# Patient Record
Sex: Female | Born: 1940
Health system: Southern US, Community
[De-identification: ages and names within clinical notes are randomized; demographics above are authoritative.]

## PROBLEM LIST (undated history)

## (undated) DIAGNOSIS — E119 Type 2 diabetes mellitus without complications: Secondary | ICD-10-CM

## (undated) DIAGNOSIS — E109 Type 1 diabetes mellitus without complications: Secondary | ICD-10-CM

## (undated) DIAGNOSIS — I499 Cardiac arrhythmia, unspecified: Secondary | ICD-10-CM

## (undated) DIAGNOSIS — I700 Atherosclerosis of aorta: Secondary | ICD-10-CM

## (undated) DIAGNOSIS — R011 Cardiac murmur, unspecified: Secondary | ICD-10-CM

## (undated) DIAGNOSIS — N183 Chronic kidney disease, stage 3 unspecified: Secondary | ICD-10-CM

## (undated) DIAGNOSIS — F0390 Unspecified dementia without behavioral disturbance: Secondary | ICD-10-CM

## (undated) DIAGNOSIS — M1990 Unspecified osteoarthritis, unspecified site: Secondary | ICD-10-CM

## (undated) DIAGNOSIS — I10 Essential (primary) hypertension: Secondary | ICD-10-CM

## (undated) HISTORY — PX: EYE SURGERY: SHX253

## (undated) HISTORY — PX: BREAST EXCISIONAL BIOPSY: SUR124

## (undated) NOTE — *Deleted (*Deleted)
Chronic Care Management Pharmacy  Name: [name]  MRN: [id number] DOB: [birth year]  Chief Complaint/ HPI  [name],  89 y.o. , female presents for their Follow-Up CCM visit with the clinical pharmacist via telephone due to COVID-19 Pandemic.  PCP : Alba Cory, MD  Their chronic conditions include: Hypertension, Hyperlipidemia, Diabetes, and Osteoporosis  Office Visits:NA  Consult Visit: 10/18/19: Patient presented to Dr. Turner Daniels (Endocrinology) for follow-up. Insulin 70/30 increased to 45 units with breakfast, decreased to 0 units supper. Fasting BG 100s, lunch and supper 200s.   Medications: Outpatient Encounter Medications as of 02/08/2020  Medication Sig Note  . ACCU-CHEK AVIVA PLUS test strip USE 1 STRIP UP TO 4 TIMES DAILY   . alendronate (FOSAMAX) 70 MG tablet TAKE 1 TABLET BY MOUTH EVERY 7 DAYS. TAKE WITH A FULL GLASS OF WATER ON AN EMPTY STOMACH.   Marland Kitchen amLODipine (NORVASC) 5 MG tablet Take 1 tablet (5 mg total) by mouth daily.   Marland Kitchen aspirin EC 81 MG tablet Take 81 mg by mouth daily.   Marland Kitchen atorvastatin (LIPITOR) 80 MG tablet Take 1 tablet (80 mg total) by mouth daily.   . blood glucose meter kit and supplies  09/19/2014: DX: 250.02 DM II Received from: [facility]  . Calcium Carbonate-Vitamin D (CALCIUM-VITAMIN D) 600-125 MG-UNIT TABS Take 1 tablet by mouth daily. 09/19/2014: Received from: [facility] Received Sig: Take by mouth.  . [employer] ALCOHOL SWABS PADS USE 4 TIMES DAILY TO WIPE SKIN BEFORE USING INSULIN   . Glucagon, rDNA, (GLUCAGON EMERGENCY) 1 MG KIT Inject 1 kit as directed daily as needed.   . hydrALAZINE (APRESOLINE) 10 MG tablet TAKE 1 TABLET (10 MG TOTAL) BY MOUTH 3 (THREE) TIMES DAILY AS NEEDED. BP ABOVE 150/90   . insulin aspart protamine- aspart (NOVOLOG MIX 70/30) (70-30) 100 UNIT/ML injection 45 Units daily with breakfast. Pt taking 30 units in AM and 10 units before bed only per Dr. Justin Mend 09/26/2014: Received from:  [facility]  . losartan-hydrochlorothiazide (HYZAAR) 100-12.5 MG tablet TAKE 1 TABLET BY MOUTH EVERY DAY   . ondansetron (ZOFRAN ODT) 4 MG disintegrating tablet Take 1 tablet (4 mg total) by mouth every 8 (eight) hours as needed for nausea or vomiting.    No facility-administered encounter medications on file as of 02/08/2020.     Financial Resource Strain:   . Difficulty of Paying Living Expenses: Not on file     Current Diagnosis/Assessment:  Goals Addressed   None    Hypertension   BP goal is:  {CHL HP UPSTREAM Pharmacist BP ranges:[phone]}  Office blood pressures are  BP Readings from Last 3 Encounters:  09/08/19 138/68  08/02/19 (!) 150/70  05/15/19 (!) 160/90   Patient checks BP at home {CHL HP BP Monitoring Frequency:[phone]} Patient home BP readings are ranging: ***  Patient has failed these meds in the past: *** Patient is currently {CHL Controlled/Uncontrolled:[phone]} on the following medications:  . Amlodipine 5 mg daily  . Hydralazine 10 mg TID PRN  . Losartan 100-12.5 mg daily   We discussed {CHL HP Upstream Pharmacy [phone]}  Plan  Continue {CHL HP Upstream Pharmacy Plans:[phone]}   Hyperlipidemia   LDL goal < 70  Last lipids Lab Results  Component Value Date   CHOL 154 09/08/2019   HDL 47 (L) 09/08/2019   LDLCALC 88 09/08/2019   TRIG 96 09/08/2019   CHOLHDL 3.3 09/08/2019   Hepatic Function Latest Ref Rng & Units 09/08/2019 05/15/2019 11/15/2018  Total  Protein 6.1 - 8.1 g/dL 7.3 7.4 7.2  Albumin 3.6 - 5.1 g/dL - - -  AST 10 - 35 U/L 20 27 24   ALT 6 - 29 U/L 19 23 23   Alk Phosphatase 33 - 130 U/L - - -  Total Bilirubin 0.2 - 1.2 mg/dL 0.6 0.6 0.8     The 40-JWJX ASCVD risk score Denman George DC Jr., et al., 2013) is: 19.8%   Values used to calculate the score:     Age: 20 years     Sex: Female     Is Non-Hispanic African American: Yes     Diabetic: Yes     Tobacco smoker: No     Systolic Blood  Pressure: 102 mmHg     Is BP treated: Yes     HDL Cholesterol: 47 mg/dL     Total Cholesterol: 154 mg/dL   Patient has failed these meds in past: *** Patient is currently {CHL Controlled/Uncontrolled:[phone]} on the following medications:  . Aspirin 81 mg daily  . Atorvastatin 80 mg daily   We discussed:  {CHL HP Upstream Pharmacy discussion:[phone]}  Plan  Continue {CHL HP Upstream Pharmacy [phone]}  Osteoporosis   Last DEXA   Vit D, 25-Hydroxy  Date Value Ref Range Status  09/08/2019 27 (L) 30 - 100 ng/mL Final    Comment:    Vitamin D Status         25-OH Vitamin D: . Deficiency:                    <20 ng/mL Insufficiency:             20 - 29 ng/mL Optimal:                 > or = 30 ng/mL . For 25-OH Vitamin D testing on patients on  D2-supplementation and patients for whom quantitation  of D2 and D3 fractions is required, the QuestAssureD(TM) 25-OH VIT D, (D2,D3), LC/MS/MS is recommended: order  code 91478 (patients >73yrs). See Note 1 . Note 1 . For additional information, please refer to  http://education.Quest[facility].com/faq/FAQ199  (This link is being provided for informational/ educational purposes only.)      Patient is a candidate for pharmacologic treatment due to T-Score < -2.5 in femoral neck  Patient has failed these meds in past: NA Patient is currently controlled on the following medications:  . Fosamax 70mg  weekly (restarted Oct 2019)  . Calcium carbonate +D3 600 mg- 125 units daily   We discussed: Dexa Ordered   Plan  Continue current medications  Diabetes   Recent Relevant Labs: Lab Results  Component Value Date/Time   HGBA1C 8.5 (A) 05/15/2019 01:38 PM   HGBA1C 7.6 (A) 11/15/2018 01:35 PM   HGBA1C 8.5 (A) 04/15/2018 01:22 PM   HGBA1C 9.9 12/15/2017 12:00 AM   HGBA1C 10.6 (A) 04/25/2014 12:00 AM   HGBA1C 14.0 (H) 02/26/2013 10:50 AM   HGBA1C 10.6 (H) 01/23/2012 05:35 PM   MICROALBUR 1.8 03/14/2018 04:53 PM    MICROALBUR 0.8 10/13/2016 09:36 AM    Patient is currently uncontrolled on the following medications: Novolog 70/30 mix  Last diabetic Foot exam:  Lab Results  Component Value Date/Time   HMDIABEYEEXA Retinopathy (A) 02/01/2019 12:00 AM    Last diabetic Eye exam: No results found for: HMDIABFOOTEX   We discussed:    Plan  Continue current medications  Misc / OTC     . Ondanestron ODT 4 mg q8hr PRN   We  discussed:  ***  Plan  Continue {CHL HP Upstream Pharmacy ZOXWR:[id number]}

---

## 1968-03-09 HISTORY — PX: BREAST SURGERY: SHX581

## 2005-02-03 ENCOUNTER — Ambulatory Visit: Payer: Self-pay

## 2006-03-24 ENCOUNTER — Ambulatory Visit: Payer: Self-pay | Admitting: Family Medicine

## 2006-04-13 ENCOUNTER — Ambulatory Visit: Payer: Self-pay | Admitting: Family Medicine

## 2006-06-29 ENCOUNTER — Ambulatory Visit: Payer: Self-pay | Admitting: Family Medicine

## 2006-12-16 ENCOUNTER — Ambulatory Visit: Payer: Self-pay | Admitting: Family Medicine

## 2007-02-24 DIAGNOSIS — E559 Vitamin D deficiency, unspecified: Secondary | ICD-10-CM

## 2008-05-18 IMAGING — US US PELV - US TRANSVAGINAL
1 series · 17 of 25 positions shown · non-contrast
Comparison: none

REASON FOR EXAM: mass on left ovary
COMMENTS:

[Series 1: us pelv - us transvaginal · 17 of 37 slices shown]
[im 1/37]
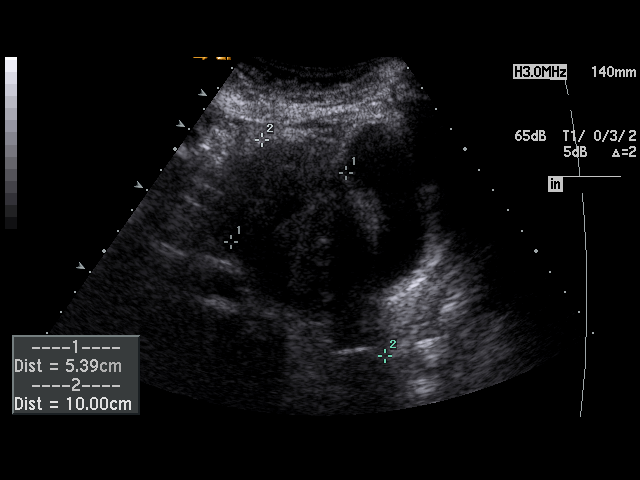
[im 4/37]
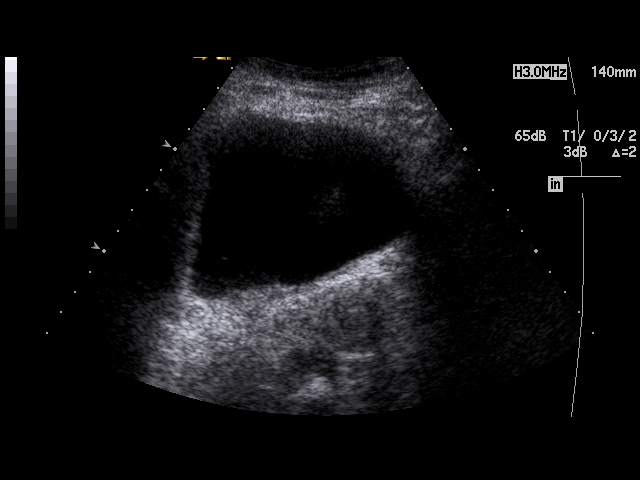
[im 5/37]
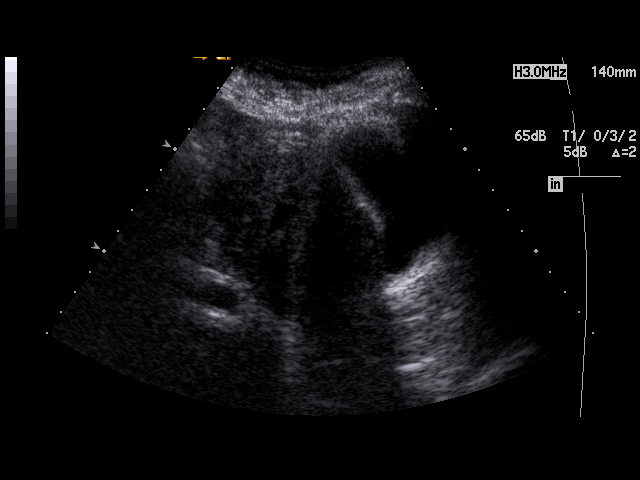
[im 8/37]
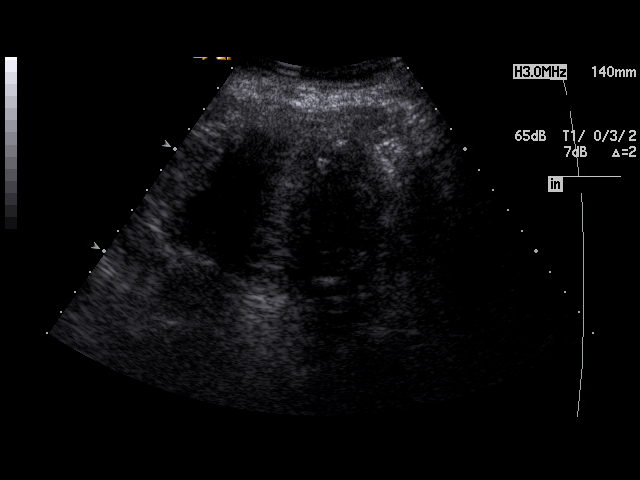
[im 10/37]
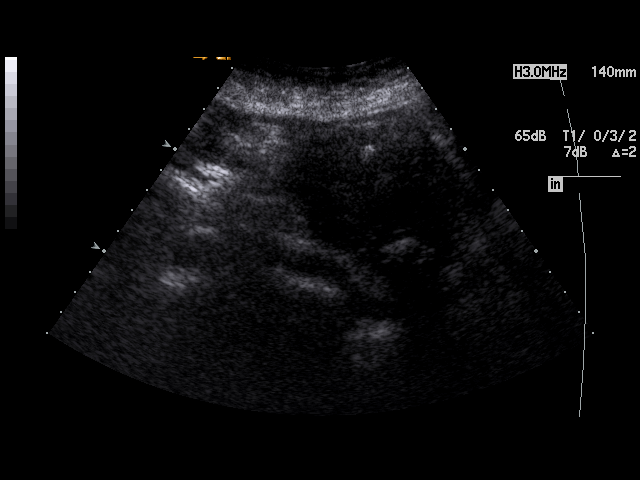
[im 13/37]
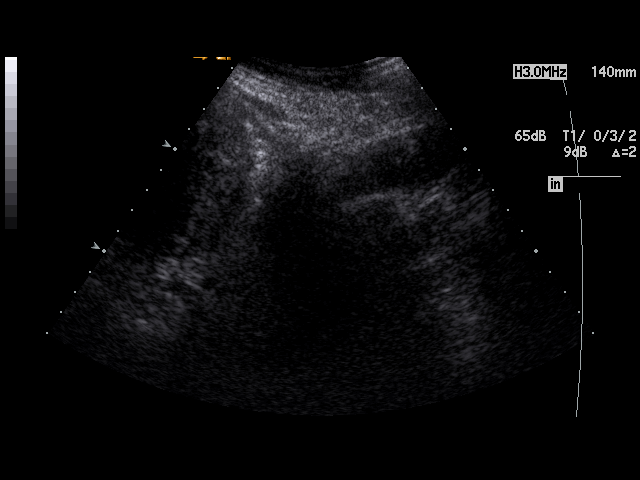
[im 14/37]
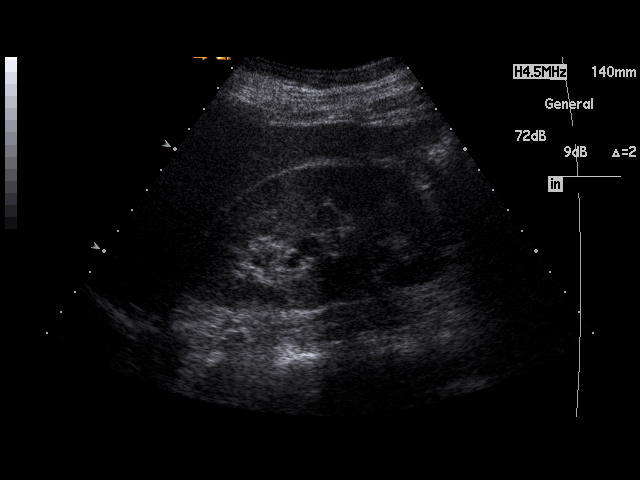
[im 17/37]
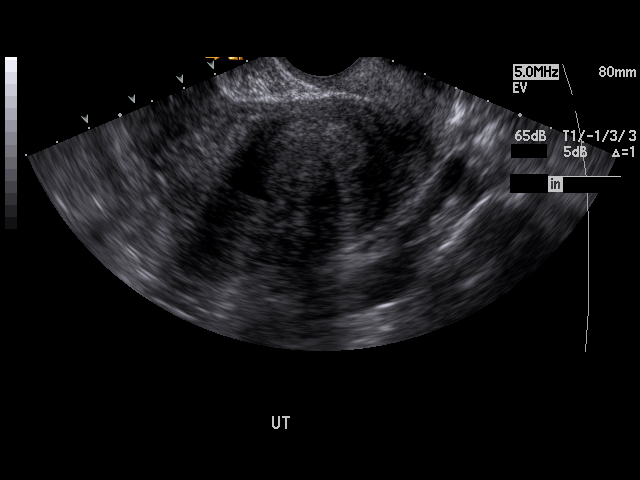
[im 19/37]
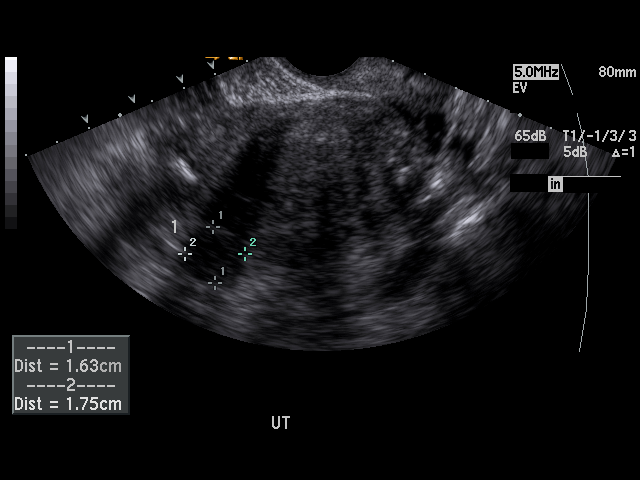
[im 20/37]
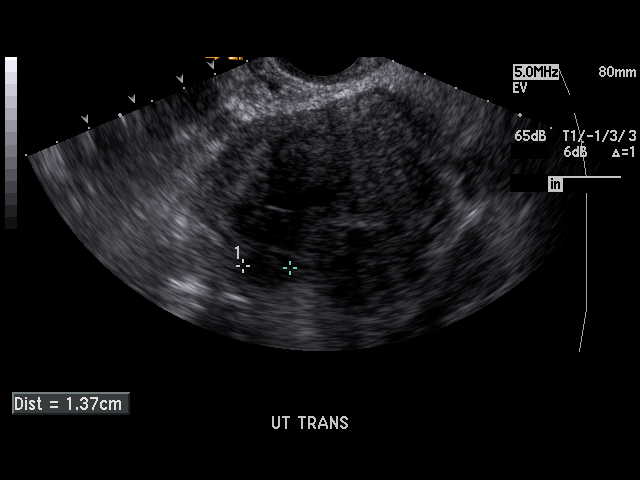
[im 23/37]
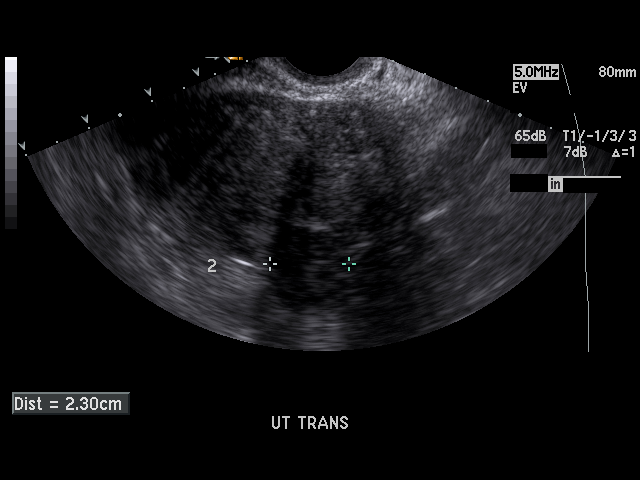
[im 25/37]
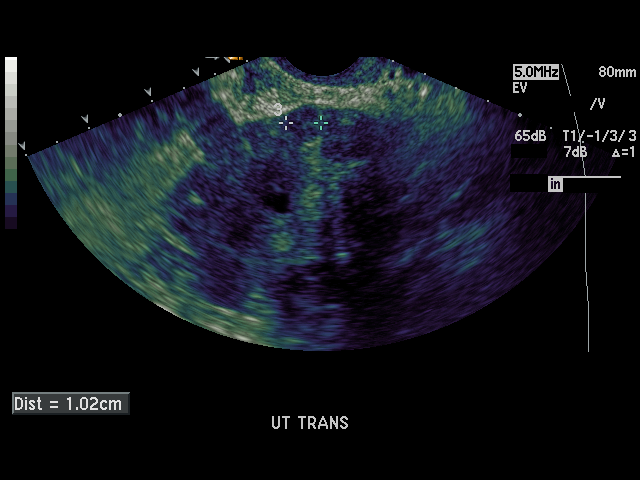
[im 28/37]
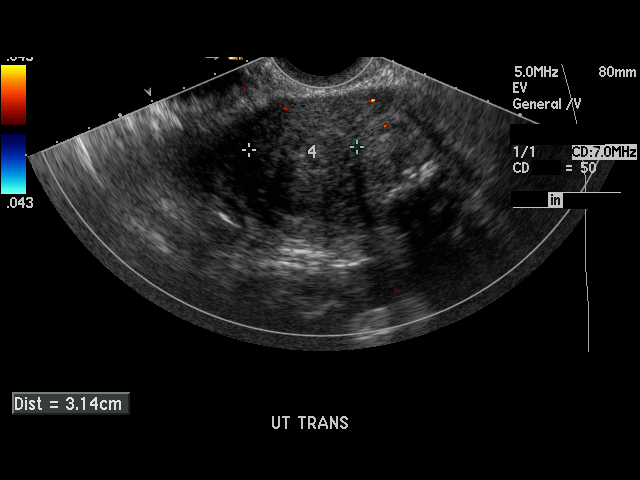
[im 29/37]
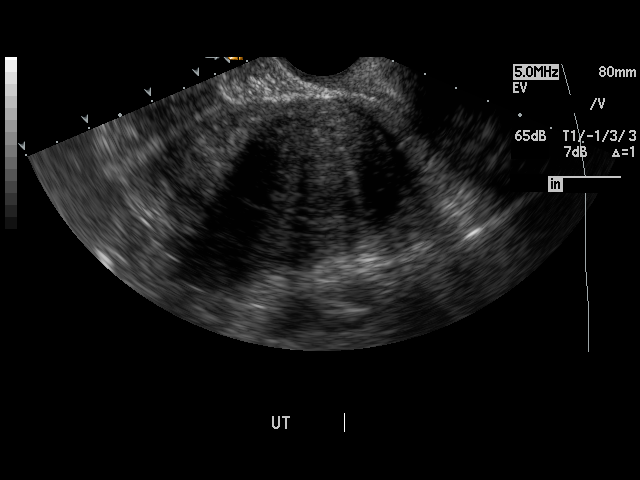
[im 32/37]
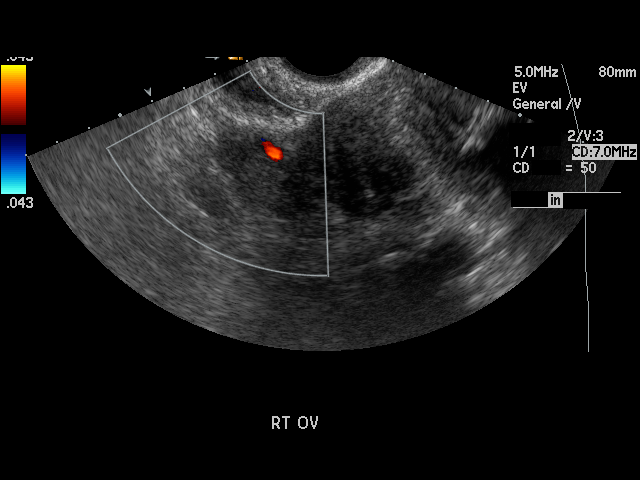
[im 34/37]
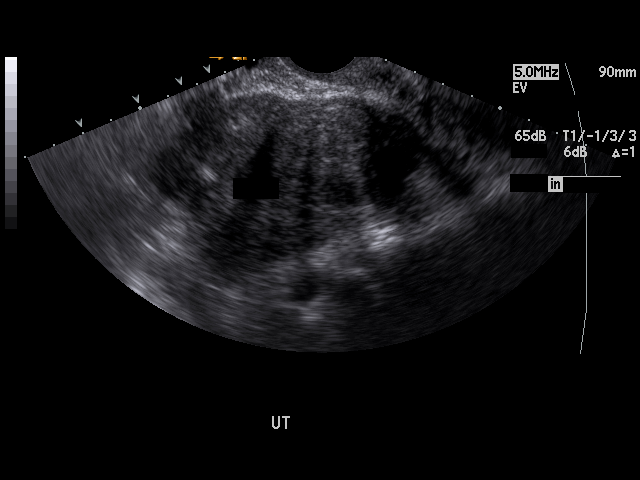
[im 37/37]
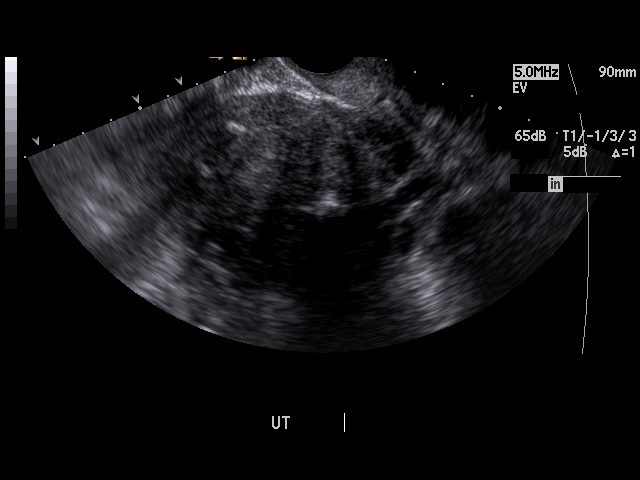

[17 of 25 positions shown; findings below may reference images not displayed]

PROCEDURE:     US  - US PELVIS MASS EXAM  - [DATE] [DATE] [DATE]  [DATE]

RESULT:     Transabdominal and endovaginal ultrasound was performed. The
uterus measures 9.31 cm x 4.73 cm x 6.32 cm.  There are observed at least
four uterine masses consistent with uterine fibroids. The largest measures
3.18 cm in diameter.  The second largest measures 2.74 cm at maximum
diameter and the two smaller lesions measure 1.75 cm and 1.60 cm at maximum
diameters, respectively.  The endometrial stripe is apparently distorted by
the uterine masses and is not definitely identified.  The RIGHT ovary is
visualized and measures 2.78 cm at maximum diameter.  The LEFT ovary is not
seen.  No free fluid is observed in the pelvis.  The kidneys show no
hydronephrosis.
IMPRESSION: 1)There are multiple uterine masses consistent with uterine fibroids as
noted above.

2)The LEFT ovary is not seen.

3)The RIGHT ovary is normal in appearance.

4)No free fluid is observed in the pelvis.

## 2009-02-28 ENCOUNTER — Ambulatory Visit: Payer: Self-pay | Admitting: Ophthalmology

## 2009-03-13 ENCOUNTER — Ambulatory Visit: Payer: Self-pay | Admitting: Ophthalmology

## 2009-04-10 ENCOUNTER — Ambulatory Visit: Payer: Self-pay | Admitting: Family Medicine

## 2009-10-08 ENCOUNTER — Ambulatory Visit: Payer: Self-pay | Admitting: Ophthalmology

## 2009-10-23 ENCOUNTER — Ambulatory Visit: Payer: Self-pay | Admitting: Ophthalmology

## 2010-03-17 ENCOUNTER — Ambulatory Visit: Payer: Self-pay | Admitting: Ophthalmology

## 2010-03-19 ENCOUNTER — Ambulatory Visit: Payer: Self-pay | Admitting: Ophthalmology

## 2010-04-23 LAB — HM DEXA SCAN

## 2010-04-28 ENCOUNTER — Ambulatory Visit: Payer: Self-pay | Admitting: Family Medicine

## 2010-12-17 ENCOUNTER — Ambulatory Visit: Payer: Self-pay | Admitting: Ophthalmology

## 2010-12-23 ENCOUNTER — Ambulatory Visit: Payer: Self-pay | Admitting: Ophthalmology

## 2011-04-23 DIAGNOSIS — I10 Essential (primary) hypertension: Secondary | ICD-10-CM | POA: Diagnosis not present

## 2011-04-23 DIAGNOSIS — E785 Hyperlipidemia, unspecified: Secondary | ICD-10-CM | POA: Diagnosis not present

## 2011-06-22 DIAGNOSIS — E11359 Type 2 diabetes mellitus with proliferative diabetic retinopathy without macular edema: Secondary | ICD-10-CM | POA: Diagnosis not present

## 2011-06-22 DIAGNOSIS — E1139 Type 2 diabetes mellitus with other diabetic ophthalmic complication: Secondary | ICD-10-CM | POA: Diagnosis not present

## 2011-07-15 DIAGNOSIS — E11359 Type 2 diabetes mellitus with proliferative diabetic retinopathy without macular edema: Secondary | ICD-10-CM | POA: Diagnosis not present

## 2011-07-15 DIAGNOSIS — E1139 Type 2 diabetes mellitus with other diabetic ophthalmic complication: Secondary | ICD-10-CM | POA: Diagnosis not present

## 2011-07-22 DIAGNOSIS — E785 Hyperlipidemia, unspecified: Secondary | ICD-10-CM | POA: Diagnosis not present

## 2011-07-22 DIAGNOSIS — I10 Essential (primary) hypertension: Secondary | ICD-10-CM | POA: Diagnosis not present

## 2011-08-04 ENCOUNTER — Ambulatory Visit: Payer: Self-pay | Admitting: Family Medicine

## 2011-08-04 DIAGNOSIS — E119 Type 2 diabetes mellitus without complications: Secondary | ICD-10-CM | POA: Diagnosis not present

## 2011-08-04 DIAGNOSIS — Z7189 Other specified counseling: Secondary | ICD-10-CM | POA: Diagnosis not present

## 2011-08-08 ENCOUNTER — Ambulatory Visit: Payer: Self-pay | Admitting: Family Medicine

## 2011-08-08 DIAGNOSIS — E119 Type 2 diabetes mellitus without complications: Secondary | ICD-10-CM | POA: Diagnosis not present

## 2011-08-08 DIAGNOSIS — Z7189 Other specified counseling: Secondary | ICD-10-CM | POA: Diagnosis not present

## 2011-09-07 ENCOUNTER — Ambulatory Visit: Payer: Self-pay | Admitting: Family Medicine

## 2011-10-29 DIAGNOSIS — M810 Age-related osteoporosis without current pathological fracture: Secondary | ICD-10-CM | POA: Diagnosis not present

## 2011-10-29 DIAGNOSIS — E785 Hyperlipidemia, unspecified: Secondary | ICD-10-CM | POA: Diagnosis not present

## 2011-10-29 DIAGNOSIS — I10 Essential (primary) hypertension: Secondary | ICD-10-CM | POA: Diagnosis not present

## 2011-12-21 DIAGNOSIS — E11359 Type 2 diabetes mellitus with proliferative diabetic retinopathy without macular edema: Secondary | ICD-10-CM | POA: Diagnosis not present

## 2011-12-21 DIAGNOSIS — E1139 Type 2 diabetes mellitus with other diabetic ophthalmic complication: Secondary | ICD-10-CM | POA: Diagnosis not present

## 2012-01-23 ENCOUNTER — Inpatient Hospital Stay: Payer: Self-pay | Admitting: Internal Medicine

## 2012-01-23 DIAGNOSIS — E785 Hyperlipidemia, unspecified: Secondary | ICD-10-CM | POA: Diagnosis present

## 2012-01-23 DIAGNOSIS — Z794 Long term (current) use of insulin: Secondary | ICD-10-CM | POA: Diagnosis not present

## 2012-01-23 DIAGNOSIS — E876 Hypokalemia: Secondary | ICD-10-CM | POA: Diagnosis present

## 2012-01-23 DIAGNOSIS — I10 Essential (primary) hypertension: Secondary | ICD-10-CM | POA: Diagnosis present

## 2012-01-23 DIAGNOSIS — Z23 Encounter for immunization: Secondary | ICD-10-CM | POA: Diagnosis not present

## 2012-01-23 DIAGNOSIS — E1310 Other specified diabetes mellitus with ketoacidosis without coma: Secondary | ICD-10-CM | POA: Diagnosis present

## 2012-01-23 DIAGNOSIS — R5383 Other fatigue: Secondary | ICD-10-CM | POA: Diagnosis not present

## 2012-01-23 DIAGNOSIS — Z7982 Long term (current) use of aspirin: Secondary | ICD-10-CM | POA: Diagnosis not present

## 2012-01-23 DIAGNOSIS — B369 Superficial mycosis, unspecified: Secondary | ICD-10-CM | POA: Diagnosis present

## 2012-01-23 DIAGNOSIS — I959 Hypotension, unspecified: Secondary | ICD-10-CM | POA: Diagnosis not present

## 2012-01-23 DIAGNOSIS — R109 Unspecified abdominal pain: Secondary | ICD-10-CM | POA: Diagnosis not present

## 2012-01-23 DIAGNOSIS — IMO0001 Reserved for inherently not codable concepts without codable children: Secondary | ICD-10-CM | POA: Diagnosis not present

## 2012-01-23 DIAGNOSIS — N179 Acute kidney failure, unspecified: Secondary | ICD-10-CM | POA: Diagnosis not present

## 2012-01-23 DIAGNOSIS — K8020 Calculus of gallbladder without cholecystitis without obstruction: Secondary | ICD-10-CM | POA: Diagnosis present

## 2012-01-23 DIAGNOSIS — R5381 Other malaise: Secondary | ICD-10-CM | POA: Diagnosis not present

## 2012-01-23 DIAGNOSIS — Z452 Encounter for adjustment and management of vascular access device: Secondary | ICD-10-CM | POA: Diagnosis not present

## 2012-01-23 DIAGNOSIS — E1110 Type 2 diabetes mellitus with ketoacidosis without coma: Secondary | ICD-10-CM | POA: Diagnosis not present

## 2012-01-23 DIAGNOSIS — R578 Other shock: Secondary | ICD-10-CM | POA: Diagnosis present

## 2012-01-23 LAB — URINALYSIS, COMPLETE
Bacteria: NONE SEEN
Bilirubin,UR: NEGATIVE
Blood: NEGATIVE
Glucose,UR: >=500 mg/dL (ref 0–75)
Ph: 5 (ref 4.5–8.0)
Specific Gravity: 1.02 (ref 1.003–1.030)
WBC UR: 5 /HPF (ref 0–5)

## 2012-01-23 LAB — COMPREHENSIVE METABOLIC PANEL
Albumin: 3.7 g/dL (ref 3.4–5.0)
Alkaline Phosphatase: 54 U/L (ref 50–136)
Anion Gap: 22 — ABNORMAL HIGH (ref 7–16)
Bilirubin,Total: 1 mg/dL (ref 0.2–1.0)
Co2: 16 mmol/L — ABNORMAL LOW (ref 21–32)
Creatinine: 1.53 mg/dL — ABNORMAL HIGH (ref 0.60–1.30)
Glucose: 852 mg/dL (ref 65–99)
Osmolality: 333 (ref 275–301)
SGOT(AST): 33 U/L (ref 15–37)
Sodium: 138 mmol/L (ref 136–145)
Total Protein: 6.9 g/dL (ref 6.4–8.2)

## 2012-01-23 LAB — CBC WITH DIFFERENTIAL/PLATELET
Basophil %: 0.1 %
Eosinophil #: 0 10*3/uL (ref 0.0–0.7)
HCT: 38 % (ref 35.0–47.0)
MCHC: 31.3 g/dL — ABNORMAL LOW (ref 32.0–36.0)
MCV: 96 fL (ref 80–100)
Monocyte #: 0.4 x10 3/mm (ref 0.2–0.9)
Monocyte %: 6.3 %
Neutrophil %: 82.3 %
Platelet: 169 10*3/uL (ref 150–440)
RBC: 3.96 10*6/uL (ref 3.80–5.20)
RDW: 14.8 % — ABNORMAL HIGH (ref 11.5–14.5)
WBC: 6.4 10*3/uL (ref 3.6–11.0)

## 2012-01-23 LAB — HEMOGLOBIN A1C: Hemoglobin A1C: 10.6 % — ABNORMAL HIGH (ref 4.2–6.3)

## 2012-01-24 DIAGNOSIS — R109 Unspecified abdominal pain: Secondary | ICD-10-CM | POA: Diagnosis not present

## 2012-01-24 DIAGNOSIS — E1110 Type 2 diabetes mellitus with ketoacidosis without coma: Secondary | ICD-10-CM | POA: Diagnosis not present

## 2012-01-24 DIAGNOSIS — R578 Other shock: Secondary | ICD-10-CM | POA: Diagnosis not present

## 2012-01-24 LAB — COMPREHENSIVE METABOLIC PANEL
BUN: 48 mg/dL — ABNORMAL HIGH (ref 7–18)
Calcium, Total: 9.1 mg/dL (ref 8.5–10.1)
Chloride: 112 mmol/L — ABNORMAL HIGH (ref 98–107)
Co2: 21 mmol/L (ref 21–32)
EGFR (African American): 44 — ABNORMAL LOW
EGFR (Non-African Amer.): 38 — ABNORMAL LOW
Glucose: 439 mg/dL — ABNORMAL HIGH (ref 65–99)
SGOT(AST): 71 U/L — ABNORMAL HIGH (ref 15–37)
SGPT (ALT): 65 U/L (ref 12–78)
Sodium: 147 mmol/L — ABNORMAL HIGH (ref 136–145)

## 2012-01-24 LAB — BASIC METABOLIC PANEL
Anion Gap: 9 (ref 7–16)
BUN: 38 mg/dL — ABNORMAL HIGH (ref 7–18)
Co2: 24 mmol/L (ref 21–32)
Creatinine: 1.23 mg/dL (ref 0.60–1.30)
EGFR (African American): 52 — ABNORMAL LOW
EGFR (Non-African Amer.): 45 — ABNORMAL LOW
Glucose: 222 mg/dL — ABNORMAL HIGH (ref 65–99)
Sodium: 147 mmol/L — ABNORMAL HIGH (ref 136–145)

## 2012-01-24 LAB — CBC WITH DIFFERENTIAL/PLATELET
Basophil #: 0 10*3/uL (ref 0.0–0.1)
Basophil %: 0.1 %
Eosinophil #: 0 10*3/uL (ref 0.0–0.7)
Lymphocyte #: 0.9 10*3/uL — ABNORMAL LOW (ref 1.0–3.6)
Lymphocyte %: 11.6 %
MCH: 30.1 pg (ref 26.0–34.0)
MCHC: 32.9 g/dL (ref 32.0–36.0)
MCV: 92 fL (ref 80–100)
Monocyte #: 0.6 x10 3/mm (ref 0.2–0.9)
Neutrophil #: 6.4 10*3/uL (ref 1.4–6.5)
Neutrophil %: 80.5 %
Platelet: 161 10*3/uL (ref 150–440)
RBC: 3.6 10*6/uL — ABNORMAL LOW (ref 3.80–5.20)
RDW: 14.4 % (ref 11.5–14.5)

## 2012-01-24 LAB — MAGNESIUM: Magnesium: 2.1 mg/dL

## 2012-01-24 LAB — PHOSPHORUS: Phosphorus: 1.4 mg/dL — ABNORMAL LOW (ref 2.5–4.9)

## 2012-01-24 LAB — POTASSIUM: Potassium: 4.9 mmol/L (ref 3.5–5.1)

## 2012-01-25 DIAGNOSIS — R109 Unspecified abdominal pain: Secondary | ICD-10-CM | POA: Diagnosis not present

## 2012-01-25 DIAGNOSIS — E1110 Type 2 diabetes mellitus with ketoacidosis without coma: Secondary | ICD-10-CM | POA: Diagnosis not present

## 2012-01-25 DIAGNOSIS — R578 Other shock: Secondary | ICD-10-CM | POA: Diagnosis not present

## 2012-01-25 LAB — BASIC METABOLIC PANEL
Anion Gap: 3 — ABNORMAL LOW (ref 7–16)
BUN: 19 mg/dL — ABNORMAL HIGH (ref 7–18)
Calcium, Total: 8.5 mg/dL (ref 8.5–10.1)
Chloride: 107 mmol/L (ref 98–107)
Co2: 30 mmol/L (ref 21–32)
Glucose: 301 mg/dL — ABNORMAL HIGH (ref 65–99)
Osmolality: 293 (ref 275–301)
Potassium: 3.9 mmol/L (ref 3.5–5.1)

## 2012-01-25 LAB — URINE CULTURE

## 2012-01-28 DIAGNOSIS — Z09 Encounter for follow-up examination after completed treatment for conditions other than malignant neoplasm: Secondary | ICD-10-CM | POA: Diagnosis not present

## 2012-01-28 DIAGNOSIS — L089 Local infection of the skin and subcutaneous tissue, unspecified: Secondary | ICD-10-CM | POA: Diagnosis not present

## 2012-01-28 DIAGNOSIS — E860 Dehydration: Secondary | ICD-10-CM | POA: Diagnosis not present

## 2012-01-28 DIAGNOSIS — E1110 Type 2 diabetes mellitus with ketoacidosis without coma: Secondary | ICD-10-CM | POA: Diagnosis not present

## 2012-01-29 DIAGNOSIS — E11359 Type 2 diabetes mellitus with proliferative diabetic retinopathy without macular edema: Secondary | ICD-10-CM | POA: Diagnosis not present

## 2012-01-29 DIAGNOSIS — E119 Type 2 diabetes mellitus without complications: Secondary | ICD-10-CM | POA: Diagnosis not present

## 2012-01-29 LAB — CULTURE, BLOOD (SINGLE)

## 2012-02-11 DIAGNOSIS — I10 Essential (primary) hypertension: Secondary | ICD-10-CM | POA: Diagnosis not present

## 2012-02-11 DIAGNOSIS — R413 Other amnesia: Secondary | ICD-10-CM | POA: Diagnosis not present

## 2012-03-03 DIAGNOSIS — E162 Hypoglycemia, unspecified: Secondary | ICD-10-CM | POA: Diagnosis not present

## 2012-03-08 DIAGNOSIS — G3184 Mild cognitive impairment, so stated: Secondary | ICD-10-CM | POA: Diagnosis not present

## 2012-03-08 DIAGNOSIS — G5600 Carpal tunnel syndrome, unspecified upper limb: Secondary | ICD-10-CM | POA: Diagnosis not present

## 2012-03-08 DIAGNOSIS — H02409 Unspecified ptosis of unspecified eyelid: Secondary | ICD-10-CM | POA: Diagnosis not present

## 2012-03-14 ENCOUNTER — Ambulatory Visit: Payer: Self-pay | Admitting: Neurology

## 2012-03-14 DIAGNOSIS — G3184 Mild cognitive impairment, so stated: Secondary | ICD-10-CM | POA: Diagnosis not present

## 2012-03-14 DIAGNOSIS — Q048 Other specified congenital malformations of brain: Secondary | ICD-10-CM | POA: Diagnosis not present

## 2012-04-05 DIAGNOSIS — E162 Hypoglycemia, unspecified: Secondary | ICD-10-CM | POA: Diagnosis not present

## 2012-04-05 DIAGNOSIS — E1065 Type 1 diabetes mellitus with hyperglycemia: Secondary | ICD-10-CM | POA: Diagnosis not present

## 2012-04-18 DIAGNOSIS — Z1211 Encounter for screening for malignant neoplasm of colon: Secondary | ICD-10-CM | POA: Diagnosis not present

## 2012-04-18 DIAGNOSIS — I10 Essential (primary) hypertension: Secondary | ICD-10-CM | POA: Diagnosis not present

## 2012-04-18 DIAGNOSIS — R413 Other amnesia: Secondary | ICD-10-CM | POA: Diagnosis not present

## 2012-04-18 DIAGNOSIS — E785 Hyperlipidemia, unspecified: Secondary | ICD-10-CM | POA: Diagnosis not present

## 2012-05-04 DIAGNOSIS — D72819 Decreased white blood cell count, unspecified: Secondary | ICD-10-CM | POA: Diagnosis not present

## 2012-05-04 DIAGNOSIS — E785 Hyperlipidemia, unspecified: Secondary | ICD-10-CM | POA: Diagnosis not present

## 2012-05-04 DIAGNOSIS — I10 Essential (primary) hypertension: Secondary | ICD-10-CM | POA: Diagnosis not present

## 2012-05-10 DIAGNOSIS — I10 Essential (primary) hypertension: Secondary | ICD-10-CM | POA: Diagnosis not present

## 2012-05-10 DIAGNOSIS — E7800 Pure hypercholesterolemia, unspecified: Secondary | ICD-10-CM | POA: Diagnosis not present

## 2012-07-28 DIAGNOSIS — Z1239 Encounter for other screening for malignant neoplasm of breast: Secondary | ICD-10-CM | POA: Diagnosis not present

## 2012-07-28 DIAGNOSIS — R630 Anorexia: Secondary | ICD-10-CM | POA: Diagnosis not present

## 2012-07-28 DIAGNOSIS — R634 Abnormal weight loss: Secondary | ICD-10-CM | POA: Diagnosis not present

## 2012-07-29 DIAGNOSIS — H35379 Puckering of macula, unspecified eye: Secondary | ICD-10-CM | POA: Diagnosis not present

## 2012-08-11 ENCOUNTER — Ambulatory Visit: Payer: Self-pay | Admitting: Gastroenterology

## 2012-08-11 DIAGNOSIS — Z79899 Other long term (current) drug therapy: Secondary | ICD-10-CM | POA: Diagnosis not present

## 2012-08-11 DIAGNOSIS — Z880 Allergy status to penicillin: Secondary | ICD-10-CM | POA: Diagnosis not present

## 2012-08-11 DIAGNOSIS — K621 Rectal polyp: Secondary | ICD-10-CM | POA: Diagnosis not present

## 2012-08-11 DIAGNOSIS — Z1211 Encounter for screening for malignant neoplasm of colon: Secondary | ICD-10-CM | POA: Diagnosis not present

## 2012-08-11 DIAGNOSIS — Z7982 Long term (current) use of aspirin: Secondary | ICD-10-CM | POA: Diagnosis not present

## 2012-08-11 DIAGNOSIS — E119 Type 2 diabetes mellitus without complications: Secondary | ICD-10-CM | POA: Diagnosis not present

## 2012-08-11 DIAGNOSIS — K6389 Other specified diseases of intestine: Secondary | ICD-10-CM | POA: Diagnosis not present

## 2012-08-11 DIAGNOSIS — Z794 Long term (current) use of insulin: Secondary | ICD-10-CM | POA: Diagnosis not present

## 2012-08-11 DIAGNOSIS — D126 Benign neoplasm of colon, unspecified: Secondary | ICD-10-CM | POA: Diagnosis not present

## 2012-08-11 DIAGNOSIS — Z1212 Encounter for screening for malignant neoplasm of rectum: Secondary | ICD-10-CM | POA: Diagnosis not present

## 2012-08-11 LAB — HM COLONOSCOPY: HM Colonoscopy: ABNORMAL

## 2012-08-12 LAB — PATHOLOGY REPORT

## 2012-11-21 DIAGNOSIS — Z1151 Encounter for screening for human papillomavirus (HPV): Secondary | ICD-10-CM | POA: Diagnosis not present

## 2012-11-21 DIAGNOSIS — Z0000 Encounter for general adult medical examination without abnormal findings: Secondary | ICD-10-CM | POA: Diagnosis not present

## 2012-11-21 DIAGNOSIS — Z124 Encounter for screening for malignant neoplasm of cervix: Secondary | ICD-10-CM | POA: Diagnosis not present

## 2012-11-21 DIAGNOSIS — Z23 Encounter for immunization: Secondary | ICD-10-CM | POA: Diagnosis not present

## 2012-11-21 DIAGNOSIS — Z1239 Encounter for other screening for malignant neoplasm of breast: Secondary | ICD-10-CM | POA: Diagnosis not present

## 2012-11-21 DIAGNOSIS — R634 Abnormal weight loss: Secondary | ICD-10-CM | POA: Diagnosis not present

## 2012-11-21 DIAGNOSIS — R630 Anorexia: Secondary | ICD-10-CM | POA: Diagnosis not present

## 2012-11-21 DIAGNOSIS — R413 Other amnesia: Secondary | ICD-10-CM | POA: Diagnosis not present

## 2012-11-21 LAB — HM PAP SMEAR: HM Pap smear: NORMAL

## 2012-11-22 DIAGNOSIS — E119 Type 2 diabetes mellitus without complications: Secondary | ICD-10-CM | POA: Diagnosis not present

## 2012-11-22 DIAGNOSIS — E1065 Type 1 diabetes mellitus with hyperglycemia: Secondary | ICD-10-CM | POA: Diagnosis not present

## 2012-11-29 DIAGNOSIS — R634 Abnormal weight loss: Secondary | ICD-10-CM | POA: Diagnosis not present

## 2012-12-05 DIAGNOSIS — I10 Essential (primary) hypertension: Secondary | ICD-10-CM | POA: Diagnosis not present

## 2012-12-05 DIAGNOSIS — E785 Hyperlipidemia, unspecified: Secondary | ICD-10-CM | POA: Diagnosis not present

## 2013-01-11 ENCOUNTER — Ambulatory Visit: Payer: Self-pay | Admitting: Family Medicine

## 2013-01-11 DIAGNOSIS — Z1231 Encounter for screening mammogram for malignant neoplasm of breast: Secondary | ICD-10-CM | POA: Diagnosis not present

## 2013-01-11 LAB — HM MAMMOGRAPHY: HM MAMMO: NORMAL

## 2013-01-27 DIAGNOSIS — H3582 Retinal ischemia: Secondary | ICD-10-CM | POA: Diagnosis not present

## 2013-01-31 DIAGNOSIS — R634 Abnormal weight loss: Secondary | ICD-10-CM | POA: Diagnosis not present

## 2013-02-25 ENCOUNTER — Emergency Department: Payer: Self-pay | Admitting: Emergency Medicine

## 2013-02-25 ENCOUNTER — Inpatient Hospital Stay (HOSPITAL_COMMUNITY)
Admission: EM | Admit: 2013-02-25 | Discharge: 2013-02-27 | DRG: 639 | Disposition: A | Discharge status: Home Health | Payer: Medicare Other | Source: Other Acute Inpatient Hospital | Attending: Internal Medicine | Admitting: Internal Medicine

## 2013-02-25 DIAGNOSIS — E1110 Type 2 diabetes mellitus with ketoacidosis without coma: Secondary | ICD-10-CM

## 2013-02-25 DIAGNOSIS — E1310 Other specified diabetes mellitus with ketoacidosis without coma: Secondary | ICD-10-CM | POA: Diagnosis not present

## 2013-02-25 DIAGNOSIS — E876 Hypokalemia: Secondary | ICD-10-CM

## 2013-02-25 DIAGNOSIS — E1010 Type 1 diabetes mellitus with ketoacidosis without coma: Secondary | ICD-10-CM | POA: Diagnosis present

## 2013-02-25 DIAGNOSIS — I10 Essential (primary) hypertension: Secondary | ICD-10-CM

## 2013-02-25 DIAGNOSIS — R0602 Shortness of breath: Secondary | ICD-10-CM | POA: Diagnosis not present

## 2013-02-25 DIAGNOSIS — R5381 Other malaise: Secondary | ICD-10-CM | POA: Diagnosis not present

## 2013-02-25 DIAGNOSIS — Z794 Long term (current) use of insulin: Secondary | ICD-10-CM | POA: Diagnosis not present

## 2013-02-25 DIAGNOSIS — Z79899 Other long term (current) drug therapy: Secondary | ICD-10-CM | POA: Diagnosis not present

## 2013-02-25 DIAGNOSIS — F0390 Unspecified dementia without behavioral disturbance: Secondary | ICD-10-CM

## 2013-02-25 DIAGNOSIS — Z7982 Long term (current) use of aspirin: Secondary | ICD-10-CM

## 2013-02-25 DIAGNOSIS — E860 Dehydration: Secondary | ICD-10-CM | POA: Diagnosis present

## 2013-02-25 HISTORY — DX: Cardiac arrhythmia, unspecified: I49.9

## 2013-02-25 HISTORY — DX: Cardiac murmur, unspecified: R01.1

## 2013-02-25 HISTORY — DX: Type 2 diabetes mellitus without complications: E11.9

## 2013-02-25 HISTORY — DX: Unspecified dementia, unspecified severity, without behavioral disturbance, psychotic disturbance, mood disturbance, and anxiety: F03.90

## 2013-02-25 HISTORY — DX: Essential (primary) hypertension: I10

## 2013-02-25 LAB — CBC
HCT: 45 % (ref 35.0–47.0)
MCH: 30.3 pg (ref 26.0–34.0)
MCV: 94 fL (ref 80–100)
RDW: 14.6 % — ABNORMAL HIGH (ref 11.5–14.5)

## 2013-02-25 LAB — LIPASE, BLOOD: Lipase: 83 U/L (ref 73–393)

## 2013-02-25 LAB — COMPREHENSIVE METABOLIC PANEL
Calcium, Total: 10.3 mg/dL — ABNORMAL HIGH (ref 8.5–10.1)
EGFR (African American): 58 — ABNORMAL LOW
Glucose: 582 mg/dL (ref 65–99)
Potassium: 5 mmol/L (ref 3.5–5.1)
SGOT(AST): 31 U/L (ref 15–37)
Sodium: 130 mmol/L — ABNORMAL LOW (ref 136–145)

## 2013-02-25 LAB — URINALYSIS, COMPLETE
Blood: NEGATIVE
Glucose,UR: >=500 mg/dL (ref 0–75)
Nitrite: NEGATIVE
Protein: NEGATIVE
Specific Gravity: 1.024 (ref 1.003–1.030)
WBC UR: 1 /HPF (ref 0–5)

## 2013-02-25 LAB — TROPONIN I: Troponin-I: <0.02 ng/mL

## 2013-02-26 ENCOUNTER — Encounter (HOSPITAL_COMMUNITY): Payer: Self-pay | Admitting: *Deleted

## 2013-02-26 DIAGNOSIS — F0390 Unspecified dementia without behavioral disturbance: Secondary | ICD-10-CM

## 2013-02-26 DIAGNOSIS — I10 Essential (primary) hypertension: Secondary | ICD-10-CM

## 2013-02-26 DIAGNOSIS — E1110 Type 2 diabetes mellitus with ketoacidosis without coma: Secondary | ICD-10-CM | POA: Diagnosis not present

## 2013-02-26 DIAGNOSIS — E876 Hypokalemia: Secondary | ICD-10-CM | POA: Diagnosis not present

## 2013-02-26 LAB — COMPREHENSIVE METABOLIC PANEL
ALT: 19 U/L (ref 0–35)
BUN: 20 mg/dL (ref 6–23)
CO2: 28 mEq/L (ref 19–32)
Calcium: 8.9 mg/dL (ref 8.4–10.5)
Chloride: 106 mEq/L (ref 96–112)
Creatinine, Ser: 0.53 mg/dL (ref 0.50–1.10)
GFR calc Af Amer: >90 mL/min (ref >90)
GFR calc non Af Amer: >90 mL/min (ref >90)
Glucose, Bld: 73 mg/dL (ref 70–99)
Sodium: 143 mEq/L (ref 135–145)
Total Bilirubin: 0.6 mg/dL (ref 0.3–1.2)

## 2013-02-26 LAB — CBC WITH DIFFERENTIAL/PLATELET
Eosinophils Absolute: 0 10*3/uL (ref 0.0–0.7)
Eosinophils Relative: 0 % (ref 0–5)
HCT: 36.6 % (ref 36.0–46.0)
Lymphocytes Relative: 31 % (ref 12–46)
Lymphs Abs: 1.8 10*3/uL (ref 0.7–4.0)
MCH: 30.6 pg (ref 26.0–34.0)
MCV: 89.5 fL (ref 78.0–100.0)
Monocytes Absolute: 0.6 10*3/uL (ref 0.1–1.0)
Monocytes Relative: 10 % (ref 3–12)
RBC: 4.09 MIL/uL (ref 3.87–5.11)
WBC: 5.9 10*3/uL (ref 4.0–10.5)

## 2013-02-26 LAB — BASIC METABOLIC PANEL
BUN: 21 mg/dL (ref 6–23)
BUN: 20 mg/dL (ref 6–23)
CO2: 31 mEq/L (ref 19–32)
CO2: 29 mEq/L (ref 19–32)
CO2: 27 mEq/L (ref 19–32)
Calcium: 9 mg/dL (ref 8.4–10.5)
Chloride: 104 mEq/L (ref 96–112)
Chloride: 106 mEq/L (ref 96–112)
Chloride: 106 mEq/L (ref 96–112)
Creatinine, Ser: 0.5 mg/dL (ref 0.50–1.10)
GFR calc Af Amer: >90 mL/min (ref >90)
GFR calc non Af Amer: >90 mL/min (ref >90)
GFR calc non Af Amer: >90 mL/min (ref >90)
GFR calc non Af Amer: >90 mL/min (ref >90)
Glucose, Bld: 123 mg/dL — ABNORMAL HIGH (ref 70–99)
Glucose, Bld: 82 mg/dL (ref 70–99)
Glucose, Bld: 46 mg/dL — ABNORMAL LOW (ref 70–99)
Potassium: 3.3 mEq/L — ABNORMAL LOW (ref 3.5–5.1)
Potassium: 3.4 mEq/L — ABNORMAL LOW (ref 3.5–5.1)
Sodium: 143 mEq/L (ref 135–145)
Sodium: 143 mEq/L (ref 135–145)

## 2013-02-26 LAB — GLUCOSE, CAPILLARY
Glucose-Capillary: 117 mg/dL — ABNORMAL HIGH (ref 70–99)
Glucose-Capillary: 174 mg/dL — ABNORMAL HIGH (ref 70–99)
Glucose-Capillary: 230 mg/dL — ABNORMAL HIGH (ref 70–99)
Glucose-Capillary: 313 mg/dL — ABNORMAL HIGH (ref 70–99)

## 2013-02-26 LAB — MRSA PCR SCREENING: MRSA by PCR: NEGATIVE

## 2013-02-26 MED ORDER — INSULIN ASPART 100 UNIT/ML ~~LOC~~ SOLN
0.0000 [IU] | Freq: Every day | SUBCUTANEOUS | Status: DC
  Administered 2013-02-26: 2 [IU] via SUBCUTANEOUS

## 2013-02-26 MED ORDER — DEXTROSE 50 % IV SOLN
25.0000 mL | INTRAVENOUS | Status: DC | PRN
  Administered 2013-02-26: 15 mL via INTRAVENOUS
  Filled 2013-02-26: qty 50

## 2013-02-26 MED ORDER — DEXTROSE-NACL 5-0.45 % IV SOLN
INTRAVENOUS | Status: DC
  Administered 2013-02-26: 02:00:00 via INTRAVENOUS

## 2013-02-26 MED ORDER — INSULIN ASPART 100 UNIT/ML ~~LOC~~ SOLN
0.0000 [IU] | Freq: Three times a day (TID) | SUBCUTANEOUS | Status: DC
  Administered 2013-02-26 (×2): 11 [IU] via SUBCUTANEOUS

## 2013-02-26 MED ORDER — INSULIN ASPART 100 UNIT/ML ~~LOC~~ SOLN: 0.0000 [IU] | Freq: Three times a day (TID) | SUBCUTANEOUS | Status: DC

## 2013-02-26 MED ORDER — GLUCERNA SHAKE PO LIQD
237.0000 mL | Freq: Two times a day (BID) | ORAL | Status: DC
  Administered 2013-02-26 – 2013-02-27 (×3): 237 mL via ORAL

## 2013-02-26 MED ORDER — SODIUM CHLORIDE 0.9 % IV SOLN
INTRAVENOUS | Status: DC
  Administered 2013-02-26: 02:00:00 via INTRAVENOUS
  Filled 2013-02-26: qty 1

## 2013-02-26 MED ORDER — ENOXAPARIN SODIUM 40 MG/0.4ML ~~LOC~~ SOLN
40.0000 mg | SUBCUTANEOUS | Status: DC
  Administered 2013-02-26: 40 mg via SUBCUTANEOUS
  Filled 2013-02-26 (×3): qty 0.4

## 2013-02-26 MED ORDER — ASPIRIN EC 81 MG PO TBEC
81.0000 mg | DELAYED_RELEASE_TABLET | Freq: Every day | ORAL | Status: DC
  Administered 2013-02-26 – 2013-02-27 (×2): 81 mg via ORAL
  Filled 2013-02-26 (×3): qty 1

## 2013-02-26 MED ORDER — POTASSIUM CHLORIDE 10 MEQ/100ML IV SOLN
10.0000 meq | INTRAVENOUS | Status: AC
  Administered 2013-02-26 (×2): 10 meq via INTRAVENOUS
  Filled 2013-02-26 (×2): qty 100

## 2013-02-26 MED ORDER — INSULIN GLARGINE 100 UNIT/ML ~~LOC~~ SOLN
5.0000 [IU] | Freq: Every day | SUBCUTANEOUS | Status: DC
  Administered 2013-02-26: 5 [IU] via SUBCUTANEOUS
  Filled 2013-02-26: qty 0.05

## 2013-02-26 MED ORDER — INSULIN GLARGINE 100 UNIT/ML ~~LOC~~ SOLN
8.0000 [IU] | Freq: Once | SUBCUTANEOUS | Status: AC
  Administered 2013-02-26: 8 [IU] via SUBCUTANEOUS
  Filled 2013-02-26: qty 0.08

## 2013-02-26 MED ORDER — SODIUM CHLORIDE 0.9 % IV SOLN
INTRAVENOUS | Status: DC
  Administered 2013-02-26 (×2): via INTRAVENOUS

## 2013-02-26 NOTE — Plan of Care (Signed)
Problem: Phase I Progression Outcomes Goal: Pain controlled with appropriate interventions Outcome: Progressing Patient denies pain Goal: OOB as tolerated unless otherwise ordered Outcome: Progressing BSC Goal: Voiding-avoid urinary catheter unless indicated Outcome: Progressing Pt incont of urine Goal: Hemodynamically stable Outcome: Progressing VSS

## 2013-02-26 NOTE — Progress Notes (Signed)
[facility] PROGRESS NOTE  Dallas Scorsone  VHQ:[id number]  DOB: 1940-12-28  DOS: 02/26/2013  PCP: Tommy Rainwater, MD Assessment and Plan: Patient's glucose as improved significantly. She does not appear to have any nausea. Anion gap is closed for 2 consecutive BMP. Patient has been given subcutaneous Lantus 10 units and we'll transition her to sliding scale, Gradually increase her diet as tolerated. Transfer to telemetry floor. Use IV hydration at 75 cc.  Author: Lynden Oxford, MD [facility] Pager: [phone] 02/26/2013 3:58 AM   If 7PM-7AM, please contact night-coverage at [email or web address], password [facility]

## 2013-02-26 NOTE — H&P (Signed)
[facility] History and Physical  Patient: [name]  ZOX:[id number]  DOB: [birth year]  DOS: the patient was seen and examined on 02/26/2013 PCP: Tommy Rainwater, MD  Chief Complaint: Generalized weakness  HPI: [name] is a 72 y.o. female with Past medical history of diabetes mellitus, hypertension, dementia, heart murmur. The patient is coming from home transfer from [facility]. The patient presented to [facility] with the complaint of generalized weakness. She mentions that this has been ongoing since last one week. There is also poor appetite and increased urination. She mentions she has been checking on and off her blood sugars and they have been running in 500s since last 5 days. As per the daughter the patient does not remember taking her insulin and does not remember checking her blood glucose 4 times a day. The patient denies any complaint of fever, chills, headache, cough, chest pain, palpitation, shortness of breath, orthopnea, PND, nausea, vomiting, abdominal pain, diarrhea, constipation, active bleeding, burning urination, dizziness, pedal edema,  focal neurological deficit.  Review of Systems: as mentioned in the history of present illness.  A Comprehensive review of the other systems is negative.  Past Medical History  Diagnosis Date  . Hypertension   . Dysrhythmia   . Dementia   . Diabetes mellitus without complication   . Heart murmur    Past Surgical History  Procedure Laterality Date  . Eye surgery     Social History:  reports that she has never smoked. She has never used smokeless tobacco. She reports that she does not drink alcohol. Her drug history is not on file. Independent for most of her  ADL.  Allergies  Allergen Reactions  . Penicillins Itching    No family history on file.  Prior to Admission medications   Medication Sig Start Date End Date Taking? Authorizing Provider  aspirin EC 81 MG tablet Take 81 mg by mouth daily.   Yes  Historical Provider, MD  Atorvastatin Calcium (LIPITOR PO) Take by mouth.   Yes Historical Provider, MD  insulin aspart (NOVOLOG) 100 UNIT/ML injection Inject 4 Units into the skin 3 (three) times daily with meals.   Yes Historical Provider, MD  insulin glargine (LANTUS) 100 UNIT/ML injection Inject 5 Units into the skin at bedtime.   Yes Historical Provider, MD  SitaGLIPtin-MetFORMIN HCl (JANUMET PO) Take by mouth.   Yes Historical Provider, MD    Physical Exam: There were no vitals filed for this visit.  General: Alert, Awake and Oriented to Time, Place and Person. Appear in mild distress Eyes: PERRL ENT: Oral Mucosa clear dry. Neck: No JVD Cardiovascular: S1 and S2 Present, aortic systolic Murmur, Peripheral Pulses Present Respiratory: Bilateral Air entry equal and Decreased, Clear to Auscultation,  No Crackles, no wheezes Abdomen: Bowel Sound Present, Soft and Non tender Skin: No Rash Extremities: Bilateral Pedal edema patient and family mentions is chronic, no calf tenderness Neurologic: Grossly Unremarkable.  Labs on Admission:  CBC: No leukocytosis, normocytic anemia  CMP BMET    Component Value Date/Time   NA 143 02/26/2013 0235   K 3.4* 02/26/2013 0235   CL 106 02/26/2013 0235   CO2 29 02/26/2013 0235   GLUCOSE 82 02/26/2013 0235   BUN 21 02/26/2013 0235   CREATININE 0.50 02/26/2013 0235   CALCIUM 8.8 02/26/2013 0235   GFRNONAA >90 02/26/2013 0235   GFRAA >90 02/26/2013 0235   lipase 66 Troponin negative Normal UA Normal chest x-ray  Assessment/Plan Principal Problem:   DKA (diabetic ketoacidoses) Active Problems:  Hypertension   Dementia   1. DKA (diabetic ketoacidoses) The patient is presenting with complaints of generalized weakness with elevated anion gap, metabolic acidosis with pH of 7.2, mild worsening of her renal function. She appears to have a DKA. She was started on insulin drip and the other facility and was transferred here. At the time  of arrival her blood glucose level was 170. A BMP here is pending. At present we will continue her insulin drip with IV fluids. We will reevaluate the hydration in morning since she has bilateral pedal edema but at present appears dehydrated with acute worsening of her renal function due to polyuria.  2. Hypertension At present holding her antihypertensive medications  3. Dementia Stable at present May be Indocin continue her diabetic care at home since she has been in the hospital twice with DKA due to inability to remember taking her medications  DVT Prophylaxis: subcutaneous Heparin Nutrition: N.p.o. at present  Code Status: Full  Family Communication: Family was present at bedside, opportunity was given to ask question and all questions were answered satisfactorily at the time of interview. Disposition: Admitted to inpatient in step-down unit.  Author: Lynden Oxford, MD [facility] Pager: [phone] 02/26/2013, 12:47 AM    If 7PM-7AM, please contact night-coverage [email or web address] Password [email or web address]

## 2013-02-26 NOTE — Progress Notes (Signed)
Patient seen and examined. Admitted after midnight secondary to DKA. Patient reports having trouble remembering checking her blood sugar and taking her medications. Patient initially seen at [facility] and transferred to [facility] due to no stepdown beds. She received IV insulin and lantus while in ED awaiting for transfer. Once at [facility] patient blood work demonstrated normal anion gap, no electrolytes abnormalities and normal bicarb levels; patient subsequently with CBG's X 4 at target and transitioned of IV insulin. On my examination she was hypoglycemic.  Plan: -transfer out of step down -continue SSI -advance diet to modified carb -will required close follow and family assistance for better medication control -will check A1C  Anastasiya Gowin [phone]

## 2013-02-27 DIAGNOSIS — E876 Hypokalemia: Secondary | ICD-10-CM | POA: Diagnosis not present

## 2013-02-27 DIAGNOSIS — E1110 Type 2 diabetes mellitus with ketoacidosis without coma: Secondary | ICD-10-CM | POA: Diagnosis not present

## 2013-02-27 DIAGNOSIS — I10 Essential (primary) hypertension: Secondary | ICD-10-CM | POA: Diagnosis not present

## 2013-02-27 DIAGNOSIS — F0390 Unspecified dementia without behavioral disturbance: Secondary | ICD-10-CM | POA: Diagnosis not present

## 2013-02-27 LAB — GLUCOSE, CAPILLARY: Glucose-Capillary: 120 mg/dL — ABNORMAL HIGH (ref 70–99)

## 2013-02-27 LAB — BASIC METABOLIC PANEL
CO2: 32 mEq/L (ref 19–32)
Calcium: 8.2 mg/dL — ABNORMAL LOW (ref 8.4–10.5)
Chloride: 103 mEq/L (ref 96–112)
GFR calc Af Amer: >90 mL/min (ref >90)
Glucose, Bld: 134 mg/dL — ABNORMAL HIGH (ref 70–99)
Potassium: 3.3 mEq/L — ABNORMAL LOW (ref 3.5–5.1)
Sodium: 138 mEq/L (ref 135–145)

## 2013-02-27 LAB — HEMOGLOBIN A1C: Hgb A1c MFr Bld: 14 % — ABNORMAL HIGH (ref <5.7)

## 2013-02-27 MED ORDER — INSULIN ASPART 100 UNIT/ML ~~LOC~~ SOLN: 7.0000 [IU] | Freq: Three times a day (TID) | SUBCUTANEOUS | Status: DC

## 2013-02-27 MED ORDER — INSULIN GLARGINE 100 UNIT/ML ~~LOC~~ SOLN
10.0000 [IU] | Freq: Every day | SUBCUTANEOUS | Status: DC
  Filled 2013-02-27: qty 0.1

## 2013-02-27 MED ORDER — POTASSIUM CHLORIDE CRYS ER 20 MEQ PO TBCR
40.0000 meq | EXTENDED_RELEASE_TABLET | Freq: Once | ORAL | Status: AC
  Administered 2013-02-27: 40 meq via ORAL

## 2013-02-27 MED ORDER — INSULIN GLARGINE 100 UNIT/ML ~~LOC~~ SOLN: 10.0000 [IU] | Freq: Every day | SUBCUTANEOUS | Status: DC

## 2013-02-27 MED ORDER — GLUCERNA SHAKE PO LIQD: 237.0000 mL | Freq: Two times a day (BID) | ORAL | Status: DC

## 2013-02-27 MED ORDER — INSULIN ASPART 100 UNIT/ML ~~LOC~~ SOLN
0.0000 [IU] | Freq: Three times a day (TID) | SUBCUTANEOUS | Status: DC
  Administered 2013-02-27: 7 [IU] via SUBCUTANEOUS

## 2013-02-27 MED ORDER — INSULIN ASPART 100 UNIT/ML ~~LOC~~ SOLN: 0.0000 [IU] | Freq: Every day | SUBCUTANEOUS | Status: DC

## 2013-02-27 MED ORDER — INSULIN ASPART 100 UNIT/ML ~~LOC~~ SOLN
3.0000 [IU] | Freq: Three times a day (TID) | SUBCUTANEOUS | Status: DC
  Administered 2013-02-27 (×2): 3 [IU] via SUBCUTANEOUS

## 2013-02-27 MED ORDER — INSULIN ASPART 100 UNIT/ML ~~LOC~~ SOLN: 0.0000 [IU] | Freq: Three times a day (TID) | SUBCUTANEOUS | Status: DC

## 2013-02-27 NOTE — Progress Notes (Signed)
Pt provided with dc instructions and education. Pt verbalized understanding. Pt daughter at bedside and aknowledge all med changes. Pt waiting to see case manager for Home health nurse set up. IV removed with tip intact. Heart monitor cleaned and returned to front. Levonne Spiller, RN

## 2013-02-27 NOTE — Discharge Summary (Signed)
Physician Discharge Summary  [name] ZOX:[id number] DOB: 1940-07-04 DOA: 02/25/2013  PCP: Nichole Rainwater, MD  Admit date: 02/25/2013 Discharge date: 02/27/2013  Time spent: >30 minutes  Recommendations for Outpatient Follow-up:  1. BMET to follow electrolytes and renal function 2. Close follow up to CBG's and adjust hypoglycemic regimen as needed  3. Reassess dementia status and start medications to slow progression if appropriate   Discharge Diagnoses:  Principal Problem:   DKA (diabetic ketoacidoses) Active Problems:   Hypertension   Dementia Nausea/vomiting Hypokalemia   Discharge Condition: stable and improved. Discharge home with [facility] for medication compliance and assistance.  Diet recommendation: low carbohydrates diet  Filed Weights   02/25/13 2352  Weight: 43.4 kg (95 lb 10.9 oz)    History of present illness:  72 y.o. female with Past medical history of diabetes mellitus, hypertension, dementia, heart murmur.  The patient is coming from home transfer from [facility].  The patient presented to [facility] with the complaint of generalized weakness. She mentions that this has been ongoing since last one week. There is also poor appetite and increased urination. She mentions she has been checking on and off her blood sugars and they have been running in 500s since last 5 days. As per the daughter the patient does not remember taking her insulin and does not remember checking her blood glucose 4 times a day.  The patient denies any complaint of fever, chills, headache, cough, chest pain, palpitation, shortness of breath, orthopnea, PND, nausea, vomiting, abdominal pain, diarrhea, constipation, active bleeding, burning urination, dizziness, pedal edema, focal neurological deficit.   Hospital Course:  1-DKA: secondary to medication non-compliance. -corrected with IV insulin. -at discharge CBG's in the 120-220 range. -electrolytes WNL.  2-uncontrolled diabetes type  2: A1C 14 -discharged on lantus 10 units at bedtime with 7 untis of novolog three times a day for meal coverage -patient will also continue metformin BID -HHRN for medication compliance and assistance has been arranged -patient will follow with PCP in 1 week for further adjustments on her hypoglycemic regimen  3-HLD: continue statins  4-dementia: to be assess by PCP and determine if patient will benefit of medications to slow down progression.  5-hypokalemia: repleted.  6-N/V: due to DKA. Resolved once hyperglycemia controlled. -at discharge no Nausea or Vomiting  Procedures:  See below for x-ray reports  Consultations:  None   Discharge Exam: Filed Vitals:   02/27/13 0547  BP: 145/91  Pulse: 69  Temp:   Resp: 15    General: no fever, no CP, no SOB Cardiovascular: S1 and S2, no rubs, positive SEM Respiratory: CTA bilaterally Abdomen: soft, NT, ND, positive BS  Discharge Instructions  Discharge Orders   Future Orders Complete By Expires   Discharge instructions  As directed    Comments:     Take medications as prescribed Follow a low carbohydrates diet Arrange follow up with PCP in 1 week Please create and bring with you a CBG log for further adjustment to hypoglycemic regimen       Medication List         aspirin EC 81 MG tablet  Take 81 mg by mouth daily.     atorvastatin 80 MG tablet  Commonly known as:  LIPITOR  Take 80 mg by mouth daily.     feeding supplement (GLUCERNA SHAKE) Liqd  Take 237 mLs by mouth 2 (two) times daily between meals.     insulin aspart 100 UNIT/ML injection  Commonly known as:  novoLOG  Inject 7 Units into the skin 3 (three) times daily with meals.     insulin glargine 100 UNIT/ML injection  Commonly known as:  LANTUS  Inject 0.1 mLs (10 Units total) into the skin at bedtime.     metformin 1000 MG (OSM) 24 hr tablet  Commonly known as:  FORTAMET  Take 1,000 mg by mouth 2 (two) times daily with a meal.        Allergies  Allergen Reactions  . Penicillins Itching       Follow-up Information   Follow up with Nichole Rainwater, MD. Schedule an appointment as soon as possible for a visit in 1 week.   Specialty:  Family Medicine   Contact information:   [address] [place] [address] [phone]       The results of significant diagnostics from this hospitalization (including imaging, microbiology, ancillary and laboratory) are listed below for reference.    Significant Diagnostic Studies: No results found.  Microbiology: Recent Results (from the past 240 hour(s))  MRSA PCR SCREENING     Status: None   Collection Time    02/26/13  6:02 AM      Result Value Range Status   MRSA by PCR NEGATIVE  NEGATIVE Final   Comment:            The GeneXpert MRSA Assay (FDA     approved for NASAL specimens     only), is one component of a     comprehensive MRSA colonization     surveillance program. It is not     intended to diagnose MRSA     infection nor to guide or     monitor treatment for     MRSA infections.     Labs: Basic Metabolic Panel:  Recent Labs Lab 02/26/13 0045 02/26/13 0235 02/26/13 0605 02/26/13 0740 02/27/13 0525  NA 143 143 143 143 138  K 3.3* 3.4* 3.6 3.7 3.3*  CL 104 106 106 106 103  CO2 31 29 28 27  32  GLUCOSE 123* 82 73 46* 134*  BUN 23 21 20 20 14   CREATININE 0.59 0.50 0.53 0.52 0.43*  CALCIUM 9.1 8.8 8.9 9.0 8.2*   Liver Function Tests:  Recent Labs Lab 02/26/13 0605  AST 17  ALT 19  ALKPHOS 45  BILITOT 0.6  PROT 5.7*  ALBUMIN 2.9*   CBC:  Recent Labs Lab 02/26/13 0605  WBC 5.9  NEUTROABS 3.5  HGB 12.5  HCT 36.6  MCV 89.5  PLT 173   CBG:  Recent Labs Lab 02/26/13 1130 02/26/13 1708 02/26/13 2111 02/27/13 0738 02/27/13 1136  GLUCAP 333* 313* 230* 120* 303*    Signed:  Broden Stokes  [facility] 02/27/2013, 12:45 PM

## 2013-02-27 NOTE — Progress Notes (Signed)
Inpatient Diabetes Program Recommendations  AACE/ADA: New Consensus Statement on Inpatient Glycemic Control (2013)  Target Ranges:  Prepandial:   less than 140 mg/dL      Peak postprandial:   less than 180 mg/dL (1-2 hours)      Critically ill patients:  140 - 180 mg/dL     Results for [name], [name] (MRN [id number]) as of 02/27/2013 12:59  Ref. Range 02/27/2013 07:38 02/27/2013 11:36  Glucose-Capillary Latest Range: 70-99 mg/dL 409 (H) 811 (H)    Results for Nichole, Stokes (MRN [id number]) as of 02/27/2013 12:59  Ref. Range 02/26/2013 10:50  Hemoglobin A1C Latest Range: <5.7 % 14.0 (H)    **Patient admitted with DKA.  A1c shows extremely poor control at home.  Patient admits to forgetting doses of her insulin at home.  **Spoke with patient and her daughter about patient's current A1c of 14.0% (02/26/13).  Explained what an A1c is and what it measures.  Reminded patient that her goal A1c is 7% or less per ADA standards to prevent both acute and long-term complications.  Patient told me, during our conversation, that she does forget her insulin at home.  Lives with her husband and 2 grandchildren.  Husband helps patient with her insulin sometimes.  Patient's daughter, who was in the room during our conversation, stated she would like for patient to have a HHRN visit with patient during the week for help with medications, etc.  **Patient's daughter stated she does not feel like her mom (patient) needs OP DM education at this point.  Feels HHRN is the better option.  **Spoke with Dr. Gwenlyn Perking this morning.  Dr. Gwenlyn Perking stated he plans to adjust patient's home insulins.  Spoke with care manager this morning.  Care manager to assist with [facility] referral.   Will follow. Ambrose Finland RN, MSN, CDE Diabetes Coordinator Inpatient Diabetes Program Team Pager: [phone] (8a-10p)

## 2013-02-27 NOTE — Care Management Note (Signed)
    Page 1 of 1   02/27/2013     1:52:16 PM   CARE MANAGEMENT NOTE 02/27/2013  Patient:  [name], [name]   Account Number:  [id number]  Date Initiated:  02/27/2013  Documentation initiated by:  GRAVES-BIGELOW,Valente Fosberg  Subjective/Objective Assessment:   Pt admitted for DKA. Pt is from home with husband.     Action/Plan:   CM spoke to pt and daughter and both are agreeable to [facility] services. Referral made with [facility] for RN services for medication management. SOC to begin within 24-48 hours post d/c.   Anticipated DC Date:  02/27/2013   Anticipated DC Plan:  HOME W HOME HEALTH SERVICES      DC Planning Services  CM consult      [facility] Choice  HOME HEALTH   Choice offered to / List presented to:  C-4 Adult Children        HH arranged  HH-1 RN  HH-10 DISEASE MANAGEMENT      HH agency  [facility]   Status of service:  Completed, signed off Medicare Important Message given?   (If response is "NO", the following Medicare IM given date fields will be blank) Date Medicare IM given:   Date Additional Medicare IM given:    Discharge Disposition:  HOME W HOME HEALTH SERVICES  Per UR Regulation:  Reviewed for med. necessity/level of care/duration of stay  If discussed at Long Length of Stay Meetings, dates discussed:    Comments:

## 2013-03-05 DIAGNOSIS — I10 Essential (primary) hypertension: Secondary | ICD-10-CM | POA: Diagnosis not present

## 2013-03-05 DIAGNOSIS — E119 Type 2 diabetes mellitus without complications: Secondary | ICD-10-CM | POA: Diagnosis not present

## 2013-03-05 DIAGNOSIS — F0390 Unspecified dementia without behavioral disturbance: Secondary | ICD-10-CM | POA: Diagnosis not present

## 2013-03-05 DIAGNOSIS — Z794 Long term (current) use of insulin: Secondary | ICD-10-CM | POA: Diagnosis not present

## 2013-03-07 DIAGNOSIS — E1065 Type 1 diabetes mellitus with hyperglycemia: Secondary | ICD-10-CM | POA: Diagnosis not present

## 2013-03-10 DIAGNOSIS — Z794 Long term (current) use of insulin: Secondary | ICD-10-CM | POA: Diagnosis not present

## 2013-03-10 DIAGNOSIS — E119 Type 2 diabetes mellitus without complications: Secondary | ICD-10-CM | POA: Diagnosis not present

## 2013-03-10 DIAGNOSIS — I10 Essential (primary) hypertension: Secondary | ICD-10-CM | POA: Diagnosis not present

## 2013-03-10 DIAGNOSIS — F0390 Unspecified dementia without behavioral disturbance: Secondary | ICD-10-CM | POA: Diagnosis not present

## 2013-03-13 DIAGNOSIS — Z794 Long term (current) use of insulin: Secondary | ICD-10-CM | POA: Diagnosis not present

## 2013-03-13 DIAGNOSIS — E119 Type 2 diabetes mellitus without complications: Secondary | ICD-10-CM | POA: Diagnosis not present

## 2013-03-13 DIAGNOSIS — F0390 Unspecified dementia without behavioral disturbance: Secondary | ICD-10-CM | POA: Diagnosis not present

## 2013-03-13 DIAGNOSIS — I10 Essential (primary) hypertension: Secondary | ICD-10-CM | POA: Diagnosis not present

## 2013-03-15 DIAGNOSIS — Z794 Long term (current) use of insulin: Secondary | ICD-10-CM | POA: Diagnosis not present

## 2013-03-15 DIAGNOSIS — I10 Essential (primary) hypertension: Secondary | ICD-10-CM | POA: Diagnosis not present

## 2013-03-15 DIAGNOSIS — E119 Type 2 diabetes mellitus without complications: Secondary | ICD-10-CM | POA: Diagnosis not present

## 2013-03-15 DIAGNOSIS — F0390 Unspecified dementia without behavioral disturbance: Secondary | ICD-10-CM | POA: Diagnosis not present

## 2013-03-17 DIAGNOSIS — Z794 Long term (current) use of insulin: Secondary | ICD-10-CM | POA: Diagnosis not present

## 2013-03-17 DIAGNOSIS — I10 Essential (primary) hypertension: Secondary | ICD-10-CM | POA: Diagnosis not present

## 2013-03-17 DIAGNOSIS — F0390 Unspecified dementia without behavioral disturbance: Secondary | ICD-10-CM | POA: Diagnosis not present

## 2013-03-17 DIAGNOSIS — E119 Type 2 diabetes mellitus without complications: Secondary | ICD-10-CM | POA: Diagnosis not present

## 2013-03-18 DIAGNOSIS — R300 Dysuria: Secondary | ICD-10-CM | POA: Diagnosis not present

## 2013-03-18 DIAGNOSIS — Z794 Long term (current) use of insulin: Secondary | ICD-10-CM | POA: Diagnosis not present

## 2013-03-18 DIAGNOSIS — I10 Essential (primary) hypertension: Secondary | ICD-10-CM | POA: Diagnosis not present

## 2013-03-18 DIAGNOSIS — F0390 Unspecified dementia without behavioral disturbance: Secondary | ICD-10-CM | POA: Diagnosis not present

## 2013-03-18 DIAGNOSIS — E119 Type 2 diabetes mellitus without complications: Secondary | ICD-10-CM | POA: Diagnosis not present

## 2013-03-20 DIAGNOSIS — I10 Essential (primary) hypertension: Secondary | ICD-10-CM | POA: Diagnosis not present

## 2013-03-20 DIAGNOSIS — Z794 Long term (current) use of insulin: Secondary | ICD-10-CM | POA: Diagnosis not present

## 2013-03-20 DIAGNOSIS — F0390 Unspecified dementia without behavioral disturbance: Secondary | ICD-10-CM | POA: Diagnosis not present

## 2013-03-20 DIAGNOSIS — E119 Type 2 diabetes mellitus without complications: Secondary | ICD-10-CM | POA: Diagnosis not present

## 2013-03-21 DIAGNOSIS — I359 Nonrheumatic aortic valve disorder, unspecified: Secondary | ICD-10-CM | POA: Diagnosis not present

## 2013-03-21 DIAGNOSIS — I10 Essential (primary) hypertension: Secondary | ICD-10-CM | POA: Diagnosis not present

## 2013-03-21 DIAGNOSIS — IMO0001 Reserved for inherently not codable concepts without codable children: Secondary | ICD-10-CM | POA: Diagnosis not present

## 2013-03-21 DIAGNOSIS — E785 Hyperlipidemia, unspecified: Secondary | ICD-10-CM | POA: Diagnosis not present

## 2013-03-22 DIAGNOSIS — E119 Type 2 diabetes mellitus without complications: Secondary | ICD-10-CM | POA: Diagnosis not present

## 2013-03-22 DIAGNOSIS — F0390 Unspecified dementia without behavioral disturbance: Secondary | ICD-10-CM | POA: Diagnosis not present

## 2013-03-22 DIAGNOSIS — Z794 Long term (current) use of insulin: Secondary | ICD-10-CM | POA: Diagnosis not present

## 2013-03-22 DIAGNOSIS — I10 Essential (primary) hypertension: Secondary | ICD-10-CM | POA: Diagnosis not present

## 2013-03-23 DIAGNOSIS — I10 Essential (primary) hypertension: Secondary | ICD-10-CM | POA: Diagnosis not present

## 2013-03-23 DIAGNOSIS — Z794 Long term (current) use of insulin: Secondary | ICD-10-CM | POA: Diagnosis not present

## 2013-03-23 DIAGNOSIS — E119 Type 2 diabetes mellitus without complications: Secondary | ICD-10-CM | POA: Diagnosis not present

## 2013-03-23 DIAGNOSIS — F0390 Unspecified dementia without behavioral disturbance: Secondary | ICD-10-CM | POA: Diagnosis not present

## 2013-03-28 DIAGNOSIS — Z794 Long term (current) use of insulin: Secondary | ICD-10-CM | POA: Diagnosis not present

## 2013-03-28 DIAGNOSIS — I10 Essential (primary) hypertension: Secondary | ICD-10-CM | POA: Diagnosis not present

## 2013-03-28 DIAGNOSIS — E119 Type 2 diabetes mellitus without complications: Secondary | ICD-10-CM | POA: Diagnosis not present

## 2013-03-28 DIAGNOSIS — F0390 Unspecified dementia without behavioral disturbance: Secondary | ICD-10-CM | POA: Diagnosis not present

## 2013-03-29 DIAGNOSIS — F0390 Unspecified dementia without behavioral disturbance: Secondary | ICD-10-CM | POA: Diagnosis not present

## 2013-03-29 DIAGNOSIS — I10 Essential (primary) hypertension: Secondary | ICD-10-CM | POA: Diagnosis not present

## 2013-03-29 DIAGNOSIS — E119 Type 2 diabetes mellitus without complications: Secondary | ICD-10-CM | POA: Diagnosis not present

## 2013-03-29 DIAGNOSIS — Z794 Long term (current) use of insulin: Secondary | ICD-10-CM | POA: Diagnosis not present

## 2013-04-03 DIAGNOSIS — I10 Essential (primary) hypertension: Secondary | ICD-10-CM | POA: Diagnosis not present

## 2013-04-03 DIAGNOSIS — F0390 Unspecified dementia without behavioral disturbance: Secondary | ICD-10-CM | POA: Diagnosis not present

## 2013-04-03 DIAGNOSIS — Z794 Long term (current) use of insulin: Secondary | ICD-10-CM | POA: Diagnosis not present

## 2013-04-03 DIAGNOSIS — E119 Type 2 diabetes mellitus without complications: Secondary | ICD-10-CM | POA: Diagnosis not present

## 2013-04-05 DIAGNOSIS — I10 Essential (primary) hypertension: Secondary | ICD-10-CM | POA: Diagnosis not present

## 2013-04-05 DIAGNOSIS — F0390 Unspecified dementia without behavioral disturbance: Secondary | ICD-10-CM | POA: Diagnosis not present

## 2013-04-05 DIAGNOSIS — E119 Type 2 diabetes mellitus without complications: Secondary | ICD-10-CM | POA: Diagnosis not present

## 2013-04-05 DIAGNOSIS — Z794 Long term (current) use of insulin: Secondary | ICD-10-CM | POA: Diagnosis not present

## 2013-04-06 DIAGNOSIS — E119 Type 2 diabetes mellitus without complications: Secondary | ICD-10-CM | POA: Diagnosis not present

## 2013-04-06 DIAGNOSIS — Z794 Long term (current) use of insulin: Secondary | ICD-10-CM | POA: Diagnosis not present

## 2013-04-06 DIAGNOSIS — F0390 Unspecified dementia without behavioral disturbance: Secondary | ICD-10-CM | POA: Diagnosis not present

## 2013-04-06 DIAGNOSIS — I10 Essential (primary) hypertension: Secondary | ICD-10-CM | POA: Diagnosis not present

## 2013-04-10 DIAGNOSIS — F0390 Unspecified dementia without behavioral disturbance: Secondary | ICD-10-CM | POA: Diagnosis not present

## 2013-04-10 DIAGNOSIS — E119 Type 2 diabetes mellitus without complications: Secondary | ICD-10-CM | POA: Diagnosis not present

## 2013-04-10 DIAGNOSIS — Z794 Long term (current) use of insulin: Secondary | ICD-10-CM | POA: Diagnosis not present

## 2013-04-10 DIAGNOSIS — I10 Essential (primary) hypertension: Secondary | ICD-10-CM | POA: Diagnosis not present

## 2013-04-11 DIAGNOSIS — Z794 Long term (current) use of insulin: Secondary | ICD-10-CM | POA: Diagnosis not present

## 2013-04-11 DIAGNOSIS — E119 Type 2 diabetes mellitus without complications: Secondary | ICD-10-CM | POA: Diagnosis not present

## 2013-04-11 DIAGNOSIS — F0390 Unspecified dementia without behavioral disturbance: Secondary | ICD-10-CM | POA: Diagnosis not present

## 2013-04-11 DIAGNOSIS — I10 Essential (primary) hypertension: Secondary | ICD-10-CM | POA: Diagnosis not present

## 2013-04-12 DIAGNOSIS — E119 Type 2 diabetes mellitus without complications: Secondary | ICD-10-CM | POA: Diagnosis not present

## 2013-04-12 DIAGNOSIS — Z794 Long term (current) use of insulin: Secondary | ICD-10-CM | POA: Diagnosis not present

## 2013-04-12 DIAGNOSIS — F0390 Unspecified dementia without behavioral disturbance: Secondary | ICD-10-CM | POA: Diagnosis not present

## 2013-04-12 DIAGNOSIS — I10 Essential (primary) hypertension: Secondary | ICD-10-CM | POA: Diagnosis not present

## 2013-04-13 DIAGNOSIS — I10 Essential (primary) hypertension: Secondary | ICD-10-CM | POA: Diagnosis not present

## 2013-04-13 DIAGNOSIS — Z794 Long term (current) use of insulin: Secondary | ICD-10-CM | POA: Diagnosis not present

## 2013-04-13 DIAGNOSIS — E119 Type 2 diabetes mellitus without complications: Secondary | ICD-10-CM | POA: Diagnosis not present

## 2013-04-13 DIAGNOSIS — F0390 Unspecified dementia without behavioral disturbance: Secondary | ICD-10-CM | POA: Diagnosis not present

## 2013-04-17 DIAGNOSIS — F0390 Unspecified dementia without behavioral disturbance: Secondary | ICD-10-CM | POA: Diagnosis not present

## 2013-04-17 DIAGNOSIS — Z794 Long term (current) use of insulin: Secondary | ICD-10-CM | POA: Diagnosis not present

## 2013-04-17 DIAGNOSIS — E119 Type 2 diabetes mellitus without complications: Secondary | ICD-10-CM | POA: Diagnosis not present

## 2013-04-17 DIAGNOSIS — I10 Essential (primary) hypertension: Secondary | ICD-10-CM | POA: Diagnosis not present

## 2013-04-24 DIAGNOSIS — E119 Type 2 diabetes mellitus without complications: Secondary | ICD-10-CM | POA: Diagnosis not present

## 2013-04-24 DIAGNOSIS — F0390 Unspecified dementia without behavioral disturbance: Secondary | ICD-10-CM | POA: Diagnosis not present

## 2013-04-24 DIAGNOSIS — I10 Essential (primary) hypertension: Secondary | ICD-10-CM | POA: Diagnosis not present

## 2013-04-24 DIAGNOSIS — Z794 Long term (current) use of insulin: Secondary | ICD-10-CM | POA: Diagnosis not present

## 2013-04-28 DIAGNOSIS — R634 Abnormal weight loss: Secondary | ICD-10-CM | POA: Diagnosis not present

## 2013-04-28 DIAGNOSIS — IMO0001 Reserved for inherently not codable concepts without codable children: Secondary | ICD-10-CM | POA: Diagnosis not present

## 2013-04-28 DIAGNOSIS — E162 Hypoglycemia, unspecified: Secondary | ICD-10-CM | POA: Diagnosis not present

## 2013-05-01 DIAGNOSIS — E119 Type 2 diabetes mellitus without complications: Secondary | ICD-10-CM | POA: Diagnosis not present

## 2013-05-01 DIAGNOSIS — F0390 Unspecified dementia without behavioral disturbance: Secondary | ICD-10-CM | POA: Diagnosis not present

## 2013-05-01 DIAGNOSIS — Z794 Long term (current) use of insulin: Secondary | ICD-10-CM | POA: Diagnosis not present

## 2013-05-01 DIAGNOSIS — I10 Essential (primary) hypertension: Secondary | ICD-10-CM | POA: Diagnosis not present

## 2013-05-02 DIAGNOSIS — E119 Type 2 diabetes mellitus without complications: Secondary | ICD-10-CM | POA: Diagnosis not present

## 2013-05-02 DIAGNOSIS — I10 Essential (primary) hypertension: Secondary | ICD-10-CM | POA: Diagnosis not present

## 2013-05-02 DIAGNOSIS — F0390 Unspecified dementia without behavioral disturbance: Secondary | ICD-10-CM | POA: Diagnosis not present

## 2013-05-02 DIAGNOSIS — Z794 Long term (current) use of insulin: Secondary | ICD-10-CM | POA: Diagnosis not present

## 2013-05-02 DIAGNOSIS — N390 Urinary tract infection, site not specified: Secondary | ICD-10-CM | POA: Diagnosis not present

## 2013-05-04 DIAGNOSIS — E119 Type 2 diabetes mellitus without complications: Secondary | ICD-10-CM | POA: Diagnosis not present

## 2013-05-04 DIAGNOSIS — F0390 Unspecified dementia without behavioral disturbance: Secondary | ICD-10-CM | POA: Diagnosis not present

## 2013-05-04 DIAGNOSIS — N390 Urinary tract infection, site not specified: Secondary | ICD-10-CM | POA: Diagnosis not present

## 2013-05-04 DIAGNOSIS — Z794 Long term (current) use of insulin: Secondary | ICD-10-CM | POA: Diagnosis not present

## 2013-05-04 DIAGNOSIS — I10 Essential (primary) hypertension: Secondary | ICD-10-CM | POA: Diagnosis not present

## 2013-05-08 DIAGNOSIS — I10 Essential (primary) hypertension: Secondary | ICD-10-CM | POA: Diagnosis not present

## 2013-05-08 DIAGNOSIS — Z794 Long term (current) use of insulin: Secondary | ICD-10-CM | POA: Diagnosis not present

## 2013-05-08 DIAGNOSIS — N390 Urinary tract infection, site not specified: Secondary | ICD-10-CM | POA: Diagnosis not present

## 2013-05-08 DIAGNOSIS — F0390 Unspecified dementia without behavioral disturbance: Secondary | ICD-10-CM | POA: Diagnosis not present

## 2013-05-08 DIAGNOSIS — E119 Type 2 diabetes mellitus without complications: Secondary | ICD-10-CM | POA: Diagnosis not present

## 2013-05-15 DIAGNOSIS — E119 Type 2 diabetes mellitus without complications: Secondary | ICD-10-CM | POA: Diagnosis not present

## 2013-05-15 DIAGNOSIS — N390 Urinary tract infection, site not specified: Secondary | ICD-10-CM | POA: Diagnosis not present

## 2013-05-15 DIAGNOSIS — Z794 Long term (current) use of insulin: Secondary | ICD-10-CM | POA: Diagnosis not present

## 2013-05-15 DIAGNOSIS — I10 Essential (primary) hypertension: Secondary | ICD-10-CM | POA: Diagnosis not present

## 2013-05-15 DIAGNOSIS — F0390 Unspecified dementia without behavioral disturbance: Secondary | ICD-10-CM | POA: Diagnosis not present

## 2013-05-22 DIAGNOSIS — E119 Type 2 diabetes mellitus without complications: Secondary | ICD-10-CM | POA: Diagnosis not present

## 2013-05-22 DIAGNOSIS — I10 Essential (primary) hypertension: Secondary | ICD-10-CM | POA: Diagnosis not present

## 2013-05-22 DIAGNOSIS — F0390 Unspecified dementia without behavioral disturbance: Secondary | ICD-10-CM | POA: Diagnosis not present

## 2013-05-22 DIAGNOSIS — N390 Urinary tract infection, site not specified: Secondary | ICD-10-CM | POA: Diagnosis not present

## 2013-05-22 DIAGNOSIS — Z794 Long term (current) use of insulin: Secondary | ICD-10-CM | POA: Diagnosis not present

## 2013-05-29 DIAGNOSIS — Z794 Long term (current) use of insulin: Secondary | ICD-10-CM | POA: Diagnosis not present

## 2013-05-29 DIAGNOSIS — I10 Essential (primary) hypertension: Secondary | ICD-10-CM | POA: Diagnosis not present

## 2013-05-29 DIAGNOSIS — E119 Type 2 diabetes mellitus without complications: Secondary | ICD-10-CM | POA: Diagnosis not present

## 2013-05-29 DIAGNOSIS — F0390 Unspecified dementia without behavioral disturbance: Secondary | ICD-10-CM | POA: Diagnosis not present

## 2013-05-29 DIAGNOSIS — IMO0001 Reserved for inherently not codable concepts without codable children: Secondary | ICD-10-CM | POA: Diagnosis not present

## 2013-05-29 DIAGNOSIS — N390 Urinary tract infection, site not specified: Secondary | ICD-10-CM | POA: Diagnosis not present

## 2013-06-05 DIAGNOSIS — Z794 Long term (current) use of insulin: Secondary | ICD-10-CM | POA: Diagnosis not present

## 2013-06-05 DIAGNOSIS — N390 Urinary tract infection, site not specified: Secondary | ICD-10-CM | POA: Diagnosis not present

## 2013-06-05 DIAGNOSIS — F0390 Unspecified dementia without behavioral disturbance: Secondary | ICD-10-CM | POA: Diagnosis not present

## 2013-06-05 DIAGNOSIS — I10 Essential (primary) hypertension: Secondary | ICD-10-CM | POA: Diagnosis not present

## 2013-06-05 DIAGNOSIS — E119 Type 2 diabetes mellitus without complications: Secondary | ICD-10-CM | POA: Diagnosis not present

## 2013-06-09 DIAGNOSIS — E119 Type 2 diabetes mellitus without complications: Secondary | ICD-10-CM | POA: Diagnosis not present

## 2013-06-09 DIAGNOSIS — I10 Essential (primary) hypertension: Secondary | ICD-10-CM | POA: Diagnosis not present

## 2013-06-09 DIAGNOSIS — Z794 Long term (current) use of insulin: Secondary | ICD-10-CM | POA: Diagnosis not present

## 2013-06-09 DIAGNOSIS — N390 Urinary tract infection, site not specified: Secondary | ICD-10-CM | POA: Diagnosis not present

## 2013-06-09 DIAGNOSIS — F0390 Unspecified dementia without behavioral disturbance: Secondary | ICD-10-CM | POA: Diagnosis not present

## 2013-06-12 DIAGNOSIS — Z794 Long term (current) use of insulin: Secondary | ICD-10-CM | POA: Diagnosis not present

## 2013-06-12 DIAGNOSIS — I10 Essential (primary) hypertension: Secondary | ICD-10-CM | POA: Diagnosis not present

## 2013-06-12 DIAGNOSIS — E119 Type 2 diabetes mellitus without complications: Secondary | ICD-10-CM | POA: Diagnosis not present

## 2013-06-12 DIAGNOSIS — N390 Urinary tract infection, site not specified: Secondary | ICD-10-CM | POA: Diagnosis not present

## 2013-06-12 DIAGNOSIS — F0390 Unspecified dementia without behavioral disturbance: Secondary | ICD-10-CM | POA: Diagnosis not present

## 2013-06-16 DIAGNOSIS — N390 Urinary tract infection, site not specified: Secondary | ICD-10-CM | POA: Diagnosis not present

## 2013-06-16 DIAGNOSIS — Z794 Long term (current) use of insulin: Secondary | ICD-10-CM | POA: Diagnosis not present

## 2013-06-16 DIAGNOSIS — I10 Essential (primary) hypertension: Secondary | ICD-10-CM | POA: Diagnosis not present

## 2013-06-16 DIAGNOSIS — E119 Type 2 diabetes mellitus without complications: Secondary | ICD-10-CM | POA: Diagnosis not present

## 2013-06-16 DIAGNOSIS — F0390 Unspecified dementia without behavioral disturbance: Secondary | ICD-10-CM | POA: Diagnosis not present

## 2013-06-19 DIAGNOSIS — Z794 Long term (current) use of insulin: Secondary | ICD-10-CM | POA: Diagnosis not present

## 2013-06-19 DIAGNOSIS — E119 Type 2 diabetes mellitus without complications: Secondary | ICD-10-CM | POA: Diagnosis not present

## 2013-06-19 DIAGNOSIS — N390 Urinary tract infection, site not specified: Secondary | ICD-10-CM | POA: Diagnosis not present

## 2013-06-19 DIAGNOSIS — I10 Essential (primary) hypertension: Secondary | ICD-10-CM | POA: Diagnosis not present

## 2013-06-19 DIAGNOSIS — F0390 Unspecified dementia without behavioral disturbance: Secondary | ICD-10-CM | POA: Diagnosis not present

## 2013-06-23 DIAGNOSIS — Z794 Long term (current) use of insulin: Secondary | ICD-10-CM | POA: Diagnosis not present

## 2013-06-23 DIAGNOSIS — N390 Urinary tract infection, site not specified: Secondary | ICD-10-CM | POA: Diagnosis not present

## 2013-06-23 DIAGNOSIS — I10 Essential (primary) hypertension: Secondary | ICD-10-CM | POA: Diagnosis not present

## 2013-06-23 DIAGNOSIS — F0390 Unspecified dementia without behavioral disturbance: Secondary | ICD-10-CM | POA: Diagnosis not present

## 2013-06-23 DIAGNOSIS — E119 Type 2 diabetes mellitus without complications: Secondary | ICD-10-CM | POA: Diagnosis not present

## 2013-06-26 DIAGNOSIS — Z794 Long term (current) use of insulin: Secondary | ICD-10-CM | POA: Diagnosis not present

## 2013-06-26 DIAGNOSIS — F0390 Unspecified dementia without behavioral disturbance: Secondary | ICD-10-CM | POA: Diagnosis not present

## 2013-06-26 DIAGNOSIS — N390 Urinary tract infection, site not specified: Secondary | ICD-10-CM | POA: Diagnosis not present

## 2013-06-26 DIAGNOSIS — I10 Essential (primary) hypertension: Secondary | ICD-10-CM | POA: Diagnosis not present

## 2013-06-26 DIAGNOSIS — E119 Type 2 diabetes mellitus without complications: Secondary | ICD-10-CM | POA: Diagnosis not present

## 2013-06-30 DIAGNOSIS — I10 Essential (primary) hypertension: Secondary | ICD-10-CM | POA: Diagnosis not present

## 2013-06-30 DIAGNOSIS — N390 Urinary tract infection, site not specified: Secondary | ICD-10-CM | POA: Diagnosis not present

## 2013-06-30 DIAGNOSIS — E119 Type 2 diabetes mellitus without complications: Secondary | ICD-10-CM | POA: Diagnosis not present

## 2013-06-30 DIAGNOSIS — Z794 Long term (current) use of insulin: Secondary | ICD-10-CM | POA: Diagnosis not present

## 2013-06-30 DIAGNOSIS — F0390 Unspecified dementia without behavioral disturbance: Secondary | ICD-10-CM | POA: Diagnosis not present

## 2013-07-03 DIAGNOSIS — F0390 Unspecified dementia without behavioral disturbance: Secondary | ICD-10-CM | POA: Diagnosis not present

## 2013-07-03 DIAGNOSIS — I10 Essential (primary) hypertension: Secondary | ICD-10-CM | POA: Diagnosis not present

## 2013-07-03 DIAGNOSIS — Z794 Long term (current) use of insulin: Secondary | ICD-10-CM | POA: Diagnosis not present

## 2013-07-03 DIAGNOSIS — E119 Type 2 diabetes mellitus without complications: Secondary | ICD-10-CM | POA: Diagnosis not present

## 2013-07-10 DIAGNOSIS — F0390 Unspecified dementia without behavioral disturbance: Secondary | ICD-10-CM | POA: Diagnosis not present

## 2013-07-10 DIAGNOSIS — I10 Essential (primary) hypertension: Secondary | ICD-10-CM | POA: Diagnosis not present

## 2013-07-10 DIAGNOSIS — Z794 Long term (current) use of insulin: Secondary | ICD-10-CM | POA: Diagnosis not present

## 2013-07-10 DIAGNOSIS — E119 Type 2 diabetes mellitus without complications: Secondary | ICD-10-CM | POA: Diagnosis not present

## 2013-07-18 DIAGNOSIS — I10 Essential (primary) hypertension: Secondary | ICD-10-CM | POA: Diagnosis not present

## 2013-07-18 DIAGNOSIS — Z794 Long term (current) use of insulin: Secondary | ICD-10-CM | POA: Diagnosis not present

## 2013-07-18 DIAGNOSIS — IMO0001 Reserved for inherently not codable concepts without codable children: Secondary | ICD-10-CM | POA: Diagnosis not present

## 2013-07-18 DIAGNOSIS — E119 Type 2 diabetes mellitus without complications: Secondary | ICD-10-CM | POA: Diagnosis not present

## 2013-07-18 DIAGNOSIS — F0390 Unspecified dementia without behavioral disturbance: Secondary | ICD-10-CM | POA: Diagnosis not present

## 2013-07-24 DIAGNOSIS — Z794 Long term (current) use of insulin: Secondary | ICD-10-CM | POA: Diagnosis not present

## 2013-07-24 DIAGNOSIS — I10 Essential (primary) hypertension: Secondary | ICD-10-CM | POA: Diagnosis not present

## 2013-07-24 DIAGNOSIS — F0390 Unspecified dementia without behavioral disturbance: Secondary | ICD-10-CM | POA: Diagnosis not present

## 2013-07-24 DIAGNOSIS — E119 Type 2 diabetes mellitus without complications: Secondary | ICD-10-CM | POA: Diagnosis not present

## 2013-07-24 DIAGNOSIS — H35379 Puckering of macula, unspecified eye: Secondary | ICD-10-CM | POA: Diagnosis not present

## 2013-07-25 DIAGNOSIS — E1129 Type 2 diabetes mellitus with other diabetic kidney complication: Secondary | ICD-10-CM | POA: Diagnosis not present

## 2013-07-25 DIAGNOSIS — E785 Hyperlipidemia, unspecified: Secondary | ICD-10-CM | POA: Diagnosis not present

## 2013-07-25 DIAGNOSIS — I10 Essential (primary) hypertension: Secondary | ICD-10-CM | POA: Diagnosis not present

## 2013-07-25 DIAGNOSIS — R413 Other amnesia: Secondary | ICD-10-CM | POA: Diagnosis not present

## 2013-07-31 DIAGNOSIS — Z794 Long term (current) use of insulin: Secondary | ICD-10-CM | POA: Diagnosis not present

## 2013-07-31 DIAGNOSIS — E119 Type 2 diabetes mellitus without complications: Secondary | ICD-10-CM | POA: Diagnosis not present

## 2013-07-31 DIAGNOSIS — I10 Essential (primary) hypertension: Secondary | ICD-10-CM | POA: Diagnosis not present

## 2013-07-31 DIAGNOSIS — F0390 Unspecified dementia without behavioral disturbance: Secondary | ICD-10-CM | POA: Diagnosis not present

## 2013-08-01 DIAGNOSIS — G8929 Other chronic pain: Secondary | ICD-10-CM | POA: Insufficient documentation

## 2013-08-01 DIAGNOSIS — E119 Type 2 diabetes mellitus without complications: Secondary | ICD-10-CM | POA: Diagnosis not present

## 2013-08-01 DIAGNOSIS — R634 Abnormal weight loss: Secondary | ICD-10-CM | POA: Diagnosis not present

## 2013-08-01 DIAGNOSIS — M545 Low back pain, unspecified: Secondary | ICD-10-CM | POA: Insufficient documentation

## 2013-08-01 DIAGNOSIS — N3941 Urge incontinence: Secondary | ICD-10-CM | POA: Insufficient documentation

## 2013-08-01 DIAGNOSIS — E1069 Type 1 diabetes mellitus with other specified complication: Secondary | ICD-10-CM | POA: Insufficient documentation

## 2013-08-01 DIAGNOSIS — E785 Hyperlipidemia, unspecified: Secondary | ICD-10-CM

## 2013-08-07 DIAGNOSIS — F0390 Unspecified dementia without behavioral disturbance: Secondary | ICD-10-CM | POA: Diagnosis not present

## 2013-08-07 DIAGNOSIS — E119 Type 2 diabetes mellitus without complications: Secondary | ICD-10-CM | POA: Diagnosis not present

## 2013-08-07 DIAGNOSIS — Z794 Long term (current) use of insulin: Secondary | ICD-10-CM | POA: Diagnosis not present

## 2013-08-07 DIAGNOSIS — I10 Essential (primary) hypertension: Secondary | ICD-10-CM | POA: Diagnosis not present

## 2013-08-14 DIAGNOSIS — Z794 Long term (current) use of insulin: Secondary | ICD-10-CM | POA: Diagnosis not present

## 2013-08-14 DIAGNOSIS — I10 Essential (primary) hypertension: Secondary | ICD-10-CM | POA: Diagnosis not present

## 2013-08-14 DIAGNOSIS — F0390 Unspecified dementia without behavioral disturbance: Secondary | ICD-10-CM | POA: Diagnosis not present

## 2013-08-14 DIAGNOSIS — E119 Type 2 diabetes mellitus without complications: Secondary | ICD-10-CM | POA: Diagnosis not present

## 2013-08-21 DIAGNOSIS — I10 Essential (primary) hypertension: Secondary | ICD-10-CM | POA: Diagnosis not present

## 2013-08-21 DIAGNOSIS — Z794 Long term (current) use of insulin: Secondary | ICD-10-CM | POA: Diagnosis not present

## 2013-08-21 DIAGNOSIS — E119 Type 2 diabetes mellitus without complications: Secondary | ICD-10-CM | POA: Diagnosis not present

## 2013-08-21 DIAGNOSIS — F0390 Unspecified dementia without behavioral disturbance: Secondary | ICD-10-CM | POA: Diagnosis not present

## 2013-08-30 DIAGNOSIS — F0390 Unspecified dementia without behavioral disturbance: Secondary | ICD-10-CM | POA: Diagnosis not present

## 2013-08-30 DIAGNOSIS — E119 Type 2 diabetes mellitus without complications: Secondary | ICD-10-CM | POA: Diagnosis not present

## 2013-08-30 DIAGNOSIS — I10 Essential (primary) hypertension: Secondary | ICD-10-CM | POA: Diagnosis not present

## 2013-08-30 DIAGNOSIS — Z794 Long term (current) use of insulin: Secondary | ICD-10-CM | POA: Diagnosis not present

## 2013-09-01 DIAGNOSIS — F0390 Unspecified dementia without behavioral disturbance: Secondary | ICD-10-CM | POA: Diagnosis not present

## 2013-09-01 DIAGNOSIS — I10 Essential (primary) hypertension: Secondary | ICD-10-CM | POA: Diagnosis not present

## 2013-09-01 DIAGNOSIS — Z794 Long term (current) use of insulin: Secondary | ICD-10-CM | POA: Diagnosis not present

## 2013-09-01 DIAGNOSIS — E119 Type 2 diabetes mellitus without complications: Secondary | ICD-10-CM | POA: Diagnosis not present

## 2013-09-06 DIAGNOSIS — Z794 Long term (current) use of insulin: Secondary | ICD-10-CM | POA: Diagnosis not present

## 2013-09-06 DIAGNOSIS — I10 Essential (primary) hypertension: Secondary | ICD-10-CM | POA: Diagnosis not present

## 2013-09-06 DIAGNOSIS — F0390 Unspecified dementia without behavioral disturbance: Secondary | ICD-10-CM | POA: Diagnosis not present

## 2013-09-06 DIAGNOSIS — E119 Type 2 diabetes mellitus without complications: Secondary | ICD-10-CM | POA: Diagnosis not present

## 2013-09-13 DIAGNOSIS — E119 Type 2 diabetes mellitus without complications: Secondary | ICD-10-CM | POA: Diagnosis not present

## 2013-09-13 DIAGNOSIS — Z794 Long term (current) use of insulin: Secondary | ICD-10-CM | POA: Diagnosis not present

## 2013-09-13 DIAGNOSIS — F0390 Unspecified dementia without behavioral disturbance: Secondary | ICD-10-CM | POA: Diagnosis not present

## 2013-09-13 DIAGNOSIS — I10 Essential (primary) hypertension: Secondary | ICD-10-CM | POA: Diagnosis not present

## 2013-09-18 DIAGNOSIS — I10 Essential (primary) hypertension: Secondary | ICD-10-CM | POA: Diagnosis not present

## 2013-09-18 DIAGNOSIS — Z794 Long term (current) use of insulin: Secondary | ICD-10-CM | POA: Diagnosis not present

## 2013-09-18 DIAGNOSIS — E119 Type 2 diabetes mellitus without complications: Secondary | ICD-10-CM | POA: Diagnosis not present

## 2013-09-18 DIAGNOSIS — F0390 Unspecified dementia without behavioral disturbance: Secondary | ICD-10-CM | POA: Diagnosis not present

## 2013-09-25 DIAGNOSIS — E119 Type 2 diabetes mellitus without complications: Secondary | ICD-10-CM | POA: Diagnosis not present

## 2013-09-25 DIAGNOSIS — Z794 Long term (current) use of insulin: Secondary | ICD-10-CM | POA: Diagnosis not present

## 2013-09-25 DIAGNOSIS — I10 Essential (primary) hypertension: Secondary | ICD-10-CM | POA: Diagnosis not present

## 2013-09-25 DIAGNOSIS — F0390 Unspecified dementia without behavioral disturbance: Secondary | ICD-10-CM | POA: Diagnosis not present

## 2013-09-26 DIAGNOSIS — F0390 Unspecified dementia without behavioral disturbance: Secondary | ICD-10-CM | POA: Diagnosis not present

## 2013-09-26 DIAGNOSIS — I10 Essential (primary) hypertension: Secondary | ICD-10-CM | POA: Diagnosis not present

## 2013-09-26 DIAGNOSIS — Z794 Long term (current) use of insulin: Secondary | ICD-10-CM | POA: Diagnosis not present

## 2013-09-26 DIAGNOSIS — R3915 Urgency of urination: Secondary | ICD-10-CM | POA: Diagnosis not present

## 2013-09-26 DIAGNOSIS — E119 Type 2 diabetes mellitus without complications: Secondary | ICD-10-CM | POA: Diagnosis not present

## 2013-10-02 DIAGNOSIS — Z794 Long term (current) use of insulin: Secondary | ICD-10-CM | POA: Diagnosis not present

## 2013-10-02 DIAGNOSIS — E119 Type 2 diabetes mellitus without complications: Secondary | ICD-10-CM | POA: Diagnosis not present

## 2013-10-02 DIAGNOSIS — I10 Essential (primary) hypertension: Secondary | ICD-10-CM | POA: Diagnosis not present

## 2013-10-02 DIAGNOSIS — F0390 Unspecified dementia without behavioral disturbance: Secondary | ICD-10-CM | POA: Diagnosis not present

## 2013-10-09 DIAGNOSIS — E119 Type 2 diabetes mellitus without complications: Secondary | ICD-10-CM | POA: Diagnosis not present

## 2013-10-09 DIAGNOSIS — Z794 Long term (current) use of insulin: Secondary | ICD-10-CM | POA: Diagnosis not present

## 2013-10-09 DIAGNOSIS — I10 Essential (primary) hypertension: Secondary | ICD-10-CM | POA: Diagnosis not present

## 2013-10-09 DIAGNOSIS — F0390 Unspecified dementia without behavioral disturbance: Secondary | ICD-10-CM | POA: Diagnosis not present

## 2013-10-16 DIAGNOSIS — Z794 Long term (current) use of insulin: Secondary | ICD-10-CM | POA: Diagnosis not present

## 2013-10-16 DIAGNOSIS — F0390 Unspecified dementia without behavioral disturbance: Secondary | ICD-10-CM | POA: Diagnosis not present

## 2013-10-16 DIAGNOSIS — E119 Type 2 diabetes mellitus without complications: Secondary | ICD-10-CM | POA: Diagnosis not present

## 2013-10-16 DIAGNOSIS — I10 Essential (primary) hypertension: Secondary | ICD-10-CM | POA: Diagnosis not present

## 2013-10-23 DIAGNOSIS — I10 Essential (primary) hypertension: Secondary | ICD-10-CM | POA: Diagnosis not present

## 2013-10-23 DIAGNOSIS — Z794 Long term (current) use of insulin: Secondary | ICD-10-CM | POA: Diagnosis not present

## 2013-10-23 DIAGNOSIS — F0390 Unspecified dementia without behavioral disturbance: Secondary | ICD-10-CM | POA: Diagnosis not present

## 2013-10-23 DIAGNOSIS — E119 Type 2 diabetes mellitus without complications: Secondary | ICD-10-CM | POA: Diagnosis not present

## 2013-10-24 DIAGNOSIS — IMO0001 Reserved for inherently not codable concepts without codable children: Secondary | ICD-10-CM | POA: Diagnosis not present

## 2013-10-24 DIAGNOSIS — E1169 Type 2 diabetes mellitus with other specified complication: Secondary | ICD-10-CM | POA: Diagnosis not present

## 2013-10-24 DIAGNOSIS — E11649 Type 2 diabetes mellitus with hypoglycemia without coma: Secondary | ICD-10-CM | POA: Insufficient documentation

## 2013-10-24 DIAGNOSIS — E7800 Pure hypercholesterolemia, unspecified: Secondary | ICD-10-CM | POA: Diagnosis not present

## 2013-10-27 DIAGNOSIS — F0390 Unspecified dementia without behavioral disturbance: Secondary | ICD-10-CM | POA: Diagnosis not present

## 2013-10-27 DIAGNOSIS — E119 Type 2 diabetes mellitus without complications: Secondary | ICD-10-CM | POA: Diagnosis not present

## 2013-10-27 DIAGNOSIS — I10 Essential (primary) hypertension: Secondary | ICD-10-CM | POA: Diagnosis not present

## 2013-10-27 DIAGNOSIS — Z794 Long term (current) use of insulin: Secondary | ICD-10-CM | POA: Diagnosis not present

## 2013-10-31 DIAGNOSIS — Z87440 Personal history of urinary (tract) infections: Secondary | ICD-10-CM | POA: Diagnosis not present

## 2013-10-31 DIAGNOSIS — Z794 Long term (current) use of insulin: Secondary | ICD-10-CM | POA: Diagnosis not present

## 2013-10-31 DIAGNOSIS — I10 Essential (primary) hypertension: Secondary | ICD-10-CM | POA: Diagnosis not present

## 2013-10-31 DIAGNOSIS — F0390 Unspecified dementia without behavioral disturbance: Secondary | ICD-10-CM | POA: Diagnosis not present

## 2013-10-31 DIAGNOSIS — E119 Type 2 diabetes mellitus without complications: Secondary | ICD-10-CM | POA: Diagnosis not present

## 2013-11-02 DIAGNOSIS — F0390 Unspecified dementia without behavioral disturbance: Secondary | ICD-10-CM | POA: Diagnosis not present

## 2013-11-02 DIAGNOSIS — I10 Essential (primary) hypertension: Secondary | ICD-10-CM | POA: Diagnosis not present

## 2013-11-02 DIAGNOSIS — Z794 Long term (current) use of insulin: Secondary | ICD-10-CM | POA: Diagnosis not present

## 2013-11-02 DIAGNOSIS — E119 Type 2 diabetes mellitus without complications: Secondary | ICD-10-CM | POA: Diagnosis not present

## 2013-11-02 DIAGNOSIS — Z87440 Personal history of urinary (tract) infections: Secondary | ICD-10-CM | POA: Diagnosis not present

## 2013-11-06 DIAGNOSIS — I10 Essential (primary) hypertension: Secondary | ICD-10-CM | POA: Diagnosis not present

## 2013-11-06 DIAGNOSIS — Z794 Long term (current) use of insulin: Secondary | ICD-10-CM | POA: Diagnosis not present

## 2013-11-06 DIAGNOSIS — E119 Type 2 diabetes mellitus without complications: Secondary | ICD-10-CM | POA: Diagnosis not present

## 2013-11-06 DIAGNOSIS — Z87440 Personal history of urinary (tract) infections: Secondary | ICD-10-CM | POA: Diagnosis not present

## 2013-11-06 DIAGNOSIS — F0390 Unspecified dementia without behavioral disturbance: Secondary | ICD-10-CM | POA: Diagnosis not present

## 2013-11-13 DIAGNOSIS — E119 Type 2 diabetes mellitus without complications: Secondary | ICD-10-CM | POA: Diagnosis not present

## 2013-11-13 DIAGNOSIS — I10 Essential (primary) hypertension: Secondary | ICD-10-CM | POA: Diagnosis not present

## 2013-11-13 DIAGNOSIS — Z87440 Personal history of urinary (tract) infections: Secondary | ICD-10-CM | POA: Diagnosis not present

## 2013-11-13 DIAGNOSIS — F0390 Unspecified dementia without behavioral disturbance: Secondary | ICD-10-CM | POA: Diagnosis not present

## 2013-11-13 DIAGNOSIS — Z794 Long term (current) use of insulin: Secondary | ICD-10-CM | POA: Diagnosis not present

## 2013-11-21 DIAGNOSIS — IMO0002 Reserved for concepts with insufficient information to code with codable children: Secondary | ICD-10-CM | POA: Diagnosis not present

## 2013-11-21 DIAGNOSIS — E1065 Type 1 diabetes mellitus with hyperglycemia: Secondary | ICD-10-CM | POA: Diagnosis not present

## 2013-11-21 DIAGNOSIS — E785 Hyperlipidemia, unspecified: Secondary | ICD-10-CM | POA: Diagnosis not present

## 2013-11-21 DIAGNOSIS — E1169 Type 2 diabetes mellitus with other specified complication: Secondary | ICD-10-CM | POA: Diagnosis not present

## 2013-11-27 DIAGNOSIS — E785 Hyperlipidemia, unspecified: Secondary | ICD-10-CM | POA: Diagnosis not present

## 2013-11-27 DIAGNOSIS — Z23 Encounter for immunization: Secondary | ICD-10-CM | POA: Diagnosis not present

## 2013-11-27 DIAGNOSIS — D72819 Decreased white blood cell count, unspecified: Secondary | ICD-10-CM | POA: Diagnosis not present

## 2013-11-27 DIAGNOSIS — R413 Other amnesia: Secondary | ICD-10-CM | POA: Diagnosis not present

## 2013-11-27 DIAGNOSIS — E1129 Type 2 diabetes mellitus with other diabetic kidney complication: Secondary | ICD-10-CM | POA: Diagnosis not present

## 2013-11-27 DIAGNOSIS — E441 Mild protein-calorie malnutrition: Secondary | ICD-10-CM | POA: Diagnosis not present

## 2013-11-27 DIAGNOSIS — N058 Unspecified nephritic syndrome with other morphologic changes: Secondary | ICD-10-CM | POA: Diagnosis not present

## 2013-11-27 DIAGNOSIS — I10 Essential (primary) hypertension: Secondary | ICD-10-CM | POA: Diagnosis not present

## 2013-11-28 DIAGNOSIS — E119 Type 2 diabetes mellitus without complications: Secondary | ICD-10-CM | POA: Diagnosis not present

## 2013-11-28 DIAGNOSIS — I10 Essential (primary) hypertension: Secondary | ICD-10-CM | POA: Diagnosis not present

## 2013-11-28 DIAGNOSIS — Z87440 Personal history of urinary (tract) infections: Secondary | ICD-10-CM | POA: Diagnosis not present

## 2013-11-28 DIAGNOSIS — F0390 Unspecified dementia without behavioral disturbance: Secondary | ICD-10-CM | POA: Diagnosis not present

## 2013-11-28 DIAGNOSIS — Z794 Long term (current) use of insulin: Secondary | ICD-10-CM | POA: Diagnosis not present

## 2013-12-04 DIAGNOSIS — Z87440 Personal history of urinary (tract) infections: Secondary | ICD-10-CM | POA: Diagnosis not present

## 2013-12-04 DIAGNOSIS — E1065 Type 1 diabetes mellitus with hyperglycemia: Secondary | ICD-10-CM | POA: Diagnosis not present

## 2013-12-04 DIAGNOSIS — F0390 Unspecified dementia without behavioral disturbance: Secondary | ICD-10-CM | POA: Diagnosis not present

## 2013-12-04 DIAGNOSIS — Z794 Long term (current) use of insulin: Secondary | ICD-10-CM | POA: Diagnosis not present

## 2013-12-04 DIAGNOSIS — E119 Type 2 diabetes mellitus without complications: Secondary | ICD-10-CM | POA: Diagnosis not present

## 2013-12-04 DIAGNOSIS — I10 Essential (primary) hypertension: Secondary | ICD-10-CM | POA: Diagnosis not present

## 2013-12-04 DIAGNOSIS — IMO0002 Reserved for concepts with insufficient information to code with codable children: Secondary | ICD-10-CM | POA: Diagnosis not present

## 2013-12-13 DIAGNOSIS — Z794 Long term (current) use of insulin: Secondary | ICD-10-CM | POA: Diagnosis not present

## 2013-12-13 DIAGNOSIS — I10 Essential (primary) hypertension: Secondary | ICD-10-CM | POA: Diagnosis not present

## 2013-12-13 DIAGNOSIS — Z87440 Personal history of urinary (tract) infections: Secondary | ICD-10-CM | POA: Diagnosis not present

## 2013-12-13 DIAGNOSIS — E119 Type 2 diabetes mellitus without complications: Secondary | ICD-10-CM | POA: Diagnosis not present

## 2013-12-13 DIAGNOSIS — F0390 Unspecified dementia without behavioral disturbance: Secondary | ICD-10-CM | POA: Diagnosis not present

## 2013-12-18 DIAGNOSIS — E119 Type 2 diabetes mellitus without complications: Secondary | ICD-10-CM | POA: Diagnosis not present

## 2013-12-18 DIAGNOSIS — F0390 Unspecified dementia without behavioral disturbance: Secondary | ICD-10-CM | POA: Diagnosis not present

## 2013-12-18 DIAGNOSIS — Z87440 Personal history of urinary (tract) infections: Secondary | ICD-10-CM | POA: Diagnosis not present

## 2013-12-18 DIAGNOSIS — I10 Essential (primary) hypertension: Secondary | ICD-10-CM | POA: Diagnosis not present

## 2013-12-18 DIAGNOSIS — Z794 Long term (current) use of insulin: Secondary | ICD-10-CM | POA: Diagnosis not present

## 2013-12-20 DIAGNOSIS — Z8639 Personal history of other endocrine, nutritional and metabolic disease: Secondary | ICD-10-CM | POA: Diagnosis not present

## 2013-12-20 DIAGNOSIS — E785 Hyperlipidemia, unspecified: Secondary | ICD-10-CM | POA: Diagnosis not present

## 2013-12-20 DIAGNOSIS — D72819 Decreased white blood cell count, unspecified: Secondary | ICD-10-CM | POA: Diagnosis not present

## 2013-12-20 DIAGNOSIS — I10 Essential (primary) hypertension: Secondary | ICD-10-CM | POA: Diagnosis not present

## 2013-12-20 DIAGNOSIS — R609 Edema, unspecified: Secondary | ICD-10-CM | POA: Diagnosis not present

## 2013-12-20 LAB — LIPID PANEL
Cholesterol: 151 mg/dL (ref 0–200)
HDL: 59 mg/dL (ref 35–70)
LDL Cholesterol: 83 mg/dL
Triglycerides: 46 mg/dL (ref 40–160)

## 2013-12-25 DIAGNOSIS — I10 Essential (primary) hypertension: Secondary | ICD-10-CM | POA: Diagnosis not present

## 2013-12-25 DIAGNOSIS — Z87440 Personal history of urinary (tract) infections: Secondary | ICD-10-CM | POA: Diagnosis not present

## 2013-12-25 DIAGNOSIS — E119 Type 2 diabetes mellitus without complications: Secondary | ICD-10-CM | POA: Diagnosis not present

## 2013-12-25 DIAGNOSIS — Z794 Long term (current) use of insulin: Secondary | ICD-10-CM | POA: Diagnosis not present

## 2013-12-25 DIAGNOSIS — F0390 Unspecified dementia without behavioral disturbance: Secondary | ICD-10-CM | POA: Diagnosis not present

## 2013-12-30 DIAGNOSIS — E119 Type 2 diabetes mellitus without complications: Secondary | ICD-10-CM | POA: Diagnosis not present

## 2013-12-30 DIAGNOSIS — Z794 Long term (current) use of insulin: Secondary | ICD-10-CM | POA: Diagnosis not present

## 2013-12-30 DIAGNOSIS — F0390 Unspecified dementia without behavioral disturbance: Secondary | ICD-10-CM | POA: Diagnosis not present

## 2013-12-30 DIAGNOSIS — I10 Essential (primary) hypertension: Secondary | ICD-10-CM | POA: Diagnosis not present

## 2013-12-30 DIAGNOSIS — Z87440 Personal history of urinary (tract) infections: Secondary | ICD-10-CM | POA: Diagnosis not present

## 2014-01-04 DIAGNOSIS — Z794 Long term (current) use of insulin: Secondary | ICD-10-CM | POA: Diagnosis not present

## 2014-01-04 DIAGNOSIS — I10 Essential (primary) hypertension: Secondary | ICD-10-CM | POA: Diagnosis not present

## 2014-01-04 DIAGNOSIS — Z87440 Personal history of urinary (tract) infections: Secondary | ICD-10-CM | POA: Diagnosis not present

## 2014-01-04 DIAGNOSIS — E119 Type 2 diabetes mellitus without complications: Secondary | ICD-10-CM | POA: Diagnosis not present

## 2014-01-04 DIAGNOSIS — F0390 Unspecified dementia without behavioral disturbance: Secondary | ICD-10-CM | POA: Diagnosis not present

## 2014-01-08 DIAGNOSIS — F0390 Unspecified dementia without behavioral disturbance: Secondary | ICD-10-CM | POA: Diagnosis not present

## 2014-01-08 DIAGNOSIS — E119 Type 2 diabetes mellitus without complications: Secondary | ICD-10-CM | POA: Diagnosis not present

## 2014-01-08 DIAGNOSIS — I10 Essential (primary) hypertension: Secondary | ICD-10-CM | POA: Diagnosis not present

## 2014-01-08 DIAGNOSIS — Z794 Long term (current) use of insulin: Secondary | ICD-10-CM | POA: Diagnosis not present

## 2014-01-08 DIAGNOSIS — Z87440 Personal history of urinary (tract) infections: Secondary | ICD-10-CM | POA: Diagnosis not present

## 2014-01-15 DIAGNOSIS — I10 Essential (primary) hypertension: Secondary | ICD-10-CM | POA: Diagnosis not present

## 2014-01-15 DIAGNOSIS — Z87440 Personal history of urinary (tract) infections: Secondary | ICD-10-CM | POA: Diagnosis not present

## 2014-01-15 DIAGNOSIS — F0390 Unspecified dementia without behavioral disturbance: Secondary | ICD-10-CM | POA: Diagnosis not present

## 2014-01-15 DIAGNOSIS — Z794 Long term (current) use of insulin: Secondary | ICD-10-CM | POA: Diagnosis not present

## 2014-01-15 DIAGNOSIS — E119 Type 2 diabetes mellitus without complications: Secondary | ICD-10-CM | POA: Diagnosis not present

## 2014-01-22 DIAGNOSIS — I10 Essential (primary) hypertension: Secondary | ICD-10-CM | POA: Diagnosis not present

## 2014-01-22 DIAGNOSIS — E119 Type 2 diabetes mellitus without complications: Secondary | ICD-10-CM | POA: Diagnosis not present

## 2014-01-22 DIAGNOSIS — Z794 Long term (current) use of insulin: Secondary | ICD-10-CM | POA: Diagnosis not present

## 2014-01-22 DIAGNOSIS — F0390 Unspecified dementia without behavioral disturbance: Secondary | ICD-10-CM | POA: Diagnosis not present

## 2014-01-22 DIAGNOSIS — Z87440 Personal history of urinary (tract) infections: Secondary | ICD-10-CM | POA: Diagnosis not present

## 2014-01-30 DIAGNOSIS — E785 Hyperlipidemia, unspecified: Secondary | ICD-10-CM | POA: Diagnosis not present

## 2014-01-30 DIAGNOSIS — E1065 Type 1 diabetes mellitus with hyperglycemia: Secondary | ICD-10-CM | POA: Diagnosis not present

## 2014-02-05 DIAGNOSIS — E119 Type 2 diabetes mellitus without complications: Secondary | ICD-10-CM | POA: Diagnosis not present

## 2014-02-05 DIAGNOSIS — Z87440 Personal history of urinary (tract) infections: Secondary | ICD-10-CM | POA: Diagnosis not present

## 2014-02-05 DIAGNOSIS — Z794 Long term (current) use of insulin: Secondary | ICD-10-CM | POA: Diagnosis not present

## 2014-02-05 DIAGNOSIS — F0390 Unspecified dementia without behavioral disturbance: Secondary | ICD-10-CM | POA: Diagnosis not present

## 2014-02-05 DIAGNOSIS — I10 Essential (primary) hypertension: Secondary | ICD-10-CM | POA: Diagnosis not present

## 2014-02-12 DIAGNOSIS — Z87440 Personal history of urinary (tract) infections: Secondary | ICD-10-CM | POA: Diagnosis not present

## 2014-02-12 DIAGNOSIS — I10 Essential (primary) hypertension: Secondary | ICD-10-CM | POA: Diagnosis not present

## 2014-02-12 DIAGNOSIS — Z794 Long term (current) use of insulin: Secondary | ICD-10-CM | POA: Diagnosis not present

## 2014-02-12 DIAGNOSIS — E119 Type 2 diabetes mellitus without complications: Secondary | ICD-10-CM | POA: Diagnosis not present

## 2014-02-12 DIAGNOSIS — F0390 Unspecified dementia without behavioral disturbance: Secondary | ICD-10-CM | POA: Diagnosis not present

## 2014-02-16 DIAGNOSIS — Z794 Long term (current) use of insulin: Secondary | ICD-10-CM | POA: Diagnosis not present

## 2014-02-16 DIAGNOSIS — F0390 Unspecified dementia without behavioral disturbance: Secondary | ICD-10-CM | POA: Diagnosis not present

## 2014-02-16 DIAGNOSIS — Z87440 Personal history of urinary (tract) infections: Secondary | ICD-10-CM | POA: Diagnosis not present

## 2014-02-16 DIAGNOSIS — E119 Type 2 diabetes mellitus without complications: Secondary | ICD-10-CM | POA: Diagnosis not present

## 2014-02-16 DIAGNOSIS — I10 Essential (primary) hypertension: Secondary | ICD-10-CM | POA: Diagnosis not present

## 2014-02-20 DIAGNOSIS — E1065 Type 1 diabetes mellitus with hyperglycemia: Secondary | ICD-10-CM | POA: Diagnosis not present

## 2014-02-20 DIAGNOSIS — E785 Hyperlipidemia, unspecified: Secondary | ICD-10-CM | POA: Diagnosis not present

## 2014-02-21 DIAGNOSIS — I10 Essential (primary) hypertension: Secondary | ICD-10-CM | POA: Diagnosis not present

## 2014-02-21 DIAGNOSIS — Z794 Long term (current) use of insulin: Secondary | ICD-10-CM | POA: Diagnosis not present

## 2014-02-21 DIAGNOSIS — E119 Type 2 diabetes mellitus without complications: Secondary | ICD-10-CM | POA: Diagnosis not present

## 2014-02-21 DIAGNOSIS — Z87440 Personal history of urinary (tract) infections: Secondary | ICD-10-CM | POA: Diagnosis not present

## 2014-02-21 DIAGNOSIS — F0390 Unspecified dementia without behavioral disturbance: Secondary | ICD-10-CM | POA: Diagnosis not present

## 2014-02-23 DIAGNOSIS — H35373 Puckering of macula, bilateral: Secondary | ICD-10-CM | POA: Diagnosis not present

## 2014-02-26 DIAGNOSIS — E119 Type 2 diabetes mellitus without complications: Secondary | ICD-10-CM | POA: Diagnosis not present

## 2014-02-26 DIAGNOSIS — I10 Essential (primary) hypertension: Secondary | ICD-10-CM | POA: Diagnosis not present

## 2014-02-26 DIAGNOSIS — Z794 Long term (current) use of insulin: Secondary | ICD-10-CM | POA: Diagnosis not present

## 2014-02-26 DIAGNOSIS — F0390 Unspecified dementia without behavioral disturbance: Secondary | ICD-10-CM | POA: Diagnosis not present

## 2014-02-26 DIAGNOSIS — Z87440 Personal history of urinary (tract) infections: Secondary | ICD-10-CM | POA: Diagnosis not present

## 2014-02-28 DIAGNOSIS — E109 Type 1 diabetes mellitus without complications: Secondary | ICD-10-CM | POA: Diagnosis not present

## 2014-02-28 DIAGNOSIS — F0390 Unspecified dementia without behavioral disturbance: Secondary | ICD-10-CM | POA: Diagnosis not present

## 2014-02-28 DIAGNOSIS — Z87440 Personal history of urinary (tract) infections: Secondary | ICD-10-CM | POA: Diagnosis not present

## 2014-02-28 DIAGNOSIS — I10 Essential (primary) hypertension: Secondary | ICD-10-CM | POA: Diagnosis not present

## 2014-03-05 DIAGNOSIS — Z87440 Personal history of urinary (tract) infections: Secondary | ICD-10-CM | POA: Diagnosis not present

## 2014-03-05 DIAGNOSIS — I10 Essential (primary) hypertension: Secondary | ICD-10-CM | POA: Diagnosis not present

## 2014-03-05 DIAGNOSIS — E109 Type 1 diabetes mellitus without complications: Secondary | ICD-10-CM | POA: Diagnosis not present

## 2014-03-05 DIAGNOSIS — F0390 Unspecified dementia without behavioral disturbance: Secondary | ICD-10-CM | POA: Diagnosis not present

## 2014-03-06 DIAGNOSIS — R350 Frequency of micturition: Secondary | ICD-10-CM | POA: Diagnosis not present

## 2014-03-06 DIAGNOSIS — Z87440 Personal history of urinary (tract) infections: Secondary | ICD-10-CM | POA: Diagnosis not present

## 2014-03-06 DIAGNOSIS — I10 Essential (primary) hypertension: Secondary | ICD-10-CM | POA: Diagnosis not present

## 2014-03-06 DIAGNOSIS — F0390 Unspecified dementia without behavioral disturbance: Secondary | ICD-10-CM | POA: Diagnosis not present

## 2014-03-06 DIAGNOSIS — E109 Type 1 diabetes mellitus without complications: Secondary | ICD-10-CM | POA: Diagnosis not present

## 2014-03-13 DIAGNOSIS — I10 Essential (primary) hypertension: Secondary | ICD-10-CM | POA: Diagnosis not present

## 2014-03-13 DIAGNOSIS — E109 Type 1 diabetes mellitus without complications: Secondary | ICD-10-CM | POA: Diagnosis not present

## 2014-03-13 DIAGNOSIS — F0390 Unspecified dementia without behavioral disturbance: Secondary | ICD-10-CM | POA: Diagnosis not present

## 2014-03-13 DIAGNOSIS — Z87440 Personal history of urinary (tract) infections: Secondary | ICD-10-CM | POA: Diagnosis not present

## 2014-03-19 DIAGNOSIS — I10 Essential (primary) hypertension: Secondary | ICD-10-CM | POA: Diagnosis not present

## 2014-03-19 DIAGNOSIS — F0390 Unspecified dementia without behavioral disturbance: Secondary | ICD-10-CM | POA: Diagnosis not present

## 2014-03-19 DIAGNOSIS — Z87440 Personal history of urinary (tract) infections: Secondary | ICD-10-CM | POA: Diagnosis not present

## 2014-03-19 DIAGNOSIS — E109 Type 1 diabetes mellitus without complications: Secondary | ICD-10-CM | POA: Diagnosis not present

## 2014-03-19 IMAGING — CR DG CHEST 1V PORT
1 series · 1 of 1 positions shown · non-contrast
Comparison: none

REASON FOR EXAM: line placement
COMMENTS:

PROCEDURE:     DXR - DXR PORTABLE CHEST SINGLE VIEW  - January 23, 2012 [DATE]
RESULT:     Comparison: None

[ap]
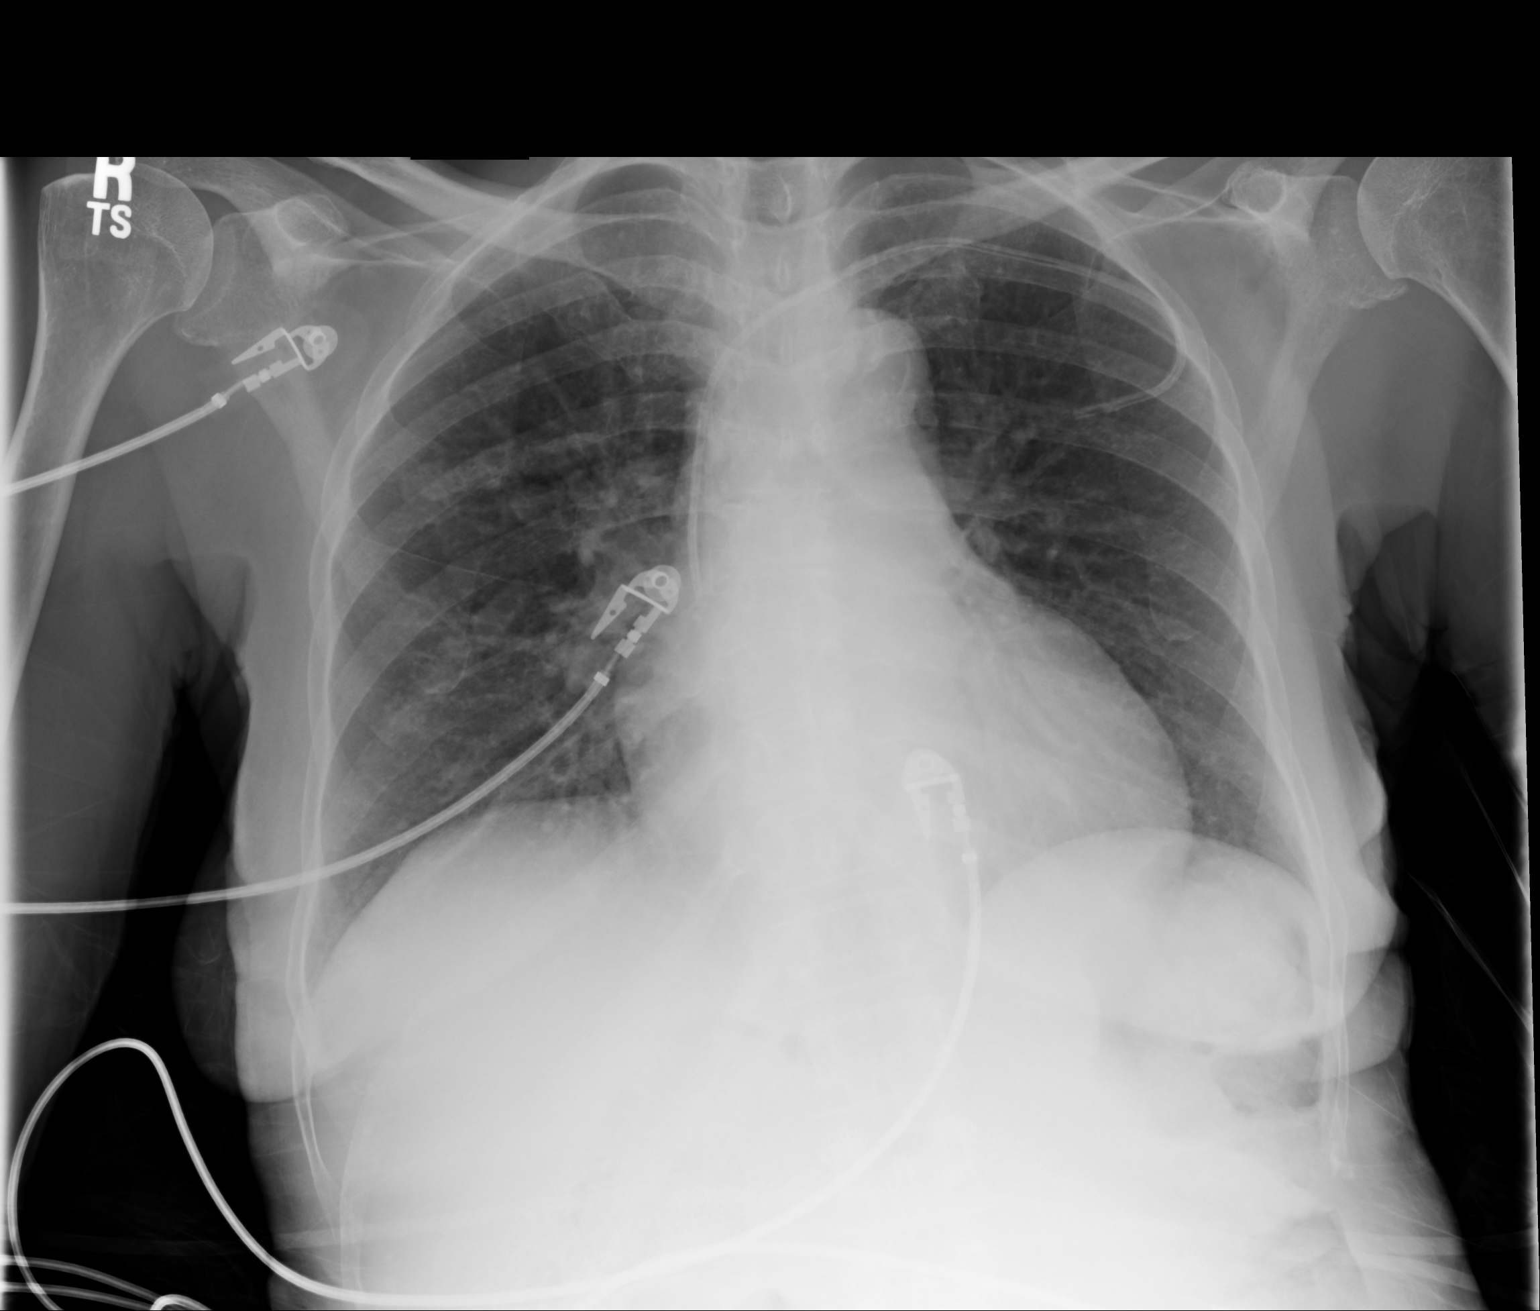

[1 of 1 positions shown; findings below may reference images not displayed]

FINDINGS: Single portable AP chest radiograph is provided. There is a left-sided
central venous catheter which has been retracted with the tip projecting
over the SVC. There is bilateral interstitial prominence. There is no focal
parenchymal opacity, pleural effusion, or pneumothorax. Normal
cardiomediastinal silhouette. The osseous structures are unremarkable.
IMPRESSION: Please see above.

[REDACTED]

## 2014-03-19 IMAGING — CR DG CHEST 1V PORT
1 series · 1 of 1 positions shown · non-contrast
Comparison: none

REASON FOR EXAM: line placement
COMMENTS:

PROCEDURE:     DXR - DXR PORTABLE CHEST SINGLE VIEW  - January 23, 2012 [DATE]
RESULT:     Comparison: None

[ap]
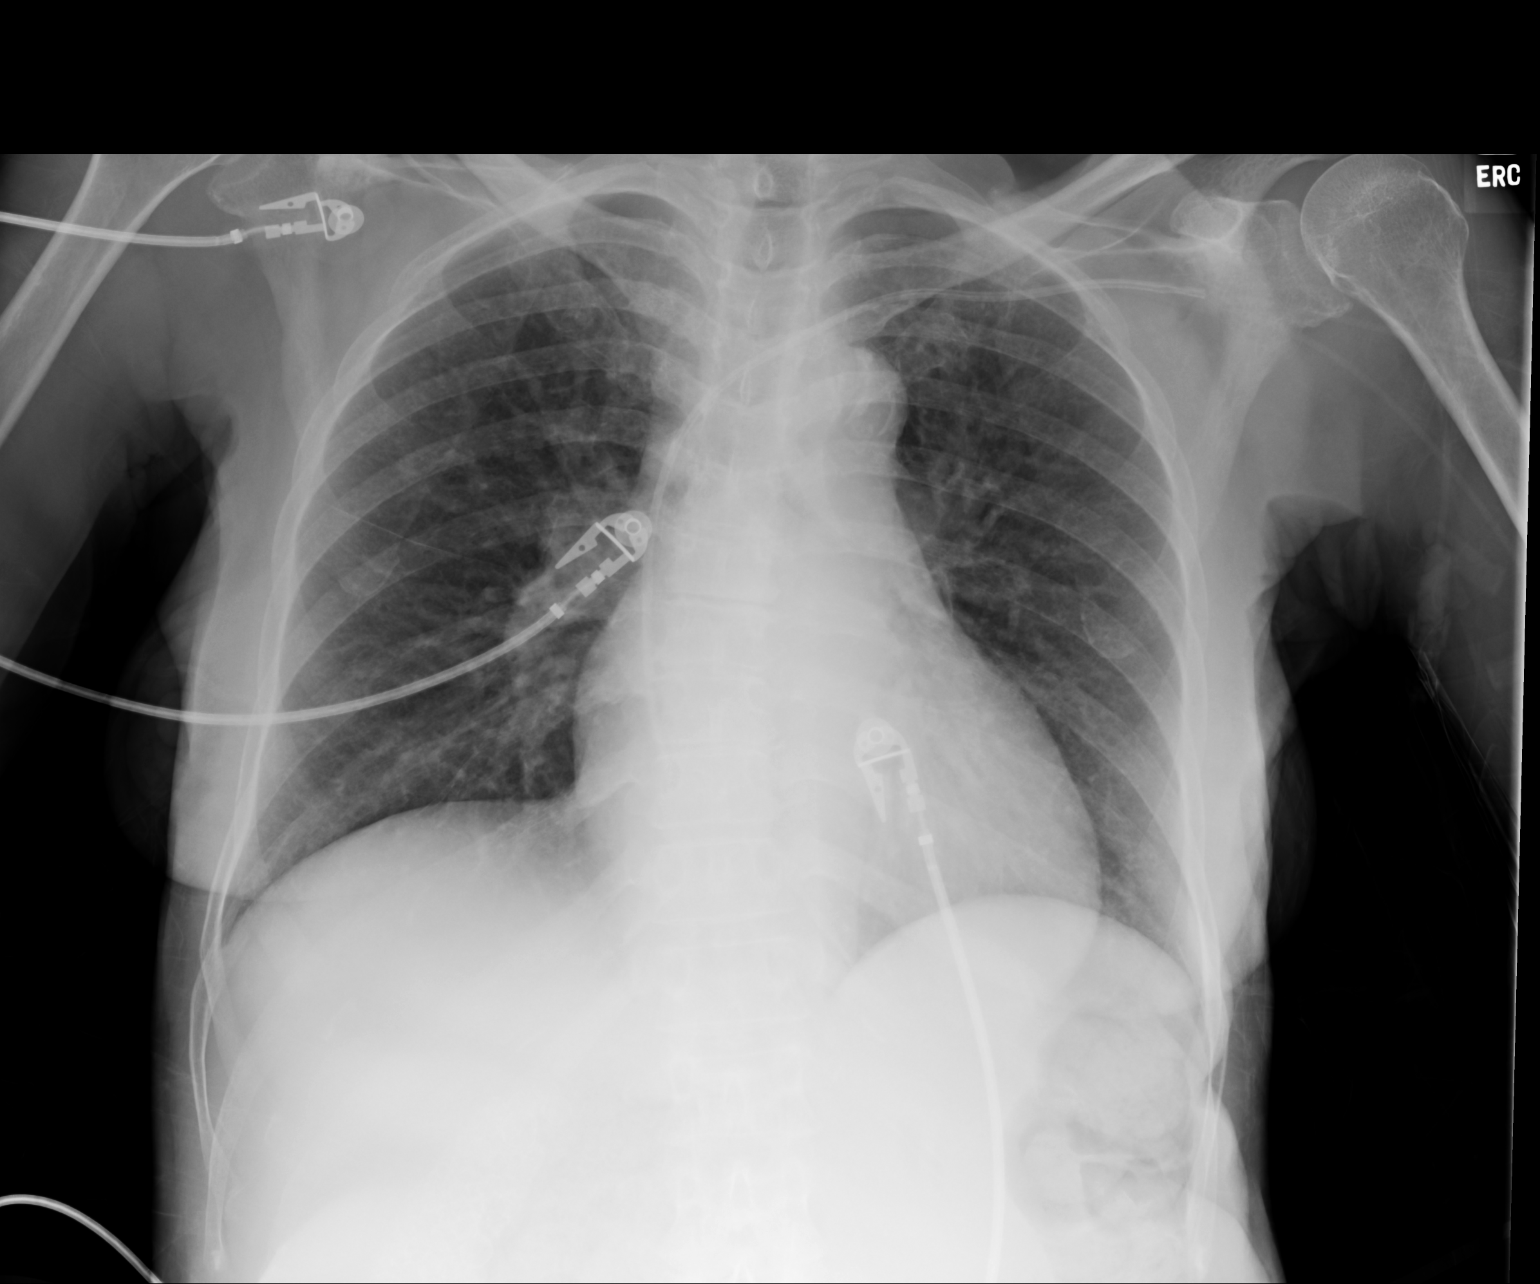

[1 of 1 positions shown; findings below may reference images not displayed]

FINDINGS: Single portable AP chest radiograph is provided. There is a left-sided
central venous catheter in satisfactory position. There is mild bilateral
interstitial thickening. There is no focal parenchymal opacity, pleural
effusion, or pneumothorax. Normal cardiomediastinal silhouette. The osseous
structures are unremarkable.
IMPRESSION: Please see above.

[REDACTED]

## 2014-03-26 DIAGNOSIS — F0390 Unspecified dementia without behavioral disturbance: Secondary | ICD-10-CM | POA: Diagnosis not present

## 2014-03-26 DIAGNOSIS — E109 Type 1 diabetes mellitus without complications: Secondary | ICD-10-CM | POA: Diagnosis not present

## 2014-03-26 DIAGNOSIS — Z87440 Personal history of urinary (tract) infections: Secondary | ICD-10-CM | POA: Diagnosis not present

## 2014-03-26 DIAGNOSIS — I10 Essential (primary) hypertension: Secondary | ICD-10-CM | POA: Diagnosis not present

## 2014-03-29 DIAGNOSIS — Z23 Encounter for immunization: Secondary | ICD-10-CM | POA: Diagnosis not present

## 2014-03-29 DIAGNOSIS — R413 Other amnesia: Secondary | ICD-10-CM | POA: Diagnosis not present

## 2014-03-29 DIAGNOSIS — H3321 Serous retinal detachment, right eye: Secondary | ICD-10-CM | POA: Diagnosis not present

## 2014-03-29 DIAGNOSIS — E785 Hyperlipidemia, unspecified: Secondary | ICD-10-CM | POA: Diagnosis not present

## 2014-03-29 DIAGNOSIS — R809 Proteinuria, unspecified: Secondary | ICD-10-CM | POA: Diagnosis not present

## 2014-03-29 DIAGNOSIS — I10 Essential (primary) hypertension: Secondary | ICD-10-CM | POA: Diagnosis not present

## 2014-03-29 DIAGNOSIS — E1121 Type 2 diabetes mellitus with diabetic nephropathy: Secondary | ICD-10-CM | POA: Diagnosis not present

## 2014-03-29 DIAGNOSIS — E441 Mild protein-calorie malnutrition: Secondary | ICD-10-CM | POA: Diagnosis not present

## 2014-04-03 DIAGNOSIS — E1065 Type 1 diabetes mellitus with hyperglycemia: Secondary | ICD-10-CM | POA: Diagnosis not present

## 2014-04-03 DIAGNOSIS — E11649 Type 2 diabetes mellitus with hypoglycemia without coma: Secondary | ICD-10-CM | POA: Diagnosis not present

## 2014-04-03 DIAGNOSIS — Z79899 Other long term (current) drug therapy: Secondary | ICD-10-CM | POA: Diagnosis not present

## 2014-04-03 DIAGNOSIS — E785 Hyperlipidemia, unspecified: Secondary | ICD-10-CM | POA: Diagnosis not present

## 2014-04-09 DIAGNOSIS — Z87440 Personal history of urinary (tract) infections: Secondary | ICD-10-CM | POA: Diagnosis not present

## 2014-04-09 DIAGNOSIS — E109 Type 1 diabetes mellitus without complications: Secondary | ICD-10-CM | POA: Diagnosis not present

## 2014-04-09 DIAGNOSIS — I10 Essential (primary) hypertension: Secondary | ICD-10-CM | POA: Diagnosis not present

## 2014-04-09 DIAGNOSIS — F0390 Unspecified dementia without behavioral disturbance: Secondary | ICD-10-CM | POA: Diagnosis not present

## 2014-04-16 DIAGNOSIS — Z87440 Personal history of urinary (tract) infections: Secondary | ICD-10-CM | POA: Diagnosis not present

## 2014-04-16 DIAGNOSIS — I10 Essential (primary) hypertension: Secondary | ICD-10-CM | POA: Diagnosis not present

## 2014-04-16 DIAGNOSIS — F0390 Unspecified dementia without behavioral disturbance: Secondary | ICD-10-CM | POA: Diagnosis not present

## 2014-04-16 DIAGNOSIS — E109 Type 1 diabetes mellitus without complications: Secondary | ICD-10-CM | POA: Diagnosis not present

## 2014-04-24 DIAGNOSIS — Z87440 Personal history of urinary (tract) infections: Secondary | ICD-10-CM | POA: Diagnosis not present

## 2014-04-24 DIAGNOSIS — E109 Type 1 diabetes mellitus without complications: Secondary | ICD-10-CM | POA: Diagnosis not present

## 2014-04-24 DIAGNOSIS — I10 Essential (primary) hypertension: Secondary | ICD-10-CM | POA: Diagnosis not present

## 2014-04-24 DIAGNOSIS — F0390 Unspecified dementia without behavioral disturbance: Secondary | ICD-10-CM | POA: Diagnosis not present

## 2014-04-25 DIAGNOSIS — Z87440 Personal history of urinary (tract) infections: Secondary | ICD-10-CM | POA: Diagnosis not present

## 2014-04-25 DIAGNOSIS — I10 Essential (primary) hypertension: Secondary | ICD-10-CM | POA: Diagnosis not present

## 2014-04-25 DIAGNOSIS — E109 Type 1 diabetes mellitus without complications: Secondary | ICD-10-CM | POA: Diagnosis not present

## 2014-04-25 DIAGNOSIS — R3915 Urgency of urination: Secondary | ICD-10-CM | POA: Diagnosis not present

## 2014-04-25 DIAGNOSIS — R350 Frequency of micturition: Secondary | ICD-10-CM | POA: Diagnosis not present

## 2014-04-25 DIAGNOSIS — F0390 Unspecified dementia without behavioral disturbance: Secondary | ICD-10-CM | POA: Diagnosis not present

## 2014-04-25 LAB — HEMOGLOBIN A1C: Hgb A1c MFr Bld: 10.6 % — AB (ref 4.0–6.0)

## 2014-04-29 DIAGNOSIS — I10 Essential (primary) hypertension: Secondary | ICD-10-CM | POA: Diagnosis not present

## 2014-04-29 DIAGNOSIS — E109 Type 1 diabetes mellitus without complications: Secondary | ICD-10-CM | POA: Diagnosis not present

## 2014-04-29 DIAGNOSIS — Z87440 Personal history of urinary (tract) infections: Secondary | ICD-10-CM | POA: Diagnosis not present

## 2014-04-29 DIAGNOSIS — R350 Frequency of micturition: Secondary | ICD-10-CM | POA: Diagnosis not present

## 2014-04-29 DIAGNOSIS — R3915 Urgency of urination: Secondary | ICD-10-CM | POA: Diagnosis not present

## 2014-04-29 DIAGNOSIS — F0390 Unspecified dementia without behavioral disturbance: Secondary | ICD-10-CM | POA: Diagnosis not present

## 2014-04-30 DIAGNOSIS — E109 Type 1 diabetes mellitus without complications: Secondary | ICD-10-CM | POA: Diagnosis not present

## 2014-04-30 DIAGNOSIS — R350 Frequency of micturition: Secondary | ICD-10-CM | POA: Diagnosis not present

## 2014-04-30 DIAGNOSIS — Z87440 Personal history of urinary (tract) infections: Secondary | ICD-10-CM | POA: Diagnosis not present

## 2014-04-30 DIAGNOSIS — F0390 Unspecified dementia without behavioral disturbance: Secondary | ICD-10-CM | POA: Diagnosis not present

## 2014-04-30 DIAGNOSIS — I10 Essential (primary) hypertension: Secondary | ICD-10-CM | POA: Diagnosis not present

## 2014-04-30 DIAGNOSIS — R3915 Urgency of urination: Secondary | ICD-10-CM | POA: Diagnosis not present

## 2014-05-07 DIAGNOSIS — R350 Frequency of micturition: Secondary | ICD-10-CM | POA: Diagnosis not present

## 2014-05-07 DIAGNOSIS — I10 Essential (primary) hypertension: Secondary | ICD-10-CM | POA: Diagnosis not present

## 2014-05-07 DIAGNOSIS — R3915 Urgency of urination: Secondary | ICD-10-CM | POA: Diagnosis not present

## 2014-05-07 DIAGNOSIS — F0390 Unspecified dementia without behavioral disturbance: Secondary | ICD-10-CM | POA: Diagnosis not present

## 2014-05-07 DIAGNOSIS — E109 Type 1 diabetes mellitus without complications: Secondary | ICD-10-CM | POA: Diagnosis not present

## 2014-05-07 DIAGNOSIS — Z87440 Personal history of urinary (tract) infections: Secondary | ICD-10-CM | POA: Diagnosis not present

## 2014-05-14 DIAGNOSIS — I10 Essential (primary) hypertension: Secondary | ICD-10-CM | POA: Diagnosis not present

## 2014-05-14 DIAGNOSIS — F0390 Unspecified dementia without behavioral disturbance: Secondary | ICD-10-CM | POA: Diagnosis not present

## 2014-05-14 DIAGNOSIS — R350 Frequency of micturition: Secondary | ICD-10-CM | POA: Diagnosis not present

## 2014-05-14 DIAGNOSIS — R3915 Urgency of urination: Secondary | ICD-10-CM | POA: Diagnosis not present

## 2014-05-14 DIAGNOSIS — E109 Type 1 diabetes mellitus without complications: Secondary | ICD-10-CM | POA: Diagnosis not present

## 2014-05-14 DIAGNOSIS — Z87440 Personal history of urinary (tract) infections: Secondary | ICD-10-CM | POA: Diagnosis not present

## 2014-05-15 DIAGNOSIS — E11649 Type 2 diabetes mellitus with hypoglycemia without coma: Secondary | ICD-10-CM | POA: Diagnosis not present

## 2014-05-15 DIAGNOSIS — Z79899 Other long term (current) drug therapy: Secondary | ICD-10-CM | POA: Diagnosis not present

## 2014-05-15 DIAGNOSIS — E785 Hyperlipidemia, unspecified: Secondary | ICD-10-CM | POA: Diagnosis not present

## 2014-05-15 DIAGNOSIS — E1065 Type 1 diabetes mellitus with hyperglycemia: Secondary | ICD-10-CM | POA: Diagnosis not present

## 2014-05-21 DIAGNOSIS — R3915 Urgency of urination: Secondary | ICD-10-CM | POA: Diagnosis not present

## 2014-05-21 DIAGNOSIS — E109 Type 1 diabetes mellitus without complications: Secondary | ICD-10-CM | POA: Diagnosis not present

## 2014-05-21 DIAGNOSIS — F0390 Unspecified dementia without behavioral disturbance: Secondary | ICD-10-CM | POA: Diagnosis not present

## 2014-05-21 DIAGNOSIS — R350 Frequency of micturition: Secondary | ICD-10-CM | POA: Diagnosis not present

## 2014-05-21 DIAGNOSIS — I10 Essential (primary) hypertension: Secondary | ICD-10-CM | POA: Diagnosis not present

## 2014-05-21 DIAGNOSIS — Z87440 Personal history of urinary (tract) infections: Secondary | ICD-10-CM | POA: Diagnosis not present

## 2014-05-28 DIAGNOSIS — Z87440 Personal history of urinary (tract) infections: Secondary | ICD-10-CM | POA: Diagnosis not present

## 2014-05-28 DIAGNOSIS — R3915 Urgency of urination: Secondary | ICD-10-CM | POA: Diagnosis not present

## 2014-05-28 DIAGNOSIS — E109 Type 1 diabetes mellitus without complications: Secondary | ICD-10-CM | POA: Diagnosis not present

## 2014-05-28 DIAGNOSIS — I10 Essential (primary) hypertension: Secondary | ICD-10-CM | POA: Diagnosis not present

## 2014-05-28 DIAGNOSIS — F0390 Unspecified dementia without behavioral disturbance: Secondary | ICD-10-CM | POA: Diagnosis not present

## 2014-05-28 DIAGNOSIS — R350 Frequency of micturition: Secondary | ICD-10-CM | POA: Diagnosis not present

## 2014-06-04 DIAGNOSIS — R350 Frequency of micturition: Secondary | ICD-10-CM | POA: Diagnosis not present

## 2014-06-04 DIAGNOSIS — I10 Essential (primary) hypertension: Secondary | ICD-10-CM | POA: Diagnosis not present

## 2014-06-04 DIAGNOSIS — E109 Type 1 diabetes mellitus without complications: Secondary | ICD-10-CM | POA: Diagnosis not present

## 2014-06-04 DIAGNOSIS — F0390 Unspecified dementia without behavioral disturbance: Secondary | ICD-10-CM | POA: Diagnosis not present

## 2014-06-04 DIAGNOSIS — Z87440 Personal history of urinary (tract) infections: Secondary | ICD-10-CM | POA: Diagnosis not present

## 2014-06-04 DIAGNOSIS — R3915 Urgency of urination: Secondary | ICD-10-CM | POA: Diagnosis not present

## 2014-06-11 DIAGNOSIS — Z87440 Personal history of urinary (tract) infections: Secondary | ICD-10-CM | POA: Diagnosis not present

## 2014-06-11 DIAGNOSIS — F0390 Unspecified dementia without behavioral disturbance: Secondary | ICD-10-CM | POA: Diagnosis not present

## 2014-06-11 DIAGNOSIS — I10 Essential (primary) hypertension: Secondary | ICD-10-CM | POA: Diagnosis not present

## 2014-06-11 DIAGNOSIS — R3915 Urgency of urination: Secondary | ICD-10-CM | POA: Diagnosis not present

## 2014-06-11 DIAGNOSIS — E109 Type 1 diabetes mellitus without complications: Secondary | ICD-10-CM | POA: Diagnosis not present

## 2014-06-11 DIAGNOSIS — R350 Frequency of micturition: Secondary | ICD-10-CM | POA: Diagnosis not present

## 2014-06-18 DIAGNOSIS — R350 Frequency of micturition: Secondary | ICD-10-CM | POA: Diagnosis not present

## 2014-06-18 DIAGNOSIS — F0390 Unspecified dementia without behavioral disturbance: Secondary | ICD-10-CM | POA: Diagnosis not present

## 2014-06-18 DIAGNOSIS — E109 Type 1 diabetes mellitus without complications: Secondary | ICD-10-CM | POA: Diagnosis not present

## 2014-06-18 DIAGNOSIS — I10 Essential (primary) hypertension: Secondary | ICD-10-CM | POA: Diagnosis not present

## 2014-06-18 DIAGNOSIS — Z87440 Personal history of urinary (tract) infections: Secondary | ICD-10-CM | POA: Diagnosis not present

## 2014-06-18 DIAGNOSIS — R3915 Urgency of urination: Secondary | ICD-10-CM | POA: Diagnosis not present

## 2014-06-26 DIAGNOSIS — I10 Essential (primary) hypertension: Secondary | ICD-10-CM | POA: Diagnosis not present

## 2014-06-26 DIAGNOSIS — R3915 Urgency of urination: Secondary | ICD-10-CM | POA: Diagnosis not present

## 2014-06-26 DIAGNOSIS — R350 Frequency of micturition: Secondary | ICD-10-CM | POA: Diagnosis not present

## 2014-06-26 DIAGNOSIS — F0390 Unspecified dementia without behavioral disturbance: Secondary | ICD-10-CM | POA: Diagnosis not present

## 2014-06-26 DIAGNOSIS — E109 Type 1 diabetes mellitus without complications: Secondary | ICD-10-CM | POA: Diagnosis not present

## 2014-06-26 DIAGNOSIS — Z87440 Personal history of urinary (tract) infections: Secondary | ICD-10-CM | POA: Diagnosis not present

## 2014-06-26 NOTE — H&P (Signed)
PATIENT NAME:  [name], [name] MR#:  [id number] DATE OF BIRTH:  1941-02-18  DATE OF ADMISSION:  01/23/2012  ADDENDUM  The patient continues to have hypotension with maps in the mid 27X with systolic blood pressures in the 80s/40s. The patient is a little bit more lethargic. I explained to the family the need of obtaining a central line to give patient Levophed. I went to check to the patient again and again she looks more lethargic. We are going to start her on a Levophed drip and send her to the unit. Risk and benefits explained of the patient including pneumothorax, infection, pain, hemothorax, risk of arrhythmia. Family is aware of the situation and they consent for the patient. We are agreeable that the patient needs to be in the Intensive Care Unit. I had a long discussion with the family about the potential problems that the patient could have and she is going to also go to the CT scan for what I want her blood pressure to be better. Central line subclavian left side has been placed without complication. Please see procedure note.   CRITICAL CARE TIME/TOTAL TIME WITH THIS PATIENT TODAY: 80 minutes.   ____________________________ [place] Sink, MD rsg:cms D: 01/23/2012 22:23:35 ET T: 01/24/2012 10:20:47 ET JOB#: [id number]  cc: [place] Sink, MD, <Dictator> Makynzi [place] America Riester MD ELECTRONICALLY SIGNED 02/01/2012 13:09

## 2014-06-26 NOTE — Discharge Summary (Signed)
PATIENT NAME:  [name], [name] MR#:  [id number] DATE OF BIRTH:  09-16-1940  DATE OF ADMISSION:  01/23/2012 DATE OF DISCHARGE:  01/25/2012  PRESENTING COMPLAINT: Uncontrolled sugars.   DISCHARGE DIAGNOSES:  1. Diabetic ketoacidosis in the setting of type 2 diabetes.  2. Acute dehydration with renal failure and hypotension, resolved.  3. Electrolyte abnormality with hypokalemia and hypophosphatemia in the setting of diabetic ketoacidosis.  4. Incidental finding of cholelithiasis.   CONDITION ON DISCHARGE: Fair.   MEDICATIONS:  1. Kombiglyze XR 2.07/998 extended release 2 tablets once a day.  2. Vitamin D3 1000 international units daily.  3. Aspirin 81 mg daily.  4. Fish oil 1000 mg daily.  5. Metoprolol 12.5 mg b.i.d.  6. Pravastatin your  home dose as before.  7. Tribenzor 1 tablet daily.  8. NovoLog sliding scale.  9. Lantus 30 units sub-Q daily at breakfast.  10. Neutra-Phos 250 mg one packet q.i.d. for two days.   FOLLOW UP: Patient to follow up with Dr. Ancil Boozer, Thursday, 01/28/2012 at 11:30 a.m.   DIET: ADA 1800 calorie.   LABORATORY, DIAGNOSTIC AND RADIOLOGICAL DATA: Labs at discharge: glucose 301, BUN 19, creatinine 0.79, sodium 130, potassium 3.9, phosphorus 1.1. Phosphorus on 11/17 was 1.8. LFTs within normal limits except SGOT of 71.   CT of the abdomen and pelvis without contrast showed cholelithiasis. A large amount stool throughout the colon.   Chest x-ray no acute disease of the chest.   Blood cultures negative in 36 hours.   Urinalysis negative for urinary tract infection. Urine culture is mixed bacterial organisms.   Anion gap at admission was 22.   BRIEF SUMMARY OF HOSPITAL COURSE: [name] is a pleasant 74 year old African American female with history of type 2 diabetes, hypertension and dyslipidemia comes in with severe debility and was found to have diabetic ketoacidosis with associated hypotension and dehydration. She was admitted with:  1. Acute diabetic  ketoacidosis in the setting of type 2 diabetes. Patient was started on aggressive IV fluids, on insulin drip, remained in the Critical Care Unit overnight. Her insulin drip was weaned off and started back on her home dose Lantus 30 units along with sliding scale. Her anion gap closed. Patient was also resumed with her Kombiglyze her home dose. She is advised to keep log of her sugars and review it with PCP to adjust Lantus dosing. Patient had two episodes of hypoglycemia in early hours of the morning. She does take a bedtime snack at home which she was asked to continue.  2. Hypotension due to intravascular fluid depletion along with dehydration in the setting of DKA. Patient was given aggressive IV fluids, however, she did require IV Levophed for several hours which was weaned off. No source of infection was identified. CT abdomen was negative except incidental note of gallstones and constipation.  3. Superficial periumbilical rash without any abscess or palpable swelling. White count was normal. Rash appeared to be hyperpigmented with few red papular nodules. Mild fungal rash.  4. Acute renal failure with hypokalemia and hypophosphatemia in the setting of DKA. Patient received aggressive IV fluids, electrolytes were replaced except phosphorus went up from 1.4 to 1.8, prior to discharge it was down to 1.1. Patient received one bolus of K-Phos and was given Neutra-Phos packs to be taken for two more days after discharge. Her labs will be repeated by her primary care physician, Dr. Ancil Boozer, on her visit next week.  5. Hospital stay otherwise remained stable. Patient remained a FULL CODE.  TIME SPENT: 40 minutes.  ____________________________ Hart [place] Posey Pronto, MD sap:cms D: 01/26/2012 06:55:52 ET T: 01/26/2012 10:07:40 ET JOB#: [id number]  cc: Alijah Hyde A. Posey Pronto, MD, <Dictator> Bethena Roys. Ancil Boozer, [place] MD ELECTRONICALLY SIGNED 01/28/2012 20:11

## 2014-06-26 NOTE — H&P (Signed)
PATIENT NAME:  [name], [name] MR#:  [id number] DATE OF BIRTH:  06-23-1940  DATE OF ADMISSION:  01/23/2012  REASON FOR ADMISSION: Hyperglycemia/DKA.   REFERRING PHYSICIAN: Dr. Francene Castle  PRIMARY CARE PHYSICIAN:  Dr. Steele Sizer   HISTORY OF PRESENT ILLNESS: Ms. Caraveo is a very nice 74 year old female who has history of diabetes, which has been overall under difficult control. The patient states that she has been having blood sugars that go high on and off but for the most part they are well controlled.  The patient had been in her normal state of health up until last week when she developed some diarrhea that lasted several days. She has not had diarrhea for the past couple of days, but she has  also has not had any bowel movements. After that she started having slight abdominal pain that increased today and got really bad.  The patient states that she has not been able to eat or drink much, only breakfast, but nothing else today. She has significant abdominal pain which is epigastric and mesogastric, 5 to 7 out of 10, cramp-like, occasionally sharp, does not get better with movement and it has not radiated.  The patient states that she has been very nauseous but has not been vomiting. She states that she is urinating very frequently. The patient has no strength, she has been very weak, and for that reason she decided to come to the ER. In the ER, her blood sugars are in the 800s and her blood pressure is starting to come down from 80s over 40s, for the most. The patient has received 2 liters of normal saline and NR1 is running and her blood pressure is still very low. The patient has her family in the room. She wants to be a FULL CODE and she will accept any procedures and interventions if necessary. We are going to transfer her to the Critical Care Unit.   REVIEW OF SYSTEMS: CONSTITUTIONAL: The patient denies fever, but she has been feeling warm, denies any previous fatigue but today she has been  very weak. No weight loss or weight gain. EYES: No blurry vision. She had cataract surgery in the past. Negative for glaucoma. ENT: No tinnitus. No difficulty swallowing. No postnasal drip. RESPIRATORY: No cough, wheezing, or pneumonia. CARDIOVASCULAR: No chest pain, orthopnea, syncope, or palpitations. GI: Positive nausea. Positive abdominal pain. Negative melena, hematochezia, or ulcers. No jaundice or rectal bleeding.  GU: Increased urinary frequency. Negative dysuria. GYN: No breast masses. No vaginal bleeding. ENDOCRINE: Positive polyuria, positive polydipsia. No thyroid problems or heat or cold intolerance. HEMATOLOGIC/LYMPHATIC: No anemia or bleeding. SKIN: Without any significant acne, rashes, or lesions but she has a cyst and cellulitis in the periumbilical area that hurts to palpation. HEME/LYMPH: No significant anemia, bleeding, or easy bruising. MUSCULOSKELETAL: No significant pain in her neck, back, shoulders, knees, and hips. NEUROLOGIC: No numbness, no cerebrovascular accidents, no ataxia. PSYCHIATRIC: No depression or anxiety.   PAST MEDICAL HISTORY:  1. Hypertension.  2. Diabetes diagnosed 31 years ago.  3. Dyslipidemia.  4. Cataracts.   ALLERGIES: Carrots and penicillin.   PAST SURGICAL HISTORY:  1. Cesarean section. 2. Cataract surgery.   FAMILY HISTORY: Positive for myocardial infarction in her mother. Negative cancer. Negative hypertension. Positive for diabetes.   SOCIAL HISTORY: She lives with her husband and her grandson. She does not smoke, does not drink. She is retired.   MEDICATIONS:  1. Vitamin D 1000 units once daily. 2. Tribenzor once daily.  3.  Pravastatin 1 p.o. daily.  4. NovoLog insulin sliding scale. 5. Metoprolol 25-mg tablet, half tablet once a day.  6. Lantus 30 to 40 units every night. 7. Kombiglyze XR 2.5 mg/1000 mg, 2 tablets once a day. 8. Fish oil 1000 mg once a day.  9. Aspirin 81 mg once a day.   PHYSICAL EXAMINATION:  VITAL SIGNS: Blood  pressure 86/47, pulse 74, respiratory rate 18, temperature 98.   GENERAL: The patient is looking critically ill, very dehydrated, lying down in the Trendelenburg position. She is lethargic but she awakes and answer questions appropriately.   HEENT: Her pupils are equal and reactive. Extraocular movements are intact. Mucosa are very dry. No oral lesions. No jaundiced sclerae. No pallor.   NECK: Supple.  There is JVD about 4 cm but likely due to Trendelenburg position. No masses. No carotid bruits. Normal range of motion.   CARDIOVASCULAR: Regular rate and rhythm. Positive systolic ejection murmur of 2/6. The patient knows about this murmur, has had it all her life. No rubs or gallops.   LUNGS: Clear without any wheezing or crepitus. No use of accessory muscles. No dullness to percussion.   ABDOMEN: Soft. It is tender to palpation in the epigastric and mesogastric areas. There is no significant rebound, but the patient has a little bit increased tension due to the pain. There is no guarding.   Bowel sounds are positive. There is periumbilical erythema, cellulitis without any drainage. There is a cyst in the periumbilical area that is tender to palpitation.   GENITAL: Negative for external lesions or groin erythema.   EXTREMITIES: No edema, no cyanosis, no clubbing. Pulses +2. Capillary refill about 4 to 5 seconds. There are signs of lack of perfusion with cold extremities.   NEUROLOGIC: Cranial nerves II through XII intact. No focal abnormalities. The patient is slightly lethargic.   PSYCHOLOGIC: Negative for anxiety. The patient slightly lethargic with flat affect.   LYMPHATIC: Negative for lymphadenopathy in the neck or supraclavicular areas.   SKIN: As mentioned above, erythema periumbilical area, likely cellulitis.   MUSCULOSKELETAL: No joint deformity. The patient states occasionally her knees are swollen but not today.   EKG: Normal sinus rhythm. No ST depression or T wave  elevation.   LABORATORY AND RADIOLOGICAL DATA:  Blood sugars on admission 852, creatinine 1.53, CO2 16, anion gap is 22, which is elevated. Hemoglobin A1c 10. LFTs within normal limits. Hemoglobin 11.9, white count 6.4, platelets 169. Urinalysis has hazy, straw color. White blood cells are 5, red blood cells 1. There is trace leukocyte esterase.  Venous pH 7.17. CO2 41.   CHEST X-RAY: No acute abnormality.   ASSESSMENT AND PLAN: 74 year old female with hypertension, diabetes, and dyslipidemia who comes in with abdominal pain,  cellulitis periumbilically, and diabetic ketoacidosis with severe hypotension.  1. Diabetic ketoacidosis: We are going to admit her to the Critical Care Unit for insulin drip. Follow up electrolytes, follow up anion gap. Continue insulin drip until her anion gap closes  and continue normal saline until her blood sugar is around 250. After that change to D5.  2. Hypotension: The patient has severe hypotension. The cause could be intravascular volume depletion due to her dehydration from her diabetic ketoacidosis. We are going to give her more fluid.  If after the third bolus her blood pressure is not stable we are going to start her on a Levophed drip. I am going to put a central line in if that is the case and send her  to the Critical Care Unit. Other than that we will need to give her more fluids and follow electrolytes. I cannot rule out the possibility of an infection. I am going to treat her cellulitis with cefazolin.  The patient is allergic to penicillin but not a critical reaction so a cephalosporin should be okay. Cellulitis:  The patient has a cyst.  The cyst can be drained once she is a little bit more stable. Continue cefazolin.  3. Abdominal pain: Unknown etiology, likely due to DKA but since the patient is so hypotensive could be starting a septic picture. Although her white count is normal and there is no temperature, still there is the possibility for which a CT scan of  the abdomen is going to be ordered, especially since the patient had diarrhea last week all week until a couple of days ago.  4. Hypertension: Hold blood pressure medications.  5. Acute kidney injury: Likely due to intravascular volume depletion, creatinine likely prerenal.  6. Dyslipidemia: Hold medications. 7. Diabetes: We are going to hold her medications and treat her DKA for now.  8. CODE STATUS: The patient is a FULL CODE.  9. Deep vein thrombosis prophylaxis with heparin. PPI for GI prophylaxis.   ____________________________ [place] Sink, MD rsg:bjt D:  01/23/2012 21:46:07 ET          T: 01/24/2012 09:57:22 ET         JOB#: [id number]  cc: Bethena Roys. Ancil Boozer, MD Roselie Awkward America Mcdill MD ELECTRONICALLY SIGNED 02/01/2012 13:10

## 2014-06-28 DIAGNOSIS — Z87440 Personal history of urinary (tract) infections: Secondary | ICD-10-CM | POA: Diagnosis not present

## 2014-06-28 DIAGNOSIS — R350 Frequency of micturition: Secondary | ICD-10-CM | POA: Diagnosis not present

## 2014-06-28 DIAGNOSIS — E109 Type 1 diabetes mellitus without complications: Secondary | ICD-10-CM | POA: Diagnosis not present

## 2014-06-28 DIAGNOSIS — F0390 Unspecified dementia without behavioral disturbance: Secondary | ICD-10-CM | POA: Diagnosis not present

## 2014-06-28 DIAGNOSIS — R3915 Urgency of urination: Secondary | ICD-10-CM | POA: Diagnosis not present

## 2014-06-28 DIAGNOSIS — I10 Essential (primary) hypertension: Secondary | ICD-10-CM | POA: Diagnosis not present

## 2014-07-02 DIAGNOSIS — R350 Frequency of micturition: Secondary | ICD-10-CM | POA: Diagnosis not present

## 2014-07-02 DIAGNOSIS — I10 Essential (primary) hypertension: Secondary | ICD-10-CM | POA: Diagnosis not present

## 2014-07-02 DIAGNOSIS — E109 Type 1 diabetes mellitus without complications: Secondary | ICD-10-CM | POA: Diagnosis not present

## 2014-07-02 DIAGNOSIS — F0390 Unspecified dementia without behavioral disturbance: Secondary | ICD-10-CM | POA: Diagnosis not present

## 2014-07-02 DIAGNOSIS — Z87440 Personal history of urinary (tract) infections: Secondary | ICD-10-CM | POA: Diagnosis not present

## 2014-07-02 DIAGNOSIS — R3915 Urgency of urination: Secondary | ICD-10-CM | POA: Diagnosis not present

## 2014-07-09 DIAGNOSIS — Z87440 Personal history of urinary (tract) infections: Secondary | ICD-10-CM | POA: Diagnosis not present

## 2014-07-09 DIAGNOSIS — E109 Type 1 diabetes mellitus without complications: Secondary | ICD-10-CM | POA: Diagnosis not present

## 2014-07-09 DIAGNOSIS — I10 Essential (primary) hypertension: Secondary | ICD-10-CM | POA: Diagnosis not present

## 2014-07-09 DIAGNOSIS — F0390 Unspecified dementia without behavioral disturbance: Secondary | ICD-10-CM | POA: Diagnosis not present

## 2014-07-09 DIAGNOSIS — R3915 Urgency of urination: Secondary | ICD-10-CM | POA: Diagnosis not present

## 2014-07-09 DIAGNOSIS — R350 Frequency of micturition: Secondary | ICD-10-CM | POA: Diagnosis not present

## 2014-07-17 DIAGNOSIS — R3915 Urgency of urination: Secondary | ICD-10-CM | POA: Diagnosis not present

## 2014-07-17 DIAGNOSIS — Z87440 Personal history of urinary (tract) infections: Secondary | ICD-10-CM | POA: Diagnosis not present

## 2014-07-17 DIAGNOSIS — F0390 Unspecified dementia without behavioral disturbance: Secondary | ICD-10-CM | POA: Diagnosis not present

## 2014-07-17 DIAGNOSIS — E109 Type 1 diabetes mellitus without complications: Secondary | ICD-10-CM | POA: Diagnosis not present

## 2014-07-17 DIAGNOSIS — R350 Frequency of micturition: Secondary | ICD-10-CM | POA: Diagnosis not present

## 2014-07-17 DIAGNOSIS — I10 Essential (primary) hypertension: Secondary | ICD-10-CM | POA: Diagnosis not present

## 2014-07-24 DIAGNOSIS — R3915 Urgency of urination: Secondary | ICD-10-CM | POA: Diagnosis not present

## 2014-07-24 DIAGNOSIS — I10 Essential (primary) hypertension: Secondary | ICD-10-CM | POA: Diagnosis not present

## 2014-07-24 DIAGNOSIS — Z87440 Personal history of urinary (tract) infections: Secondary | ICD-10-CM | POA: Diagnosis not present

## 2014-07-24 DIAGNOSIS — E109 Type 1 diabetes mellitus without complications: Secondary | ICD-10-CM | POA: Diagnosis not present

## 2014-07-24 DIAGNOSIS — F0390 Unspecified dementia without behavioral disturbance: Secondary | ICD-10-CM | POA: Diagnosis not present

## 2014-07-24 DIAGNOSIS — R350 Frequency of micturition: Secondary | ICD-10-CM | POA: Diagnosis not present

## 2014-07-30 DIAGNOSIS — Z87440 Personal history of urinary (tract) infections: Secondary | ICD-10-CM | POA: Diagnosis not present

## 2014-07-30 DIAGNOSIS — I10 Essential (primary) hypertension: Secondary | ICD-10-CM | POA: Diagnosis not present

## 2014-07-30 DIAGNOSIS — R350 Frequency of micturition: Secondary | ICD-10-CM | POA: Diagnosis not present

## 2014-07-30 DIAGNOSIS — F0390 Unspecified dementia without behavioral disturbance: Secondary | ICD-10-CM | POA: Diagnosis not present

## 2014-07-30 DIAGNOSIS — E109 Type 1 diabetes mellitus without complications: Secondary | ICD-10-CM | POA: Diagnosis not present

## 2014-07-30 DIAGNOSIS — E1065 Type 1 diabetes mellitus with hyperglycemia: Secondary | ICD-10-CM | POA: Diagnosis not present

## 2014-07-30 DIAGNOSIS — R3915 Urgency of urination: Secondary | ICD-10-CM | POA: Diagnosis not present

## 2014-08-06 DIAGNOSIS — I10 Essential (primary) hypertension: Secondary | ICD-10-CM | POA: Diagnosis not present

## 2014-08-06 DIAGNOSIS — R350 Frequency of micturition: Secondary | ICD-10-CM | POA: Diagnosis not present

## 2014-08-06 DIAGNOSIS — F0390 Unspecified dementia without behavioral disturbance: Secondary | ICD-10-CM | POA: Diagnosis not present

## 2014-08-06 DIAGNOSIS — E109 Type 1 diabetes mellitus without complications: Secondary | ICD-10-CM | POA: Diagnosis not present

## 2014-08-06 DIAGNOSIS — Z87440 Personal history of urinary (tract) infections: Secondary | ICD-10-CM | POA: Diagnosis not present

## 2014-08-06 DIAGNOSIS — R3915 Urgency of urination: Secondary | ICD-10-CM | POA: Diagnosis not present

## 2014-08-13 DIAGNOSIS — R3915 Urgency of urination: Secondary | ICD-10-CM | POA: Diagnosis not present

## 2014-08-13 DIAGNOSIS — R350 Frequency of micturition: Secondary | ICD-10-CM | POA: Diagnosis not present

## 2014-08-13 DIAGNOSIS — F0390 Unspecified dementia without behavioral disturbance: Secondary | ICD-10-CM | POA: Diagnosis not present

## 2014-08-13 DIAGNOSIS — I10 Essential (primary) hypertension: Secondary | ICD-10-CM | POA: Diagnosis not present

## 2014-08-13 DIAGNOSIS — Z87440 Personal history of urinary (tract) infections: Secondary | ICD-10-CM | POA: Diagnosis not present

## 2014-08-13 DIAGNOSIS — E109 Type 1 diabetes mellitus without complications: Secondary | ICD-10-CM | POA: Diagnosis not present

## 2014-08-21 DIAGNOSIS — E1065 Type 1 diabetes mellitus with hyperglycemia: Secondary | ICD-10-CM | POA: Diagnosis not present

## 2014-08-21 DIAGNOSIS — E785 Hyperlipidemia, unspecified: Secondary | ICD-10-CM | POA: Diagnosis not present

## 2014-08-22 DIAGNOSIS — I10 Essential (primary) hypertension: Secondary | ICD-10-CM | POA: Diagnosis not present

## 2014-08-22 DIAGNOSIS — R3915 Urgency of urination: Secondary | ICD-10-CM | POA: Diagnosis not present

## 2014-08-22 DIAGNOSIS — E109 Type 1 diabetes mellitus without complications: Secondary | ICD-10-CM | POA: Diagnosis not present

## 2014-08-22 DIAGNOSIS — F0390 Unspecified dementia without behavioral disturbance: Secondary | ICD-10-CM | POA: Diagnosis not present

## 2014-08-22 DIAGNOSIS — Z87440 Personal history of urinary (tract) infections: Secondary | ICD-10-CM | POA: Diagnosis not present

## 2014-08-22 DIAGNOSIS — R350 Frequency of micturition: Secondary | ICD-10-CM | POA: Diagnosis not present

## 2014-09-06 DIAGNOSIS — H35373 Puckering of macula, bilateral: Secondary | ICD-10-CM | POA: Diagnosis not present

## 2014-09-18 ENCOUNTER — Other Ambulatory Visit: Payer: Self-pay

## 2014-09-18 NOTE — Telephone Encounter (Signed)
Pharmacy requesting 90 day supply

## 2014-09-19 ENCOUNTER — Encounter: Payer: Self-pay | Admitting: Family Medicine

## 2014-09-19 DIAGNOSIS — G3184 Mild cognitive impairment, so stated: Secondary | ICD-10-CM | POA: Insufficient documentation

## 2014-09-19 DIAGNOSIS — IMO0001 Reserved for inherently not codable concepts without codable children: Secondary | ICD-10-CM | POA: Insufficient documentation

## 2014-09-19 DIAGNOSIS — R809 Proteinuria, unspecified: Secondary | ICD-10-CM | POA: Insufficient documentation

## 2014-09-19 DIAGNOSIS — K8020 Calculus of gallbladder without cholecystitis without obstruction: Secondary | ICD-10-CM | POA: Insufficient documentation

## 2014-09-19 DIAGNOSIS — E1159 Type 2 diabetes mellitus with other circulatory complications: Secondary | ICD-10-CM | POA: Insufficient documentation

## 2014-09-19 DIAGNOSIS — E785 Hyperlipidemia, unspecified: Secondary | ICD-10-CM | POA: Insufficient documentation

## 2014-09-19 DIAGNOSIS — I10 Essential (primary) hypertension: Secondary | ICD-10-CM | POA: Insufficient documentation

## 2014-09-19 DIAGNOSIS — H02409 Unspecified ptosis of unspecified eyelid: Secondary | ICD-10-CM | POA: Insufficient documentation

## 2014-09-19 DIAGNOSIS — G5600 Carpal tunnel syndrome, unspecified upper limb: Secondary | ICD-10-CM | POA: Insufficient documentation

## 2014-09-19 DIAGNOSIS — H3320 Serous retinal detachment, unspecified eye: Secondary | ICD-10-CM | POA: Insufficient documentation

## 2014-09-19 DIAGNOSIS — Z780 Asymptomatic menopausal state: Secondary | ICD-10-CM | POA: Insufficient documentation

## 2014-09-19 DIAGNOSIS — M810 Age-related osteoporosis without current pathological fracture: Secondary | ICD-10-CM | POA: Insufficient documentation

## 2014-09-19 DIAGNOSIS — E440 Moderate protein-calorie malnutrition: Secondary | ICD-10-CM | POA: Insufficient documentation

## 2014-09-19 DIAGNOSIS — E1029 Type 1 diabetes mellitus with other diabetic kidney complication: Secondary | ICD-10-CM | POA: Insufficient documentation

## 2014-09-20 ENCOUNTER — Ambulatory Visit: Payer: Self-pay | Admitting: Family Medicine

## 2014-09-26 ENCOUNTER — Encounter: Payer: Self-pay | Admitting: Family Medicine

## 2014-09-26 ENCOUNTER — Ambulatory Visit (INDEPENDENT_AMBULATORY_CARE_PROVIDER_SITE_OTHER): Payer: Medicare Other | Admitting: Family Medicine

## 2014-09-26 VITALS — BP 122/56 | HR 75 | Temp 98.0°F | Resp 16 | Ht 61.0 in | Wt 129.0 lb

## 2014-09-26 DIAGNOSIS — I10 Essential (primary) hypertension: Secondary | ICD-10-CM | POA: Diagnosis not present

## 2014-09-26 DIAGNOSIS — N189 Chronic kidney disease, unspecified: Secondary | ICD-10-CM

## 2014-09-26 DIAGNOSIS — M810 Age-related osteoporosis without current pathological fracture: Secondary | ICD-10-CM | POA: Diagnosis not present

## 2014-09-26 DIAGNOSIS — E785 Hyperlipidemia, unspecified: Secondary | ICD-10-CM

## 2014-09-26 DIAGNOSIS — E1022 Type 1 diabetes mellitus with diabetic chronic kidney disease: Secondary | ICD-10-CM | POA: Diagnosis not present

## 2014-09-26 DIAGNOSIS — E559 Vitamin D deficiency, unspecified: Secondary | ICD-10-CM | POA: Diagnosis not present

## 2014-09-26 DIAGNOSIS — G3184 Mild cognitive impairment, so stated: Secondary | ICD-10-CM | POA: Diagnosis not present

## 2014-09-26 MED ORDER — OLMESARTAN-AMLODIPINE-HCTZ 40-5-25 MG PO TABS: 1.0000 | ORAL_TABLET | Freq: Every day | ORAL | Status: DC

## 2014-09-26 MED ORDER — ATORVASTATIN CALCIUM 80 MG PO TABS: 80.0000 mg | ORAL_TABLET | Freq: Every day | ORAL | Status: DC

## 2014-09-26 MED ORDER — VITAMIN D 50 MCG (2000 UT) PO TABS: 2000.0000 [IU] | ORAL_TABLET | Freq: Every day | ORAL | Status: DC

## 2014-09-26 NOTE — Progress Notes (Signed)
Name: [name]   MRN: [id number]    DOB: [birth year]   Date:09/26/2014       Progress Note  Subjective  Chief Complaint  Chief Complaint  Patient presents with  . Diabetes    checks BG 3x qd low-55 high-484  . Hypertension    edema  . Hyperlipidemia    HPI  HTN: taking Tribenzor, she has some swelling on both feet, otherwise no other side effects of medication, she has reached the donut hole.   Hyperlipidemia: taking high dose Lipitor, compliant with medication, denies side effects  DMI: sees Dr. Gabriel Carina, she is not sure of the last hgbA1C reading, but is improving. Medication is very expensive and she is now in the donut hole and would like some samples of Novolog Mix 70/30, taking statin, aspirin and ARB also, she has history of proteinuria  Mild Cognitive Impairment: she is still forgetful, but able to perform ADL without assistance, she is also able to do instrumental activity of daily living.    Patient Active Problem List   Diagnosis Date Noted  . Aortic sclerosis 09/19/2014  . Benign hypertension 09/19/2014  . Calculus of gallbladder 09/19/2014  . Carpal tunnel syndrome 09/19/2014  . Detached retina 09/19/2014  . Type 1 diabetes mellitus with renal manifestations 09/19/2014  . Dyslipidemia 09/19/2014  . Hypophosphatemia 09/19/2014  . Mild cognitive impairment with memory loss 09/19/2014  . Microalbuminuria 09/19/2014  . OP (osteoporosis) 09/19/2014  . Menopause 09/19/2014  . Ptosis of eyelid 09/19/2014  . Urge incontinence 08/01/2013  . Chronic LBP 08/01/2013  . Dementia   . Vitamin D deficiency 02/24/2007    Past Surgical History  Procedure Laterality Date  . Eye surgery    . Breast surgery  1970    Family History  Problem Relation Age of Onset  . Alzheimer's disease Mother   . Diabetes Mother   . Arthritis Mother   . Diabetes Father   . Hypertension Father   . Hyperlipidemia Father   . Stroke Brother   . Multiple sclerosis Daughter      History   Social History  . Marital Status: Single    Spouse Name: N/A  . Number of Children: N/A  . Years of Education: N/A   Occupational History  . Not on file.   Social History Main Topics  . Smoking status: Never Smoker   . Smokeless tobacco: Never Used  . Alcohol Use: No  . Drug Use: No  . Sexual Activity: Not Currently   Other Topics Concern  . Not on file   Social History Narrative     Current outpatient prescriptions:  .  aspirin EC 81 MG tablet, Take 81 mg by mouth daily., Disp: , Rfl:  .  atorvastatin (LIPITOR) 80 MG tablet, Take 1 tablet (80 mg total) by mouth daily., Disp: 30 tablet, Rfl: 5 .  blood glucose meter kit and supplies, , Disp: , Rfl:  .  Calcium Carbonate-Vitamin D (CALCIUM-VITAMIN D) 600-125 MG-UNIT TABS, Take 1 tablet by mouth daily., Disp: , Rfl:  .  insulin aspart protamine- aspart (NOVOLOG MIX 70/30) (70-30) 100 UNIT/ML injection, Inject 32 units before breakfast and 6 units before lunch and 12u before supper as directed., Disp: , Rfl:  .  Olmesartan-Amlodipine-HCTZ (TRIBENZOR) 40-5-25 MG TABS, Take 1 tablet by mouth daily., Disp: 30 tablet, Rfl: 5 .  Cholecalciferol (VITAMIN D) 2000 UNITS tablet, Take 1 tablet (2,000 Units total) by mouth daily., Disp: 30 tablet, Rfl: 5  Allergies  Allergen  Reactions  . Colesevelam Hcl     scotomas  . Penicillins Itching     ROS  Constitutional: Negative for fever or significant weight change.  Respiratory: Negative for cough and shortness of breath.   Cardiovascular: Negative for chest pain or palpitations.  Gastrointestinal: Negative for abdominal pain, no bowel changes.  Musculoskeletal: Negative for gait problem or joint swelling.  Skin: Negative for rash.  Neurological: Negative for dizziness or headache.  No other specific complaints in a complete review of systems (except as listed in HPI above).  Objective  Filed Vitals:   09/26/14 1449  BP: 122/56  Pulse: 75  Temp: 98 F (36.7  C)  TempSrc: Oral  Resp: 16  Height: '5\' 1"'  (1.549 m)  Weight: 129 lb (58.514 kg)  SpO2: 94%    Body mass index is 24.39 kg/(m^2).  Physical Exam  Constitutional: Patient appears well-developed and well-nourished.  No distress. Bilateral ptosis - per patient all her life Eyes:  No scleral icterus. PERL Neck: Normal range of motion. Neck supple. Cardiovascular: Normal rate, regular rhythm and Systolic ejection heart murmur heard. BLE edema on feet  Pulmonary/Chest: Effort normal and breath sounds normal. No respiratory distress. Abdominal: Soft.  There is no tenderness. Psychiatric: Patient has a normal mood and affect. behavior is normal. Judgment and thought content normal.  Diabetic Foot Exam: Diabetic Foot Exam - Simple   Simple Foot Form  Visual Inspection  See comments:  Yes  Sensation Testing  Intact to touch and monofilament testing bilaterally:  Yes  Pulse Check  Posterior Tibialis and Dorsalis pulse intact bilaterally:  Yes  Comments  Except for small callus formation of left 5th toe      PHQ2/9: Depression screen PHQ 2/9 09/26/2014  Decreased Interest 0  Down, Depressed, Hopeless 0  PHQ - 2 Score 0     Fall Risk: Fall Risk  09/26/2014  Falls in the past year? No      Assessment & Plan  1. Benign hypertension Controlled  - Olmesartan-Amlodipine-HCTZ (TRIBENZOR) 40-5-25 MG TABS; Take 1 tablet by mouth daily.  Dispense: 30 tablet; Refill: 5  2. Type 1 diabetes mellitus with diabetic chronic kidney disease Continue follow up with Dr. Gabriel Carina, advised patient to contact [facility] to get free medication while on donut hole  3. Vitamin D deficiency Resume vitamin D supplementation - Cholecalciferol (VITAMIN D) 2000 UNITS tablet; Take 1 tablet (2,000 Units total) by mouth daily.  Dispense: 30 tablet; Refill: 5  4. Dyslipidemia Had labs done recently by Dr. Gabriel Carina - atorvastatin (LIPITOR) 80 MG tablet; Take 1 tablet (80 mg total) by mouth daily.   Dispense: 30 tablet; Refill: 5

## 2014-10-25 ENCOUNTER — Ambulatory Visit (INDEPENDENT_AMBULATORY_CARE_PROVIDER_SITE_OTHER): Payer: Medicare Other | Admitting: Family Medicine

## 2014-10-25 ENCOUNTER — Telehealth: Payer: Self-pay | Admitting: Family Medicine

## 2014-10-25 ENCOUNTER — Encounter: Payer: Self-pay | Admitting: Family Medicine

## 2014-10-25 VITALS — BP 168/80 | HR 79 | Temp 97.5°F | Resp 18 | Ht 62.5 in | Wt 128.9 lb

## 2014-10-25 DIAGNOSIS — M25532 Pain in left wrist: Secondary | ICD-10-CM

## 2014-10-25 DIAGNOSIS — M79632 Pain in left forearm: Secondary | ICD-10-CM | POA: Diagnosis not present

## 2014-10-25 MED ORDER — TRAMADOL HCL 50 MG PO TABS: 50.0000 mg | ORAL_TABLET | Freq: Three times a day (TID) | ORAL | Status: DC | PRN

## 2014-10-25 NOTE — Telephone Encounter (Signed)
She needs high dose flu shot

## 2014-10-25 NOTE — Progress Notes (Signed)
Name: [name]   MRN: [id number]    DOB: Feb 11, 1941   Date:10/25/2014       Progress Note  Subjective  Chief Complaint  Chief Complaint  Patient presents with  . Wrist Pain    Left Hand- Onset-Sunday-Denies any Trauma, Painful and swollen radiates up to upper arm, has taken Bengay cream on site and Aleve for pain with no relief.    HPI  Left Wrist Pain: acute onset, progressively worsening of left wrist pain with swelling, patient states yesterday was the worse pain. No trauma that she can recall. No change in her diet. No previous history of gout or inflammatory arthritis.   Patient Active Problem List   Diagnosis Date Noted  . Aortic sclerosis 09/19/2014  . Benign hypertension 09/19/2014  . Calculus of gallbladder 09/19/2014  . Carpal tunnel syndrome 09/19/2014  . Detached retina 09/19/2014  . Type 1 diabetes mellitus with renal manifestations 09/19/2014  . Dyslipidemia 09/19/2014  . Hypophosphatemia 09/19/2014  . Mild cognitive impairment with memory loss 09/19/2014  . Microalbuminuria 09/19/2014  . OP (osteoporosis) 09/19/2014  . Menopause 09/19/2014  . Ptosis of eyelid 09/19/2014  . Urge incontinence 08/01/2013  . Chronic LBP 08/01/2013  . Vitamin D deficiency 02/24/2007    Past Surgical History  Procedure Laterality Date  . Eye surgery    . Breast surgery  1970    Family History  Problem Relation Age of Onset  . Alzheimer's disease Mother   . Diabetes Mother   . Arthritis Mother   . Diabetes Father   . Hypertension Father   . Hyperlipidemia Father   . Stroke Brother   . Multiple sclerosis Daughter     Social History   Social History  . Marital Status: Single    Spouse Name: N/A  . Number of Children: N/A  . Years of Education: N/A   Occupational History  . Not on file.   Social History Main Topics  . Smoking status: Never Smoker   . Smokeless tobacco: Never Used  . Alcohol Use: No  . Drug Use: No  . Sexual Activity: Not Currently    Other Topics Concern  . Not on file   Social History Narrative     Current outpatient prescriptions:  .  aspirin EC 81 MG tablet, Take 81 mg by mouth daily., Disp: , Rfl:  .  atorvastatin (LIPITOR) 80 MG tablet, Take 1 tablet (80 mg total) by mouth daily., Disp: 30 tablet, Rfl: 5 .  blood glucose meter kit and supplies, , Disp: , Rfl:  .  Calcium Carbonate-Vitamin D (CALCIUM-VITAMIN D) 600-125 MG-UNIT TABS, Take 1 tablet by mouth daily., Disp: , Rfl:  .  Cholecalciferol (VITAMIN D) 2000 UNITS tablet, Take 1 tablet (2,000 Units total) by mouth daily., Disp: 30 tablet, Rfl: 5 .  [employer] D3 2000 UNITS CAPS, Take 1 tablet by mouth daily., Disp: , Rfl: 5 .  insulin aspart protamine- aspart (NOVOLOG MIX 70/30) (70-30) 100 UNIT/ML injection, Inject 32 units before breakfast and 6 units before lunch and 12u before supper as directed., Disp: , Rfl:  .  Olmesartan-Amlodipine-HCTZ (TRIBENZOR) 40-5-25 MG TABS, Take 1 tablet by mouth daily., Disp: 30 tablet, Rfl: 5  Allergies  Allergen Reactions  . Colesevelam Hcl     scotomas  . Penicillins Itching     ROS  Constitutional: Negative for fever or weight change.  Respiratory: Negative for cough and shortness of breath.   Cardiovascular: Negative for chest pain or palpitations.  Gastrointestinal: Negative  for abdominal pain, no bowel changes.  Musculoskeletal: Negative for gait problem positive for  joint swelling.  Skin: Negative for rash.  Neurological: Negative for dizziness or headache.  No other specific complaints in a complete review of systems (except as listed in HPI above).  Objective  Filed Vitals:   10/25/14 1133  BP: 168/80  Pulse: 79  Temp: 97.5 F (36.4 C)  TempSrc: Oral  Resp: 18  Height: 5' 2.5" (1.588 m)  Weight: 128 lb 14.4 oz (58.469 kg)  SpO2: 95%    Body mass index is 23.19 kg/(m^2).  Physical Exam  Constitutional: Patient appears well-developed and well-nourished.  No distress.  HEENT: head atraumatic,  normocephalic, pupils equal and reactive to light,  neck supple, throat within normal limits Cardiovascular: Normal rate, regular rhythm and normal heart sounds.  No murmur heard. No BLE edema. Pulmonary/Chest: Effort normal and breath sounds normal. No respiratory distress. Abdominal: Soft.  There is no tenderness. Muscular Skeletal: swollen and tender left wrist, worse pain is on medial aspect, decrease rom of wrist with passive or active motion  PHQ2/9: Depression screen PHQ 2/9 09/26/2014  Decreased Interest 0  Down, Depressed, Hopeless 0  PHQ - 2 Score 0    Fall Risk: Fall Risk  09/26/2014  Falls in the past year? No      Assessment & Plan  1. Left wrist pain Differential diagnosis, gout, bacterial infection, fracture, inflammation  - Uric acid - Sed Rate (ESR) - traMADol (ULTRAM) 50 MG tablet; Take 1 tablet (50 mg total) by mouth every 8 (eight) hours as needed.  Dispense: 30 tablet; Refill: 0 - Ambulatory referral to Orthopedic Surgery

## 2014-10-26 ENCOUNTER — Telehealth: Payer: Self-pay

## 2014-10-26 LAB — SEDIMENTATION RATE: Sed Rate: 13 mm/hr (ref 0–40)

## 2014-10-26 LAB — URIC ACID: Uric Acid: 2.5 mg/dL (ref 2.5–7.1)

## 2014-10-26 NOTE — Telephone Encounter (Signed)
Pt states they are not sure what is going on but gave her pain meds and brace and she will f/u in 1 month

## 2014-11-13 DIAGNOSIS — E1065 Type 1 diabetes mellitus with hyperglycemia: Secondary | ICD-10-CM | POA: Diagnosis not present

## 2014-11-20 DIAGNOSIS — E1065 Type 1 diabetes mellitus with hyperglycemia: Secondary | ICD-10-CM | POA: Diagnosis not present

## 2014-11-20 DIAGNOSIS — E785 Hyperlipidemia, unspecified: Secondary | ICD-10-CM | POA: Diagnosis not present

## 2014-11-20 DIAGNOSIS — E10649 Type 1 diabetes mellitus with hypoglycemia without coma: Secondary | ICD-10-CM | POA: Diagnosis not present

## 2015-01-01 ENCOUNTER — Ambulatory Visit (INDEPENDENT_AMBULATORY_CARE_PROVIDER_SITE_OTHER): Payer: Medicare Other

## 2015-01-01 DIAGNOSIS — Z23 Encounter for immunization: Secondary | ICD-10-CM | POA: Diagnosis not present

## 2015-01-22 ENCOUNTER — Other Ambulatory Visit: Payer: Self-pay | Admitting: Family Medicine

## 2015-01-22 NOTE — Telephone Encounter (Signed)
Patient requesting refill. 

## 2015-03-06 DIAGNOSIS — H35373 Puckering of macula, bilateral: Secondary | ICD-10-CM | POA: Diagnosis not present

## 2015-03-12 DIAGNOSIS — E1065 Type 1 diabetes mellitus with hyperglycemia: Secondary | ICD-10-CM | POA: Diagnosis not present

## 2015-03-19 DIAGNOSIS — E785 Hyperlipidemia, unspecified: Secondary | ICD-10-CM | POA: Diagnosis not present

## 2015-03-19 DIAGNOSIS — E10649 Type 1 diabetes mellitus with hypoglycemia without coma: Secondary | ICD-10-CM | POA: Diagnosis not present

## 2015-03-19 DIAGNOSIS — R634 Abnormal weight loss: Secondary | ICD-10-CM | POA: Diagnosis not present

## 2015-03-19 DIAGNOSIS — E1065 Type 1 diabetes mellitus with hyperglycemia: Secondary | ICD-10-CM | POA: Diagnosis not present

## 2015-03-25 ENCOUNTER — Other Ambulatory Visit: Payer: Self-pay | Admitting: Family Medicine

## 2015-03-25 NOTE — Telephone Encounter (Signed)
Patient requesting refill. 

## 2015-03-29 ENCOUNTER — Ambulatory Visit (INDEPENDENT_AMBULATORY_CARE_PROVIDER_SITE_OTHER): Payer: Medicare Other | Admitting: Family Medicine

## 2015-03-29 ENCOUNTER — Encounter: Payer: Self-pay | Admitting: Family Medicine

## 2015-03-29 VITALS — BP 138/74 | HR 85 | Temp 98.5°F | Resp 16 | Ht 63.0 in | Wt 124.8 lb

## 2015-03-29 DIAGNOSIS — E1029 Type 1 diabetes mellitus with other diabetic kidney complication: Secondary | ICD-10-CM | POA: Diagnosis not present

## 2015-03-29 DIAGNOSIS — IMO0001 Reserved for inherently not codable concepts without codable children: Secondary | ICD-10-CM

## 2015-03-29 DIAGNOSIS — I10 Essential (primary) hypertension: Secondary | ICD-10-CM

## 2015-03-29 DIAGNOSIS — E785 Hyperlipidemia, unspecified: Secondary | ICD-10-CM | POA: Diagnosis not present

## 2015-03-29 DIAGNOSIS — Z79899 Other long term (current) drug therapy: Secondary | ICD-10-CM

## 2015-03-29 DIAGNOSIS — R809 Proteinuria, unspecified: Secondary | ICD-10-CM

## 2015-03-29 DIAGNOSIS — G3184 Mild cognitive impairment, so stated: Secondary | ICD-10-CM | POA: Diagnosis not present

## 2015-03-29 DIAGNOSIS — I700 Atherosclerosis of aorta: Secondary | ICD-10-CM

## 2015-03-29 DIAGNOSIS — Z1239 Encounter for other screening for malignant neoplasm of breast: Secondary | ICD-10-CM | POA: Diagnosis not present

## 2015-03-29 MED ORDER — ATORVASTATIN CALCIUM 80 MG PO TABS: 80.0000 mg | ORAL_TABLET | Freq: Every day | ORAL | Status: DC

## 2015-03-29 NOTE — Progress Notes (Signed)
Name: [name]   MRN: [id number]    DOB: 1940/07/05   Date:03/29/2015       Progress Note  Subjective  Chief Complaint  Chief Complaint  Patient presents with  . Follow-up    patient is here for a 59-monthf/u    HPI  HTN: taking Tribenzor, she has not noticed any swelling on her feet lately, She is compliant with medications. BP is much better now.   Hyperlipidemia: taking high dose Lipitor, compliant with medication, denies side effects. She has atherosclerosis of Aorta - and is aware that taking statin, aspirin and getting bp and DM under control is the way to manage that.   DMI: sees Dr. PEddie Dibbles last hgbA1C was still above 9.0/  She is on  Novolog Mix 70/30, taking statin, aspirin and ARB also, she has history of proteinuria. Continue ARB. Per husband no changes in regiment recently.   Mild Cognitive Impairment: she is still forgetful, but able to perform ADL without assistance, she is also able to do instrumental activity of daily living. She has been doing much better over the past year, more cheerful, smiling.   Left Wrist Pain: seen in 08 for it, resolved now.    Patient Active Problem List   Diagnosis Date Noted  . [place] 09/19[place]  . Benign hypertension 09/19/2014  . Calculus of gallbladder 09/19/2014  . Carpal tunnel syndrome 09/19/2014  . Detached retina 09/19/2014  . Type 1 diabetes mellitus with microalbuminuria ([place] 09/19/2014  . Dyslipidemia 09/19/2014  . Hypophosphatemia 09/19/2014  . Mild cognitive impairment with memory loss 09/19/2014  . Microalbuminuria 09/19/2014  . OP (osteoporosis) 09/19/2014  . Menopause 09/19/2014  . Ptosis of eyelid 09/19/2014  . Urge incontinence 08/01/2013  . Vitamin D deficiency 02/24/2007    Past Surgical History  Procedure Laterality Date  . Eye surgery    . Breast surgery  1970    Family History  Problem Relation Age of Onset  . Alzheimer's disease Mother   . Diabetes Mother   . Arthritis  Mother   . Diabetes Father   . Hypertension Father   . Hyperlipidemia Father   . Stroke Brother   . Multiple sclerosis Daughter     Social History   Social History  . Marital Status: Single    Spouse Name: N/A  . Number of Children: N/A  . Years of Education: N/A   Occupational History  . Not on file.   Social History Main Topics  . Smoking status: Never Smoker   . Smokeless tobacco: Never Used  . Alcohol Use: No  . Drug Use: No  . Sexual Activity: Not Currently   Other Topics Concern  . Not on file   Social History Narrative     Current outpatient prescriptions:  .  ACCU-CHEK AVIVA PLUS test strip, USE 1 STRIP UP TO 4 TIMES DAILY, Disp: 100 each, Rfl: 3 .  aspirin EC 81 MG tablet, Take 81 mg by mouth daily., Disp: , Rfl:  .  atorvastatin (LIPITOR) 80 MG tablet, Take 1 tablet (80 mg total) by mouth daily., Disp: 30 tablet, Rfl: 5 .  blood glucose meter kit and supplies, , Disp: , Rfl:  .  Calcium Carbonate-Vitamin D (CALCIUM-VITAMIN D) 600-125 MG-UNIT TABS, Take 1 tablet by mouth daily., Disp: , Rfl:  .  Cholecalciferol (VITAMIN D) 2000 UNITS tablet, Take 1 tablet (2,000 Units total) by mouth daily., Disp: 30 tablet, Rfl: 5 .  insulin aspart protamine- aspart (NOVOLOG MIX  70/30) (70-30) 100 UNIT/ML injection, Inject 32 units before breakfast and 6 units before lunch and 12u before supper as directed., Disp: , Rfl:  .  TRIBENZOR 40-5-25 MG TABS, TAKE 1 TABLET EVERY DAY FOR BLOOD PRESSURE, Disp: 30 tablet, Rfl: 2  Allergies  Allergen Reactions  . Colesevelam Hcl     scotomas  . Penicillins Itching     ROS  Constitutional: Negative for fever or weight change.  Respiratory: Negative for cough and shortness of breath.   Cardiovascular: Negative for chest pain or palpitations.  Gastrointestinal: Negative for abdominal pain, no bowel changes.  Musculoskeletal: Negative for gait problem or joint swelling.  Skin: Negative for rash.  Neurological: Negative for  dizziness or headache.  No other specific complaints in a complete review of systems (except as listed in HPI above).  Objective  Filed Vitals:   03/29/15 1052  BP: 138/74  Pulse: 85  Temp: 98.5 F (36.9 C)  TempSrc: Oral  Resp: 16  Height: 5' 3" (1.6 m)  Weight: 124 lb 12.8 oz (56.609 kg)  SpO2: 95%    Body mass index is 22.11 kg/(m^2).  Physical Exam  Constitutional: Patient appears well-developed and well-nourished.No distress.  HEENT: head atraumatic, normocephalic, pupils equal and reactive to light,  neck supple, throat within normal limits Cardiovascular: Normal rate, regular rhythm , Systolic Ejection murmur. No BLE edema. Pulmonary/Chest: Effort normal and breath sounds normal. No respiratory distress. Abdominal: Soft.  There is no tenderness. Psychiatric: Patient has a normal mood and affect. behavior is normal. Judgment and thought content normal. Muscular Skeletal: thenar atrophy on right side.   PHQ2/9: Depression screen [facility] 2/9 03/29/2015 09/26/2014  Decreased Interest 0 0  Down, Depressed, Hopeless 0 0  PHQ - 2 Score 0 0     Fall Risk: Fall Risk  03/29/2015 09/26/2014  Falls in the past year? No No     Functional Status Survey: Is the patient deaf or have difficulty hearing?: No Does the patient have difficulty seeing, even when wearing glasses/contacts?: No Does the patient have difficulty concentrating, remembering, or making decisions?: Yes (due to age) Does the patient have difficulty walking or climbing stairs?: No Does the patient have difficulty dressing or bathing?: No Does the patient have difficulty doing errands alone such as visiting a doctor's office or shopping?: No    Assessment & Plan  1. Benign hypertension  Continue Tribenzor, explained the need to enroll in prescription drug assistance once she reach the donut [place]  2. Aortic sclerosis (HCC)  Continue aspirin, statin and blood pressure medication   3. Type 1 diabetes  mellitus with microalbuminuria (HCC)  hgbA1C still not at goal, discussed importance of following a diabetic diet  4. Mild cognitive impairment with memory loss  stable  5. Dyslipidemia  - atorvastatin (LIPITOR) 80 MG tablet; Take 1 tablet (80 mg total) by mouth daily.  Dispense: 30 tablet; Refill: 5 - Lipid panel  6. Long-term use of high-risk medication  - AST - ALT

## 2015-03-29 NOTE — Patient Instructions (Signed)
AVOID EATING:   -bread -pasta -rice -white potatoes -sweets -bananas -grapes

## 2015-04-08 ENCOUNTER — Other Ambulatory Visit: Payer: Self-pay | Admitting: Family Medicine

## 2015-04-08 NOTE — Telephone Encounter (Signed)
Patient requesting refill. 

## 2015-04-10 DIAGNOSIS — Z79899 Other long term (current) drug therapy: Secondary | ICD-10-CM | POA: Diagnosis not present

## 2015-04-10 DIAGNOSIS — E785 Hyperlipidemia, unspecified: Secondary | ICD-10-CM | POA: Diagnosis not present

## 2015-04-11 LAB — LIPID PANEL
CHOL/HDL RATIO: 2.9 ratio (ref 0.0–4.4)
Cholesterol, Total: 192 mg/dL (ref 100–199)
HDL: 66 mg/dL (ref >39)
LDL CALC: 116 mg/dL — AB (ref 0–99)
Triglycerides: 48 mg/dL (ref 0–149)
VLDL CHOLESTEROL CAL: 10 mg/dL (ref 5–40)

## 2015-04-11 LAB — AST: AST: 19 IU/L (ref 0–40)

## 2015-04-11 LAB — ALT: ALT: 22 IU/L (ref 0–32)

## 2015-04-22 IMAGING — CR DG CHEST 1V PORT
1 series · 1 of 1 positions shown · non-contrast
Comparison: 01/23/2012

CLINICAL DATA: 72-year-old female with shortness of breath.

EXAM:
PORTABLE CHEST - 1 VIEW

[ap]
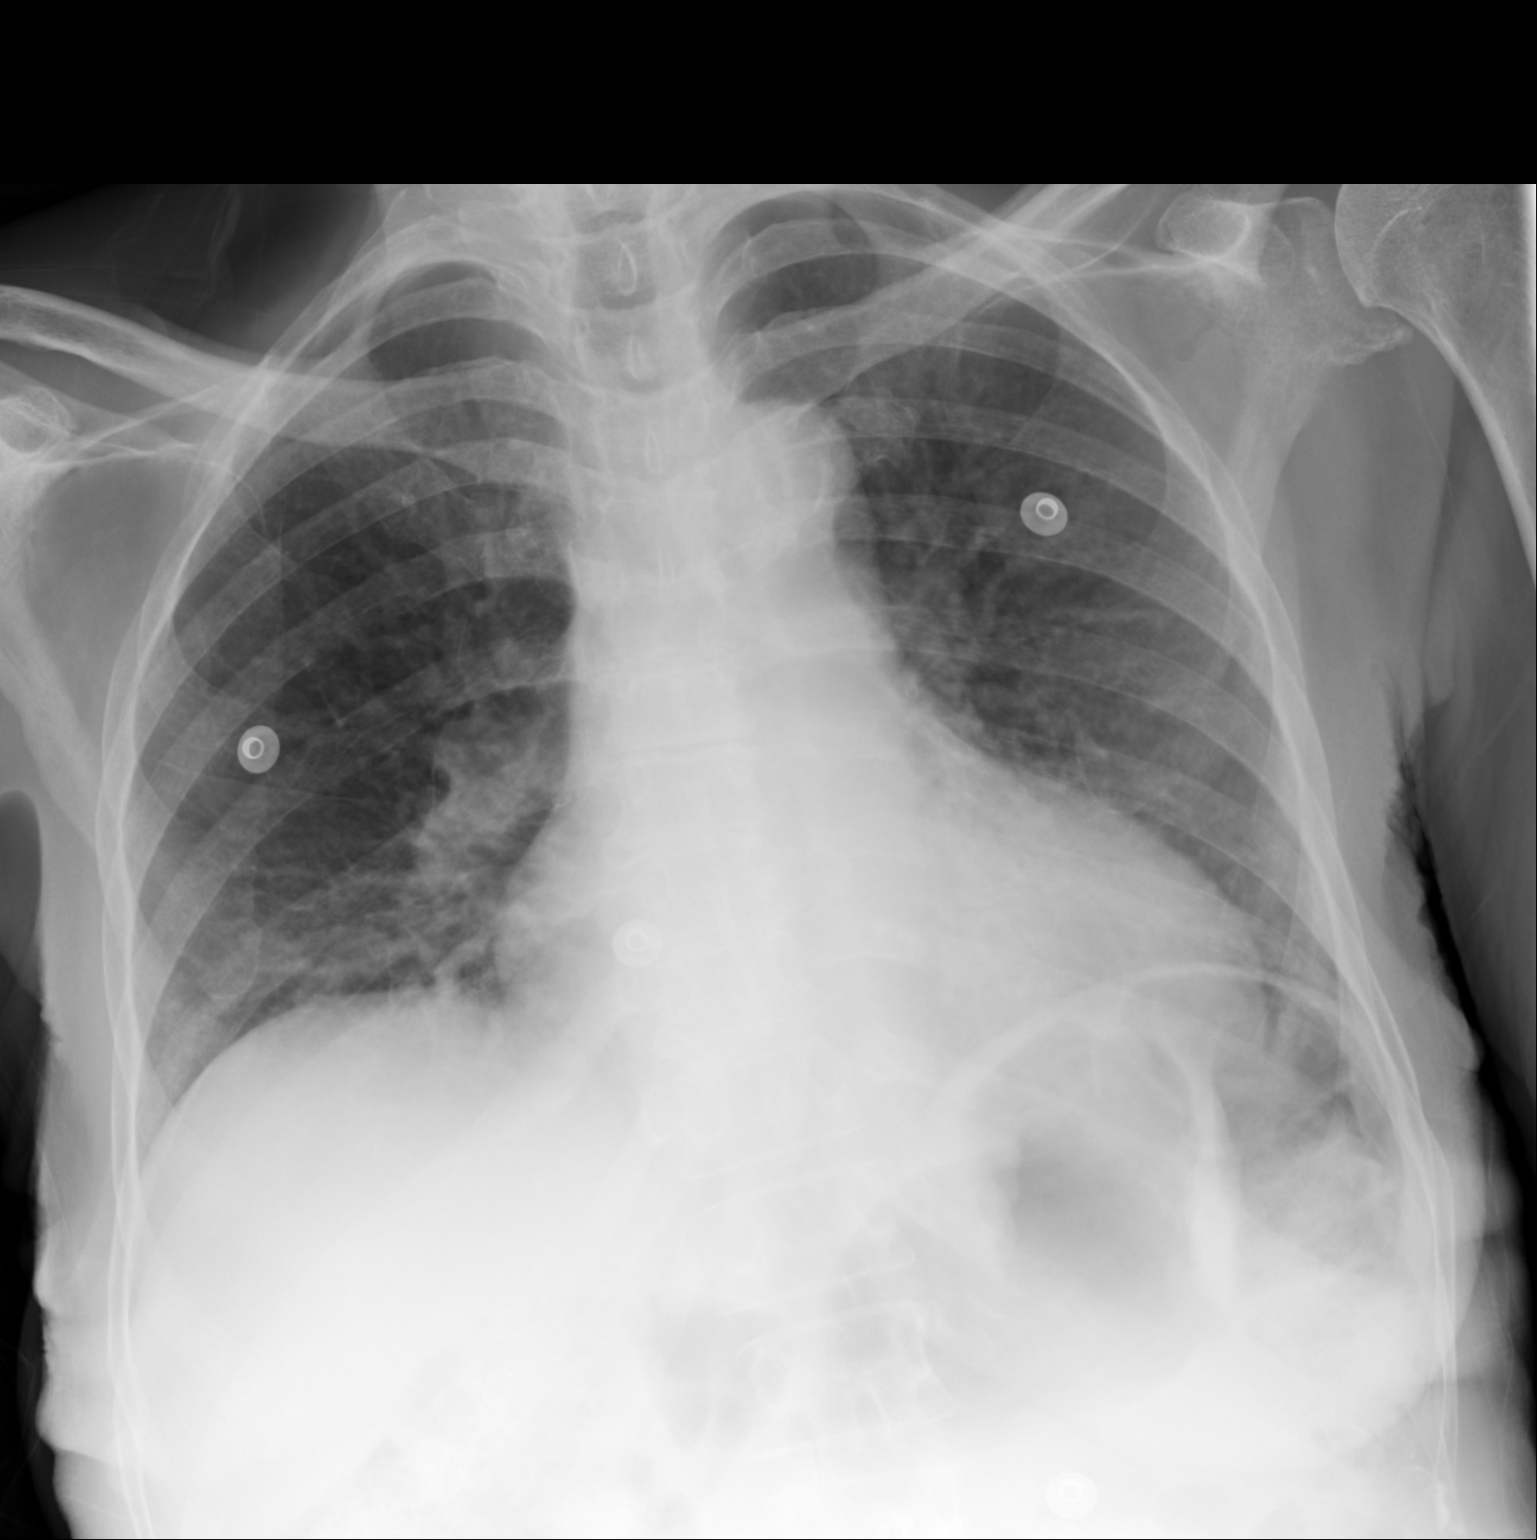

[1 of 1 positions shown; findings below may reference images not displayed]

FINDINGS: Mild cardiomegaly noted.

Possible left retrocardiac airspace disease versus atelectasis
noted.

This is a low volume film.

There is no evidence of pulmonary edema, suspicious pulmonary
nodule/mass, pleural effusion, or pneumothorax. No acute bony
abnormalities are identified.
IMPRESSION: Possible left retrocardiac airspace disease versus atelectasis.
Lateral view of the chest may be helpful for further evaluation.

Cardiomegaly

## 2015-07-18 DIAGNOSIS — R634 Abnormal weight loss: Secondary | ICD-10-CM | POA: Diagnosis not present

## 2015-07-18 DIAGNOSIS — E1065 Type 1 diabetes mellitus with hyperglycemia: Secondary | ICD-10-CM | POA: Diagnosis not present

## 2015-07-18 DIAGNOSIS — E785 Hyperlipidemia, unspecified: Secondary | ICD-10-CM | POA: Diagnosis not present

## 2015-07-18 DIAGNOSIS — I10 Essential (primary) hypertension: Secondary | ICD-10-CM | POA: Diagnosis not present

## 2015-07-18 DIAGNOSIS — E10649 Type 1 diabetes mellitus with hypoglycemia without coma: Secondary | ICD-10-CM | POA: Diagnosis not present

## 2015-08-28 DIAGNOSIS — E113553 Type 2 diabetes mellitus with stable proliferative diabetic retinopathy, bilateral: Secondary | ICD-10-CM | POA: Diagnosis not present

## 2015-09-30 ENCOUNTER — Ambulatory Visit (INDEPENDENT_AMBULATORY_CARE_PROVIDER_SITE_OTHER): Payer: Medicare Other | Admitting: Family Medicine

## 2015-09-30 ENCOUNTER — Encounter: Payer: Self-pay | Admitting: Family Medicine

## 2015-09-30 ENCOUNTER — Other Ambulatory Visit: Payer: Self-pay | Admitting: Family Medicine

## 2015-09-30 VITALS — BP 138/72 | HR 68 | Temp 98.0°F | Resp 18 | Ht 63.0 in | Wt 124.4 lb

## 2015-09-30 DIAGNOSIS — G3184 Mild cognitive impairment, so stated: Secondary | ICD-10-CM

## 2015-09-30 DIAGNOSIS — E1029 Type 1 diabetes mellitus with other diabetic kidney complication: Secondary | ICD-10-CM

## 2015-09-30 DIAGNOSIS — R809 Proteinuria, unspecified: Secondary | ICD-10-CM | POA: Diagnosis not present

## 2015-09-30 DIAGNOSIS — I10 Essential (primary) hypertension: Secondary | ICD-10-CM

## 2015-09-30 DIAGNOSIS — Z1231 Encounter for screening mammogram for malignant neoplasm of breast: Secondary | ICD-10-CM

## 2015-09-30 DIAGNOSIS — E785 Hyperlipidemia, unspecified: Secondary | ICD-10-CM

## 2015-09-30 MED ORDER — OLMESARTAN-AMLODIPINE-HCTZ 40-5-25 MG PO TABS: 1.0000 | ORAL_TABLET | Freq: Every day | ORAL | 5 refills | Status: DC

## 2015-09-30 MED ORDER — ATORVASTATIN CALCIUM 80 MG PO TABS: 80.0000 mg | ORAL_TABLET | Freq: Every day | ORAL | 5 refills | Status: DC

## 2015-09-30 NOTE — Progress Notes (Signed)
Name: [name]   MRN: [id number]    DOB: Mar 05, 1941   Date:09/30/2015       Progress Note  Subjective  Chief Complaint  Chief Complaint  Patient presents with  . Hypertension    pt here for 6 month follow up  . Diabetes    followed by endocrinologists    HPI   HTN: taking Tribenzor, she has very mild swelling on both of  her feet occasionally.She is compliant with medications.  Hyperlipidemia: taking high dose Lipitor, compliant with medication, denies side effects. She has atherosclerosis of Aorta - and is aware that taking statin, and getting bp and DM under control is the way to manage that. She also needs to resume taking Aspirin 81 mg daily   DMI: sees Dr. Eddie Dibbles, last hgbA1C May 2017 was 9.3%  She is on  Novolog Mix 70/30, taking statin, aspirin and ARB also, she has history of proteinuria. Continue ARB. She will get a 24 hour glucose monitor for the house soon.   Mild Cognitive Impairment: she is still forgetful, but able to perform ADL without assistance, she is also able to do instrumental activity of daily living. She has been doing much better over the 18 months. She has seen Dr. Manuella Ghazi ( neurologist in the past - and per husband she was  diagnosed with microvascular dementia )  Patient Active Problem List   Diagnosis Date Noted  . Aortic sclerosis ([place]) 09/19/2014  . Benign hypertension 09/19/2014  . Calculus of gallbladder 09/19/2014  . Carpal tunnel syndrome 09/19/2014  . Detached retina 09/19/2014  . Type 1 diabetes mellitus with microalbuminuria ([place]) 09/19/2014  . Dyslipidemia 09/19/2014  . Hypophosphatemia 09/19/2014  . Mild cognitive impairment with memory loss 09/19/2014  . Microalbuminuria 09/19/2014  . OP (osteoporosis) 09/19/2014  . Menopause 09/19/2014  . Ptosis of eyelid 09/19/2014  . Urge incontinence 08/01/2013  . Vitamin D deficiency 02/24/2007    Past Surgical History:  Procedure Laterality Date  . BREAST SURGERY  1970  . EYE SURGERY       Family History  Problem Relation Age of Onset  . Alzheimer's disease Mother   . Diabetes Mother   . Arthritis Mother   . Diabetes Father   . Hypertension Father   . Hyperlipidemia Father   . Stroke Brother   . Multiple sclerosis Daughter     Social History   Social History  . Marital status: Single    Spouse name: N/A  . Number of children: N/A  . Years of education: N/A   Occupational History  . Not on file.   Social History Main Topics  . Smoking status: Never Smoker  . Smokeless tobacco: Never Used  . Alcohol use No  . Drug use: No  . Sexual activity: Not Currently   Other Topics Concern  . Not on file   Social History Narrative  . No narrative on file     Current Outpatient Prescriptions:  .  Calcium Carbonate-Vitamin D (CALCIUM-VITAMIN D) 600-125 MG-UNIT TABS, Take 1 tablet by mouth daily., Disp: , Rfl:  .  Cholecalciferol (VITAMIN D) 2000 UNITS tablet, Take 1 tablet (2,000 Units total) by mouth daily., Disp: 30 tablet, Rfl: 5 .  [employer]S ALCOHOL SWABS PADS, USE 4 TIMES DAILY TO WIPE SKIN BEFORE USING INSULIN, Disp: 400 each, Rfl: 2 .  insulin aspart protamine- aspart (NOVOLOG MIX 70/30) (70-30) 100 UNIT/ML injection, Inject 32 units before breakfast and 6 units before lunch and 12u before supper as directed.,  Disp: , Rfl:  .  Olmesartan-Amlodipine-HCTZ 40-5-25 MG TABS, Take 1 tablet by mouth daily., Disp: 30 tablet, Rfl: 5 .  ACCU-CHEK AVIVA PLUS test strip, USE 1 STRIP UP TO 4 TIMES DAILY, Disp: 100 each, Rfl: 3 .  aspirin EC 81 MG tablet, Take 81 mg by mouth daily., Disp: , Rfl:  .  atorvastatin (LIPITOR) 80 MG tablet, Take 1 tablet (80 mg total) by mouth daily., Disp: 30 tablet, Rfl: 5 .  blood glucose meter kit and supplies, , Disp: , Rfl:   Allergies  Allergen Reactions  . Colesevelam Hcl     scotomas  . Penicillins Itching     ROS  Constitutional: Negative for fever or weight change.  Respiratory: Negative for cough and shortness of breath.    Cardiovascular: Negative for chest pain or palpitations.  Gastrointestinal: Negative for abdominal pain, no bowel changes.  Musculoskeletal: Negative for gait problem or joint swelling.  Skin: Negative for rash.  Neurological: Negative for dizziness or headache.  No other specific complaints in a complete review of systems (except as listed in HPI above).   Objective  Vitals:   09/30/15 1332  BP: 138/72  Pulse: 68  Resp: 18  Temp: 98 F (36.7 C)  SpO2: 97%  Weight: 124 lb 7 oz (56.4 kg)  Height: '5\' 3"'  (1.6 m)    Body mass index is 22.04 kg/m.  Physical Exam  Constitutional: Patient appears well-developed and well-nourished.  No distress.  HEENT: head atraumatic, normocephalic, pupils equal and reactive to light,neck supple, throat within normal limits Cardiovascular: Normal rate, regular rhythm and normal heart sounds.  No murmur heard. Trace  BLE edema. Pulmonary/Chest: Effort normal and breath sounds normal. No respiratory distress. Abdominal: Soft.  There is no tenderness. Psychiatric: Patient has a normal mood and affect. behavior is normal. Judgment and thought content normal.  Diabetic Foot Exam: Diabetic Foot Exam - Simple   Simple Foot Form Diabetic Foot exam was performed with the following findings:  Yes 09/30/2015  1:57 PM  Visual Inspection No deformities, no ulcerations, no other skin breakdown bilaterally:  Yes Sensation Testing Intact to touch and monofilament testing bilaterally:  Yes Pulse Check Posterior Tibialis and Dorsalis pulse intact bilaterally:  Yes Comments      PHQ2/9: Depression screen [facility] 2/9 03/29/2015 09/26/2014  Decreased Interest 0 0  Down, Depressed, Hopeless 0 0  PHQ - 2 Score 0 0     Fall Risk: Fall Risk  03/29/2015 09/26/2014  Falls in the past year? No No     Assessment & Plan  1. Benign hypertension  - Olmesartan-Amlodipine-HCTZ 40-5-25 MG TABS; Take 1 tablet by mouth daily.  Dispense: 30 tablet; Refill: 5  2.  Dyslipidemia  - atorvastatin (LIPITOR) 80 MG tablet; Take 1 tablet (80 mg total) by mouth daily.  Dispense: 30 tablet; Refill: 5  3. Mild cognitive impairment with memory loss  stable  4. Type 1 diabetes mellitus with microalbuminuria (HCC)  Continue ARB

## 2015-11-17 ENCOUNTER — Other Ambulatory Visit: Payer: Self-pay | Admitting: Family Medicine

## 2015-11-17 DIAGNOSIS — E559 Vitamin D deficiency, unspecified: Secondary | ICD-10-CM

## 2015-12-19 DIAGNOSIS — E1065 Type 1 diabetes mellitus with hyperglycemia: Secondary | ICD-10-CM | POA: Diagnosis not present

## 2015-12-19 DIAGNOSIS — E10649 Type 1 diabetes mellitus with hypoglycemia without coma: Secondary | ICD-10-CM | POA: Diagnosis not present

## 2015-12-19 DIAGNOSIS — E785 Hyperlipidemia, unspecified: Secondary | ICD-10-CM | POA: Diagnosis not present

## 2016-02-24 DIAGNOSIS — H26499 Other secondary cataract, unspecified eye: Secondary | ICD-10-CM | POA: Diagnosis not present

## 2016-04-01 ENCOUNTER — Ambulatory Visit (INDEPENDENT_AMBULATORY_CARE_PROVIDER_SITE_OTHER): Payer: Medicare Other | Admitting: Family Medicine

## 2016-04-01 ENCOUNTER — Encounter: Payer: Self-pay | Admitting: Family Medicine

## 2016-04-01 VITALS — BP 134/70 | HR 89 | Temp 97.9°F | Resp 16 | Ht 63.0 in | Wt 123.9 lb

## 2016-04-01 DIAGNOSIS — I700 Atherosclerosis of aorta: Secondary | ICD-10-CM | POA: Diagnosis not present

## 2016-04-01 DIAGNOSIS — R809 Proteinuria, unspecified: Secondary | ICD-10-CM

## 2016-04-01 DIAGNOSIS — I10 Essential (primary) hypertension: Secondary | ICD-10-CM

## 2016-04-01 DIAGNOSIS — E785 Hyperlipidemia, unspecified: Secondary | ICD-10-CM

## 2016-04-01 DIAGNOSIS — Z23 Encounter for immunization: Secondary | ICD-10-CM

## 2016-04-01 DIAGNOSIS — G3184 Mild cognitive impairment, so stated: Secondary | ICD-10-CM

## 2016-04-01 DIAGNOSIS — E1029 Type 1 diabetes mellitus with other diabetic kidney complication: Secondary | ICD-10-CM | POA: Diagnosis not present

## 2016-04-01 MED ORDER — ATORVASTATIN CALCIUM 80 MG PO TABS: 80.0000 mg | ORAL_TABLET | Freq: Every day | ORAL | 5 refills | Status: DC

## 2016-04-01 MED ORDER — OLMESARTAN-AMLODIPINE-HCTZ 40-5-25 MG PO TABS: 1.0000 | ORAL_TABLET | Freq: Every day | ORAL | 5 refills | Status: DC

## 2016-04-01 NOTE — Progress Notes (Signed)
Name: [name]   MRN: [id number]    DOB: September 27, 1940   Date:04/01/2016       Progress Note  Subjective  Chief Complaint  Chief Complaint  Patient presents with  . Medication Refill    6 month F/U  . Hypertension    Denies any symptoms and does not check her BP at home  . Hyperlipidemia  . Diabetes    Sees Dr. Eddie Dibbles for DM management    HPI  HTN: taking Tribenzor. She is compliant with medications. Denies chest pain or palpitation  Hyperlipidemia: taking high dose Lipitor, compliant with medication, denies side effects. She has atherosclerosis of Aorta - and is aware that taking statin, and getting bp and DM under control is the way to manage that. She is going to resume Aspirin 81 mg daily   DMI: sees Dr. Eddie Dibbles, last [facility] October  2017 was 9.5% She is on Novolog Mix 70/30, taking statin, aspirin and ARB also, she has history of proteinuria, but last urine micro was normal 06/2015 . Continue ARB. Fasting glucose is high, above 200, and is dropping after lunch and after dinner, advised to send a copy of her sugar log to Dr. Eddie Dibbles   Mild Cognitive Impairment: she is still forgetful, but able to perform ADL without assistance, she is also able to do instrumental activity of daily living. She has been stable She has seen Dr. Manuella Ghazi ( neurologist in the past - and per husband she was  diagnosed with microvascular dementia )  Patient Active Problem List   Diagnosis Date Noted  . Aortic sclerosis 09/19/2014  . Benign hypertension 09/19/2014  . Calculus of gallbladder 09/19/2014  . Carpal tunnel syndrome 09/19/2014  . Detached retina 09/19/2014  . Type 1 diabetes mellitus with microalbuminuria ([facility]) 09/19/2014  . Dyslipidemia 09/19/2014  . Hypophosphatemia 09/19/2014  . Mild cognitive impairment with memory loss 09/19/2014  . Microalbuminuria 09/19/2014  . OP (osteoporosis) 09/19/2014  . Menopause 09/19/2014  . Ptosis of eyelid 09/19/2014  . Urge incontinence 08/01/2013  .  Vitamin D deficiency 02/24/2007    Past Surgical History:  Procedure Laterality Date  . BREAST SURGERY  1970  . EYE SURGERY      Family History  Problem Relation Age of Onset  . Alzheimer's disease Mother   . Diabetes Mother   . Arthritis Mother   . Diabetes Father   . Hypertension Father   . Hyperlipidemia Father   . Stroke Brother   . Multiple sclerosis Daughter     Social History   Social History  . Marital status: Single    Spouse name: N/A  . Number of children: N/A  . Years of education: N/A   Occupational History  . Not on file.   Social History Main Topics  . Smoking status: Never Smoker  . Smokeless tobacco: Never Used  . Alcohol use No  . Drug use: No  . Sexual activity: Not Currently   Other Topics Concern  . Not on file   Social History Narrative  . No narrative on file     Current Outpatient Prescriptions:  .  ACCU-CHEK AVIVA PLUS test strip, USE 1 STRIP UP TO 4 TIMES DAILY, Disp: 100 each, Rfl: 3 .  aspirin EC 81 MG tablet, Take 81 mg by mouth daily., Disp: , Rfl:  .  atorvastatin (LIPITOR) 80 MG tablet, Take 1 tablet (80 mg total) by mouth daily., Disp: 30 tablet, Rfl: 5 .  blood glucose meter kit and supplies, ,  Disp: , Rfl:  .  Calcium Carbonate-Vitamin D (CALCIUM-VITAMIN D) 600-125 MG-UNIT TABS, Take 1 tablet by mouth daily., Disp: , Rfl:  .  Calcium-Magnesium-Vitamin D (CALCIUM 500 PO), Take by mouth., Disp: , Rfl:  .  Cholecalciferol (VITAMIN D3) 2000 units capsule, TAKE 1 TABLET (2,000 UNITS TOTAL) BY MOUTH DAILY., Disp: 100 capsule, Rfl: 2 .  [employer] ALCOHOL SWABS PADS, USE 4 TIMES DAILY TO WIPE SKIN BEFORE USING INSULIN, Disp: 400 each, Rfl: 2 .  insulin aspart protamine- aspart (NOVOLOG MIX 70/30) (70-30) 100 UNIT/ML injection, Inject 32 units before breakfast and 6 units before lunch and 12u before supper as directed., Disp: , Rfl:  .  Olmesartan-Amlodipine-HCTZ 40-5-25 MG TABS, Take 1 tablet by mouth daily., Disp: 30 tablet, Rfl:  5  Allergies  Allergen Reactions  . Colesevelam Hcl     scotomas  . Penicillins Itching     ROS  Constitutional: Negative for fever or weight change.  Respiratory: Negative for cough and shortness of breath.   Cardiovascular: Negative for chest pain or palpitations.  Gastrointestinal: Negative for abdominal pain, no bowel changes.  Musculoskeletal: Negative for gait problem or joint swelling.  Skin: Negative for rash.  Neurological: Negative for dizziness or headache.  No other specific complaints in a complete review of systems (except as listed in HPI above).  Objective  Vitals:   04/01/16 1319  BP: 134/70  Pulse: 89  Resp: 16  Temp: 97.9 F (36.6 C)  TempSrc: Oral  SpO2: 94%  Weight: 123 lb 14.4 oz (56.2 kg)  Height: _0  (1.6 m)    Body mass index is 21.95 kg/m.  Physical Exam  Constitutional: Patient appears well-developed and well-nourished. No distress.  HEENT: head atraumatic, normocephalic, pupils equal and reactive to light, neck supple, throat within normal limits Cardiovascular: Normal rate, regular rhythm and normal heart sounds.  No murmur heard. No BLE edema. Pulmonary/Chest: Effort normal and breath sounds normal. No respiratory distress. Abdominal: Soft.  There is no tenderness. Psychiatric: Patient has a normal mood and affect. behavior is normal. Judgment and thought content normal.   PHQ2/9: Depression screen [facility] 2/9 04/01/2016 03/29/2015 09/26/2014  Decreased Interest 0 0 0  Down, Depressed, Hopeless 0 0 0  PHQ - 2 Score 0 0 0     Fall Risk: Fall Risk  04/01/2016 03/29/2015 09/26/2014  Falls in the past year? No No No     Functional Status Survey: Is the patient deaf or have difficulty hearing?: No Does the patient have difficulty seeing, even when wearing glasses/contacts?: No Does the patient have difficulty concentrating, remembering, or making decisions?: Yes Does the patient have difficulty walking or climbing stairs?: No Does  the patient have difficulty dressing or bathing?: No Does the patient have difficulty doing errands alone such as visiting a doctor's office or shopping?: Yes    Assessment & Plan  1. Benign hypertension  Well controlled - Olmesartan-Amlodipine-HCTZ 40-5-25 MG TABS; Take 1 tablet by mouth daily.  Dispense: 30 tablet; Refill: 5  2. Dyslipidemia  - atorvastatin (LIPITOR) 80 MG tablet; Take 1 tablet (80 mg total) by mouth daily.  Dispense: 30 tablet; Refill: 5  3. Mild cognitive impairment with memory loss  Advised to return to Neurologist - Dr. Manuella Ghazi   4. Type 1 diabetes mellitus with microalbuminuria (HCC)  Continue follow up with Dr. Eddie Dibbles   5. Needs flu shot  - Flu vaccine HIGH DOSE PF  6. Atherosclerosis of aorta [facility])  Continue medical management

## 2016-05-20 ENCOUNTER — Ambulatory Visit
Admission: RE | Admit: 2016-05-20 | Discharge: 2016-05-20 | Disposition: A | Discharge status: Home / Self Care | Payer: Medicare Other | Source: Ambulatory Visit | Attending: Family Medicine | Admitting: Family Medicine

## 2016-05-20 DIAGNOSIS — Z1231 Encounter for screening mammogram for malignant neoplasm of breast: Secondary | ICD-10-CM | POA: Diagnosis not present

## 2016-05-20 DIAGNOSIS — Z1239 Encounter for other screening for malignant neoplasm of breast: Secondary | ICD-10-CM

## 2016-06-08 DIAGNOSIS — H0014 Chalazion left upper eyelid: Secondary | ICD-10-CM | POA: Diagnosis not present

## 2016-06-18 DIAGNOSIS — E1065 Type 1 diabetes mellitus with hyperglycemia: Secondary | ICD-10-CM | POA: Diagnosis not present

## 2016-06-18 DIAGNOSIS — E10649 Type 1 diabetes mellitus with hypoglycemia without coma: Secondary | ICD-10-CM | POA: Diagnosis not present

## 2016-06-18 DIAGNOSIS — E1069 Type 1 diabetes mellitus with other specified complication: Secondary | ICD-10-CM | POA: Diagnosis not present

## 2016-06-18 DIAGNOSIS — E785 Hyperlipidemia, unspecified: Secondary | ICD-10-CM | POA: Diagnosis not present

## 2016-06-18 DIAGNOSIS — R634 Abnormal weight loss: Secondary | ICD-10-CM | POA: Diagnosis not present

## 2016-08-31 DIAGNOSIS — H26499 Other secondary cataract, unspecified eye: Secondary | ICD-10-CM | POA: Diagnosis not present

## 2016-08-31 DIAGNOSIS — H35373 Puckering of macula, bilateral: Secondary | ICD-10-CM | POA: Diagnosis not present

## 2016-08-31 LAB — HM DIABETES EYE EXAM

## 2016-09-28 ENCOUNTER — Ambulatory Visit (INDEPENDENT_AMBULATORY_CARE_PROVIDER_SITE_OTHER): Payer: Medicare Other

## 2016-09-28 VITALS — BP 154/62 | HR 60 | Temp 97.1°F | Ht 63.0 in | Wt 126.1 lb

## 2016-09-28 DIAGNOSIS — Z0000 Encounter for general adult medical examination without abnormal findings: Secondary | ICD-10-CM | POA: Diagnosis not present

## 2016-09-28 NOTE — Patient Instructions (Signed)
[name] , Thank you for taking time to come for your Medicare Wellness Visit. I appreciate your ongoing commitment to your health goals. Please review the following plan we discussed and let me know if I can assist you in the future.   Screening recommendations/referrals: Colonoscopy: completed 08/11/12 Mammogram: completed 04/01/16, due 03/2017 Bone Density: completed 04/23/10 Recommended yearly ophthalmology/optometry visit for glaucoma screening and checkup Recommended yearly dental visit for hygiene and checkup  Vaccinations: Influenza vaccine: due 11/2016 Pneumococcal vaccine: completed series Tdap vaccine: completed 10/29/11 Shingles vaccine: declined    Advanced directives: Please bring a copy of your POA (Power of [place]) and/or Living Will to your next appointment.   Conditions/risks identified: Recommend to exercise 3x per week (30 min per time)  Next appointment: 09/29/16 @ 2:00 PM   Preventive Care 65 Years and Older, Female Preventive care refers to lifestyle choices and visits with your health care provider that can promote health and wellness. What does preventive care include?  A yearly physical exam. This is also called an annual well check.  Dental exams once or twice a year.  Routine eye exams. Ask your health care provider how often you should have your eyes checked.  Personal lifestyle choices, including:  Daily care of your teeth and gums.  Regular physical activity.  Eating a healthy diet.  Avoiding tobacco and drug use.  Limiting alcohol use.  Practicing safe sex.  Taking low-dose aspirin every day.  Taking vitamin and mineral supplements as recommended by your health care provider. What happens during an annual well check? The services and screenings done by your health care provider during your annual well check will depend on your age, overall health, lifestyle risk factors, and family history of disease. Counseling  Your health care  provider may ask you questions about your:  Alcohol use.  Tobacco use.  Drug use.  Emotional well-being.  Home and relationship well-being.  Sexual activity.  Eating habits.  History of falls.  Memory and ability to understand (cognition).  Work and work Statistician.  Reproductive health. Screening  You may have the following tests or measurements:  Height, weight, and BMI.  Blood pressure.  Lipid and cholesterol levels. These may be checked every 5 years, or more frequently if you are over 39 years old.  Skin check.  Lung cancer screening. You may have this screening every year starting at age 11 if you have a 30-pack-year history of smoking and currently smoke or have quit within the past 15 years.  Fecal occult blood test (FOBT) of the stool. You may have this test every year starting at age 65.  Flexible sigmoidoscopy or colonoscopy. You may have a sigmoidoscopy every 5 years or a colonoscopy every 10 years starting at age 46.  Hepatitis C blood test.  Hepatitis B blood test.  Sexually transmitted disease (STD) testing.  Diabetes screening. This is done by checking your blood sugar (glucose) after you have not eaten for a while (fasting). You may have this done every 1-3 years.  Bone density scan. This is done to screen for osteoporosis. You may have this done starting at age 34.  Mammogram. This may be done every 1-2 years. Talk to your health care provider about how often you should have regular mammograms. Talk with your health care provider about your test results, treatment options, and if necessary, the need for more tests. Vaccines  Your health care provider may recommend certain vaccines, such as:  Influenza vaccine. This is recommended  every year.  Tetanus, diphtheria, and acellular pertussis (Tdap, Td) vaccine. You may need a Td booster every 10 years.  Zoster vaccine. You may need this after age 19.  Pneumococcal 13-valent conjugate (PCV13)  vaccine. One dose is recommended after age 57.  Pneumococcal polysaccharide (PPSV23) vaccine. One dose is recommended after age 23. Talk to your health care provider about which screenings and vaccines you need and how often you need them. This information is not intended to replace advice given to you by your health care provider. Make sure you discuss any questions you have with your health care provider. Document Released: 03/22/2015 Document Revised: 11/13/2015 Document Reviewed: 12/25/2014 Elsevier Interactive Patient Education  2017 [place] Prevention in the Home Falls can cause injuries. They can happen to people of all ages. There are many things you can do to make your home safe and to help prevent falls. What can I do on the outside of my home?  Regularly fix the edges of walkways and driveways and fix any cracks.  Remove anything that might make you trip as you walk through a door, such as a raised step or threshold.  Trim any bushes or trees on the path to your home.  Use bright outdoor lighting.  Clear any walking paths of anything that might make someone trip, such as rocks or tools.  Regularly check to see if handrails are loose or broken. Make sure that both sides of any steps have handrails.  Any raised decks and porches should have guardrails on the edges.  Have any leaves, snow, or ice cleared regularly.  Use sand or salt on walking paths during winter.  Clean up any spills in your garage right away. This includes oil or grease spills. What can I do in the bathroom?  Use night lights.  Install grab bars by the toilet and in the tub and shower. Do not use towel bars as grab bars.  Use non-skid mats or decals in the tub or shower.  If you need to sit down in the shower, use a plastic, non-slip stool.  Keep the floor dry. Clean up any water that spills on the floor as soon as it happens.  Remove soap buildup in the tub or shower  regularly.  Attach bath mats securely with double-sided non-slip rug tape.  Do not have throw rugs and other things on the floor that can make you trip. What can I do in the bedroom?  Use night lights.  Make sure that you have a light by your bed that is easy to reach.  Do not use any sheets or blankets that are too big for your bed. They should not hang down onto the floor.  Have a firm chair that has side arms. You can use this for support while you get dressed.  Do not have throw rugs and other things on the floor that can make you trip. What can I do in the kitchen?  Clean up any spills right away.  Avoid walking on wet floors.  Keep items that you use a lot in easy-to-reach places.  If you need to reach something above you, use a strong step stool that has a grab bar.  Keep electrical cords out of the way.  Do not use floor polish or wax that makes floors slippery. If you must use wax, use non-skid floor wax.  Do not have throw rugs and other things on the floor that can make you trip. What  can I do with my stairs?  Do not leave any items on the stairs.  Make sure that there are handrails on both sides of the stairs and use them. Fix handrails that are broken or loose. Make sure that handrails are as long as the stairways.  Check any carpeting to make sure that it is firmly attached to the stairs. Fix any carpet that is loose or worn.  Avoid having throw rugs at the top or bottom of the stairs. If you do have throw rugs, attach them to the floor with carpet tape.  Make sure that you have a light switch at the top of the stairs and the bottom of the stairs. If you do not have them, ask someone to add them for you. What else can I do to help prevent falls?  Wear shoes that:  Do not have high heels.  Have rubber bottoms.  Are comfortable and fit you well.  Are closed at the toe. Do not wear sandals.  If you use a stepladder:  Make sure that it is fully  opened. Do not climb a closed stepladder.  Make sure that both sides of the stepladder are locked into place.  Ask someone to hold it for you, if possible.  Clearly mark and make sure that you can see:  Any grab bars or handrails.  First and last steps.  Where the edge of each step is.  Use tools that help you move around (mobility aids) if they are needed. These include:  Canes.  Walkers.  Scooters.  Crutches.  Turn on the lights when you go into a dark area. Replace any light bulbs as soon as they burn out.  Set up your furniture so you have a clear path. Avoid moving your furniture around.  If any of your floors are uneven, fix them.  If there are any pets around you, be aware of where they are.  Review your medicines with your doctor. Some medicines can make you feel dizzy. This can increase your chance of falling. Ask your doctor what other things that you can do to help prevent falls. This information is not intended to replace advice given to you by your health care provider. Make sure you discuss any questions you have with your health care provider. Document Released: 12/20/2008 Document Revised: 08/01/2015 Document Reviewed: 03/30/2014 Elsevier Interactive Patient Education  2017 [employer].

## 2016-09-28 NOTE — Progress Notes (Signed)
Subjective:   [name] is a 76 y.o. female who presents for Medicare Annual (Subsequent) preventive examination.  Review of Systems:  N/A  Cardiac Risk Factors include: advanced age (>31mn, >>58women);diabetes mellitus;dyslipidemia;hypertension     Objective:     Vitals: BP (!) 154/62 (BP Location: Left Arm)   Pulse 60   Temp (!) 97.1 F (36.2 C) (Oral)   Ht '5\' 3"'  (1.6 m)   Wt 126 lb 1.6 oz (57.2 kg)   BMI 22.34 kg/m   Body mass index is 22.34 kg/m.   Tobacco History  Smoking Status  . Never Smoker  Smokeless Tobacco  . Never Used     Counseling given: Not Answered   Past Medical History:  Diagnosis Date  . Dementia   . Diabetes mellitus without complication ([place]   . Dysrhythmia   . Heart murmur   . Hypertension    Past Surgical History:  Procedure Laterality Date  . BREAST EXCISIONAL BIOPSY Left    neg  . BREAST SURGERY  1970  . EYE SURGERY     Family History  Problem Relation Age of Onset  . Alzheimer's disease Mother   . Diabetes Mother   . Arthritis Mother   . Diabetes Father   . Hypertension Father   . Hyperlipidemia Father   . Stroke Brother   . Multiple sclerosis Daughter    History  Sexual Activity  . Sexual activity: Not Currently    Outpatient Encounter Prescriptions as of 09/28/2016  Medication Sig  . ACCU-CHEK AVIVA PLUS test strip USE 1 STRIP UP TO 4 TIMES DAILY  . aspirin EC 81 MG tablet Take 81 mg by mouth daily.  .Marland Kitchenatorvastatin (LIPITOR) 80 MG tablet Take 1 tablet (80 mg total) by mouth daily.  . blood glucose meter kit and supplies   . Calcium Carbonate-Vitamin D (CALCIUM-VITAMIN D) 600-125 MG-UNIT TABS Take 1 tablet by mouth daily.  . Cholecalciferol (VITAMIN D3) 2000 units capsule TAKE 1 TABLET (2,000 UNITS TOTAL) BY MOUTH DAILY.  .Marland KitchenCholecalciferol (VITAMIN D3) 2000 units capsule Take 2,000 Units by mouth daily.  . CVS ALCOHOL SWABS PADS USE 4 TIMES DAILY TO WIPE SKIN BEFORE USING INSULIN  . insulin aspart  protamine- aspart (NOVOLOG MIX 70/30) (70-30) 100 UNIT/ML injection Inject 32 units before breakfast and 6 units before lunch and 12u before supper as directed.  . Olmesartan-Amlodipine-HCTZ 40-5-25 MG TABS Take 1 tablet by mouth daily.  . [DISCONTINUED] Calcium-Magnesium-Vitamin D (CALCIUM 500 PO) Take by mouth.   No facility-administered encounter medications on file as of 09/28/2016.     Activities of Daily Living In your present state of health, do you have any difficulty performing the following activities: 09/28/2016 04/01/2016  Hearing? N N  Vision? Y N  Difficulty concentrating or making decisions? YTempie Donning Walking or climbing stairs? N N  Dressing or bathing? N N  Doing errands, shopping? YTempie Donning Preparing Food and eating ? N -  Using the Toilet? N -  In the past six months, have you accidently leaked urine? Y -  Do you have problems with loss of bowel control? N -  Managing your Medications? Y -  Managing your Finances? Y -  Housekeeping or managing your Housekeeping? N -  Some recent data might be hidden    Patient Care Team: SSteele Sizer MD as PCP - General (Family Medicine) PElease Etienne MD as Consulting Physician (Internal Medicine) KEulogio Bear MD as Consulting Physician (Ophthalmology)  Assessment:     Exercise Activities and Dietary recommendations Current Exercise Habits: The patient does not participate in regular exercise at present, Exercise limited by: None identified  Goals    . Exercise 3x per week (30 min per time)          Recommend to exercise 3x per week (30 min per time)      Fall Risk Fall Risk  09/28/2016 04/01/2016 03/29/2015 09/26/2014  Falls in the past year? No No No No   Depression Screen PHQ 2/9 Scores 09/28/2016 04/01/2016 03/29/2015 09/26/2014  PHQ - 2 Score 0 0 0 0     Cognitive Function     6CIT Screen 09/28/2016  What Year? 0 points  What month? 0 points  What time? 3 points  Count back from 20 0 points  Months in  reverse 0 points  Repeat phrase 10 points  Total Score 13    Immunization History  Administered Date(s) Administered  . Influenza, High Dose Seasonal PF 04/01/2016  . Influenza,inj,Quad PF,36+ Mos 01/01/2015  . Influenza-Unspecified 11/27/2013  . Pneumococcal Conjugate-13 03/29/2014  . Pneumococcal Polysaccharide-23 10/03/2009  . Tdap 10/29/2011   Screening Tests Health Maintenance  Topic Date Due  . HEMOGLOBIN A1C  06/18/2016  . FOOT EXAM  09/29/2016  . INFLUENZA VACCINE  10/07/2016  . OPHTHALMOLOGY EXAM  08/07/2017  . MAMMOGRAM  05/21/2018  . TETANUS/TDAP  10/28/2021  . COLONOSCOPY  08/12/2022  . DEXA SCAN  Completed  . PNA vac Low Risk Adult  Completed      Plan:  I have personally reviewed and addressed the Medicare Annual Wellness questionnaire and have noted the following in the patient's chart:  A. Medical and social history B. Use of alcohol, tobacco or illicit drugs  C. Current medications and supplements D. Functional ability and status E.  Nutritional status F.  Physical activity G. Advance directives H. List of other physicians I.  Hospitalizations, surgeries, and ER visits in previous 12 months J.  [place] such as hearing and vision if needed, cognitive and depression L. Referrals and appointments - none  In addition, I have reviewed and discussed with patient certain preventive protocols, quality metrics, and best practice recommendations. A written personalized care plan for preventive services as well as general preventive health recommendations were provided to patient.  See attached scanned questionnaire for additional information.   Signed,  Fabio Neighbors, LPN Nurse Health Advisor   MD Recommendations: Pt needs to have her Hgb A1c checked at next OV.  I have reviewed this encounter including the documentation in this note and/or discussed this patient with the provider, Fabio Neighbors, LPN. I am certifying that I agree with  the content of this note as supervising physician.  Steele Sizer, MD [facility] 09/29/2016, 2:10 PM

## 2016-09-29 ENCOUNTER — Encounter: Payer: Self-pay | Admitting: Family Medicine

## 2016-09-29 ENCOUNTER — Ambulatory Visit (INDEPENDENT_AMBULATORY_CARE_PROVIDER_SITE_OTHER): Payer: Medicare Other | Admitting: Family Medicine

## 2016-09-29 VITALS — BP 124/64 | HR 84 | Temp 98.0°F | Resp 16 | Ht 63.0 in | Wt 124.5 lb

## 2016-09-29 DIAGNOSIS — E1029 Type 1 diabetes mellitus with other diabetic kidney complication: Secondary | ICD-10-CM | POA: Diagnosis not present

## 2016-09-29 DIAGNOSIS — E785 Hyperlipidemia, unspecified: Secondary | ICD-10-CM

## 2016-09-29 DIAGNOSIS — R809 Proteinuria, unspecified: Secondary | ICD-10-CM | POA: Diagnosis not present

## 2016-09-29 DIAGNOSIS — E559 Vitamin D deficiency, unspecified: Secondary | ICD-10-CM | POA: Diagnosis not present

## 2016-09-29 DIAGNOSIS — M810 Age-related osteoporosis without current pathological fracture: Secondary | ICD-10-CM | POA: Diagnosis not present

## 2016-09-29 DIAGNOSIS — I10 Essential (primary) hypertension: Secondary | ICD-10-CM | POA: Diagnosis not present

## 2016-09-29 DIAGNOSIS — I700 Atherosclerosis of aorta: Secondary | ICD-10-CM | POA: Diagnosis not present

## 2016-09-29 DIAGNOSIS — G3184 Mild cognitive impairment, so stated: Secondary | ICD-10-CM | POA: Diagnosis not present

## 2016-09-29 LAB — POCT GLYCOSYLATED HEMOGLOBIN (HGB A1C): HEMOGLOBIN A1C: 8.3

## 2016-09-29 MED ORDER — ATORVASTATIN CALCIUM 80 MG PO TABS: 80.0000 mg | ORAL_TABLET | Freq: Every day | ORAL | 5 refills | Status: DC

## 2016-09-29 MED ORDER — OLMESARTAN-AMLODIPINE-HCTZ 40-5-25 MG PO TABS: 1.0000 | ORAL_TABLET | Freq: Every day | ORAL | 5 refills | Status: DC

## 2016-09-29 NOTE — Progress Notes (Signed)
Name: [name]   MRN: [id number]    DOB: 04-13-1940   Date:09/29/2016       Progress Note  Subjective  Chief Complaint  Chief Complaint  Patient presents with  . Hypertension    6 month follow up  . Diabetes    HPI  HTN: taking Tribenzor. She is compliant with medications, she was seen yesterday and bp was elevated, husband states not sure if she took it yesterday and may have been the cause of high bp.  Denies chest pain or palpitation, no edema  Hyperlipidemia: taking high dose Lipitor, compliant with medication, denies side effects. She has atherosclerosis of Aorta - and is aware that taking statin, and getting bp and DM under control is the way to manage that. She is taking  Aspirin 81 mg daily   DMI: sees Dr. Eddie Dibbles, last [facility] March 2017 and it was 9.6% down to 8.3%  She is on Novolog Mix 70/30 32 units, 6 units before lunch and 12 units at night, taking statin, aspirin and ARB also, she has history of proteinuria, but last urine micro was normal 06/2015, we will recheck it today . Continue ARB. Fasting glucose is high in am's and low at night, as low as 34 and advised to contact Dr. Eddie Dibbles and see if insulin can be adjusted  Since on average glucose is low before bed and high in am's with some severe hypoglycemic episodes  Mild Cognitive Impairment: she is still forgetful, but able to perform ADL without assistance, she is also able to do some instrumental activity of daily living, needs helps with finances and taking medication. She has been stable She has seen Dr. Manuella Ghazi ( neurologist in the past - and per husband she was diagnosed with microvascular dementia )  Osteoporosis: she does not recall ever taking medication for it, only taking calcium plus D, no fractures in the past.    Patient Active Problem List   Diagnosis Date Noted  . Atherosclerosis of aorta ([place]) 04/01/2016  . Aortic sclerosis 09/19/2014  . Benign hypertension 09/19/2014  . Calculus of gallbladder  09/19/2014  . Carpal tunnel syndrome 09/19/2014  . Detached retina 09/19/2014  . Type 1 diabetes mellitus with microalbuminuria ([place]) 09/19/2014  . Dyslipidemia 09/19/2014  . Hypophosphatemia 09/19/2014  . Mild cognitive impairment with memory loss 09/19/2014  . Microalbuminuria 09/19/2014  . OP (osteoporosis) 09/19/2014  . Menopause 09/19/2014  . Ptosis of eyelid 09/19/2014  . Urge incontinence 08/01/2013  . Vitamin D deficiency 02/24/2007    Past Surgical History:  Procedure Laterality Date  . BREAST EXCISIONAL BIOPSY Left    neg  . BREAST SURGERY  1970  . EYE SURGERY      Family History  Problem Relation Age of Onset  . Alzheimer's disease Mother   . Diabetes Mother   . Arthritis Mother   . Diabetes Father   . Hypertension Father   . Hyperlipidemia Father   . Stroke Brother   . Multiple sclerosis Daughter     Social History   Social History  . Marital status: Single    Spouse name: N/A  . Number of children: N/A  . Years of education: N/A   Occupational History  . Not on file.   Social History Main Topics  . Smoking status: Never Smoker  . Smokeless tobacco: Never Used  . Alcohol use No  . Drug use: No  . Sexual activity: Not Currently   Other Topics Concern  . Not on file  Social History Narrative  . No narrative on file     Current Outpatient Prescriptions:  .  ACCU-CHEK AVIVA PLUS test strip, USE 1 STRIP UP TO 4 TIMES DAILY, Disp: 100 each, Rfl: 3 .  aspirin EC 81 MG tablet, Take 81 mg by mouth daily., Disp: , Rfl:  .  atorvastatin (LIPITOR) 80 MG tablet, Take 1 tablet (80 mg total) by mouth daily., Disp: 30 tablet, Rfl: 5 .  blood glucose meter kit and supplies, , Disp: , Rfl:  .  Calcium Carbonate-Vitamin D (CALCIUM-VITAMIN D) 600-125 MG-UNIT TABS, Take 1 tablet by mouth daily., Disp: , Rfl:  .  Cholecalciferol (VITAMIN D3) 2000 units capsule, TAKE 1 TABLET (2,000 UNITS TOTAL) BY MOUTH DAILY., Disp: 100 capsule, Rfl: 2 .  Cholecalciferol  (VITAMIN D3) 2000 units capsule, Take 2,000 Units by mouth daily., Disp: , Rfl:  .  [employer] ALCOHOL SWABS PADS, USE 4 TIMES DAILY TO WIPE SKIN BEFORE USING INSULIN, Disp: 400 each, Rfl: 2 .  insulin aspart protamine- aspart (NOVOLOG MIX 70/30) (70-30) 100 UNIT/ML injection, Inject 32 units before breakfast and 6 units before lunch and 12u before supper as directed., Disp: , Rfl:  .  Olmesartan-Amlodipine-HCTZ 40-5-25 MG TABS, Take 1 tablet by mouth daily., Disp: 30 tablet, Rfl: 5  Allergies  Allergen Reactions  . Colesevelam Hcl     scotomas  . Daucus Carota   . Penicillins Itching     ROS  Constitutional: Negative for fever or weight change.  Respiratory: Negative for cough and shortness of breath.   Cardiovascular: Negative for chest pain or palpitations.  Gastrointestinal: Negative for abdominal pain, no bowel changes.  Musculoskeletal: Negative for gait problem or joint swelling.  Skin: Negative for rash.  Neurological: Negative for dizziness or headache.  No other specific complaints in a complete review of systems (except as listed in HPI above).  Objective  Vitals:   09/29/16 1351  BP: 124/64  Pulse: 84  Resp: 16  Temp: 98 F (36.7 C)  SpO2: 97%  Weight: 124 lb 8 oz (56.5 kg)  Height: _0  (1.6 m)    Body mass index is 22.05 kg/m.  Physical Exam  Constitutional: Patient appears well-developed and well-nourished.  No distress.  HEENT: head atraumatic, normocephalic,neck supple, throat within normal limits Cardiovascular: Normal rate, regular rhythm and normal heart sounds.  2/6 Systolic ejection  murmur heard. No BLE edema. Pulmonary/Chest: Effort normal and breath sounds normal. No respiratory distress. Abdominal: Soft.  There is no tenderness. Psychiatric: Patient has a normal mood and affect. behavior is normal. Judgment and thought content normal.  Recent Results (from the past 2160 hour(s))  POCT HgB A1C     Status: Abnormal   Collection Time: 09/29/16   2:18 PM  Result Value Ref Range   Hemoglobin A1C 8.3     Diabetic Foot Exam: Diabetic Foot Exam - Simple   Simple Foot Form Diabetic Foot exam was performed with the following findings:  Yes 09/29/2016  2:33 PM  Visual Inspection No deformities, no ulcerations, no other skin breakdown bilaterally:  Yes Sensation Testing Intact to touch and monofilament testing bilaterally:  Yes Pulse Check Posterior Tibialis and Dorsalis pulse intact bilaterally:  Yes Comments      PHQ2/9: Depression screen [facility] 2/9 09/28/2016 04/01/2016 03/29/2015 09/26/2014  Decreased Interest 0 0 0 0  Down, Depressed, Hopeless 0 0 0 0  PHQ - 2 Score 0 0 0 0     Fall Risk: Fall Risk  09/28/2016 04/01/2016  03/29/2015 09/26/2014  Falls in the past year? No No No No      Assessment & Plan  1. Type 1 diabetes mellitus with microalbuminuria (HCC)  Sees Dr. Eddie Dibbles, last hgbA1C 3 months ago and it was rechecked by CMA by accident today, it has improved, continue follow up with Dr. Eddie Dibbles we will get labs and send her a copy  - POCT HgB A1C - Urine Microalbumin w/creat. ratio  2. Mild cognitive impairment with memory loss  - TSH Seen by Dr. Manuella Ghazi in the past, and was told secondary to DM  3. Benign hypertension  - Olmesartan-Amlodipine-HCTZ 40-5-25 MG TABS; Take 1 tablet by mouth daily.  Dispense: 30 tablet; Refill: 5 - COMPLETE METABOLIC PANEL WITH GFR - CBC with Differential/Platelet  4. Dyslipidemia  - atorvastatin (LIPITOR) 80 MG tablet; Take 1 tablet (80 mg total) by mouth daily.  Dispense: 30 tablet; Refill: 5 - Lipid panel  5. Atherosclerosis of aorta (HCC)  Continue statin therapy, and good diabetes control, also taking aspirin daily   6. Vitamin D deficiency  - VITAMIN D 25 Hydroxy (Vit-D Deficiency, Fractures)  7. Age-related osteoporosis without current pathological fracture  - DG Bone Density; Future Never on medication, we will recheck labs and if needed we will discuss therapy

## 2016-10-01 ENCOUNTER — Encounter: Payer: Self-pay | Admitting: Family Medicine

## 2016-10-13 ENCOUNTER — Other Ambulatory Visit: Payer: Medicare Other

## 2016-10-13 DIAGNOSIS — I10 Essential (primary) hypertension: Secondary | ICD-10-CM | POA: Diagnosis not present

## 2016-10-13 DIAGNOSIS — E785 Hyperlipidemia, unspecified: Secondary | ICD-10-CM | POA: Diagnosis not present

## 2016-10-13 DIAGNOSIS — G3184 Mild cognitive impairment, so stated: Secondary | ICD-10-CM | POA: Diagnosis not present

## 2016-10-13 DIAGNOSIS — E559 Vitamin D deficiency, unspecified: Secondary | ICD-10-CM | POA: Diagnosis not present

## 2016-10-13 DIAGNOSIS — R809 Proteinuria, unspecified: Secondary | ICD-10-CM | POA: Diagnosis not present

## 2016-10-13 DIAGNOSIS — E1029 Type 1 diabetes mellitus with other diabetic kidney complication: Secondary | ICD-10-CM | POA: Diagnosis not present

## 2016-10-13 LAB — CBC WITH DIFFERENTIAL/PLATELET
Basophils Absolute: 45 cells/uL (ref 0–200)
Basophils Relative: 1 %
Eosinophils Absolute: 90 cells/uL (ref 15–500)
Eosinophils Relative: 2 %
HCT: 39.1 % (ref 35.0–45.0)
Hemoglobin: 12.4 g/dL (ref 11.7–15.5)
Lymphocytes Relative: 44 %
Lymphs Abs: 1980 cells/uL (ref 850–3900)
MCH: 29.1 pg (ref 27.0–33.0)
MCHC: 31.7 g/dL — ABNORMAL LOW (ref 32.0–36.0)
MCV: 91.8 fL (ref 80.0–100.0)
MPV: 11 fL (ref 7.5–12.5)
Monocytes Absolute: 360 cells/uL (ref 200–950)
Monocytes Relative: 8 %
Neutro Abs: 2025 cells/uL (ref 1500–7800)
Neutrophils Relative %: 45 %
Platelets: 178 10*3/uL (ref 140–400)
RBC: 4.26 MIL/uL (ref 3.80–5.10)
RDW: 13.9 % (ref 11.0–15.0)
WBC: 4.5 10*3/uL (ref 3.8–10.8)

## 2016-10-14 LAB — COMPLETE METABOLIC PANEL WITH GFR
ALBUMIN: 3.8 g/dL (ref 3.6–5.1)
ALT: 25 U/L (ref 6–29)
AST: 25 U/L (ref 10–35)
Alkaline Phosphatase: 57 U/L (ref 33–130)
BILIRUBIN TOTAL: 0.4 mg/dL (ref 0.2–1.2)
BUN: 19 mg/dL (ref 7–25)
CO2: 28 mmol/L (ref 20–32)
CREATININE: 0.63 mg/dL (ref 0.60–0.93)
Calcium: 9.2 mg/dL (ref 8.6–10.4)
Chloride: 101 mmol/L (ref 98–110)
GFR, Est African American: >89 mL/min (ref >=60)
GFR, Est Non African American: 89 mL/min (ref >=60)
GLUCOSE: 335 mg/dL — AB (ref 65–99)
Potassium: 4.4 mmol/L (ref 3.5–5.3)
SODIUM: 137 mmol/L (ref 135–146)
Total Protein: 6.8 g/dL (ref 6.1–8.1)

## 2016-10-14 LAB — TSH: TSH: 2.13 m[IU]/L

## 2016-10-14 LAB — LIPID PANEL
Cholesterol: 146 mg/dL (ref <200)
HDL: 61 mg/dL (ref >50)
LDL Cholesterol: 74 mg/dL (ref <100)
Total CHOL/HDL Ratio: 2.4 Ratio (ref <5.0)
Triglycerides: 56 mg/dL (ref <150)
VLDL: 11 mg/dL (ref <30)

## 2016-10-14 LAB — VITAMIN D 25 HYDROXY (VIT D DEFICIENCY, FRACTURES): VIT D 25 HYDROXY: 38 ng/mL (ref 30–100)

## 2016-10-14 LAB — MICROALBUMIN / CREATININE URINE RATIO
Creatinine, Urine: 78 mg/dL (ref 20–320)
MICROALB UR: 0.8 mg/dL
MICROALB/CREAT RATIO: 10 ug/mg{creat} (ref <30)

## 2016-10-26 DIAGNOSIS — H26492 Other secondary cataract, left eye: Secondary | ICD-10-CM | POA: Diagnosis not present

## 2016-11-18 ENCOUNTER — Ambulatory Visit
Admission: RE | Admit: 2016-11-18 | Discharge: 2016-11-18 | Disposition: A | Discharge status: Home / Self Care | Payer: Medicare Other | Source: Ambulatory Visit | Attending: Family Medicine | Admitting: Family Medicine

## 2016-11-18 DIAGNOSIS — Z780 Asymptomatic menopausal state: Secondary | ICD-10-CM | POA: Diagnosis not present

## 2016-11-18 DIAGNOSIS — E119 Type 2 diabetes mellitus without complications: Secondary | ICD-10-CM | POA: Insufficient documentation

## 2016-11-18 DIAGNOSIS — M810 Age-related osteoporosis without current pathological fracture: Secondary | ICD-10-CM | POA: Diagnosis not present

## 2016-11-23 ENCOUNTER — Telehealth: Payer: Self-pay

## 2016-11-23 ENCOUNTER — Telehealth: Payer: Self-pay | Admitting: Family Medicine

## 2016-11-23 NOTE — Telephone Encounter (Signed)
Left message for patient to call regarding results. 

## 2016-11-23 NOTE — Telephone Encounter (Signed)
PT HAS RETURNED YOUR CALL. WAITED FOR YOU FOR AWHILE TOLD HER I WOULD HAVE YOU CALL HER BACK. PLEASE RETURN HER CALL

## 2016-11-23 NOTE — Telephone Encounter (Signed)
-----   Message from Steele Sizer, MD sent at 11/20/2016  3:15 PM EDT ----- She has osteoporosis and needs to return to discuss therapy

## 2016-11-24 ENCOUNTER — Encounter: Payer: Self-pay | Admitting: Family Medicine

## 2016-11-24 ENCOUNTER — Ambulatory Visit (INDEPENDENT_AMBULATORY_CARE_PROVIDER_SITE_OTHER): Payer: Medicare Other | Admitting: Family Medicine

## 2016-11-24 VITALS — BP 148/68 | HR 85 | Temp 98.0°F | Resp 16 | Ht 63.0 in | Wt 127.8 lb

## 2016-11-24 DIAGNOSIS — M810 Age-related osteoporosis without current pathological fracture: Secondary | ICD-10-CM

## 2016-11-24 MED ORDER — ALENDRONATE SODIUM 70 MG PO TABS: 70.0000 mg | ORAL_TABLET | ORAL | 12 refills | Status: DC

## 2016-11-24 NOTE — Patient Instructions (Signed)
Alendronate tablets What is this medicine? ALENDRONATE (a LEN droe nate) slows calcium loss from bones. It helps to make normal healthy bone and to slow bone loss in people with Paget's disease and osteoporosis. It may be used in others at risk for bone loss. This medicine may be used for other purposes; ask your health care provider or pharmacist if you have questions. COMMON BRAND NAME(S): Fosamax What should I tell my health care provider before I take this medicine? They need to know if you have any of these conditions: -dental disease -esophagus, stomach, or intestine problems, like acid reflux or GERD -kidney disease -low blood calcium -low vitamin D -problems sitting or standing 30 minutes -trouble swallowing -an unusual or allergic reaction to alendronate, other medicines, foods, dyes, or preservatives -pregnant or trying to get pregnant -breast-feeding How should I use this medicine? You must take this medicine exactly as directed or you will lower the amount of the medicine you absorb into your body or you may cause yourself harm. Take this medicine by mouth first thing in the morning, after you are up for the day. Do not eat or drink anything before you take your medicine. Swallow the tablet with a full glass (6 to 8 fluid ounces) of plain water. Do not take this medicine with any other drink. Do not chew or crush the tablet. After taking this medicine, do not eat breakfast, drink, or take any medicines or vitamins for at least 30 minutes. Sit or stand up for at least 30 minutes after you take this medicine; do not lie down. Do not take your medicine more often than directed. Talk to your pediatrician regarding the use of this medicine in children. Special care may be needed. Overdosage: If you think you have taken too much of this medicine contact a poison control center or emergency room at once. NOTE: This medicine is only for you. Do not share this medicine with others. What if I  miss a dose? If you miss a dose, do not take it later in the day. Continue your normal schedule starting the next morning. Do not take double or extra doses. What may interact with this medicine? -aluminum hydroxide -antacids -aspirin -calcium supplements -drugs for inflammation like ibuprofen, naproxen, and others -iron supplements -magnesium supplements -vitamins with minerals This list may not describe all possible interactions. Give your health care provider a list of all the medicines, herbs, non-prescription drugs, or dietary supplements you use. Also tell them if you smoke, drink alcohol, or use illegal drugs. Some items may interact with your medicine. What should I watch for while using this medicine? Visit your doctor or health care professional for regular checks ups. It may be some time before you see benefit from this medicine. Do not stop taking your medicine except on your doctor's advice. Your doctor or health care professional may order blood tests and other tests to see how you are doing. You should make sure you get enough calcium and vitamin D while you are taking this medicine, unless your doctor tells you not to. Discuss the foods you eat and the vitamins you take with your health care professional. Some people who take this medicine have severe bone, joint, and/or muscle pain. This medicine may also increase your risk for a broken thigh bone. Tell your doctor right away if you have pain in your upper leg or groin. Tell your doctor if you have any pain that does not go away or that gets worse.  This medicine can make you more sensitive to the sun. If you get a rash while taking this medicine, sunlight may cause the rash to get worse. Keep out of the sun. If you cannot avoid being in the sun, wear protective clothing and use sunscreen. Do not use sun lamps or tanning beds/booths. What side effects may I notice from receiving this medicine? Side effects that you should report to  your doctor or health care professional as soon as possible: -allergic reactions like skin rash, itching or hives, swelling of the face, lips, or tongue -black or tarry stools -bone, muscle or joint pain -changes in vision -chest pain -heartburn or stomach pain -jaw pain, especially after dental work -pain or trouble when swallowing -redness, blistering, peeling or loosening of the skin, including inside the mouth Side effects that usually do not require medical attention (report to your doctor or health care professional if they continue or are bothersome): -changes in taste -diarrhea or constipation -eye pain or itching -headache -nausea or vomiting -stomach gas or fullness This list may not describe all possible side effects. Call your doctor for medical advice about side effects. You may report side effects to FDA at 1-800-FDA-1088. Where should I keep my medicine? Keep out of the reach of children. Store at room temperature of 15 and 30 degrees C (59 and 86 degrees F). Throw away any unused medicine after the expiration date. NOTE: This sheet is a summary. It may not cover all possible information. If you have questions about this medicine, talk to your doctor, pharmacist, or health care provider.  2018 Elsevier/Gold Standard (2010-08-22 08:56:09) Osteoporosis Osteoporosis is the thinning and loss of density in the bones. Osteoporosis makes the bones more brittle, fragile, and likely to break (fracture). Over time, osteoporosis can cause the bones to become so weak that they fracture after a simple fall. The bones most likely to fracture are the bones in the hip, wrist, and spine. What are the causes? The exact cause is not known. What increases the risk? Anyone can develop osteoporosis. You may be at greater risk if you have a family history of the condition or have poor nutrition. You may also have a higher risk if you are:  Female.  4 years old or older.  A smoker.  Not  physically active.  White or Asian.  Slender.  What are the signs or symptoms? A fracture might be the first sign of the disease, especially if it results from a fall or injury that would not usually cause a bone to break. Other signs and symptoms include:  Low back and neck pain.  Stooped posture.  Height loss.  How is this diagnosed? To make a diagnosis, your health care provider may:  Take a medical history.  Perform a physical exam.  Order tests, such as: ? A bone mineral density test. ? A dual-energy X-ray absorptiometry test.  How is this treated? The goal of osteoporosis treatment is to strengthen your bones to reduce your risk of a fracture. Treatment may involve:  Making lifestyle changes, such as: ? Eating a diet rich in calcium. ? Doing weight-bearing and muscle-strengthening exercises. ? Stopping tobacco use. ? Limiting alcohol intake.  Taking medicine to slow the process of bone loss or to increase bone density.  Monitoring your levels of calcium and vitamin D.  Follow these instructions at home:  Include calcium and vitamin D in your diet. Calcium is important for bone health, and vitamin D helps the body  absorb calcium.  Perform weight-bearing and muscle-strengthening exercises as directed by your health care provider.  Do not use any tobacco products, including cigarettes, chewing tobacco, and electronic cigarettes. If you need help quitting, ask your health care provider.  Limit your alcohol intake.  Take medicines only as directed by your health care provider.  Keep all follow-up visits as directed by your health care provider. This is important.  Take precautions at home to lower your risk of falling, such as: ? Keeping rooms well lit and clutter free. ? Installing safety rails on stairs. ? Using rubber mats in the bathroom and other areas that are often wet or slippery. Get help right away if: You fall or injure yourself. This information  is not intended to replace advice given to you by your health care provider. Make sure you discuss any questions you have with your health care provider. Document Released: 12/03/2004 Document Revised: 07/29/2015 Document Reviewed: 08/03/2013 Elsevier Interactive Patient Education  2017 [employer].

## 2016-11-24 NOTE — Progress Notes (Signed)
Name: [name]   MRN: [id number]    DOB: 15-Mar-1940   Date:11/24/2016       Progress Note  Subjective  Chief Complaint  Chief Complaint  Patient presents with  . Osteoporosis    New Dx on Bone Density in to discuss medication.     HPI  Osteoporosis: found on bone density, -2.4 left hip and -3.2 on L1-2 spine ( lower levels excluded because of DDD). She denies any pain, trying to eat a high calcium and high vitamin D diet, also taking otc calcium plus D, no history of fractures in the past. No family history of osteoporosis.  Patient Active Problem List   Diagnosis Date Noted  . Atherosclerosis of aorta ([place]) 04/01/2016  . Aortic sclerosis 09/19/2014  . Benign hypertension 09/19/2014  . Calculus of gallbladder 09/19/2014  . Carpal tunnel syndrome 09/19/2014  . Detached retina 09/19/2014  . Type 1 diabetes mellitus with microalbuminuria ([place]) 09/19/2014  . Dyslipidemia 09/19/2014  . Hypophosphatemia 09/19/2014  . Mild cognitive impairment with memory loss 09/19/2014  . Microalbuminuria 09/19/2014  . OP (osteoporosis) 09/19/2014  . Menopause 09/19/2014  . Ptosis of eyelid 09/19/2014  . Urge incontinence 08/01/2013  . Vitamin D deficiency 02/24/2007    Past Surgical History:  Procedure Laterality Date  . BREAST EXCISIONAL BIOPSY Left    neg  . BREAST SURGERY  1970  . EYE SURGERY      Family History  Problem Relation Age of Onset  . Alzheimer's disease Mother   . Diabetes Mother   . Arthritis Mother   . Diabetes Father   . Hypertension Father   . Hyperlipidemia Father   . Stroke Brother   . Multiple sclerosis Daughter     Social History   Social History  . Marital status: Single    Spouse name: N/A  . Number of children: N/A  . Years of education: N/A   Occupational History  . Not on file.   Social History Main Topics  . Smoking status: Never Smoker  . Smokeless tobacco: Never Used  . Alcohol use No  . Drug use: No  . Sexual activity: Not  Currently   Other Topics Concern  . Not on file   Social History Narrative  . No narrative on file     Current Outpatient Prescriptions:  .  ACCU-CHEK AVIVA PLUS test strip, USE 1 STRIP UP TO 4 TIMES DAILY, Disp: 100 each, Rfl: 3 .  aspirin EC 81 MG tablet, Take 81 mg by mouth daily., Disp: , Rfl:  .  atorvastatin (LIPITOR) 80 MG tablet, Take 1 tablet (80 mg total) by mouth daily., Disp: 30 tablet, Rfl: 5 .  blood glucose meter kit and supplies, , Disp: , Rfl:  .  Calcium Carbonate-Vitamin D (CALCIUM-VITAMIN D) 600-125 MG-UNIT TABS, Take 1 tablet by mouth daily., Disp: , Rfl:  .  Cholecalciferol (VITAMIN D3) 2000 units capsule, TAKE 1 TABLET (2,000 UNITS TOTAL) BY MOUTH DAILY., Disp: 100 capsule, Rfl: 2 .  Cholecalciferol (VITAMIN D3) 2000 units capsule, Take 2,000 Units by mouth daily., Disp: , Rfl:  .  [employer] ALCOHOL SWABS PADS, USE 4 TIMES DAILY TO WIPE SKIN BEFORE USING INSULIN, Disp: 400 each, Rfl: 2 .  insulin aspart protamine- aspart (NOVOLOG MIX 70/30) (70-30) 100 UNIT/ML injection, Inject 32 units before breakfast and 6 units before lunch and 12u before supper as directed., Disp: , Rfl:  .  Olmesartan-Amlodipine-HCTZ 40-5-25 MG TABS, Take 1 tablet by mouth daily., Disp: 30 tablet,  Rfl: 5 .  alendronate (FOSAMAX) 70 MG tablet, Take 1 tablet (70 mg total) by mouth once a week. Take with a full glass of water on an empty stomach. Sit up or stand for one hour after taking pill, Disp: 4 tablet, Rfl: 12  Allergies  Allergen Reactions  . Colesevelam Hcl     scotomas  . Daucus [place]ota   . Penicillins Itching     ROS  Ten systems reviewed and is negative except as mentioned in HPI   Objective  Vitals:   11/24/16 1447  BP: (!) 148/68  Pulse: 85  Resp: 16  Temp: 98 F (36.7 C)  TempSrc: Oral  SpO2: 98%  Weight: 127 lb 12.8 oz (58 kg)  Height: '5\' 3"'  (1.6 m)    Body mass index is 22.64 kg/m.  Physical Exam  Constitutional: Patient appears well-developed and  well-nourished. No distress.  HEENT: head atraumatic, normocephalic, pupils equal and reactive to light,  neck supple, throat within normal limits Cardiovascular: Normal rate, regular rhythm and normal heart sounds. 3/6 SEM  murmur heard. No BLE edema. Pulmonary/Chest: Effort normal and breath sounds normal. No respiratory distress. Abdominal: Soft.  There is no tenderness. Muscular Skeletal: no kyphosis or pain during palpation of spinal processes  Psychiatric: Patient has a normal mood and affect. behavior is normal. Judgment and thought content normal.  Recent Results (from the past 2160 hour(s))  HM DIABETES EYE EXAM     Status: Abnormal   Collection Time: 08/31/16 12:00 AM  Result Value Ref Range   HM Diabetic Eye Exam Retinopathy (A) No Retinopathy    Comment: proliferative diabetic (Dr. Benay Pillow with [facility])  HM DIABETES EYE EXAM     Status: Abnormal   Collection Time: 08/31/16 12:00 AM  Result Value Ref Range   HM Diabetic Eye Exam Retinopathy (A) No Retinopathy    Comment: [facility], Dr. Ky Barban treatment needed   POCT HgB A1C     Status: Abnormal   Collection Time: 09/29/16  2:18 PM  Result Value Ref Range   Hemoglobin A1C 8.3   COMPLETE METABOLIC PANEL WITH GFR     Status: Abnormal   Collection Time: 10/13/16  9:36 AM  Result Value Ref Range   Sodium 137 135 - 146 mmol/L   Potassium 4.4 3.5 - 5.3 mmol/L   Chloride 101 98 - 110 mmol/L   CO2 28 20 - 32 mmol/L    Comment: ** Please note change in reference range(s). **      Glucose, Bld 335 (H) 65 - 99 mg/dL   BUN 19 7 - 25 mg/dL   Creat 0.63 0.60 - 0.93 mg/dL    Comment:   For patients > or = 74 years of age: The upper reference limit for Creatinine is approximately 13% higher for people identified as African-American.      Total Bilirubin 0.4 0.2 - 1.2 mg/dL   Alkaline Phosphatase 57 33 - 130 U/L   AST 25 10 - 35 U/L   ALT 25 6 - 29 U/L   Total Protein 6.8 6.1 - 8.1 g/dL   Albumin  3.8 3.6 - 5.1 g/dL   Calcium 9.2 8.6 - 10.4 mg/dL   GFR, Est African American >89 >=60 mL/min   GFR, Est Non African American 89 >=60 mL/min  CBC with Differential/Platelet     Status: Abnormal   Collection Time: 10/13/16  9:36 AM  Result Value Ref Range   WBC 4.5 3.8 - 10.8  K/uL   RBC 4.26 3.80 - 5.10 MIL/uL   Hemoglobin 12.4 11.7 - 15.5 g/dL   HCT 39.1 35.0 - 45.0 %   MCV 91.8 80.0 - 100.0 fL   MCH 29.1 27.0 - 33.0 pg   MCHC 31.7 (L) 32.0 - 36.0 g/dL   RDW 13.9 11.0 - 15.0 %   Platelets 178 140 - 400 K/uL   MPV 11.0 7.5 - 12.5 fL   Neutro Abs 2,025 1,500 - 7,800 cells/uL   Lymphs Abs 1,980 850 - 3,900 cells/uL   Monocytes Absolute 360 200 - 950 cells/uL   Eosinophils Absolute 90 15 - 500 cells/uL   Basophils Absolute 45 0 - 200 cells/uL   Neutrophils Relative % 45 %   Lymphocytes Relative 44 %   Monocytes Relative 8 %   Eosinophils Relative 2 %   Basophils Relative 1 %   Smear Review Criteria for review not met   Lipid panel     Status: None   Collection Time: 10/13/16  9:36 AM  Result Value Ref Range   Cholesterol 146 <200 mg/dL   Triglycerides 56 <150 mg/dL   HDL 61 >50 mg/dL   Total CHOL/HDL Ratio 2.4 <5.0 Ratio   VLDL 11 <30 mg/dL   LDL Cholesterol 74 <100 mg/dL  VITAMIN D 25 Hydroxy (Vit-D Deficiency, Fractures)     Status: None   Collection Time: 10/13/16  9:36 AM  Result Value Ref Range   Vit D, 25-Hydroxy 38 30 - 100 ng/mL    Comment: Vitamin D Status           25-OH Vitamin D        Deficiency                <20 ng/mL        Insufficiency         20 - 29 ng/mL        Optimal             > or = 30 ng/mL   For 25-OH Vitamin D testing on patients on D2-supplementation and patients for whom quantitation of D2 and D3 fractions is required, the QuestAssureD 25-OH VIT D, (D2,D3), LC/MS/MS is recommended: order code [phone] (patients > 2 yrs).   TSH     Status: None   Collection Time: 10/13/16  9:36 AM  Result Value Ref Range   TSH 2.13 mIU/L    Comment:    Reference Range   > or = 20 Years  0.40-4.50   Pregnancy Range First trimester  0.26-2.66 Second trimester 0.55-2.73 Third trimester  0.43-2.91     Urine Microalbumin w/creat. ratio     Status: None   Collection Time: 10/13/16  9:36 AM  Result Value Ref Range   Creatinine, Urine 78 20 - 320 mg/dL   Microalb, Ur 0.8 Not estab mg/dL   Microalb Creat Ratio 10 <30 mcg/mg creat    Comment: The ADA has defined abnormalities in albumin excretion as follows:           Category           Result                            (mcg/mg creatinine)                 Normal:    <30       Microalbuminuria:    30 - 299  Clinical albuminuria:    > or = 300   The ADA recommends that at least two of three specimens collected within a 3 - 6 month period be abnormal before considering a patient to be within a diagnostic category.       PHQ2/9: Depression screen [facility] 2/9 09/28/2016 04/01/2016 03/29/2015 09/26/2014  Decreased Interest 0 0 0 0  Down, Depressed, Hopeless 0 0 0 0  PHQ - 2 Score 0 0 0 0     Fall Risk: Fall Risk  09/28/2016 04/01/2016 03/29/2015 09/26/2014  Falls in the past year? No No No No     Assessment & Plan  1. Age-related osteoporosis without current pathological fracture  - alendronate (FOSAMAX) 70 MG tablet; Take 1 tablet (70 mg total) by mouth once a week. Take with a full glass of water on an empty stomach. Sit up or stand for one hour after taking pill  Dispense: 4 tablet; Refill: 12 She sees Dr. Eddie Dibbles for diabetes and explained that she can also manage her osteoporosis if she would like to pursue other therapies  Discussed options such as Prolia, monthly injectable diphosphonate and Forteo, but her and her husband prefer to try Fosamax at this time. Discussed in detail how to take medication

## 2016-12-17 DIAGNOSIS — E10649 Type 1 diabetes mellitus with hypoglycemia without coma: Secondary | ICD-10-CM | POA: Diagnosis not present

## 2016-12-17 DIAGNOSIS — E785 Hyperlipidemia, unspecified: Secondary | ICD-10-CM | POA: Diagnosis not present

## 2016-12-17 DIAGNOSIS — E1065 Type 1 diabetes mellitus with hyperglycemia: Secondary | ICD-10-CM | POA: Diagnosis not present

## 2016-12-17 DIAGNOSIS — R634 Abnormal weight loss: Secondary | ICD-10-CM | POA: Diagnosis not present

## 2016-12-17 DIAGNOSIS — M810 Age-related osteoporosis without current pathological fracture: Secondary | ICD-10-CM | POA: Diagnosis not present

## 2016-12-23 ENCOUNTER — Ambulatory Visit (INDEPENDENT_AMBULATORY_CARE_PROVIDER_SITE_OTHER): Payer: Medicare Other

## 2016-12-23 DIAGNOSIS — Z23 Encounter for immunization: Secondary | ICD-10-CM

## 2017-05-24 DIAGNOSIS — H35373 Puckering of macula, bilateral: Secondary | ICD-10-CM | POA: Diagnosis not present

## 2017-05-24 DIAGNOSIS — E113599 Type 2 diabetes mellitus with proliferative diabetic retinopathy without macular edema, unspecified eye: Secondary | ICD-10-CM | POA: Diagnosis not present

## 2017-05-27 ENCOUNTER — Encounter: Payer: Self-pay | Admitting: Family Medicine

## 2017-06-15 DIAGNOSIS — E1065 Type 1 diabetes mellitus with hyperglycemia: Secondary | ICD-10-CM | POA: Diagnosis not present

## 2017-06-15 DIAGNOSIS — M810 Age-related osteoporosis without current pathological fracture: Secondary | ICD-10-CM | POA: Diagnosis not present

## 2017-06-15 DIAGNOSIS — E785 Hyperlipidemia, unspecified: Secondary | ICD-10-CM | POA: Diagnosis not present

## 2017-06-15 DIAGNOSIS — E10649 Type 1 diabetes mellitus with hypoglycemia without coma: Secondary | ICD-10-CM | POA: Diagnosis not present

## 2017-10-04 ENCOUNTER — Telehealth: Payer: Self-pay | Admitting: Family Medicine

## 2017-10-04 NOTE — Telephone Encounter (Signed)
Opened in error

## 2017-10-07 ENCOUNTER — Ambulatory Visit (INDEPENDENT_AMBULATORY_CARE_PROVIDER_SITE_OTHER): Payer: Medicare Other

## 2017-10-07 VITALS — BP 138/64 | HR 68 | Temp 98.3°F | Resp 12 | Ht 63.0 in | Wt 130.9 lb

## 2017-10-07 DIAGNOSIS — Z0000 Encounter for general adult medical examination without abnormal findings: Secondary | ICD-10-CM | POA: Diagnosis not present

## 2017-10-07 DIAGNOSIS — Z1231 Encounter for screening mammogram for malignant neoplasm of breast: Secondary | ICD-10-CM | POA: Diagnosis not present

## 2017-10-07 DIAGNOSIS — Z1239 Encounter for other screening for malignant neoplasm of breast: Secondary | ICD-10-CM

## 2017-10-07 NOTE — Patient Instructions (Addendum)
[name] , Thank you for taking time to come for your Medicare Wellness Visit. I appreciate your ongoing commitment to your health goals. Please review the following plan we discussed and let me know if I can assist you in the future.   Screening recommendations/referrals: Colorectal Screening: Up to date Mammogram: Please call to schedule your appointment Bone Density: Up to date  Vision and Dental Exams: Recommended annual ophthalmology exams for early detection of glaucoma and other disorders of the eye Recommended annual dental exams for proper oral hygiene  Diabetic Exams: Recommended annual diabetic eye exams for early detection of retinopathy Recommended annual diabetic foot exams for early detection of peripheral neuropathy.  Diabetic Eye Exam: Up to date Diabetic Foot Exam: Please schedule an appointment with Dr. Ancil Boozer  Vaccinations: Influenza vaccine: Up to date Pneumococcal vaccine: Up to date Tdap vaccine: Up to date Shingles vaccine: Please call your insurance company to determine your out of pocket expense for the Shingrix vaccine. You may also receive this vaccine at your local pharmacy or Health Dept.   Advanced directives: Advance directive discussed with you today. I have provided a copy for you to complete at home and have notarized. Once this is complete please bring a copy in to our office so we can scan it into your chart.  Goals: Recommend to drink at least 6-8 8oz glasses of water per day.  Next appointment: Please schedule your Annual Wellness Visit with your Nurse Health Advisor in one year.  Please schedule an appointment with Dr. Ancil Boozer within the next 30 days for follow up after today's Annual Wellness Visit.  Preventive Care 75 Years and Older, Female Preventive care refers to lifestyle choices and visits with your health care provider that can promote health and wellness. What does preventive care include?  A yearly physical exam. This is also  called an annual well check.  Dental exams once or twice a year.  Routine eye exams. Ask your health care provider how often you should have your eyes checked.  Personal lifestyle choices, including:  Daily care of your teeth and gums.  Regular physical activity.  Eating a healthy diet.  Avoiding tobacco and drug use.  Limiting alcohol use.  Practicing safe sex.  Taking low-dose aspirin every day.  Taking vitamin and mineral supplements as recommended by your health care provider. What happens during an annual well check? The services and screenings done by your health care provider during your annual well check will depend on your age, overall health, lifestyle risk factors, and family history of disease. Counseling  Your health care provider may ask you questions about your:  Alcohol use.  Tobacco use.  Drug use.  Emotional well-being.  Home and relationship well-being.  Sexual activity.  Eating habits.  History of falls.  Memory and ability to understand (cognition).  Work and work Statistician.  Reproductive health. Screening  You may have the following tests or measurements:  Height, weight, and BMI.  Blood pressure.  Lipid and cholesterol levels. These may be checked every 5 years, or more frequently if you are over 75 years old.  Skin check.  Lung cancer screening. You may have this screening every year starting at age 75 if you have a 30-pack-year history of smoking and currently smoke or have quit within the past 15 years.  Fecal occult blood test (FOBT) of the stool. You may have this test every year starting at age 75.  Flexible sigmoidoscopy or colonoscopy. You may have a  sigmoidoscopy every 5 years or a colonoscopy every 10 years starting at age 75.  Hepatitis C blood test.  Hepatitis B blood test.  Sexually transmitted disease (STD) testing.  Diabetes screening. This is done by checking your blood sugar (glucose) after you have not  eaten for a while (fasting). You may have this done every 1-3 years.  Bone density scan. This is done to screen for osteoporosis. You may have this done starting at age 75.  Mammogram. This may be done every 1-2 years. Talk to your health care provider about how often you should have regular mammograms. Talk with your health care provider about your test results, treatment options, and if necessary, the need for more tests. Vaccines  Your health care provider may recommend certain vaccines, such as:  Influenza vaccine. This is recommended every year.  Tetanus, diphtheria, and acellular pertussis (Tdap, Td) vaccine. You may need a Td booster every 10 years.  Zoster vaccine. You may need this after age 75.  Pneumococcal 13-valent conjugate (PCV13) vaccine. One dose is recommended after age 5.  Pneumococcal polysaccharide (PPSV23) vaccine. One dose is recommended after age 9. Talk to your health care provider about which screenings and vaccines you need and how often you need them. This information is not intended to replace advice given to you by your health care provider. Make sure you discuss any questions you have with your health care provider. Document Released: 03/22/2015 Document Revised: 11/13/2015 Document Reviewed: 12/25/2014 Elsevier Interactive Patient Education  2017 [employer] Prevention in the Home Falls can cause injuries. They can happen to people of all ages. There are many things you can do to make your home safe and to help prevent falls. What can I do on the outside of my home?  Regularly fix the edges of walkways and driveways and fix any cracks.  Remove anything that might make you trip as you walk through a door, such as a raised step or threshold.  Trim any bushes or trees on the path to your home.  Use bright outdoor lighting.  Clear any walking paths of anything that might make someone trip, such as rocks or tools.  Regularly check to see if  handrails are loose or broken. Make sure that both sides of any steps have handrails.  Any raised decks and porches should have guardrails on the edges.  Have any leaves, snow, or ice cleared regularly.  Use sand or salt on walking paths during winter.  Clean up any spills in your garage right away. This includes oil or grease spills. What can I do in the bathroom?  Use night lights.  Install grab bars by the toilet and in the tub and shower. Do not use towel bars as grab bars.  Use non-skid mats or decals in the tub or shower.  If you need to sit down in the shower, use a plastic, non-slip stool.  Keep the floor dry. Clean up any water that spills on the floor as soon as it happens.  Remove soap buildup in the tub or shower regularly.  Attach bath mats securely with double-sided non-slip rug tape.  Do not have throw rugs and other things on the floor that can make you trip. What can I do in the bedroom?  Use night lights.  Make sure that you have a light by your bed that is easy to reach.  Do not use any sheets or blankets that are too big for your bed. They  should not hang down onto the floor.  Have a firm chair that has side arms. You can use this for support while you get dressed.  Do not have throw rugs and other things on the floor that can make you trip. What can I do in the kitchen?  Clean up any spills right away.  Avoid walking on wet floors.  Keep items that you use a lot in easy-to-reach places.  If you need to reach something above you, use a strong step stool that has a grab bar.  Keep electrical cords out of the way.  Do not use floor polish or wax that makes floors slippery. If you must use wax, use non-skid floor wax.  Do not have throw rugs and other things on the floor that can make you trip. What can I do with my stairs?  Do not leave any items on the stairs.  Make sure that there are handrails on both sides of the stairs and use them. Fix  handrails that are broken or loose. Make sure that handrails are as long as the stairways.  Check any carpeting to make sure that it is firmly attached to the stairs. Fix any carpet that is loose or worn.  Avoid having throw rugs at the top or bottom of the stairs. If you do have throw rugs, attach them to the floor with carpet tape.  Make sure that you have a light switch at the top of the stairs and the bottom of the stairs. If you do not have them, ask someone to add them for you. What else can I do to help prevent falls?  Wear shoes that:  Do not have high heels.  Have rubber bottoms.  Are comfortable and fit you well.  Are closed at the toe. Do not wear sandals.  If you use a stepladder:  Make sure that it is fully opened. Do not climb a closed stepladder.  Make sure that both sides of the stepladder are locked into place.  Ask someone to hold it for you, if possible.  Clearly mark and make sure that you can see:  Any grab bars or handrails.  First and last steps.  Where the edge of each step is.  Use tools that help you move around (mobility aids) if they are needed. These include:  Canes.  Walkers.  Scooters.  Crutches.  Turn on the lights when you go into a dark area. Replace any light bulbs as soon as they burn out.  Set up your furniture so you have a clear path. Avoid moving your furniture around.  If any of your floors are uneven, fix them.  If there are any pets around you, be aware of where they are.  Review your medicines with your doctor. Some medicines can make you feel dizzy. This can increase your chance of falling. Ask your doctor what other things that you can do to help prevent falls. This information is not intended to replace advice given to you by your health care provider. Make sure you discuss any questions you have with your health care provider. Document Released: 12/20/2008 Document Revised: 08/01/2015 Document Reviewed:  03/30/2014 Elsevier Interactive Patient Education  2017 [employer].

## 2017-10-07 NOTE — Progress Notes (Addendum)
Subjective:   [name] is a 78 y.o. female who presents for Medicare Annual (Subsequent) preventive examination.  Review of Systems:  N/A Cardiac Risk Factors include: advanced age (>68mn, >>47women);diabetes mellitus;dyslipidemia;hypertension;sedentary lifestyle     Objective:     Vitals: BP 138/64 (BP Location: Left Arm, Patient Position: Sitting, Cuff Size: Normal)   Pulse 68   Temp 98.3 F (36.8 C) (Oral)   Resp 12   Ht '5\' 3"'  (1.6 m)   Wt 130 lb 14.4 oz (59.4 kg)   SpO2 95%   BMI 23.19 kg/m   Body mass index is 23.19 kg/m.  Advanced Directives 10/07/2017 09/29/2016 09/28/2016 04/01/2016 09/30/2015 03/29/2015 09/26/2014  Does Patient Have a Medical Advance Directive? No No No No No No No  Would patient like information on creating a medical advance directive? Yes (MAU/Ambulatory/Procedural Areas - Information given) - No - Patient declined - No - patient declined information No - patient declined information No - patient declined information  Pre-existing out of facility DNR order (yellow form or pink MOST form) - - - - - - -    Tobacco Social History   Tobacco Use  Smoking Status Never Smoker  Smokeless Tobacco Never Used  Tobacco Comment   smoking cessation materials not required     Counseling given: No Comment: smoking cessation materials not required  Clinical Intake:  Pre-visit preparation completed: Yes  Pain : No/denies pain   BMI - recorded: 23.19 Nutritional Status: BMI of 19-24  Normal Nutritional Risks: None  Nutrition Risk Assessment: Has the patient had any N/V/D within the last 2 months?  No Does the patient have any non-healing wounds?  No Has the patient had any unintentional weight loss or weight gain?  No  Is the patient diabetic?  Yes If diabetic, was a CBG obtained today?  No Did the patient bring in their glucometer from home?  No Comments: Pt monitors CBG's 3 x's daily. Denies any financial strains with the device or  supplies.  Diabetic Exams: Diabetic Eye Exam: Completed 05/24/17.  Diabetic Foot Exam: Completed 09/29/16. Pt has been advised about the importance in completing this exam. Pt has been advised to schedule an appointment with Dr. SAncil Boozerfor completion of diabetic foot exam.  How often do you need to have someone help you when you read instructions, pamphlets, or other written materials from your doctor or pharmacy?: 1 - Never  Interpreter Needed?: No  Information entered by :: AIdell Pickles LPN  Past Medical History:  Diagnosis Date  . Dementia   . Diabetes mellitus without complication [place]   . Dysrhythmia   . Heart murmur   . Hypertension    Past Surgical History:  Procedure Laterality Date  . BREAST EXCISIONAL BIOPSY Left    neg  . BREAST SURGERY  1970  . EYE SURGERY     Family History  Problem Relation Age of Onset  . Alzheimer's disease Mother   . Diabetes Mother   . Arthritis Mother   . Diabetes Father   . Hypertension Father   . Hyperlipidemia Father   . Stroke Brother   . Multiple sclerosis Daughter    Social History   Socioeconomic History  . Marital status: Married    Spouse name: HMallie Mussel . Number of children: 3  . Years of education: Not on file  . Highest education level: 12th grade  Occupational History  . Occupation: Retired  SScientific laboratory technician . Financial resource strain: Not hard at all  .  Food insecurity:    Worry: Never true    Inability: Never true  . Transportation needs:    Medical: No    Non-medical: No  Tobacco Use  . Smoking status: Never Smoker  . Smokeless tobacco: Never Used  . Tobacco comment: smoking cessation materials not required  Substance and Sexual Activity  . Alcohol use: No    Alcohol/week: 0.0 oz  . Drug use: No  . Sexual activity: Not Currently  Lifestyle  . Physical activity:    Days per week: 0 days    Minutes per session: 0 min  . Stress: Not at all  Relationships  . Social connections:    Talks on phone: Patient  refused    Gets together: Patient refused    Attends religious service: Patient refused    Active member of club or organization: Patient refused    Attends meetings of clubs or organizations: Patient refused    Relationship status: Patient refused  Other Topics Concern  . Not on file  Social History Narrative  . Not on file    Outpatient Encounter Medications as of 10/07/2017  Medication Sig  . ACCU-CHEK AVIVA PLUS test strip USE 1 STRIP UP TO 4 TIMES DAILY  . alendronate (FOSAMAX) 70 MG tablet Take 1 tablet (70 mg total) by mouth once a week. Take with a full glass of water on an empty stomach. Sit up or stand for one hour after taking pill  . aspirin EC 81 MG tablet Take 81 mg by mouth daily.  Marland Kitchen atorvastatin (LIPITOR) 80 MG tablet Take 1 tablet (80 mg total) by mouth daily.  . blood glucose meter kit and supplies   . Calcium Carbonate-Vitamin D (CALCIUM-VITAMIN D) 600-125 MG-UNIT TABS Take 1 tablet by mouth daily.  . Cholecalciferol (VITAMIN D3) 2000 units capsule TAKE 1 TABLET (2,000 UNITS TOTAL) BY MOUTH DAILY.  Marland Kitchen Cholecalciferol (VITAMIN D3) 2000 units capsule Take 2,000 Units by mouth daily.  . [employer] ALCOHOL SWABS PADS USE 4 TIMES DAILY TO WIPE SKIN BEFORE USING INSULIN  . insulin aspart protamine- aspart (NOVOLOG MIX 70/30) (70-30) 100 UNIT/ML injection Inject 32 units before breakfast and 6 units before lunch and 12u before supper as directed.  . Olmesartan-Amlodipine-HCTZ 40-5-25 MG TABS Take 1 tablet by mouth daily.   No facility-administered encounter medications on file as of 10/07/2017.     Activities of Daily Living In your present state of health, do you have any difficulty performing the following activities: 10/07/2017  Hearing? N  Comment denies hearing aids  Vision? N  Comment wears eyeglasses  Difficulty concentrating or making decisions? N  Walking or climbing stairs? N  Dressing or bathing? N  Doing errands, shopping? Y  Comment husband transports  Health and safety inspector and eating ? N  Comment denies dentures  Using the Toilet? N  In the past six months, have you accidently leaked urine? N  Do you have problems with loss of bowel control? N  Managing your Medications? N  Managing your Finances? Y  Comment husband Engineer, mining? N  Some recent data might be hidden    Patient Care Team: Steele Sizer, MD as PCP - General (Family Medicine) Elease Etienne, MD as Consulting Physician (Internal Medicine) Eulogio Bear, MD as Consulting Physician (Ophthalmology)    Assessment:   This is a routine wellness examination for [place].  Exercise Activities and Dietary recommendations Current Exercise Habits: The patient does not participate in regular exercise  at present, Exercise limited by: None identified  Goals    . DIET - INCREASE WATER INTAKE     Recommend to drink at least 6-8 8oz glasses of water per day.    . Exercise 3x per week (30 min per time)     Recommend to exercise 3x per week (30 min per time)       Fall Risk Fall Risk  10/07/2017 09/28/2016 04/01/2016 03/29/2015 09/26/2014  Falls in the past year? No No No No No  Risk for fall due to : Impaired vision;History of fall(s);Medication side effect - - - -  Risk for fall due to: Comment wears eyeglasses - - - -   FALL RISK PREVENTION PERTAINING TO HOME: Is your home free of loose throw rugs in walkways, pet beds, electrical cords, etc? Yes Is there adequate lighting in your home to reduce risk of falls?  Yes Are there stairs in or around your home WITH handrails? Yes  ASSISTIVE DEVICES UTILIZED TO PREVENT FALLS: Use of a cane, walker or w/c? No Grab bars in the bathroom? No  Shower chair or a place to sit while bathing? No An elevated toilet seat or a handicapped toilet? Yes  Timed Get Up and Go Performed: Yes. Pt ambulated 10 feet within 20 sec. Gait slow, steady and without the use of an assistive device. No intervention required at this  time. Fall risk prevention has been discussed.  Community Resource Referral:  Pt declined my offer to send [employer] Referral to Care Guide for installation of grab bars in the shower or a shower chair.  Depression Screen PHQ 2/9 Scores 10/07/2017 09/28/2016 04/01/2016 03/29/2015  PHQ - 2 Score 0 0 0 0  PHQ- 9 Score 0 - - -     Cognitive Function     6CIT Screen 10/07/2017 09/28/2016  What Year? 4 points 0 points  What month? 3 points 0 points  What time? 0 points 3 points  Count back from 20 0 points 0 points  Months in reverse 0 points 0 points  Repeat phrase 6 points 10 points  Total Score 13 13    Immunization History  Administered Date(s) Administered  . Influenza, High Dose Seasonal PF 04/01/2016, 12/23/2016  . Influenza,inj,Quad PF,6+ Mos 01/01/2015  . Influenza-Unspecified 11/27/2013  . Pneumococcal Conjugate-13 03/29/2014  . Pneumococcal Polysaccharide-23 10/03/2009  . Tdap 10/29/2011    Qualifies for Shingles Vaccine? Yes. Due for Shingrix. Education has been provided regarding the importance of this vaccine. Pt has been advised to call insurance company to determine out of pocket expense. Advised may also receive vaccine at local pharmacy or Health Dept. Verbalized acceptance and understanding.  Screening Tests Health Maintenance  Topic Date Due  . HEMOGLOBIN A1C  04/01/2017  . MAMMOGRAM  05/20/2017  . FOOT EXAM  09/29/2017  . INFLUENZA VACCINE  10/07/2017  . OPHTHALMOLOGY EXAM  05/25/2018  . TETANUS/TDAP  10/28/2021  . COLONOSCOPY  08/12/2022  . DEXA SCAN  Completed  . PNA vac Low Risk Adult  Completed    Cancer Screenings: Lung: Low Dose CT Chest recommended if Age 52-80 years, 30 pack-year currently smoking OR have quit w/in 15years. Patient does not qualify. Breast:  Up to date on Mammogram? No. Completed 05/20/16. Repeat every year. Ordered today. Pt advised to schedule appt   Up to date of Bone Density/Dexa? Yes. Completed 11/18/16. Results  reflect osteopenia and osteoporosis. Repeat every 2 years Colorectal: Completed 08/11/12. Repeat every 10 years  Additional Screenings:  Hepatitis C Screening: Does not qualify    Plan:  I have personally reviewed and addressed the Medicare Annual Wellness questionnaire and have noted the following in the patient's chart:  A. Medical and social history B. Use of alcohol, tobacco or illicit drugs  C. Current medications and supplements D. Functional ability and status E.  Nutritional status F.  Physical activity G. Advance directives H. List of other physicians I.  Hospitalizations, surgeries, and ER visits in previous 12 months J.  [place] such as hearing and vision if needed, cognitive and depression L. Referrals and appointments  In addition, I have reviewed and discussed with patient certain preventive protocols, quality metrics, and best practice recommendations. A written personalized care plan for preventive services as well as general preventive health recommendations were provided to patient.  See attached scanned questionnaire for additional information.   Signed,  Aleatha Borer, LPN Nurse Health Advisor  I have reviewed this encounter including the documentation in this note and/or discussed this patient with the provider, Aleatha Borer, LPN. I am certifying that I agree with the content of this note as supervising physician.   Abnormal CIT but she has dementia  Steele Sizer, MD [facility] 10/08/2017, 10:48 AM

## 2017-10-12 ENCOUNTER — Encounter: Payer: Self-pay | Admitting: Family Medicine

## 2017-10-12 ENCOUNTER — Ambulatory Visit (INDEPENDENT_AMBULATORY_CARE_PROVIDER_SITE_OTHER): Payer: Medicare Other | Admitting: Family Medicine

## 2017-10-12 VITALS — BP 158/66 | HR 76 | Temp 98.0°F | Resp 14 | Ht 63.0 in | Wt 130.2 lb

## 2017-10-12 DIAGNOSIS — G3184 Mild cognitive impairment, so stated: Secondary | ICD-10-CM | POA: Diagnosis not present

## 2017-10-12 DIAGNOSIS — I700 Atherosclerosis of aorta: Secondary | ICD-10-CM | POA: Diagnosis not present

## 2017-10-12 DIAGNOSIS — E785 Hyperlipidemia, unspecified: Secondary | ICD-10-CM

## 2017-10-12 DIAGNOSIS — M810 Age-related osteoporosis without current pathological fracture: Secondary | ICD-10-CM | POA: Diagnosis not present

## 2017-10-12 DIAGNOSIS — I10 Essential (primary) hypertension: Secondary | ICD-10-CM

## 2017-10-12 DIAGNOSIS — E559 Vitamin D deficiency, unspecified: Secondary | ICD-10-CM

## 2017-10-12 DIAGNOSIS — R809 Proteinuria, unspecified: Secondary | ICD-10-CM | POA: Diagnosis not present

## 2017-10-12 DIAGNOSIS — E1029 Type 1 diabetes mellitus with other diabetic kidney complication: Secondary | ICD-10-CM

## 2017-10-12 MED ORDER — ATORVASTATIN CALCIUM 80 MG PO TABS: 80.0000 mg | ORAL_TABLET | Freq: Every day | ORAL | 5 refills | Status: DC

## 2017-10-12 MED ORDER — LOSARTAN POTASSIUM-HCTZ 100-12.5 MG PO TABS: 1.0000 | ORAL_TABLET | Freq: Every day | ORAL | 0 refills | Status: DC

## 2017-10-12 MED ORDER — OLMESARTAN-AMLODIPINE-HCTZ 40-5-25 MG PO TABS: 1.0000 | ORAL_TABLET | Freq: Every day | ORAL | 5 refills | Status: DC

## 2017-10-12 MED ORDER — ALENDRONATE SODIUM 70 MG PO TABS: 70.0000 mg | ORAL_TABLET | ORAL | 0 refills | Status: DC

## 2017-10-12 NOTE — Progress Notes (Signed)
Name: [name]   MRN: [id number]    DOB: [birth year]   Date:10/12/2017       Progress Note  Subjective  Chief Complaint  Chief Complaint  Patient presents with  . Medication Refill  . Hypertension  . Hyperlipidemia  . Mild Cognitive Impairment  . Osteoporsis    HPI   Osteoporosis: found on bone density 11/18/2016 , -2.4 left hip and -3.2 on L1-2 spine ( lower levels excluded because of DDD). She denies any pain, trying to eat a high calcium and high vitamin D diet, also taking otc calcium plus D, no history of fractures in the past. No family history of osteoporosis. She has been taking fosamax and is tolerating medication well.   HTN: she was on Tribenzor, but per husband ran out of medication months ago, she was seen last week and bp was at goal, today bp is elevated, we will resume medication but will change to losartan hctz and monitor to avoid hypotension and increase fall risk. Husband will bring her back next week for bp check only.  Denies chest pain or palpitation, no edema  Hyperlipidemia: she was taking high dose Lipitor but lost to follow up, we will recheck labs today She has atherosclerosis of Aorta - and is aware that taking statin, aspirin , and getting bp and DM under control is the way to manage   DMI: used to see  Dr. Eddie Dibbles, last hgbA1C was 8.8% 06/2017 She is on Novolog Mix 70/30 32 units, 6 units before lunch and 10 units at night, taking statin, aspirin and ARB also, she has history of proteinuria, but last urine micro was normal. . Fasting glucose is high in am's and normal to low at night. She will discuss with Endo during follow up Oct 2019   Mild Cognitive Impairment: she is still forgetful, but able to perform ADL without assistance, she is also able to do some instrumental activity of daily living, needs helps with finances and taking medication. She has been stableShe has seen Dr. 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 Symptoms are unchanged, failed CIT last week    Patient Active Problem List   Diagnosis Date Noted  . Atherosclerosis of aorta ([place]) 04/01/2016  . Aortic sclerosis 09/19/2014  . Benign hypertension 09/19/2014  . Calculus of gallbladder 09/19/2014  . Carpal tunnel syndrome 09/19/2014  . Detached retina 09/19/2014  . Type 1 diabetes mellitus with microalbuminuria ([place]) 09/19/2014  . Dyslipidemia 09/19/2014  . Hypophosphatemia 09/19/2014  . Mild cognitive impairment with memory loss 09/19/2014  . Microalbuminuria 09/19/2014  . OP (osteoporosis) 09/19/2014  . Menopause 09/19/2014  . Ptosis of eyelid 09/19/2014  . Hypoglycemia associated with diabetes ([place]) 10/24/2013  . Urge incontinence 08/01/2013  . Vitamin D deficiency 02/24/2007    Past Surgical History:  Procedure Laterality Date  . BREAST EXCISIONAL BIOPSY Left    neg  . BREAST SURGERY  1970  . EYE SURGERY      Family History  Problem Relation Age of Onset  . Alzheimer's disease Mother   . Diabetes Mother   . Arthritis Mother   . Diabetes Father   . Hypertension Father   . Hyperlipidemia Father   . Stroke Brother   . Multiple sclerosis Daughter     Social History   Socioeconomic History  . Marital status: Married    Spouse name: [name]  . Number of children: 3  . Years of  education: Not on file  . Highest education level: 12th grade  Occupational History  . Occupation: Retired  Scientific laboratory technician  . Financial resource strain: Not hard at all  . Food insecurity:    Worry: Never true    Inability: Never true  . Transportation needs:    Medical: No    Non-medical: No  Tobacco Use  . Smoking status: Never Smoker  . Smokeless tobacco: Never Used  . Tobacco comment: smoking cessation materials not required  Substance and Sexual Activity  . Alcohol use: No    Alcohol/week: 0.0 oz  . Drug use: No  . Sexual activity: Not Currently  Lifestyle  . Physical activity:    Days per week: 0  days    Minutes per session: 0 min  . Stress: Not at all  Relationships  . Social connections:    Talks on phone: More than three times a week    Gets together: Once a week    Attends religious service: More than 4 times per year    Active member of club or organization: Yes    Attends meetings of clubs or organizations: More than 4 times per year    Relationship status: Married  . Intimate partner violence:    Fear of current or ex partner: No    Emotionally abused: No    Physically abused: No    Forced sexual activity: No  Other Topics Concern  . Not on file  Social History Narrative  . Not on file     Current Outpatient Medications:  .  ACCU-CHEK AVIVA PLUS test strip, USE 1 STRIP UP TO 4 TIMES DAILY, Disp: 100 each, Rfl: 3 .  alendronate (FOSAMAX) 70 MG tablet, Take 1 tablet (70 mg total) by mouth once a week. Take with a full glass of water on an empty stomach., Disp: 4 tablet, Rfl: 0 .  aspirin EC 81 MG tablet, Take 81 mg by mouth daily., Disp: , Rfl:  .  atorvastatin (LIPITOR) 80 MG tablet, Take 1 tablet (80 mg total) by mouth daily., Disp: 30 tablet, Rfl: 5 .  blood glucose meter kit and supplies, , Disp: , Rfl:  .  Calcium Carbonate-Vitamin D (CALCIUM-VITAMIN D) 600-125 MG-UNIT TABS, Take 1 tablet by mouth daily., Disp: , Rfl:  .  Cholecalciferol (VITAMIN D3) 2000 units capsule, TAKE 1 TABLET (2,000 UNITS TOTAL) BY MOUTH DAILY., Disp: 100 capsule, Rfl: 2 .  CVS ALCOHOL SWABS PADS, USE 4 TIMES DAILY TO WIPE SKIN BEFORE USING INSULIN, Disp: 400 each, Rfl: 2 .  insulin aspart protamine- aspart (NOVOLOG MIX 70/30) (70-30) 100 UNIT/ML injection, Inject 32 units before breakfast and 6 units before lunch and 12u before supper as directed., Disp: , Rfl:  .  Olmesartan-amLODIPine-HCTZ 40-5-25 MG TABS, Take 1 tablet by mouth daily., Disp: 30 tablet, Rfl: 5  Allergies  Allergen Reactions  . Colesevelam Hcl     scotomas  . Daucus Carota   . Penicillins Itching      ROS  Constitutional: Negative for fever or weight change.  Respiratory: Negative for cough and shortness of breath.   Cardiovascular: Negative for chest pain or palpitations.  Gastrointestinal: Negative for abdominal pain, no bowel changes.  Musculoskeletal: Negative for gait problem or joint swelling.  Skin: Negative for rash.  Neurological: Negative for dizziness or headache.  No other specific complaints in a complete review of systems (except as listed in HPI above).  Objective  Vitals:   10/12/17 1312  BP: Marland Kitchen)  158/66  Pulse: 76  Resp: 14  Temp: 98 F (36.7 C)  TempSrc: Oral  SpO2: 97%  Weight: 130 lb 3.2 oz (59.1 kg)  Height: 5' 3" (1.6 m)    Body mass index is 23.06 kg/m.  Physical Exam Constitutional: Patient appears well-developed and well-nourished.  No distress.  HEENT: head atraumatic, normocephalic, pupils equal and reactive to light,neck supple, throat within normal limits Cardiovascular: Normal rate, regular rhythm and normal heart sounds.  3/6 systolic  murmur heard. 1 plus  BLE edema. Pulmonary/Chest: Effort normal and breath sounds normal. No respiratory distress. Abdominal: Soft.  There is no tenderness. Psychiatric: Patient has a normal mood and affect. b.ehavior is normal. Looks at her husband when not sure how to answer a question    Diabetic Foot Exam: Diabetic Foot Exam - Simple   Simple Foot Form Diabetic Foot exam was performed with the following findings:  Yes 10/12/2017  1:24 PM  Visual Inspection No deformities, no ulcerations, no other skin breakdown bilaterally:  Yes Sensation Testing Intact to touch and monofilament testing bilaterally:  Yes Pulse Check Posterior Tibialis and Dorsalis pulse intact bilaterally:  Yes Comments     PHQ2/9: Depression screen [facility] 2/9 10/12/2017 10/07/2017 09/28/2016 04/01/2016 03/29/2015  Decreased Interest 0 0 0 0 0  Down, Depressed, Hopeless 0 0 0 0 0  PHQ - 2 Score 0 0 0 0 0  Altered sleeping 0 0 - -  -  Tired, decreased energy 0 0 - - -  Change in appetite 0 0 - - -  Feeling bad or failure about yourself  0 0 - - -  Trouble concentrating 0 0 - - -  Moving slowly or fidgety/restless 0 0 - - -  Suicidal thoughts 0 0 - - -  PHQ-9 Score 0 0 - - -  Difficult doing work/chores - Not difficult at all - - -     Fall Risk: Fall Risk  10/12/2017 10/07/2017 09/28/2016 04/01/2016 03/29/2015  Falls in the past year? _0   Risk for fall due to : - Impaired vision;History of fall(s);Medication side effect - - -  Risk for fall due to: Comment - wears eyeglasses - - -     Functional Status Survey: Is the patient deaf or have difficulty hearing?: No Does the patient have difficulty seeing, even when wearing glasses/contacts?: No Does the patient have difficulty concentrating, remembering, or making decisions?: Yes Does the patient have difficulty walking or climbing stairs?: No Does the patient have difficulty dressing or bathing?: No Does the patient have difficulty doing errands alone such as visiting a doctor's office or shopping?: Yes    Assessment & Plan  1. Type 1 diabetes mellitus with microalbuminuria (HCC)  Used to see Dr. Eddie Dibbles, but will see NP on her next visit at Endocrinologist   2. Benign hypertension   BP was at goal last week, today it is elevated, but she has not been taking medications for months  - CBC with Differential/Platelet - COMPLETE METABOLIC PANEL WITH GFR Change from Tribenzor to losartan 100/12.5 mg daily   3. Atherosclerosis of aorta (HCC)  Continue statin therapy   4. Age-related osteoporosis without current pathological fracture  - alendronate (FOSAMAX) 70 MG tablet; Take 1 tablet (70 mg total) by mouth once a week. Take with a full glass of water on an empty stomach.  Dispense: 4 tablet; Refill: 0  5. Dyslipidemia  - atorvastatin (LIPITOR) 80 MG tablet; Take 1 tablet (80 mg  total) by mouth daily.  Dispense: 30 tablet; Refill: 5 - Lipid  panel  6. Vitamin D deficiency  Continue vitamin D otc  7. Mild cognitive impairment with memory loss  Seen by Dr. Manuella Ghazi, stable at this time

## 2017-10-13 LAB — COMPLETE METABOLIC PANEL WITH GFR
AG Ratio: 1.3 (calc) (ref 1.0–2.5)
ALBUMIN MSPROF: 4 g/dL (ref 3.6–5.1)
ALKALINE PHOSPHATASE (APISO): 67 U/L (ref 33–130)
ALT: 20 U/L (ref 6–29)
AST: 22 U/L (ref 10–35)
BILIRUBIN TOTAL: 0.4 mg/dL (ref 0.2–1.2)
BUN: 12 mg/dL (ref 7–25)
CHLORIDE: 106 mmol/L (ref 98–110)
CO2: 32 mmol/L (ref 20–32)
Calcium: 9.5 mg/dL (ref 8.6–10.4)
Creat: 0.72 mg/dL (ref 0.60–0.93)
GFR, EST AFRICAN AMERICAN: 96 mL/min/{1.73_m2} (ref >=60)
GFR, Est Non African American: 82 mL/min/{1.73_m2} (ref >=60)
GLOBULIN: 3.1 g/dL (ref 1.9–3.7)
Glucose, Bld: 206 mg/dL — ABNORMAL HIGH (ref 65–99)
Potassium: 4.4 mmol/L (ref 3.5–5.3)
SODIUM: 143 mmol/L (ref 135–146)
TOTAL PROTEIN: 7.1 g/dL (ref 6.1–8.1)

## 2017-10-13 LAB — CBC WITH DIFFERENTIAL/PLATELET
BASOS PCT: 0.8 %
Basophils Absolute: 42 cells/uL (ref 0–200)
EOS PCT: 0.8 %
Eosinophils Absolute: 42 cells/uL (ref 15–500)
HCT: 39.2 % (ref 35.0–45.0)
Hemoglobin: 13 g/dL (ref 11.7–15.5)
Lymphs Abs: 1763 cells/uL (ref 850–3900)
MCH: 29.5 pg (ref 27.0–33.0)
MCHC: 33.2 g/dL (ref 32.0–36.0)
MCV: 88.9 fL (ref 80.0–100.0)
MONOS PCT: 10.1 %
MPV: 11.3 fL (ref 7.5–12.5)
Neutro Abs: 2829 cells/uL (ref 1500–7800)
Neutrophils Relative %: 54.4 %
PLATELETS: 206 10*3/uL (ref 140–400)
RBC: 4.41 10*6/uL (ref 3.80–5.10)
RDW: 12.5 % (ref 11.0–15.0)
TOTAL LYMPHOCYTE: 33.9 %
WBC: 5.2 10*3/uL (ref 3.8–10.8)
WBCMIX: 525 {cells}/uL (ref 200–950)

## 2017-10-13 LAB — LIPID PANEL
CHOL/HDL RATIO: 3.8 (calc) (ref <5.0)
Cholesterol: 250 mg/dL — ABNORMAL HIGH (ref <200)
HDL: 66 mg/dL (ref >50)
LDL CHOLESTEROL (CALC): 167 mg/dL — AB
Non-HDL Cholesterol (Calc): 184 mg/dL (calc) — ABNORMAL HIGH (ref <130)
Triglycerides: 70 mg/dL (ref <150)

## 2017-11-03 ENCOUNTER — Other Ambulatory Visit: Payer: Self-pay | Admitting: Family Medicine

## 2017-11-03 DIAGNOSIS — M810 Age-related osteoporosis without current pathological fracture: Secondary | ICD-10-CM

## 2017-11-20 ENCOUNTER — Other Ambulatory Visit: Payer: Self-pay | Admitting: Family Medicine

## 2017-11-20 DIAGNOSIS — M810 Age-related osteoporosis without current pathological fracture: Secondary | ICD-10-CM

## 2017-11-22 MED ORDER — ALENDRONATE SODIUM 70 MG PO TABS: ORAL_TABLET | ORAL | 1 refills | Status: DC

## 2017-11-22 NOTE — Addendum Note (Signed)
Addended by: Steele Sizer F on: 11/22/2017 01:11 PM   Modules accepted: Orders

## 2017-11-22 NOTE — Addendum Note (Signed)
Addended by: Inda Coke on: 11/22/2017 08:33 AM   Modules accepted: Orders

## 2017-12-15 DIAGNOSIS — I10 Essential (primary) hypertension: Secondary | ICD-10-CM | POA: Diagnosis not present

## 2017-12-15 DIAGNOSIS — M810 Age-related osteoporosis without current pathological fracture: Secondary | ICD-10-CM | POA: Diagnosis not present

## 2017-12-15 DIAGNOSIS — E1159 Type 2 diabetes mellitus with other circulatory complications: Secondary | ICD-10-CM | POA: Diagnosis not present

## 2017-12-15 DIAGNOSIS — E1065 Type 1 diabetes mellitus with hyperglycemia: Secondary | ICD-10-CM | POA: Diagnosis not present

## 2017-12-15 DIAGNOSIS — E1069 Type 1 diabetes mellitus with other specified complication: Secondary | ICD-10-CM | POA: Diagnosis not present

## 2017-12-15 DIAGNOSIS — E11649 Type 2 diabetes mellitus with hypoglycemia without coma: Secondary | ICD-10-CM | POA: Diagnosis not present

## 2017-12-15 DIAGNOSIS — E785 Hyperlipidemia, unspecified: Secondary | ICD-10-CM | POA: Diagnosis not present

## 2017-12-15 LAB — BASIC METABOLIC PANEL: Glucose: 348

## 2017-12-15 LAB — LIPID PANEL
Cholesterol: 152 (ref 0–200)
HDL: 42 (ref 35–70)
LDL Cholesterol: 97
Triglycerides: 68 (ref 40–160)

## 2017-12-15 LAB — HEMOGLOBIN A1C: HEMOGLOBIN A1C: 9.9

## 2017-12-28 ENCOUNTER — Ambulatory Visit (INDEPENDENT_AMBULATORY_CARE_PROVIDER_SITE_OTHER): Payer: Medicare Other

## 2017-12-28 DIAGNOSIS — R809 Proteinuria, unspecified: Secondary | ICD-10-CM | POA: Diagnosis not present

## 2017-12-28 DIAGNOSIS — E785 Hyperlipidemia, unspecified: Secondary | ICD-10-CM | POA: Diagnosis not present

## 2017-12-28 DIAGNOSIS — Z23 Encounter for immunization: Secondary | ICD-10-CM

## 2017-12-28 DIAGNOSIS — N3944 Nocturnal enuresis: Secondary | ICD-10-CM | POA: Diagnosis not present

## 2017-12-28 DIAGNOSIS — I10 Essential (primary) hypertension: Secondary | ICD-10-CM | POA: Diagnosis not present

## 2017-12-28 DIAGNOSIS — G3184 Mild cognitive impairment, so stated: Secondary | ICD-10-CM | POA: Diagnosis not present

## 2017-12-28 DIAGNOSIS — E1029 Type 1 diabetes mellitus with other diabetic kidney complication: Secondary | ICD-10-CM | POA: Diagnosis not present

## 2017-12-28 DIAGNOSIS — I700 Atherosclerosis of aorta: Secondary | ICD-10-CM | POA: Diagnosis not present

## 2017-12-28 NOTE — Progress Notes (Signed)
Patient tolerated injection well. NKDA. 

## 2018-03-14 ENCOUNTER — Encounter: Payer: Self-pay | Admitting: Family Medicine

## 2018-03-14 ENCOUNTER — Ambulatory Visit (INDEPENDENT_AMBULATORY_CARE_PROVIDER_SITE_OTHER): Payer: Medicare Other | Admitting: Family Medicine

## 2018-03-14 VITALS — BP 156/86 | HR 82 | Temp 98.0°F | Resp 16 | Ht 63.0 in | Wt 128.7 lb

## 2018-03-14 DIAGNOSIS — E1029 Type 1 diabetes mellitus with other diabetic kidney complication: Secondary | ICD-10-CM

## 2018-03-14 DIAGNOSIS — I10 Essential (primary) hypertension: Secondary | ICD-10-CM | POA: Diagnosis not present

## 2018-03-14 DIAGNOSIS — R809 Proteinuria, unspecified: Secondary | ICD-10-CM | POA: Diagnosis not present

## 2018-03-14 DIAGNOSIS — R32 Unspecified urinary incontinence: Secondary | ICD-10-CM

## 2018-03-14 DIAGNOSIS — R8290 Unspecified abnormal findings in urine: Secondary | ICD-10-CM

## 2018-03-14 DIAGNOSIS — N3944 Nocturnal enuresis: Secondary | ICD-10-CM | POA: Diagnosis not present

## 2018-03-14 LAB — POCT URINALYSIS DIPSTICK
Bilirubin, UA: NEGATIVE
Glucose, UA: POSITIVE — AB
Ketones, UA: NEGATIVE
Nitrite, UA: NEGATIVE
Protein, UA: POSITIVE — AB
Spec Grav, UA: 1.015 (ref 1.010–1.025)
Urobilinogen, UA: 0.2 U/dL
pH, UA: 5 (ref 5.0–8.0)

## 2018-03-14 MED ORDER — SULFAMETHOXAZOLE-TRIMETHOPRIM 800-160 MG PO TABS: 1.0000 | ORAL_TABLET | Freq: Two times a day (BID) | ORAL | 0 refills | Status: DC

## 2018-03-14 MED ORDER — LOSARTAN POTASSIUM-HCTZ 100-12.5 MG PO TABS: 1.0000 | ORAL_TABLET | Freq: Every day | ORAL | 0 refills | Status: DC

## 2018-03-14 NOTE — Progress Notes (Signed)
Name: [name]   MRN: [id number]    DOB: 01/29/1941   Date:03/15/2018       Progress Note  Subjective  Chief Complaint  Chief Complaint  Patient presents with  . Urinary Incontinence    Over the Christmas holidays, had two accidents at her daugher's house and is going 2 to 3 times nightly to the bathroom.    HPI  Urinary Incontinence: she had one episode of nocturnal enuresis before Christmas and another episode during the day , she didn't realize she had voided, she has been wearing poise pads for a while, but symptoms are worse and urine has a strong  odor, no fever or chills, no change in appetite. Daughter states she seems a little more confused. She sees Dr. Honor Junes ( Endocrinologist) last hgbA1C was above 9% and she has not been compliant with diabetic diet lately. Drinking a lot of apple juice and snacks throughout the day. Glucose up to 405 a few days ago.   HTN: out of medication, bp is high, no chest pain or palpitation, resume medication   Patient Active Problem List   Diagnosis Date Noted  . Atherosclerosis of aorta ([place]) 04/01/2016  . Aortic sclerosis 09/19/2014  . Benign hypertension 09/19/2014  . Calculus of gallbladder 09/19/2014  . Carpal tunnel syndrome 09/19/2014  . Detached retina 09/19/2014  . Type 1 diabetes mellitus with microalbuminuria ([place]) 09/19/2014  . Dyslipidemia 09/19/2014  . Hypophosphatemia 09/19/2014  . Mild cognitive impairment with memory loss 09/19/2014  . Microalbuminuria 09/19/2014  . OP (osteoporosis) 09/19/2014  . Menopause 09/19/2014  . Ptosis of eyelid 09/19/2014  . Hypoglycemia associated with diabetes ([place]) 10/24/2013  . Urge incontinence 08/01/2013  . Hyperlipidemia due to type 1 diabetes mellitus ([place]) 08/01/2013  . Vitamin D deficiency 02/24/2007    Past Surgical History:  Procedure Laterality Date  . BREAST EXCISIONAL BIOPSY Left    neg  . BREAST SURGERY  1970  . EYE SURGERY      Family History  Problem Relation  Age of Onset  . Alzheimer's disease Mother   . Diabetes Mother   . Arthritis Mother   . Diabetes Father   . Hypertension Father   . Hyperlipidemia Father   . Stroke Brother   . Multiple sclerosis Daughter     Social History   Socioeconomic History  . Marital status: Married    Spouse name: [name]  . Number of children: 3  . Years of education: Not on file  . Highest education level: 12th grade  Occupational History  . Occupation: Retired  Scientific laboratory technician  . Financial resource strain: Not hard at all  . Food insecurity:    Worry: Never true    Inability: Never true  . Transportation needs:    Medical: No    Non-medical: No  Tobacco Use  . Smoking status: Never Smoker  . Smokeless tobacco: Never Used  . Tobacco comment: smoking cessation materials not required  Substance and Sexual Activity  . Alcohol use: No    Alcohol/week: 0.0 standard drinks  . Drug use: No  . Sexual activity: Not Currently  Lifestyle  . Physical activity:    Days per week: 0 days    Minutes per session: 0 min  . Stress: Not at all  Relationships  . Social connections:    Talks on phone: More than three times a week    Gets together: Once a week    Attends religious service: More than 4 times per year  Active member of club or organization: Yes    Attends meetings of clubs or organizations: More than 4 times per year    Relationship status: Married  . Intimate partner violence:    Fear of current or ex partner: No    Emotionally abused: No    Physically abused: No    Forced sexual activity: No  Other Topics Concern  . Not on file  Social History Narrative  . Not on file     Current Outpatient Medications:  .  ACCU-CHEK AVIVA PLUS test strip, USE 1 STRIP UP TO 4 TIMES DAILY, Disp: 100 each, Rfl: 3 .  aspirin EC 81 MG tablet, Take 81 mg by mouth daily., Disp: , Rfl:  .  atorvastatin (LIPITOR) 80 MG tablet, Take 1 tablet (80 mg total) by mouth daily., Disp: 30 tablet, Rfl: 5 .  blood  glucose meter kit and supplies, , Disp: , Rfl:  .  Calcium Carbonate-Vitamin D (CALCIUM-VITAMIN D) 600-125 MG-UNIT TABS, Take 1 tablet by mouth daily., Disp: , Rfl:  .  Cholecalciferol (VITAMIN D3) 2000 units capsule, TAKE 1 TABLET (2,000 UNITS TOTAL) BY MOUTH DAILY., Disp: 100 capsule, Rfl: 2 .  [employer] ALCOHOL SWABS PADS, USE 4 TIMES DAILY TO WIPE SKIN BEFORE USING INSULIN, Disp: 400 each, Rfl: 2 .  insulin aspart protamine- aspart (NOVOLOG MIX 70/30) (70-30) 100 UNIT/ML injection, Inject 32 units before breakfast and 6 units before lunch and 12u before supper as directed., Disp: , Rfl:  .  losartan-hydrochlorothiazide (HYZAAR) 100-12.5 MG tablet, Take 1 tablet by mouth daily., Disp: 90 tablet, Rfl: 0 .  alendronate (FOSAMAX) 70 MG tablet, TAKE 1 TABLET BY MOUTH ONCE A WEEK. TAKE WITH A FULL GLASS OF WATER ON AN EMPTY STOMACH. (Patient not taking: Reported on 03/14/2018), Disp: 12 tablet, Rfl: 1 .  sulfamethoxazole-trimethoprim (BACTRIM DS,SEPTRA DS) 800-160 MG tablet, Take 1 tablet by mouth 2 (two) times daily., Disp: 10 tablet, Rfl: 0  Allergies  Allergen Reactions  . Colesevelam Hcl     scotomas  . Daucus Carota   . Penicillins Itching    I personally reviewed active problem list, medication list, allergies, family history, social history with the patient/caregiver today.   ROS  Constitutional: Negative for fever or weight change.  Respiratory: Negative for cough and shortness of breath.   Cardiovascular: Negative for chest pain or palpitations.  Gastrointestinal: Negative for abdominal pain, no bowel changes.  Musculoskeletal: Positive  for gait problem - walks slowly, but no  joint swelling.  Skin: Negative for rash.  Neurological: Negative for dizziness or headache.  No other specific complaints in a complete review of systems (except as listed in HPI above).  Objective  Vitals:   03/14/18 1514 03/14/18 1612  BP: (!) 154/86 (!) 156/86  Pulse: 82   Resp: 16   Temp: 98 F (36.7  C)   TempSrc: Oral   SpO2: 98%   Weight: 128 lb 11.2 oz (58.4 kg)   Height: '5\' 3"'  (1.6 m)     Body mass index is 22.8 kg/m.  Physical Exam  Constitutional: Patient appears well-developed and  Frail No distress.  HEENT: head atraumatic, normocephalic, pupils equal and reactive to light, neck supple, throat within normal limits Cardiovascular: Normal rate, regular rhythm and normal heart sounds.  No murmur heard. No BLE edema. Pulmonary/Chest: Effort normal and breath sounds normal. No respiratory distress. Abdominal: Soft.  There is no tenderness. Negative CVA tenderness  Psychiatric: Patient has a normal mood and affect. Cooperative  Muscular Skeletal: slow gait, daughter assisted her while walking , she looks at husband and daughter for answers, baseline confusion to me  PHQ2/9: Depression screen [facility] 2/9 03/14/2018 10/12/2017 10/07/2017 09/28/2016 04/01/2016  Decreased Interest 0 0 0 0 0  Down, Depressed, Hopeless 0 0 0 0 0  PHQ - 2 Score 0 0 0 0 0  Altered sleeping - 0 0 - -  Tired, decreased energy - 0 0 - -  Change in appetite - 0 0 - -  Feeling bad or failure about yourself  - 0 0 - -  Trouble concentrating - 0 0 - -  Moving slowly or fidgety/restless - 0 0 - -  Suicidal thoughts - 0 0 - -  PHQ-9 Score - 0 0 - -  Difficult doing work/chores - - Not difficult at all - -    Fall Risk: Fall Risk  03/14/2018 10/12/2017 10/07/2017 09/28/2016 04/01/2016  Falls in the past year? 0 No No No No  Number falls in past yr: 0 - - - -  Injury with Fall? 0 - - - -  Risk for fall due to : - - Impaired vision;History of fall(s);Medication side effect - -  Risk for fall due to: Comment - - wears eyeglasses - -     Functional Status Survey: Is the patient deaf or have difficulty hearing?: No Does the patient have difficulty seeing, even when wearing glasses/contacts?: Yes(glasses) Does the patient have difficulty concentrating, remembering, or making decisions?: Yes(Memory Problems) Does the  patient have difficulty walking or climbing stairs?: No Does the patient have difficulty dressing or bathing?: No Does the patient have difficulty doing errands alone such as visiting a doctor's office or shopping?: Yes(Does not drive)    Assessment & Plan   1. Urinary incontinence, unspecified type  - POCT urinalysis dipstick - Urine Culture  - sulfamethoxazole-trimethoprim (BACTRIM DS,SEPTRA DS) 800-160 MG tablet; Take 1 tablet by mouth 2 (two) times daily.  Dispense: 10 tablet; Refill: 0   2. Type 1 diabetes mellitus with microalbuminuria (HCC)  - Urine Microalbumin w/creat. ratio  3. Enuresis, nocturnal and diurnal  Had two accidents since the holidays one at night and one du  4. Benign hypertension  bp is elevated, husband states she did not take medication today , we will send a refill  - losartan-hydrochlorothiazide (HYZAAR) 100-12.5 MG tablet; Take 1 tablet by mouth daily.  Dispense: 90 tablet; Refill: 0  5. Abnormal urine odor  - sulfamethoxazole-trimethoprim (BACTRIM DS,SEPTRA DS) 800-160 MG tablet; Take 1 tablet by mouth 2 (two) times daily.  Dispense: 10 tablet; Refill: 0

## 2018-03-15 LAB — MICROALBUMIN / CREATININE URINE RATIO
Creatinine, Urine: 176 mg/dL (ref 20–275)
Microalb Creat Ratio: 10 mcg/mg creat (ref <30)
Microalb, Ur: 1.8 mg/dL

## 2018-03-15 LAB — URINE CULTURE
MICRO NUMBER:: 17385
SPECIMEN QUALITY:: ADEQUATE

## 2018-04-15 ENCOUNTER — Ambulatory Visit (INDEPENDENT_AMBULATORY_CARE_PROVIDER_SITE_OTHER): Payer: Medicare Other | Admitting: Family Medicine

## 2018-04-15 ENCOUNTER — Encounter: Payer: Self-pay | Admitting: Family Medicine

## 2018-04-15 VITALS — BP 150/90 | HR 76 | Temp 98.0°F | Resp 16 | Ht 63.0 in | Wt 128.1 lb

## 2018-04-15 DIAGNOSIS — N3944 Nocturnal enuresis: Secondary | ICD-10-CM | POA: Diagnosis not present

## 2018-04-15 DIAGNOSIS — I700 Atherosclerosis of aorta: Secondary | ICD-10-CM | POA: Diagnosis not present

## 2018-04-15 DIAGNOSIS — E785 Hyperlipidemia, unspecified: Secondary | ICD-10-CM

## 2018-04-15 DIAGNOSIS — G3184 Mild cognitive impairment, so stated: Secondary | ICD-10-CM

## 2018-04-15 DIAGNOSIS — I10 Essential (primary) hypertension: Secondary | ICD-10-CM

## 2018-04-15 DIAGNOSIS — R809 Proteinuria, unspecified: Secondary | ICD-10-CM | POA: Diagnosis not present

## 2018-04-15 DIAGNOSIS — E1029 Type 1 diabetes mellitus with other diabetic kidney complication: Secondary | ICD-10-CM

## 2018-04-15 DIAGNOSIS — Z23 Encounter for immunization: Secondary | ICD-10-CM | POA: Diagnosis not present

## 2018-04-15 LAB — POCT GLYCOSYLATED HEMOGLOBIN (HGB A1C): HBA1C, POC (CONTROLLED DIABETIC RANGE): 8.5 % — AB (ref 0.0–7.0)

## 2018-04-15 MED ORDER — AMLODIPINE BESYLATE 2.5 MG PO TABS: 2.5000 mg | ORAL_TABLET | Freq: Every day | ORAL | 2 refills | Status: DC

## 2018-04-15 NOTE — Progress Notes (Signed)
Name: [name]   MRN: [id number]    DOB: Dec 10, 1940   Date:04/15/2018       Progress Note  Subjective  Chief Complaint  Chief Complaint  Patient presents with  . Medication Refill  . Hypertension    Denies any symptoms  . Diabetes    Checks three times daily pre-prandial Average-140 Highest-398 Lowest- 46  . Hyperlipidemia  . Mild Cognitive Impairment  . Osteoporosis    HPI  Osteoporosis: found on bone density 11/18/2016 , -2.4 left hip and -3.2 on L1-2 spine ( lower levels excluded because of DDD). She denies any pain, trying to eat a high calcium and high vitamin D diet, also taking otc calcium plus D, no history of fractures in the past. No family history of osteoporosis. She was taking fosamax but husband states no longer taking it . He thinks another provider told her to stop. She does not want to resume medications  HTN: she was on Tribenzor, but per husband ran out of medication and bp was at goal, we switched to losartan hctz only and bp is high again, we will add Norvasc 2.74m daily and monitor  Hyperlipidemia: she is tolerating atorvastatin, no myalgia.  She has atherosclerosis of Aorta - and is aware that taking statin, aspirin.  DMI: used to see  Dr. PEddie Dibblesnow under the care of Dr. OHonor Junes Husband forgot to remind CMA she gets A 1 C done at his office today her hgbA1C is better controlled, down to 8.5% . She is on Novolog Mix 70/30 32 units in am , 8 units before lunch and 10 units at night, taking statin, aspirin and ARB also, she has history of proteinuria, but last urine micro was normal. Fasting glucose 143-295, bedtime is dropping 46-90-75 past 3 nights. Recommend husband to contact Dr. OHonor Junesto adjust the pm dose. If unable to contact him advised to go down to 8 units at dinner   Mild Cognitive Impairment: she is still forgetful, but able to perform ADL without assistance, she is also able to dosomeinstrumental activity of daily living, needs helps with  finances and taking medication. She has been stableShe has seen Dr. SManuella Ghazi( neurologist in the past - and per husband she was diagnosed with microvascular dementia ). Symptoms are unchanged, failed CIT in the past, stable   Urinary incontinence: she was seen one month ago for two episodes of enuresis once at home and once at daughter's house, urine culture negative, but she was treated with antibiotics, no problems since. Discussed referral to urologist but husband wants to monitor for now   Patient Active Problem List   Diagnosis Date Noted  . Atherosclerosis of aorta ([place] 04/01/2016  . Aortic sclerosis 09/19/2014  . Benign hypertension 09/19/2014  . Calculus of gallbladder 09/19/2014  . Carpal tunnel syndrome 09/19/2014  . Detached retina 09/19/2014  . Type 1 diabetes mellitus with microalbuminuria ([place] 09/19/2014  . Dyslipidemia 09/19/2014  . Hypophosphatemia 09/19/2014  . Mild cognitive impairment with memory loss 09/19/2014  . Microalbuminuria 09/19/2014  . OP (osteoporosis) 09/19/2014  . Menopause 09/19/2014  . Ptosis of eyelid 09/19/2014  . Hypoglycemia associated with diabetes ([facility] 10/24/2013  . Urge incontinence 08/01/2013  . Hyperlipidemia due to type 1 diabetes mellitus ([place] 08/01/2013  . Vitamin D deficiency 02/24/2007    Past Surgical History:  Procedure Laterality Date  . BREAST EXCISIONAL BIOPSY Left    neg  . BREAST SURGERY  1970  . EYE SURGERY  Family History  Problem Relation Age of Onset  . Alzheimer's disease Mother   . Diabetes Mother   . Arthritis Mother   . Diabetes Father   . Hypertension Father   . Hyperlipidemia Father   . Stroke Brother   . Multiple sclerosis Daughter     Social History   Socioeconomic History  . Marital status: Married    Spouse name: [name]  . Number of children: 3  . Years of education: Not on file  . Highest education level: 12th grade  Occupational History  . Occupation: Retired  Scientific laboratory technician  .  Financial resource strain: Not hard at all  . Food insecurity:    Worry: Never true    Inability: Never true  . Transportation needs:    Medical: No    Non-medical: No  Tobacco Use  . Smoking status: Never Smoker  . Smokeless tobacco: Never Used  . Tobacco comment: smoking cessation materials not required  Substance and Sexual Activity  . Alcohol use: No    Alcohol/week: 0.0 standard drinks  . Drug use: No  . Sexual activity: Not Currently  Lifestyle  . Physical activity:    Days per week: 0 days    Minutes per session: 0 min  . Stress: Not at all  Relationships  . Social connections:    Talks on phone: More than three times a week    Gets together: Once a week    Attends religious service: More than 4 times per year    Active member of club or organization: Yes    Attends meetings of clubs or organizations: More than 4 times per year    Relationship status: Married  . Intimate partner violence:    Fear of current or ex partner: No    Emotionally abused: No    Physically abused: No    Forced sexual activity: No  Other Topics Concern  . Not on file  Social History Narrative  . Not on file     Current Outpatient Medications:  .  ACCU-CHEK AVIVA PLUS test strip, USE 1 STRIP UP TO 4 TIMES DAILY, Disp: 100 each, Rfl: 3 .  aspirin EC 81 MG tablet, Take 81 mg by mouth daily., Disp: , Rfl:  .  atorvastatin (LIPITOR) 80 MG tablet, Take 1 tablet (80 mg total) by mouth daily., Disp: 30 tablet, Rfl: 5 .  blood glucose meter kit and supplies, , Disp: , Rfl:  .  Calcium Carbonate-Vitamin D (CALCIUM-VITAMIN D) 600-125 MG-UNIT TABS, Take 1 tablet by mouth daily., Disp: , Rfl:  .  [employer] ALCOHOL SWABS PADS, USE 4 TIMES DAILY TO WIPE SKIN BEFORE USING INSULIN, Disp: 400 each, Rfl: 2 .  insulin aspart protamine- aspart (NOVOLOG MIX 70/30) (70-30) 100 UNIT/ML injection, Inject 32 units before breakfast and 6 units before lunch and 12u before supper as directed., Disp: , Rfl:  .   losartan-hydrochlorothiazide (HYZAAR) 100-12.5 MG tablet, Take 1 tablet by mouth daily., Disp: 90 tablet, Rfl: 0  Allergies  Allergen Reactions  . Colesevelam Hcl     scotomas  . Daucus Carota   . Penicillins Itching    I personally reviewed active problem list, medication list, allergies, family history, social history with the patient/caregiver today.   ROS  Constitutional: Negative for fever or weight change.  Respiratory: Negative for cough and shortness of breath.   Cardiovascular: Negative for chest pain or palpitations.  Gastrointestinal: Negative for abdominal pain, no bowel changes.  Musculoskeletal: Negative for gait  problem or joint swelling.  Skin: Negative for rash.  Neurological: Negative for dizziness or headache.  No other specific complaints in a complete review of systems (except as listed in HPI above).  Objective  Vitals:   04/15/18 1315  BP: (!) 150/90  Pulse: 76  Resp: 16  Temp: 98 F (36.7 C)  TempSrc: Oral  SpO2: 99%  Weight: 128 lb 1.6 oz (58.1 kg)  Height: _0  (1.6 m)    Body mass index is 22.69 kg/m.  Physical Exam  Constitutional: Patient appears well-developed and well-nourished.  No distress.  HEENT: head atraumatic, normocephalic, pupils equal and reactive to light,neck supple, throat within normal limits Cardiovascular: Normal rate, regular rhythm and normal heart sounds.  3/6  murmur heard. No BLE edema. Pulmonary/Chest: Effort normal and breath sounds normal. No respiratory distress. Abdominal: Soft.  There is no tenderness. Psychiatric: Patient cooperative, awake, looks at husband for reassurance when asked questions.  Recent Results (from the past 2160 hour(s))  Urine Microalbumin w/creat. ratio     Status: None   Collection Time: 03/14/18  4:53 PM  Result Value Ref Range   Creatinine, Urine 176 20 - 275 mg/dL   Microalb, Ur 1.8 mg/dL    Comment: Reference Range Not established    Microalb Creat Ratio 10 <30 mcg/mg  creat    Comment: . The ADA defines abnormalities in albumin excretion as follows: Marland Kitchen Category         Result (mcg/mg creatinine) . Normal                    <30 Microalbuminuria         30-299  Clinical albuminuria   > OR = 300 . The ADA recommends that at least two of three specimens collected within a 3-6 month period be abnormal before considering a patient to be within a diagnostic category.   Urine Culture     Status: None   Collection Time: 03/14/18  4:53 PM  Result Value Ref Range   MICRO NUMBER: [id number]    SPECIMEN QUALITY: Adequate    Sample Source URINE    STATUS: FINAL    ISOLATE 1:      Single organism less than 10,000 CFU/mL isolated. These organisms, commonly found on external and internal genitalia, are considered colonizers. No further testing performed.  POCT urinalysis dipstick     Status: Abnormal   Collection Time: 03/14/18  4:55 PM  Result Value Ref Range   Color, UA dark yellow    Clarity, UA cloudy    Glucose, UA Positive (A) Negative    Comment: 2000 or more   Bilirubin, UA neg    Ketones, UA neg    Spec Grav, UA 1.015 1.010 - 1.025   Blood, UA small    pH, UA 5.0 5.0 - 8.0   Protein, UA Positive (A) Negative    Comment: trace   Urobilinogen, UA 0.2 0.2 or 1.0 E.U./dL   Nitrite, UA neg    Leukocytes, UA Small (1+) (A) Negative   Appearance     Odor    POCT HgB A1C     Status: Abnormal   Collection Time: 04/15/18  1:22 PM  Result Value Ref Range   Hemoglobin A1C     HbA1c POC (<> result, manual entry)     HbA1c, POC (prediabetic range)     HbA1c, POC (controlled diabetic range) 8.5 (A) 0.0 - 7.0 %     PHQ2/9: Depression screen PHQ  2/9 03/14/2018 10/12/2017 10/07/2017 09/28/2016 04/01/2016  Decreased Interest 0 0 0 0 0  Down, Depressed, Hopeless 0 0 0 0 0  PHQ - 2 Score 0 0 0 0 0  Altered sleeping - 0 0 - -  Tired, decreased energy - 0 0 - -  Change in appetite - 0 0 - -  Feeling bad or failure about yourself  - 0 0 - -  Trouble  concentrating - 0 0 - -  Moving slowly or fidgety/restless - 0 0 - -  Suicidal thoughts - 0 0 - -  PHQ-9 Score - 0 0 - -  Difficult doing work/chores - - Not difficult at all - -     Fall Risk: Fall Risk  03/14/2018 10/12/2017 10/07/2017 09/28/2016 04/01/2016  Falls in the past year? 0 No No No No  Number falls in past yr: 0 - - - -  Injury with Fall? 0 - - - -  Risk for fall due to : - - Impaired vision;History of fall(s);Medication side effect - -  Risk for fall due to: Comment - - wears eyeglasses - -     Assessment & Plan  1. Type 1 diabetes mellitus with microalbuminuria (HCC)  - POCT HgB A1C Pm sugar is low, go down to 8 units of novolog pm dose if unable to get a hold of Dr. Manfred Shirts   2. Atherosclerosis of aorta (HCC)  On statin and aspirin   3. Benign hypertension  - amLODipine (NORVASC) 2.5 MG tablet; Take 1 tablet (2.5 mg total) by mouth daily.  Dispense: 30 tablet; Refill: 2  4. Enuresis, nocturnal and diurnal  Resolved   5. Mild cognitive impairment with memory loss  Unchanged   6. Dyslipidemia  Continue statin therapy

## 2018-04-28 DIAGNOSIS — E1065 Type 1 diabetes mellitus with hyperglycemia: Secondary | ICD-10-CM | POA: Diagnosis not present

## 2018-04-28 DIAGNOSIS — I10 Essential (primary) hypertension: Secondary | ICD-10-CM | POA: Diagnosis not present

## 2018-04-28 DIAGNOSIS — E1069 Type 1 diabetes mellitus with other specified complication: Secondary | ICD-10-CM | POA: Diagnosis not present

## 2018-04-28 DIAGNOSIS — E785 Hyperlipidemia, unspecified: Secondary | ICD-10-CM | POA: Diagnosis not present

## 2018-04-28 DIAGNOSIS — E1159 Type 2 diabetes mellitus with other circulatory complications: Secondary | ICD-10-CM | POA: Diagnosis not present

## 2018-06-06 ENCOUNTER — Other Ambulatory Visit: Payer: Self-pay | Admitting: Family Medicine

## 2018-06-06 DIAGNOSIS — I10 Essential (primary) hypertension: Secondary | ICD-10-CM

## 2018-06-18 ENCOUNTER — Other Ambulatory Visit: Payer: Self-pay | Admitting: Family Medicine

## 2018-06-18 DIAGNOSIS — E785 Hyperlipidemia, unspecified: Secondary | ICD-10-CM

## 2018-07-15 ENCOUNTER — Encounter: Payer: Self-pay | Admitting: Family Medicine

## 2018-07-15 ENCOUNTER — Ambulatory Visit (INDEPENDENT_AMBULATORY_CARE_PROVIDER_SITE_OTHER): Payer: Medicare Other | Admitting: Family Medicine

## 2018-07-15 ENCOUNTER — Other Ambulatory Visit: Payer: Self-pay

## 2018-07-15 DIAGNOSIS — E1029 Type 1 diabetes mellitus with other diabetic kidney complication: Secondary | ICD-10-CM

## 2018-07-15 DIAGNOSIS — I10 Essential (primary) hypertension: Secondary | ICD-10-CM

## 2018-07-15 DIAGNOSIS — I700 Atherosclerosis of aorta: Secondary | ICD-10-CM | POA: Diagnosis not present

## 2018-07-15 DIAGNOSIS — E559 Vitamin D deficiency, unspecified: Secondary | ICD-10-CM

## 2018-07-15 DIAGNOSIS — R809 Proteinuria, unspecified: Secondary | ICD-10-CM | POA: Diagnosis not present

## 2018-07-15 DIAGNOSIS — G3184 Mild cognitive impairment, so stated: Secondary | ICD-10-CM | POA: Diagnosis not present

## 2018-07-15 DIAGNOSIS — M810 Age-related osteoporosis without current pathological fracture: Secondary | ICD-10-CM

## 2018-07-15 DIAGNOSIS — E785 Hyperlipidemia, unspecified: Secondary | ICD-10-CM | POA: Diagnosis not present

## 2018-07-15 MED ORDER — ALENDRONATE SODIUM 70 MG PO TABS: 70.0000 mg | ORAL_TABLET | ORAL | 11 refills | Status: DC

## 2018-07-15 MED ORDER — LOSARTAN POTASSIUM-HCTZ 100-12.5 MG PO TABS: 1.0000 | ORAL_TABLET | Freq: Every day | ORAL | 3 refills | Status: DC

## 2018-07-15 MED ORDER — AMLODIPINE BESYLATE 2.5 MG PO TABS: 2.5000 mg | ORAL_TABLET | Freq: Every day | ORAL | 3 refills | Status: DC

## 2018-07-15 NOTE — Progress Notes (Signed)
Name: [name]   MRN: [id number]    DOB: [birth year]   Date:07/15/2018       Progress Note  Subjective  Chief Complaint  Chief Complaint  Patient presents with  . Medication Refill  . Diabetes    Dr. Honor Junes for all her DM medications-next appt in June 2020  . Hypertension    Denies any symptoms  . Hyperlipidemia  . Mild Cognitive Impairment  . Urinary Incontinence  . Osteoporosis    I connected with  [name] on 07/15/18 at  2:40 PM EDT by telephone and verified that I am speaking with the correct person using two identifiers.  I discussed the limitations, risks, security and privacy concerns of performing an evaluation and management service by telephone and the availability of in person appointments. Staff also discussed with the patient that there may be a patient responsible charge related to this service. Patient Location: at home Provider Location: [facility] Additional Individuals present: husband  HPI  Osteoporosis: found on bone density09/02/2017, -2.4 left hip and -3.2 on L1-2 spine ( lower levels excluded because of DDD). She denies any pain, trying to eat a high calcium and high vitamin D diet, also taking otc calcium plus D, no history of fractures in the past. No family history of osteoporosis.She was taking fosamax but husband states no longer taking it, because she ran out of refills, willing to resume it again.   HTN:she was on Tribenzor, but per husband ran out of medication and bp was at goal, we switched to losartan hctz only and bp is high again, she is now with norvasc 2.5 mg and is tolerating it well, unable to check bp at home, but she has a follow up with endo next month   Hyperlipidemia:she is tolerating atorvastatin, no myalgia. She has atherosclerosis of Aorta - and is aware that she is  taking statin, aspirin.  YKZ:LDJT to seeDr. Eddie Dibbles now under the care of Dr. Honor Junes. Husband forgot to remind CMA she gets A 1  C done at his office today her hgbA1C is better controlled, down to 8.5% . She is on Novolog Mix 70/30 30 units in am ,  10units at night, taking statin, aspirin and ARB also, she has history of proteinuria, but last urine micro was normal. Fasting glucose 95-200, usually mid 150. One time had a 300 levels, she goes to bed around 9 pm, and sleeps until 10-11 am, explained that she may be having low sugar during the night and needs to check her glucose during the night   Mild Cognitive Impairment: she is still forgetful, but able to perform ADL without assistance, she is also able to dosomeinstrumental activity of daily living, needs helps with finances and taking medication. She has been stableShe has seen Dr. Manuella Ghazi ( neurologist in the past - and per husband she was diagnosed with microvascular dementia ). Symptoms are unchanged, failed CIT in the past, not in the office   Urinary incontinence: she was seen one month ago for two episodes of enuresis once at home and once at daughter's house, urine culture negative, but she was treated with antibiotics, no problems since. Doing well, no bladder odor   Patient Active Problem List   Diagnosis Date Noted  . Atherosclerosis of aorta ([place]) 04/01/2016  . Aortic sclerosis 09/19/2014  . Benign hypertension 09/19/2014  . Calculus of gallbladder 09/19/2014  . Carpal tunnel syndrome 09/19/2014  . Detached retina 09/19/2014  . Type 1 diabetes mellitus  with microalbuminuria ([place]) 09/19/2014  . Dyslipidemia 09/19/2014  . Hypophosphatemia 09/19/2014  . Mild cognitive impairment with memory loss 09/19/2014  . Microalbuminuria 09/19/2014  . OP (osteoporosis) 09/19/2014  . Menopause 09/19/2014  . Ptosis of eyelid 09/19/2014  . Hypoglycemia associated with diabetes ([place]) 10/24/2013  . Urge incontinence 08/01/2013  . Hyperlipidemia due to type 1 diabetes mellitus ([place]) 08/01/2013  . Vitamin D deficiency 02/24/2007    Past Surgical History:   Procedure Laterality Date  . BREAST EXCISIONAL BIOPSY Left    neg  . BREAST SURGERY  1970  . EYE SURGERY      Family History  Problem Relation Age of Onset  . Alzheimer's disease Mother   . Diabetes Mother   . Arthritis Mother   . Diabetes Father   . Hypertension Father   . Hyperlipidemia Father   . Stroke Brother   . Multiple sclerosis Daughter     Social History   Socioeconomic History  . Marital status: Married    Spouse name: [name]  . Number of children: 3  . Years of education: Not on file  . Highest education level: 12th grade  Occupational History  . Occupation: Retired  Scientific laboratory technician  . Financial resource strain: Not hard at all  . Food insecurity:    Worry: Never true    Inability: Never true  . Transportation needs:    Medical: No    Non-medical: No  Tobacco Use  . Smoking status: Never Smoker  . Smokeless tobacco: Never Used  . Tobacco comment: smoking cessation materials not required  Substance and Sexual Activity  . Alcohol use: No    Alcohol/week: 0.0 standard drinks  . Drug use: No  . Sexual activity: Not Currently  Lifestyle  . Physical activity:    Days per week: 0 days    Minutes per session: 0 min  . Stress: Not at all  Relationships  . Social connections:    Talks on phone: More than three times a week    Gets together: Once a week    Attends religious service: More than 4 times per year    Active member of club or organization: Yes    Attends meetings of clubs or organizations: More than 4 times per year    Relationship status: Married  . Intimate partner violence:    Fear of current or ex partner: No    Emotionally abused: No    Physically abused: No    Forced sexual activity: No  Other Topics Concern  . Not on file  Social History Narrative  . Not on file     Current Outpatient Medications:  .  ACCU-CHEK AVIVA PLUS test strip, USE 1 STRIP UP TO 4 TIMES DAILY, Disp: 100 each, Rfl: 3 .  amLODipine (NORVASC) 2.5 MG tablet,  Take 1 tablet (2.5 mg total) by mouth daily., Disp: 30 tablet, Rfl: 2 .  aspirin EC 81 MG tablet, Take 81 mg by mouth daily., Disp: , Rfl:  .  atorvastatin (LIPITOR) 80 MG tablet, TAKE 1 TABLET BY MOUTH EVERY DAY, Disp: 90 tablet, Rfl: 0 .  blood glucose meter kit and supplies, , Disp: , Rfl:  .  Calcium Carbonate-Vitamin D (CALCIUM-VITAMIN D) 600-125 MG-UNIT TABS, Take 1 tablet by mouth daily., Disp: , Rfl:  .  [employer] ALCOHOL SWABS PADS, USE 4 TIMES DAILY TO WIPE SKIN BEFORE USING INSULIN, Disp: 400 each, Rfl: 2 .  glucose blood (ACCU-CHEK AVIVA) test strip, Use 3 (three) times daily  as instructed. E10.65, Disp: , Rfl:  .  insulin aspart protamine- aspart (NOVOLOG MIX 70/30) (70-30) 100 UNIT/ML injection, Inject 32 units before breakfast and 6 units before lunch and 12u before supper as directed., Disp: , Rfl:  .  losartan-hydrochlorothiazide (HYZAAR) 100-12.5 MG tablet, TAKE 1 TABLET BY MOUTH EVERY DAY, Disp: 90 tablet, Rfl: 0  Allergies  Allergen Reactions  . Colesevelam Hcl     scotomas  . Daucus [place]ota   . Penicillins Itching    I personally reviewed active problem list, medication list, allergies, family history, social history with the patient/caregiver today.   ROS  Ten systems reviewed and is negative except as mentioned in HPI   Objective  Virtual encounter, vitals not obtained.  There is no height or weight on file to calculate BMI.  Physical Exam  Awake, alert and cooperative, most of the talk was done by husband  PHQ2/9: Depression screen [facility] 2/9 07/15/2018 03/14/2018 10/12/2017 10/07/2017 09/28/2016  Decreased Interest 0 0 0 0 0  Down, Depressed, Hopeless 0 0 0 0 0  PHQ - 2 Score 0 0 0 0 0  Altered sleeping 0 - 0 0 -  Tired, decreased energy 0 - 0 0 -  Change in appetite 0 - 0 0 -  Feeling bad or failure about yourself  0 - 0 0 -  Trouble concentrating 0 - 0 0 -  Moving slowly or fidgety/restless 0 - 0 0 -  Suicidal thoughts 0 - 0 0 -  PHQ-9 Score 0 - 0 0 -   Difficult doing work/chores Not difficult at all - - Not difficult at all -   PHQ-2/9 Result is negative.    Fall Risk: Fall Risk  07/15/2018 03/14/2018 10/12/2017 10/07/2017 09/28/2016  Falls in the past year? 0 0 No No No  Number falls in past yr: 0 0 - - -  Injury with Fall? 0 0 - - -  Risk for fall due to : - - - Impaired vision;History of fall(s);Medication side effect -  Risk for fall due to: Comment - - - wears eyeglasses -     Assessment & Plan  1. Age-related osteoporosis without current pathological fracture  - alendronate (FOSAMAX) 70 MG tablet; Take 1 tablet (70 mg total) by mouth every 7 (seven) days. Take with a full glass of water on an empty stomach.  Dispense: 4 tablet; Refill: 11  2. Mild cognitive impairment with memory loss  stable  3. Atherosclerosis of aorta (HCC)  On statin and aspirin  4. Type 1 diabetes mellitus with microalbuminuria (HCC)  Keep follow up with Dr. Honor Junes  5. Dyslipidemia  On statin therapy   6. Vitamin D deficiency  Discussed supplementation   7. Benign hypertension  - amLODipine (NORVASC) 2.5 MG tablet; Take 1 tablet (2.5 mg total) by mouth daily.  Dispense: 30 tablet; Refill: 3 - losartan-hydrochlorothiazide (HYZAAR) 100-12.5 MG tablet; Take 1 tablet by mouth daily.  Dispense: 30 tablet; Refill: 3  I discussed the assessment and treatment plan with the patient. The patient was provided an opportunity to ask questions and all were answered. The patient agreed with the plan and demonstrated an understanding of the instructions.   The patient was advised to call back or seek an in-person evaluation if the symptoms worsen or if the condition fails to improve as anticipated.  I provided 25  minutes of non-face-to-face time during this encounter.  Loistine Chance, MD

## 2018-08-10 DIAGNOSIS — M810 Age-related osteoporosis without current pathological fracture: Secondary | ICD-10-CM | POA: Diagnosis not present

## 2018-08-10 DIAGNOSIS — E785 Hyperlipidemia, unspecified: Secondary | ICD-10-CM | POA: Diagnosis not present

## 2018-08-10 DIAGNOSIS — E1065 Type 1 diabetes mellitus with hyperglycemia: Secondary | ICD-10-CM | POA: Diagnosis not present

## 2018-08-10 DIAGNOSIS — I10 Essential (primary) hypertension: Secondary | ICD-10-CM | POA: Diagnosis not present

## 2018-08-10 DIAGNOSIS — E1069 Type 1 diabetes mellitus with other specified complication: Secondary | ICD-10-CM | POA: Diagnosis not present

## 2018-08-10 DIAGNOSIS — E1159 Type 2 diabetes mellitus with other circulatory complications: Secondary | ICD-10-CM | POA: Diagnosis not present

## 2018-10-14 ENCOUNTER — Ambulatory Visit (INDEPENDENT_AMBULATORY_CARE_PROVIDER_SITE_OTHER): Payer: Medicare Other

## 2018-10-14 DIAGNOSIS — Z0000 Encounter for general adult medical examination without abnormal findings: Secondary | ICD-10-CM

## 2018-10-14 NOTE — Patient Instructions (Addendum)
Ms. [name] , Thank you for taking time to come for your Medicare Wellness Visit. I appreciate your ongoing commitment to your health goals. Please review the following plan we discussed and let me know if I can assist you in the future.   Screening recommendations/referrals: Colonoscopy: done 08/11/12 Mammogram: done 05/20/16. Please discuss at your next visit with Dr. Ancil Boozer Bone Density: done 11/18/16 Recommended yearly ophthalmology/optometry visit for glaucoma screening and checkup Recommended yearly dental visit for hygiene and checkup  Vaccinations: Influenza vaccine: done 1022/19 Pneumococcal vaccine: done 03/29/14 Tdap vaccine: done 10/29/11 Shingles vaccine: Shingrix discussed. Please contact your pharmacy for coverage information.   Advanced directives: Advance directive discussed with you today. Even though you declined this today please call our office should you change your mind and we can give you the proper paperwork for you to fill out.  Conditions/risks identified: Recommend increasing physical activity to 3 days per week.   Next appointment: 11/15/18 Dr. Ancil Boozer 1:20. Please follow up in one year for your Medicare Annual Wellness visit.     Preventive Care 21 Years and Older, Female Preventive care refers to lifestyle choices and visits with your health care provider that can promote health and wellness. What does preventive care include?  A yearly physical exam. This is also called an annual well check.  Dental exams once or twice a year.  Routine eye exams. Ask your health care provider how often you should have your eyes checked.  Personal lifestyle choices, including:  Daily care of your teeth and gums.  Regular physical activity.  Eating a healthy diet.  Avoiding tobacco and drug use.  Limiting alcohol use.  Practicing safe sex.  Taking low-dose aspirin every day.  Taking vitamin and mineral supplements as recommended by your health care provider. What  happens during an annual well check? The services and screenings done by your health care provider during your annual well check will depend on your age, overall health, lifestyle risk factors, and family history of disease. Counseling  Your health care provider may ask you questions about your:  Alcohol use.  Tobacco use.  Drug use.  Emotional well-being.  Home and relationship well-being.  Sexual activity.  Eating habits.  History of falls.  Memory and ability to understand (cognition).  Work and work Statistician.  Reproductive health. Screening  You may have the following tests or measurements:  Height, weight, and BMI.  Blood pressure.  Lipid and cholesterol levels. These may be checked every 5 years, or more frequently if you are over 71 years old.  Skin check.  Lung cancer screening. You may have this screening every year starting at age 70 if you have a 30-pack-year history of smoking and currently smoke or have quit within the past 15 years.  Fecal occult blood test (FOBT) of the stool. You may have this test every year starting at age 45.  Flexible sigmoidoscopy or colonoscopy. You may have a sigmoidoscopy every 5 years or a colonoscopy every 10 years starting at age 38.  Hepatitis C blood test.  Hepatitis B blood test.  Sexually transmitted disease (STD) testing.  Diabetes screening. This is done by checking your blood sugar (glucose) after you have not eaten for a while (fasting). You may have this done every 1-3 years.  Bone density scan. This is done to screen for osteoporosis. You may have this done starting at age 45.  Mammogram. This may be done every 1-2 years. Talk to your health care provider about how  often you should have regular mammograms. Talk with your health care provider about your test results, treatment options, and if necessary, the need for more tests. Vaccines  Your health care provider may recommend certain vaccines, such as:   Influenza vaccine. This is recommended every year.  Tetanus, diphtheria, and acellular pertussis (Tdap, Td) vaccine. You may need a Td booster every 10 years.  Zoster vaccine. You may need this after age 50.  Pneumococcal 13-valent conjugate (PCV13) vaccine. One dose is recommended after age 43.  Pneumococcal polysaccharide (PPSV23) vaccine. One dose is recommended after age 24. Talk to your health care provider about which screenings and vaccines you need and how often you need them. This information is not intended to replace advice given to you by your health care provider. Make sure you discuss any questions you have with your health care provider. Document Released: 03/22/2015 Document Revised: 11/13/2015 Document Reviewed: 12/25/2014 Elsevier Interactive Patient Education  2017 [place] Prevention in the Home Falls can cause injuries. They can happen to people of all ages. There are many things you can do to make your home safe and to help prevent falls. What can I do on the outside of my home?  Regularly fix the edges of walkways and driveways and fix any cracks.  Remove anything that might make you trip as you walk through a door, such as a raised step or threshold.  Trim any bushes or trees on the path to your home.  Use bright outdoor lighting.  Clear any walking paths of anything that might make someone trip, such as rocks or tools.  Regularly check to see if handrails are loose or broken. Make sure that both sides of any steps have handrails.  Any raised decks and porches should have guardrails on the edges.  Have any leaves, snow, or ice cleared regularly.  Use sand or salt on walking paths during winter.  Clean up any spills in your garage right away. This includes oil or grease spills. What can I do in the bathroom?  Use night lights.  Install grab bars by the toilet and in the tub and shower. Do not use towel bars as grab bars.  Use non-skid mats  or decals in the tub or shower.  If you need to sit down in the shower, use a plastic, non-slip stool.  Keep the floor dry. Clean up any water that spills on the floor as soon as it happens.  Remove soap buildup in the tub or shower regularly.  Attach bath mats securely with double-sided non-slip rug tape.  Do not have throw rugs and other things on the floor that can make you trip. What can I do in the bedroom?  Use night lights.  Make sure that you have a light by your bed that is easy to reach.  Do not use any sheets or blankets that are too big for your bed. They should not hang down onto the floor.  Have a firm chair that has side arms. You can use this for support while you get dressed.  Do not have throw rugs and other things on the floor that can make you trip. What can I do in the kitchen?  Clean up any spills right away.  Avoid walking on wet floors.  Keep items that you use a lot in easy-to-reach places.  If you need to reach something above you, use a strong step stool that has a grab bar.  Keep electrical  cords out of the way.  Do not use floor polish or wax that makes floors slippery. If you must use wax, use non-skid floor wax.  Do not have throw rugs and other things on the floor that can make you trip. What can I do with my stairs?  Do not leave any items on the stairs.  Make sure that there are handrails on both sides of the stairs and use them. Fix handrails that are broken or loose. Make sure that handrails are as long as the stairways.  Check any carpeting to make sure that it is firmly attached to the stairs. Fix any carpet that is loose or worn.  Avoid having throw rugs at the top or bottom of the stairs. If you do have throw rugs, attach them to the floor with carpet tape.  Make sure that you have a light switch at the top of the stairs and the bottom of the stairs. If you do not have them, ask someone to add them for you. What else can I do to  help prevent falls?  Wear shoes that:  Do not have high heels.  Have rubber bottoms.  Are comfortable and fit you well.  Are closed at the toe. Do not wear sandals.  If you use a stepladder:  Make sure that it is fully opened. Do not climb a closed stepladder.  Make sure that both sides of the stepladder are locked into place.  Ask someone to hold it for you, if possible.  Clearly mark and make sure that you can see:  Any grab bars or handrails.  First and last steps.  Where the edge of each step is.  Use tools that help you move around (mobility aids) if they are needed. These include:  Canes.  Walkers.  Scooters.  Crutches.  Turn on the lights when you go into a dark area. Replace any light bulbs as soon as they burn out.  Set up your furniture so you have a clear path. Avoid moving your furniture around.  If any of your floors are uneven, fix them.  If there are any pets around you, be aware of where they are.  Review your medicines with your doctor. Some medicines can make you feel dizzy. This can increase your chance of falling. Ask your doctor what other things that you can do to help prevent falls. This information is not intended to replace advice given to you by your health care provider. Make sure you discuss any questions you have with your health care provider. Document Released: 12/20/2008 Document Revised: 08/01/2015 Document Reviewed: 03/30/2014 Elsevier Interactive Patient Education  2017 [employer].

## 2018-10-14 NOTE — Progress Notes (Signed)
Subjective:   [name] is a 78 y.o. female who presents for Medicare Annual (Subsequent) preventive examination.  Virtual Visit via Telephone Note  I connected with [name] on 10/14/18 at  9:40 AM EDT by telephone and verified that I am speaking with the correct person using two identifiers.  Medicare Annual Wellness visit completed telephonically due to Covid-19 pandemic.   Location: Patient: home  Provider: office   I discussed the limitations, risks, security and privacy concerns of performing an evaluation and management service by telephone and the availability of in person appointments. The patient expressed understanding and agreed to proceed.  Some vital signs may be absent or patient reported.   [name], LPN   Review of Systems:   Cardiac Risk Factors include: advanced age (>73mn, >>34women);diabetes mellitus;hypertension;dyslipidemia     Objective:     Vitals: There were no vitals taken for this visit.  There is no height or weight on file to calculate BMI.  Advanced Directives 10/14/2018 10/07/2017 09/29/2016 09/28/2016 04/01/2016 09/30/2015 03/29/2015  Does Patient Have a Medical Advance Directive? No No No No No No No  Would patient like information on creating a medical advance directive? No - Patient declined Yes (MAU/Ambulatory/Procedural Areas - Information given) - No - Patient declined - No - patient declined information No - patient declined information  Pre-existing out of facility DNR order (yellow form or pink MOST form) - - - - - - -    Tobacco Social History   Tobacco Use  Smoking Status Never Smoker  Smokeless Tobacco Never Used  Tobacco Comment   smoking cessation materials not required     Counseling given: Not Answered Comment: smoking cessation materials not required   Clinical Intake:  Pre-visit preparation completed: Yes  Pain : No/denies pain     Nutritional Status: BMI of 19-24  Normal Nutritional Risks: None  Diabetes: Yes CBG done?: No Did pt. bring in CBG monitor from home?: No   Nutrition Risk Assessment:  Has the patient had any N/V/D within the last 2 months?  No  Does the patient have any non-healing wounds?  No  Has the patient had any unintentional weight loss or weight gain?  No   Diabetes:  Is the patient diabetic?  Yes  If diabetic, was a CBG obtained today?  No  Did the patient bring in their glucometer from home?  No  How often do you monitor your CBG's? Daily fasting AM today 184.   Financial Strains and Diabetes Management:  Are you having any financial strains with the device, your supplies or your medication? No .  Does the patient want to be seen by Chronic Care Management for management of their diabetes?  No  Would the patient like to be referred to a Nutritionist or for Diabetic Management?  No   Diabetic Exams:  Diabetic Eye Exam: Completed 05/27/17. Overdue for diabetic eye exam. Pt has been advised about the importance in completing this exam. Appt postponed this year due to Covid-19.  Diabetic Foot Exam: Completed 10/12/17. Pt has been advised about the importance in completing this exam. Pt is scheduled for diabetic foot exam on 11/15/18.   How often do you need to have someone help you when you read instructions, pamphlets, or other written materials from your doctor or pharmacy?: 1 - Never  Interpreter Needed?: No  Information entered by :: KClemetine MarkerLPN  Past Medical History:  Diagnosis Date  . Dementia ([place]   .  Diabetes mellitus without complication ([place])   . Dysrhythmia   . Heart murmur   . Hypertension    Past Surgical History:  Procedure Laterality Date  . BREAST EXCISIONAL BIOPSY Left    neg  . BREAST SURGERY  1970  . EYE SURGERY     Family History  Problem Relation Age of Onset  . Alzheimer's disease Mother   . Diabetes Mother   . Arthritis Mother   . Diabetes Father   . Hypertension Father   . Hyperlipidemia Father   . Stroke  Brother   . Multiple sclerosis Daughter    Social History   Socioeconomic History  . Marital status: Married    Spouse name: [name]  . Number of children: 3  . Years of education: Not on file  . Highest education level: 12th grade  Occupational History  . Occupation: Retired  Scientific laboratory technician  . Financial resource strain: Not hard at all  . Food insecurity    Worry: Never true    Inability: Never true  . Transportation needs    Medical: No    Non-medical: No  Tobacco Use  . Smoking status: Never Smoker  . Smokeless tobacco: Never Used  . Tobacco comment: smoking cessation materials not required  Substance and Sexual Activity  . Alcohol use: No    Alcohol/week: 0.0 standard drinks  . Drug use: No  . Sexual activity: Not Currently  Lifestyle  . Physical activity    Days per week: 0 days    Minutes per session: 0 min  . Stress: Not at all  Relationships  . Social connections    Talks on phone: More than three times a week    Gets together: Once a week    Attends religious service: More than 4 times per year    Active member of club or organization: Yes    Attends meetings of clubs or organizations: More than 4 times per year    Relationship status: Married  Other Topics Concern  . Not on file  Social History Narrative  . Not on file    Outpatient Encounter Medications as of 10/14/2018  Medication Sig  . ACCU-CHEK AVIVA PLUS test strip USE 1 STRIP UP TO 4 TIMES DAILY  . alendronate (FOSAMAX) 70 MG tablet Take 1 tablet (70 mg total) by mouth every 7 (seven) days. Take with a full glass of water on an empty stomach.  Marland Kitchen amLODipine (NORVASC) 2.5 MG tablet Take 1 tablet (2.5 mg total) by mouth daily.  Marland Kitchen aspirin EC 81 MG tablet Take 81 mg by mouth daily.  Marland Kitchen atorvastatin (LIPITOR) 80 MG tablet TAKE 1 TABLET BY MOUTH EVERY DAY  . blood glucose meter kit and supplies   . Calcium Carbonate-Vitamin D (CALCIUM-VITAMIN D) 600-125 MG-UNIT TABS Take 1 tablet by mouth daily.  . [facility]  ALCOHOL SWABS PADS USE 4 TIMES DAILY TO WIPE SKIN BEFORE USING INSULIN  . insulin aspart protamine- aspart (NOVOLOG MIX 70/30) (70-30) 100 UNIT/ML injection Pt taking 40 units in AM and 10 units before bed only per Dr. Paulla Fore  . losartan-hydrochlorothiazide (HYZAAR) 100-12.5 MG tablet Take 1 tablet by mouth daily.  . [DISCONTINUED] glucose blood (ACCU-CHEK AVIVA) test strip Use 3 (three) times daily as instructed. E10.65   No facility-administered encounter medications on file as of 10/14/2018.     Activities of Daily Living In your present state of health, do you have any difficulty performing the following activities: 10/14/2018 07/15/2018  Hearing? N N  Comment declines hearing aids -  Vision? N Y  Comment - -  Difficulty concentrating or making decisions? Y Y  Comment - -  Walking or climbing stairs? N N  Dressing or bathing? N N  Doing errands, shopping? Y Y  Comment no longer drives -  Conservation officer, nature and eating ? N -  Using the Toilet? N -  In the past six months, have you accidently leaked urine? N -  Do you have problems with loss of bowel control? N -  Managing your Medications? Y -  Comment managed by husband -  Managing your Finances? Y -  Comment managed by husband -  Housekeeping or managing your Housekeeping? N -  Some recent data might be hidden    Patient Care Team: Steele Sizer, MD as PCP - General (Family Medicine) Eulogio Bear, MD as Consulting Physician (Ophthalmology) Lonia Farber, MD as Consulting Physician (Internal Medicine)    Assessment:   This is a routine wellness examination for [name].  Exercise Activities and Dietary recommendations Current Exercise Habits: The patient does not participate in regular exercise at present, Exercise limited by: None identified  Goals    . DIET - INCREASE WATER INTAKE     Recommend to drink at least 6-8 8oz glasses of water per day.    . Exercise 3x per week (30 min per time)     Recommend to  exercise 3x per week (30 min per time)       Fall Risk Fall Risk  10/14/2018 07/15/2018 03/14/2018 10/12/2017 10/07/2017  Falls in the past year? 0 0 0 No No  Number falls in past yr: 0 0 0 - -  Injury with Fall? 0 0 0 - -  Risk for fall due to : - - - - Impaired vision;History of fall(s);Medication side effect  Risk for fall due to: Comment - - - - wears eyeglasses  Follow up Falls prevention discussed - - - -   FALL RISK PREVENTION PERTAINING TO THE HOME:  Any stairs in or around the home? Yes  If so, do they handrails? Yes   Home free of loose throw rugs in walkways, pet beds, electrical cords, etc? Yes  Adequate lighting in your home to reduce risk of falls? Yes   ASSISTIVE DEVICES UTILIZED TO PREVENT FALLS:  Life alert? No  Use of a cane, walker or w/c? No  Grab bars in the bathroom? No  Shower chair or bench in shower? No  Elevated toilet seat or a handicapped toilet? No   DME ORDERS:  DME order needed?  No   TIMED UP AND GO:  Was the test performed? No . Telephonic visit.   Education: Fall risk prevention has been discussed.  Intervention(s) required? No   Depression Screen PHQ 2/9 Scores 10/14/2018 07/15/2018 03/14/2018 10/12/2017  PHQ - 2 Score 0 0 0 0  PHQ- 9 Score - 0 - 0     Cognitive Function - pt declined 6CIT for 2020 AWV     6CIT Screen 10/07/2017 09/28/2016  What Year? 4 points 0 points  What month? 3 points 0 points  What time? 0 points 3 points  Count back from 20 0 points 0 points  Months in reverse 0 points 0 points  Repeat phrase 6 points 10 points  Total Score 13 13    Immunization History  Administered Date(s) Administered  . Influenza, High Dose Seasonal PF 04/01/2016, 12/23/2016, 12/28/2017  . Influenza,inj,Quad PF,6+ Mos 01/01/2015  .  Influenza-Unspecified 11/27/2013  . Pneumococcal Conjugate-13 03/29/2014  . Pneumococcal Polysaccharide-23 10/03/2009  . Tdap 10/29/2011    Qualifies for Shingles Vaccine? Yes . Due for Shingrix. Education has  been provided regarding the importance of this vaccine. Pt has been advised to call insurance company to determine out of pocket expense. Advised may also receive vaccine at local pharmacy or Health Dept. Verbalized acceptance and understanding. Pt declines shingles vaccine.   Tdap: Up to date  Flu Vaccine: Up to date  Pneumococcal Vaccine: Up to date   Screening Tests Health Maintenance  Topic Date Due  . MAMMOGRAM  05/20/2017  . OPHTHALMOLOGY EXAM  05/25/2018  . INFLUENZA VACCINE  10/08/2018  . FOOT EXAM  10/13/2018  . HEMOGLOBIN A1C  10/14/2018  . TETANUS/TDAP  10/28/2021  . COLONOSCOPY  08/12/2022  . DEXA SCAN  Completed  . PNA vac Low Risk Adult  Completed   Cancer Screenings:  Colorectal Screening: Completed 08/11/12. Repeat every 10 years  Mammogram: Completed 05/20/16. Repeat every year.  Bone Density: Completed 11/18/16. Results reflect OSTEOPOROSIS. Repeat every 2 years.   Lung Cancer Screening: (Low Dose CT Chest recommended if Age 34-80 years, 30 pack-year currently smoking OR have quit w/in 15years.) does not qualify.   Additional Screening:   Hepatitis C Screening: no longer required  Vision Screening: Recommended annual ophthalmology exams for early detection of glaucoma and other disorders of the eye. Is the patient up to date with their annual eye exam?  Yes  Who is the provider or what is the name of the office in which the pt attends annual eye exams? Dr. [facility]  Dental Screening: Recommended annual dental exams for proper oral hygiene  Community Resource Referral:  CRR required this visit?  No      Plan:     I have personally reviewed and addressed the Medicare Annual Wellness questionnaire and have noted the following in the patient's chart:  A. Medical and social history B. Use of alcohol, tobacco or illicit drugs  C. Current medications and supplements D. Functional ability and status E.  Nutritional status F.  Physical  activity G. Advance directives H. List of other physicians I.  Hospitalizations, surgeries, and ER visits in previous 12 months J.  [place] such as hearing and vision if needed, cognitive and depression L. Referrals and appointments   In addition, I have reviewed and discussed with patient certain preventive protocols, quality metrics, and best practice recommendations. A written personalized care plan for preventive services as well as general preventive health recommendations were provided to patient.   Signed,  [name], LPN Nurse Health Advisor   Nurse Notes: pt doing well and appreciative of visit today. Accompanied on telephone call by her husband.

## 2018-11-15 ENCOUNTER — Ambulatory Visit (INDEPENDENT_AMBULATORY_CARE_PROVIDER_SITE_OTHER): Payer: Medicare Other | Admitting: Family Medicine

## 2018-11-15 ENCOUNTER — Encounter: Payer: Self-pay | Admitting: Family Medicine

## 2018-11-15 ENCOUNTER — Other Ambulatory Visit: Payer: Self-pay

## 2018-11-15 VITALS — BP 140/70 | HR 70 | Temp 97.1°F | Resp 16 | Ht 63.0 in | Wt 132.2 lb

## 2018-11-15 DIAGNOSIS — E1159 Type 2 diabetes mellitus with other circulatory complications: Secondary | ICD-10-CM

## 2018-11-15 DIAGNOSIS — E1029 Type 1 diabetes mellitus with other diabetic kidney complication: Secondary | ICD-10-CM | POA: Diagnosis not present

## 2018-11-15 DIAGNOSIS — Z23 Encounter for immunization: Secondary | ICD-10-CM | POA: Diagnosis not present

## 2018-11-15 DIAGNOSIS — G3184 Mild cognitive impairment, so stated: Secondary | ICD-10-CM

## 2018-11-15 DIAGNOSIS — R809 Proteinuria, unspecified: Secondary | ICD-10-CM

## 2018-11-15 DIAGNOSIS — I700 Atherosclerosis of aorta: Secondary | ICD-10-CM | POA: Diagnosis not present

## 2018-11-15 DIAGNOSIS — I10 Essential (primary) hypertension: Secondary | ICD-10-CM

## 2018-11-15 DIAGNOSIS — E559 Vitamin D deficiency, unspecified: Secondary | ICD-10-CM | POA: Diagnosis not present

## 2018-11-15 DIAGNOSIS — E785 Hyperlipidemia, unspecified: Secondary | ICD-10-CM

## 2018-11-15 DIAGNOSIS — M810 Age-related osteoporosis without current pathological fracture: Secondary | ICD-10-CM | POA: Diagnosis not present

## 2018-11-15 LAB — POCT GLYCOSYLATED HEMOGLOBIN (HGB A1C): Hemoglobin A1C: 7.6 % — AB (ref 4.0–5.6)

## 2018-11-15 MED ORDER — ATORVASTATIN CALCIUM 80 MG PO TABS: 80.0000 mg | ORAL_TABLET | Freq: Every day | ORAL | 3 refills | Status: DC

## 2018-11-15 MED ORDER — LOSARTAN POTASSIUM-HCTZ 100-12.5 MG PO TABS: 1.0000 | ORAL_TABLET | Freq: Every day | ORAL | 3 refills | Status: DC

## 2018-11-15 MED ORDER — AMLODIPINE BESYLATE 2.5 MG PO TABS: 2.5000 mg | ORAL_TABLET | Freq: Every day | ORAL | 3 refills | Status: DC

## 2018-11-15 NOTE — Progress Notes (Signed)
Name: [name]   MRN: [id number]    DOB: 03/23/1940   Date:11/15/2018       Progress Note  Subjective  Chief Complaint  Chief Complaint  Patient presents with  . Hypertension  . Diabetes  . Hyperlipidemia    HPI  Osteoporosis: found on bone density09/02/2017, -2.4 left hip and -3.2 on L1-2 spine ( lower levels excluded because of DDD). She denies any pain, trying to eat a high calcium and high vitamin D diet, also taking otc calcium plus D, no history of fractures in the past. No family history of osteoporosis.Sheis back on Alendronate once a week and denies side effects  HTN:she is currently on Losartan hctz and norvasc 2.5 mg daily, bp at Dr. O'Connel was 137/70 a few months ago, today it is borderline, but we will continue current dose to keep bp below 140/90  Hyperlipidemia:sheis tolerating atorvastatin, no myalgia.She has atherosclerosis of Aorta - unchanged   DMI:used to seeDr. Paulnow under the care of Dr. O'Connell. Husband forgot to remind CMA she gets A 1 C done at his office today is well controlled at 7.6% at Dr. O'Connell three months ago was 7.5% She is on Novolog Mix 70/30 30 units in am,10units at night, taking statin, aspirin and ARB also, she has history of proteinuria, but last urine micro was normal. Fasting glucose still all over the place, fasting 113-300, most of the time fasting is 200 fasting - husband states he forgets to give her the shot before dinner   Mild Cognitive Impairment: she is still forgetful, but able to perform ADL without assistance, she is also able to dosomeinstrumental activity of daily living, needs helps with finances and taking medication. She has been stableShe has seen Dr. Shah ( neurologist in the past - and per husband she was diagnosed with microvascular dementia ). Husband states symptoms have been unchanged, it comes and goes.    Patient Active Problem List   Diagnosis Date Noted  . Type 1 diabetes  mellitus with hyperglycemia, with long-term current use of insulin (HCC) 04/28/2018  . Atherosclerosis of aorta (HCC) 04/01/2016  . Aortic sclerosis 09/19/2014  . Benign hypertension 09/19/2014  . Calculus of gallbladder 09/19/2014  . Carpal tunnel syndrome 09/19/2014  . Detached retina 09/19/2014  . Type 1 diabetes mellitus with microalbuminuria (HCC) 09/19/2014  . Dyslipidemia 09/19/2014  . Hypophosphatemia 09/19/2014  . Mild cognitive impairment with memory loss 09/19/2014  . Microalbuminuria 09/19/2014  . OP (osteoporosis) 09/19/2014  . Menopause 09/19/2014  . Ptosis of eyelid 09/19/2014  . Hypoglycemia associated with diabetes (HCC) 10/24/2013  . Urge incontinence 08/01/2013  . Hyperlipidemia due to type 1 diabetes mellitus (HCC) 08/01/2013  . Vitamin D deficiency 02/24/2007    Past Surgical History:  Procedure Laterality Date  . BREAST EXCISIONAL BIOPSY Left    neg  . BREAST SURGERY  1970  . EYE SURGERY      Family History  Problem Relation Age of Onset  . Alzheimer's disease Mother   . Diabetes Mother   . Arthritis Mother   . Diabetes Father   . Hypertension Father   . Hyperlipidemia Father   . Stroke Brother   . Multiple sclerosis Daughter     Social History   Socioeconomic History  . Marital status: Married    Spouse name: Henry  . Number of children: 3  . Years of education: Not on file  . Highest education level: 12th grade  Occupational History  . Occupation: Retired  Social   Needs  . Financial resource strain: Not hard at all  . Food insecurity    Worry: Never true    Inability: Never true  . Transportation needs    Medical: No    Non-medical: No  Tobacco Use  . Smoking status: Never Smoker  . Smokeless tobacco: Never Used  . Tobacco comment: smoking cessation materials not required  Substance and Sexual Activity  . Alcohol use: No    Alcohol/week: 0.0 standard drinks  . Drug use: No  . Sexual activity: Not Currently  Lifestyle  .  Physical activity    Days per week: 0 days    Minutes per session: 0 min  . Stress: Not at all  Relationships  . Social connections    Talks on phone: More than three times a week    Gets together: Once a week    Attends religious service: More than 4 times per year    Active member of club or organization: Yes    Attends meetings of clubs or organizations: More than 4 times per year    Relationship status: Married  . Intimate partner violence    Fear of current or ex partner: No    Emotionally abused: No    Physically abused: No    Forced sexual activity: No  Other Topics Concern  . Not on file  Social History Narrative  . Not on file     Current Outpatient Medications:  .  ACCU-CHEK AVIVA PLUS test strip, USE 1 STRIP UP TO 4 TIMES DAILY, Disp: 100 each, Rfl: 3 .  alendronate (FOSAMAX) 70 MG tablet, Take 1 tablet (70 mg total) by mouth every 7 (seven) days. Take with a full glass of water on an empty stomach., Disp: 4 tablet, Rfl: 11 .  amLODipine (NORVASC) 2.5 MG tablet, Take 1 tablet (2.5 mg total) by mouth daily., Disp: 30 tablet, Rfl: 3 .  aspirin EC 81 MG tablet, Take 81 mg by mouth daily., Disp: , Rfl:  .  atorvastatin (LIPITOR) 80 MG tablet, Take 1 tablet (80 mg total) by mouth daily., Disp: 30 tablet, Rfl: 3 .  blood glucose meter kit and supplies, , Disp: , Rfl:  .  Calcium Carbonate-Vitamin D (CALCIUM-VITAMIN D) 600-125 MG-UNIT TABS, Take 1 tablet by mouth daily., Disp: , Rfl:  .  [employer] ALCOHOL SWABS PADS, USE 4 TIMES DAILY TO WIPE SKIN BEFORE USING INSULIN, Disp: 400 each, Rfl: 2 .  insulin aspart protamine- aspart (NOVOLOG MIX 70/30) (70-30) 100 UNIT/ML injection, Pt taking 30 units in AM and 10 units before bed only per Dr. Connelly, Disp: , Rfl:  .  losartan-hydrochlorothiazide (HYZAAR) 100-12.5 MG tablet, Take 1 tablet by mouth daily., Disp: 30 tablet, Rfl: 3  Allergies  Allergen Reactions  . Colesevelam Hcl     scotomas  . Daucus [place]ota   . Penicillins Itching     I personally reviewed active problem list, medication list, allergies, family history, social history with the patient/caregiver today.   ROS  Constitutional: Negative for fever or weight change.  Respiratory: Negative for cough and shortness of breath.   Cardiovascular: Negative for chest pain or palpitations.  Gastrointestinal: Negative for abdominal pain, no bowel changes.  Musculoskeletal: Negative for gait problem or joint swelling.  Skin: Negative for rash.  Neurological: Negative for dizziness or headache.  No other specific complaints in a complete review of systems (except as listed in HPI above).  Objective  Vitals:   11/15/18 1315  BP: 140/70    Pulse: 70  Resp: 16  Temp: (!) 97.1 F (36.2 C)  TempSrc: Temporal  SpO2: 96%  Weight: 132 lb 3.2 oz (60 kg)  Height: 5' 3" (1.6 m)    Body mass index is 23.42 kg/m.  Physical Exam  Constitutional: Patient appears well-developed and well-nourished.  No distress.  HEENT: head atraumatic, normocephalic, pupils equal and reactive to light Cardiovascular: Normal rate, regular rhythm and normal heart sounds.  No murmur heard. No BLE edema. Pulmonary/Chest: Effort normal and breath sounds normal. No respiratory distress. Abdominal: Soft.  There is no tenderness. Psychiatric: Patient has a normal mood and affect. behavior is normal. Cooperative  Diabetic Foot Exam: Diabetic Foot Exam - Simple   Simple Foot Form Diabetic Foot exam was performed with the following findings: Yes 11/15/2018  1:39 PM  Visual Inspection No deformities, no ulcerations, no other skin breakdown bilaterally: Yes Sensation Testing Intact to touch and monofilament testing bilaterally: Yes Pulse Check Posterior Tibialis and Dorsalis pulse intact bilaterally: Yes Comments     PHQ2/9: Depression screen PHQ 2/9 11/15/2018 10/14/2018 07/15/2018 03/14/2018 10/12/2017  Decreased Interest 0 0 0 0 0  Down, Depressed, Hopeless 0 0 0 0 0  PHQ - 2 Score 0 0 0 0  0  Altered sleeping 0 - 0 - 0  Tired, decreased energy 0 - 0 - 0  Change in appetite 0 - 0 - 0  Feeling bad or failure about yourself  0 - 0 - 0  Trouble concentrating 0 - 0 - 0  Moving slowly or fidgety/restless 0 - 0 - 0  Suicidal thoughts 0 - 0 - 0  PHQ-9 Score 0 - 0 - 0  Difficult doing work/chores - - Not difficult at all - -    phq 9 is negative   Fall Risk: Fall Risk  11/15/2018 10/14/2018 07/15/2018 03/14/2018 10/12/2017  Falls in the past year? 0 0 0 0 No  Number falls in past yr: 0 0 0 0 -  Injury with Fall? 0 0 0 0 -  Risk for fall due to : - - - - -  Risk for fall due to: Comment - - - - -  Follow up - Falls prevention discussed - - -      Functional Status Survey: Is the patient deaf or have difficulty hearing?: No Does the patient have difficulty seeing, even when wearing glasses/contacts?: No Does the patient have difficulty concentrating, remembering, or making decisions?: Yes Does the patient have difficulty walking or climbing stairs?: Yes Does the patient have difficulty dressing or bathing?: No Does the patient have difficulty doing errands alone such as visiting a doctor's office or shopping?: Yes   Assessment & Plan  1. Type 1 diabetes mellitus with microalbuminuria (HCC)  - COMPLETE METABOLIC PANEL WITH GFR - POCT HgB A1C  2. Atherosclerosis of aorta (HCC)   3. Mild cognitive impairment with memory loss   4. Age-related osteoporosis without current pathological fracture  - DG Bone Density; Future  5. Vitamin D deficiency  Continue supplementation   6. Benign hypertension  COMPLETE METABOLIC PANEL WITH GFR - CBC with Differential/Platelet - amLODipine (NORVASC) 2.5 MG tablet; Take 1 tablet (2.5 mg total) by mouth daily.  Dispense: 30 tablet; Refill: 3 - losartan-hydrochlorothiazide (HYZAAR) 100-12.5 MG tablet; Take 1 tablet by mouth daily.  Dispense: 30 tablet; Refill: 3  7. Dyslipidemia  - Lipid panel - atorvastatin (LIPITOR) 80 MG  tablet; Take 1 tablet (80 mg total) by mouth daily.  Dispense:   30 tablet; Refill: 3  8. Microalbuminuria   9. Hypertension associated with diabetes ([place])  - CBC with Differential/Platelet  10. Need for immunization against influenza  - Flu Vaccine QUAD High Dose(Fluad)

## 2018-11-16 LAB — COMPLETE METABOLIC PANEL WITH GFR
AG Ratio: 1.3 (calc) (ref 1.0–2.5)
ALT: 23 U/L (ref 6–29)
AST: 24 U/L (ref 10–35)
Albumin: 4 g/dL (ref 3.6–5.1)
Alkaline phosphatase (APISO): 57 U/L (ref 37–153)
BUN: 16 mg/dL (ref 7–25)
CO2: 31 mmol/L (ref 20–32)
Calcium: 9.7 mg/dL (ref 8.6–10.4)
Chloride: 101 mmol/L (ref 98–110)
Creat: 0.76 mg/dL (ref 0.60–0.93)
GFR, Est African American: 89 mL/min/{1.73_m2} (ref >=60)
GFR, Est Non African American: 77 mL/min/{1.73_m2} (ref >=60)
Globulin: 3.2 g/dL (calc) (ref 1.9–3.7)
Glucose, Bld: 222 mg/dL — ABNORMAL HIGH (ref 65–99)
Potassium: 4.2 mmol/L (ref 3.5–5.3)
Sodium: 139 mmol/L (ref 135–146)
Total Bilirubin: 0.8 mg/dL (ref 0.2–1.2)
Total Protein: 7.2 g/dL (ref 6.1–8.1)

## 2018-11-16 LAB — CBC WITH DIFFERENTIAL/PLATELET
Absolute Monocytes: 576 cells/uL (ref 200–950)
Basophils Absolute: 40 cells/uL (ref 0–200)
Basophils Relative: 0.6 %
Eosinophils Absolute: 281 cells/uL (ref 15–500)
Eosinophils Relative: 4.2 %
HCT: 38.5 % (ref 35.0–45.0)
Hemoglobin: 12.8 g/dL (ref 11.7–15.5)
Lymphs Abs: 1977 cells/uL (ref 850–3900)
MCH: 29.8 pg (ref 27.0–33.0)
MCHC: 33.2 g/dL (ref 32.0–36.0)
MCV: 89.5 fL (ref 80.0–100.0)
MPV: 11.2 fL (ref 7.5–12.5)
Monocytes Relative: 8.6 %
Neutro Abs: 3826 cells/uL (ref 1500–7800)
Neutrophils Relative %: 57.1 %
Platelets: 212 10*3/uL (ref 140–400)
RBC: 4.3 10*6/uL (ref 3.80–5.10)
RDW: 13.3 % (ref 11.0–15.0)
Total Lymphocyte: 29.5 %
WBC: 6.7 10*3/uL (ref 3.8–10.8)

## 2018-11-16 LAB — LIPID PANEL
Cholesterol: 177 mg/dL (ref <200)
HDL: 50 mg/dL (ref >=50)
LDL Cholesterol (Calc): 112 mg/dL (calc) — ABNORMAL HIGH
Non-HDL Cholesterol (Calc): 127 mg/dL (calc) (ref <130)
Total CHOL/HDL Ratio: 3.5 (calc) (ref <5.0)
Triglycerides: 63 mg/dL (ref <150)

## 2018-12-14 DIAGNOSIS — E1065 Type 1 diabetes mellitus with hyperglycemia: Secondary | ICD-10-CM | POA: Diagnosis not present

## 2018-12-14 DIAGNOSIS — E785 Hyperlipidemia, unspecified: Secondary | ICD-10-CM | POA: Diagnosis not present

## 2018-12-14 DIAGNOSIS — E1069 Type 1 diabetes mellitus with other specified complication: Secondary | ICD-10-CM | POA: Diagnosis not present

## 2018-12-14 DIAGNOSIS — M810 Age-related osteoporosis without current pathological fracture: Secondary | ICD-10-CM | POA: Diagnosis not present

## 2018-12-14 DIAGNOSIS — E1159 Type 2 diabetes mellitus with other circulatory complications: Secondary | ICD-10-CM | POA: Diagnosis not present

## 2018-12-14 DIAGNOSIS — I10 Essential (primary) hypertension: Secondary | ICD-10-CM | POA: Diagnosis not present

## 2019-02-01 DIAGNOSIS — E113593 Type 2 diabetes mellitus with proliferative diabetic retinopathy without macular edema, bilateral: Secondary | ICD-10-CM | POA: Diagnosis not present

## 2019-02-01 LAB — HM DIABETES EYE EXAM

## 2019-02-08 ENCOUNTER — Telehealth: Payer: Self-pay

## 2019-02-08 NOTE — Telephone Encounter (Signed)
Copied from [place] [phone]. Topic: General - Inquiry >> Feb 07, 2019 11:46 AM Reyne Dumas L wrote: Reason for CRM:   Pt's spouse is wanting to know how to go about getting Dr. Ancil Boozer to help them get bars for the shower through Medicaid.

## 2019-02-15 NOTE — Telephone Encounter (Addendum)
kristopher the grandson calling to advise they cannot read the Rx for the medical supplies and the place they are getting from cannot either.  Requesting a call back to clarify.  cb [phone]

## 2019-02-16 NOTE — Telephone Encounter (Signed)
Please rewrite the prescription and don't forget to add the shower chair that they wanted as well.

## 2019-02-21 ENCOUNTER — Other Ambulatory Visit: Payer: Self-pay | Admitting: Family Medicine

## 2019-02-21 DIAGNOSIS — E785 Hyperlipidemia, unspecified: Secondary | ICD-10-CM

## 2019-05-05 ENCOUNTER — Ambulatory Visit: Payer: Medicare Other | Attending: Internal Medicine

## 2019-05-05 DIAGNOSIS — Z23 Encounter for immunization: Secondary | ICD-10-CM | POA: Insufficient documentation

## 2019-05-05 NOTE — Progress Notes (Signed)
   Covid-19 Vaccination Clinic  Name:  [name]    MRN: [id number] DOB: 1940/09/06  05/05/2019  Ms. Super was observed post Covid-19 immunization for 15 minutes without incidence. She was provided with Vaccine Information Sheet and instruction to access the V-Safe system.   [name] was instructed to call 911 with any severe reactions post vaccine: Marland Kitchen Difficulty breathing  . Swelling of your face and throat  . A fast heartbeat  . A bad rash all over your body  . Dizziness and weakness    Immunizations Administered    Name Date Dose VIS Date Route   Pfizer COVID-19 Vaccine 05/05/2019 12:08 PM 0.3 mL 02/17/2019 Intramuscular   Manufacturer: [place]   Lot: [id number]   [place]: [id number]

## 2019-05-12 ENCOUNTER — Other Ambulatory Visit: Payer: Self-pay

## 2019-05-12 ENCOUNTER — Emergency Department
Admission: EM | Admit: 2019-05-12 | Discharge: 2019-05-13 | Disposition: A | Discharge status: Home / Self Care | Payer: Medicare Other | Attending: Emergency Medicine | Admitting: Emergency Medicine

## 2019-05-12 ENCOUNTER — Ambulatory Visit: Payer: Self-pay | Admitting: *Deleted

## 2019-05-12 ENCOUNTER — Emergency Department: Payer: Medicare Other

## 2019-05-12 DIAGNOSIS — Z79899 Other long term (current) drug therapy: Secondary | ICD-10-CM | POA: Diagnosis not present

## 2019-05-12 DIAGNOSIS — E109 Type 1 diabetes mellitus without complications: Secondary | ICD-10-CM | POA: Insufficient documentation

## 2019-05-12 DIAGNOSIS — R110 Nausea: Secondary | ICD-10-CM | POA: Insufficient documentation

## 2019-05-12 DIAGNOSIS — R531 Weakness: Secondary | ICD-10-CM

## 2019-05-12 DIAGNOSIS — M6281 Muscle weakness (generalized): Secondary | ICD-10-CM | POA: Diagnosis not present

## 2019-05-12 DIAGNOSIS — Z20822 Contact with and (suspected) exposure to covid-19: Secondary | ICD-10-CM | POA: Diagnosis not present

## 2019-05-12 DIAGNOSIS — I10 Essential (primary) hypertension: Secondary | ICD-10-CM | POA: Diagnosis not present

## 2019-05-12 DIAGNOSIS — F0390 Unspecified dementia without behavioral disturbance: Secondary | ICD-10-CM | POA: Insufficient documentation

## 2019-05-12 DIAGNOSIS — R42 Dizziness and giddiness: Secondary | ICD-10-CM | POA: Diagnosis not present

## 2019-05-12 LAB — BASIC METABOLIC PANEL
Anion gap: 10 (ref 5–15)
BUN: 17 mg/dL (ref 8–23)
CO2: 27 mmol/L (ref 22–32)
Calcium: 9.8 mg/dL (ref 8.9–10.3)
Chloride: 94 mmol/L — ABNORMAL LOW (ref 98–111)
Creatinine, Ser: 0.72 mg/dL (ref 0.44–1.00)
GFR calc Af Amer: >60 mL/min (ref >60)
GFR calc non Af Amer: >60 mL/min (ref >60)
Glucose, Bld: 228 mg/dL — ABNORMAL HIGH (ref 70–99)
Potassium: 3.6 mmol/L (ref 3.5–5.1)
Sodium: 131 mmol/L — ABNORMAL LOW (ref 135–145)

## 2019-05-12 LAB — CBC
HCT: 40.5 % (ref 36.0–46.0)
Hemoglobin: 13.4 g/dL (ref 12.0–15.0)
MCH: 29.2 pg (ref 26.0–34.0)
MCHC: 33.1 g/dL (ref 30.0–36.0)
MCV: 88.2 fL (ref 80.0–100.0)
Platelets: 260 10*3/uL (ref 150–400)
RBC: 4.59 MIL/uL (ref 3.87–5.11)
RDW: 14.4 % (ref 11.5–15.5)
WBC: 6 10*3/uL (ref 4.0–10.5)
nRBC: 0 % (ref 0.0–0.2)

## 2019-05-12 LAB — TROPONIN I (HIGH SENSITIVITY): Troponin I (High Sensitivity): 4 ng/L (ref <18)

## 2019-05-12 MED ORDER — METOCLOPRAMIDE HCL 5 MG/ML IJ SOLN: 5.0000 mg | Freq: Once | INTRAMUSCULAR | Status: DC

## 2019-05-12 MED ORDER — SODIUM CHLORIDE 0.9 % IV BOLUS
500.0000 mL | Freq: Once | INTRAVENOUS | Status: AC
  Administered 2019-05-12: 500 mL via INTRAVENOUS

## 2019-05-12 NOTE — ED Triage Notes (Signed)
Patient's daughter reports extreme fatigue, weakness, nausea at home X 1 week. Patient's daughter reports systolic BP in the [id number] today at home.

## 2019-05-12 NOTE — ED Notes (Addendum)
See triage note. Daughter at bedside, reports pt has had increased weakness/ fatigue x1 week. Pt has equal grip/strength in BUE/BLE.  Denies pain/burning with urination.  Denies recent falls.  Denies chest pain/SHOB.  Hx HTN, BP 195/89, daughter reports pt took regularly prescribed BP medications at 1700, denies headache at this time but daughter reports pt was c/o headache earlier today.

## 2019-05-12 NOTE — Telephone Encounter (Signed)
Daughter called with the patient having fatigue and an elevated b/p today. The fatigue has been going on for about a week. It started after she received the covid vaccine last Friday.  She c/o some nausea and could not eat her breakfast. Her blood sugar was 50 and did eat something after that. Has not checked blood sugar again.  She denies headache, shortness of breath, fever, blurred vision or chest pain. Per protocol, she should see a provider within 24 hours. Her daughter stated that she will take her to the ED to be evaluated., at [facility]. Routing to the practice for review.   Reason for Disposition . Systolic BP  >= 99991111 OR Diastolic >= A999333  Answer Assessment - Initial Assessment Questions 1. BLOOD PRESSURE: "What is the blood pressure?" "Did you take at least two measurements 5 minutes apart?"     200/106 and  181/90 2. ONSET: "When did you take your blood pressure?"     A few mins ago 3. HOW: "How did you obtain the blood pressure?" (e.g., visiting nurse, automatic home BP monitor)     Automatic home BP monitor 4. HISTORY: "Do you have a history of high blood pressure?"     yes 5. MEDICATIONS: "Are you taking any medications for blood pressure?" "Have you missed any doses recently?"     Yes takes medication but has missed this morning's dose 6. OTHER SYMPTOMS: "Do you have any symptoms?" (e.g., headache, chest pain, blurred vision, difficulty breathing, weakness)     fatigue 7. PREGNANCY: "Is there any chance you are pregnant?" "When was your last menstrual period?"     n/a  Protocols used: HIGH BLOOD PRESSURE-A-AH

## 2019-05-12 NOTE — ED Provider Notes (Signed)
[facility] Emergency Department Provider Note  ____________________________________________   I have reviewed the triage vital signs and the nursing notes.   HISTORY  Chief Complaint Weakness   History limited by and level 5 caveat due to dementia: History obtained from family   HPI [name] is a 79 y.o. female who presents to the emergency department today because of concerns for weakness as well as high blood pressure.  Family states that for the past roughly 1 week they have noticed the patient seemed to be more fatigued than normal.  Her speech has been slower and she has not had as much activity.  Today the patient complained of some dizziness.  Blood pressure was checked today and was found to be elevated.  Family has not noticed any cough or shortness of breath.  No reported history of falls or head trauma.   Records reviewed. Per medical record review patient has a history of dementia  Past Medical History:  Diagnosis Date  . Dementia ([place])   . Diabetes mellitus without complication ([place])   . Dysrhythmia   . Heart murmur   . Hypertension     Patient Active Problem List   Diagnosis Date Noted  . Type 1 diabetes mellitus with hyperglycemia, with long-term current use of insulin ([place]) 04/28/2018  . Atherosclerosis of aorta ([place]) 04/01/2016  . Aortic sclerosis 09/19/2014  . Benign hypertension 09/19/2014  . Calculus of gallbladder 09/19/2014  . Carpal tunnel syndrome 09/19/2014  . Detached retina 09/19/2014  . Type 1 diabetes mellitus with microalbuminuria ([place]) 09/19/2014  . Dyslipidemia 09/19/2014  . Hypophosphatemia 09/19/2014  . Mild cognitive impairment with memory loss 09/19/2014  . Microalbuminuria 09/19/2014  . OP (osteoporosis) 09/19/2014  . Menopause 09/19/2014  . Ptosis of eyelid 09/19/2014  . Hypoglycemia associated with diabetes ([place]) 10/24/2013  . Urge incontinence 08/01/2013  . Hyperlipidemia due to type 1 diabetes  mellitus ([place]) 08/01/2013  . Vitamin D deficiency 02/24/2007    Past Surgical History:  Procedure Laterality Date  . BREAST EXCISIONAL BIOPSY Left    neg  . BREAST SURGERY  1970  . EYE SURGERY      Prior to Admission medications   Medication Sig Start Date End Date Taking? Authorizing Provider  ACCU-CHEK AVIVA PLUS test strip USE 1 STRIP UP TO 4 TIMES DAILY 01/22/15   Steele Sizer, MD  alendronate (FOSAMAX) 70 MG tablet Take 1 tablet (70 mg total) by mouth every 7 (seven) days. Take with a full glass of water on an empty stomach. 07/15/18   Steele Sizer, MD  amLODipine (NORVASC) 2.5 MG tablet Take 1 tablet (2.5 mg total) by mouth daily. 11/15/18   Steele Sizer, MD  aspirin EC 81 MG tablet Take 81 mg by mouth daily.    [provider]  atorvastatin (LIPITOR) 80 MG tablet TAKE 1 TABLET BY MOUTH EVERY DAY 02/21/19   Steele Sizer, MD  blood glucose meter kit and supplies  10/02/13   [provider]  Calcium Carbonate-Vitamin D (CALCIUM-VITAMIN D) 600-125 MG-UNIT TABS Take 1 tablet by mouth daily.    [provider]  [employer] ALCOHOL SWABS PADS USE 4 TIMES DAILY TO WIPE SKIN BEFORE USING INSULIN 04/08/15   Ancil Boozer, Drue Stager, MD  insulin aspart protamine- aspart (NOVOLOG MIX 70/30) (70-30) 100 UNIT/ML injection Pt taking 30 units in AM and 10 units before bed only per Dr. Paulla Fore 05/15/14   Lonia Farber, MD  losartan-hydrochlorothiazide (HYZAAR) 100-12.5 MG tablet Take 1 tablet by mouth  daily. 11/15/18   Steele Sizer, MD    Allergies Colesevelam hcl, Daucus carota, and Penicillins  Family History  Problem Relation Age of Onset  . Alzheimer's disease Mother   . Diabetes Mother   . Arthritis Mother   . Diabetes Father   . Hypertension Father   . Hyperlipidemia Father   . Stroke Brother   . Multiple sclerosis Daughter     Social History Social History   Tobacco Use  . Smoking status: Never Smoker  . Smokeless tobacco: Never Used  . Tobacco  comment: smoking cessation materials not required  Substance Use Topics  . Alcohol use: No    Alcohol/week: 0.0 standard drinks  . Drug use: No    Review of Systems Unable to obtain reliable ROS secondary to dementia.  ____________________________________________   PHYSICAL EXAM:  VITAL SIGNS: ED Triage Vitals  Enc Vitals Group     BP 05/12/19 1959 (!) 210/85     Pulse Rate 05/12/19 1959 73     Resp 05/12/19 1959 18     Temp 05/12/19 1959 98.3 F (36.8 C)     Temp src --      SpO2 05/12/19 1959 97 %     Weight 05/12/19 1958 132 lb 4.4 oz (60 kg)     Height 05/12/19 1958 5' (1.524 m)   Constitutional: Awake and alert.  Eyes: Conjunctivae are normal.  ENT      Head: Normocephalic and atraumatic.      Nose: No congestion/rhinnorhea.      Mouth/Throat: Mucous membranes are moist.      Neck: No stridor. Hematological/Lymphatic/Immunilogical: No cervical lymphadenopathy. Cardiovascular: Normal rate, regular rhythm.  No murmurs, rubs, or gallops.  Respiratory: Normal respiratory effort without tachypnea nor retractions. Breath sounds are clear and equal bilaterally. No wheezes/rales/rhonchi. Gastrointestinal: Soft and non tender. No rebound. No guarding.  Genitourinary: Deferred Musculoskeletal: Normal range of motion in all extremities. No lower extremity edema. Neurologic: Dementia. Moving all extremities. Skin:  Skin is warm, dry and intact. No rash noted. ____________________________________________    LABS (pertinent positives/negatives)  CBC wbc 6.0, hgb 13.4, plt 260 BMP na 131, k 3.6, glu 228, cr 0.72  ____________________________________________   EKG  I, Nance Pear, attending physician, personally viewed and interpreted this EKG  EKG Time: 2009 Rate: 71 Rhythm: normal sinus rhythm Axis: normal Intervals: qtc 434 QRS: narrow, q waves in v1 ST changes: no st elevation Impression: abnormal ekg   ____________________________________________     RADIOLOGY  CT head No acute abnormality  ____________________________________________   PROCEDURES  Procedures  ____________________________________________   INITIAL IMPRESSION / ASSESSMENT AND PLAN / ED COURSE  Pertinent labs & imaging results that were available during my care of the patient were reviewed by me and considered in my medical decision making (see chart for details).   Patient presented to the ED brought in by family because of concern for weakness and high blood pressure. ddx would include infection, dehydration, anemia, hypoglycemia amongst other etiologies. Blood work without obvious cause, although some suggestion of dehydration. Will give IVFs. Head CT without acute finding. Awaiting urine at time of sign out.    ____________________________________________   FINAL CLINICAL IMPRESSION(S) / ED DIAGNOSES  Final diagnoses:  Generalized weakness  Essential hypertension  Nausea     Note: This dictation was prepared with Dragon dictation. Any transcriptional errors that result from this process are unintentional     Nance Pear, MD 05/13/19 1649

## 2019-05-12 NOTE — ED Notes (Signed)
Pt assisted to toilet with this RN, unable to provide urine sample at this time

## 2019-05-13 LAB — URINALYSIS, COMPLETE (UACMP) WITH MICROSCOPIC
Bacteria, UA: NONE SEEN
Bilirubin Urine: NEGATIVE
Glucose, UA: >=500 mg/dL — AB
Hgb urine dipstick: NEGATIVE
Ketones, ur: NEGATIVE mg/dL
Leukocytes,Ua: NEGATIVE
Nitrite: NEGATIVE
Protein, ur: NEGATIVE mg/dL
Specific Gravity, Urine: 1.02 (ref 1.005–1.030)
pH: 6 (ref 5.0–8.0)

## 2019-05-13 LAB — SARS CORONAVIRUS 2 (TAT 6-24 HRS): SARS Coronavirus 2: NEGATIVE

## 2019-05-13 MED ORDER — ONDANSETRON 4 MG PO TBDP: 4.0000 mg | ORAL_TABLET | Freq: Three times a day (TID) | ORAL | 0 refills | Status: DC | PRN

## 2019-05-13 MED ORDER — METOPROLOL TARTRATE 5 MG/5ML IV SOLN
5.0000 mg | Freq: Once | INTRAVENOUS | Status: AC
  Administered 2019-05-13: 5 mg via INTRAVENOUS
  Filled 2019-05-13: qty 5

## 2019-05-13 MED ORDER — CLONIDINE HCL 0.1 MG PO TABS: 0.1000 mg | ORAL_TABLET | Freq: Two times a day (BID) | ORAL | 0 refills | Status: DC | PRN

## 2019-05-13 NOTE — ED Provider Notes (Signed)
-----------------------------------------   1:16 AM on 05/13/2019 -----------------------------------------  Updated patient and her granddaughter on urinalysis results.  Blood pressure improved 190/90.  Patient has an appointment with her PCP on Monday.  Advised her to log her blood pressures over the weekend.  Prescribed limited quantity clonidine to use as needed with specific instructions.  Patient received COVID-19 vaccine 1 week ago.  Given her symptoms, will obtain COVID-19 swab.  Strict return precautions given.  Both verbalized understanding agree with plan of care.   Paulette Blanch, MD 05/13/19 [phone]

## 2019-05-13 NOTE — Discharge Instructions (Addendum)
1.  You may take Clonidine 0.1 mg tablet maximum twice daily if: Top blood pressure >180 Bottom blood pressure >100 2.  You will be called if you have a positive COVID-19 result. 3.  Please check your blood pressure morning, afternoon and evening.  Record these in a log and take these to your doctor on Monday. 4.  Return to the ER for worsening symptoms, persistent vomiting, difficulty breathing or other concerns.

## 2019-05-15 ENCOUNTER — Other Ambulatory Visit: Payer: Self-pay

## 2019-05-15 ENCOUNTER — Ambulatory Visit
Admission: RE | Admit: 2019-05-15 | Discharge: 2019-05-15 | Disposition: A | Discharge status: Home / Self Care | Payer: Medicare Other | Source: Ambulatory Visit | Attending: Family Medicine | Admitting: Family Medicine

## 2019-05-15 ENCOUNTER — Ambulatory Visit
Admission: RE | Admit: 2019-05-15 | Discharge: 2019-05-15 | Disposition: A | Discharge status: Home / Self Care | Payer: Medicare Other | Attending: Family Medicine | Admitting: Family Medicine

## 2019-05-15 ENCOUNTER — Ambulatory Visit (INDEPENDENT_AMBULATORY_CARE_PROVIDER_SITE_OTHER): Payer: Medicare Other | Admitting: Family Medicine

## 2019-05-15 ENCOUNTER — Encounter: Payer: Self-pay | Admitting: Family Medicine

## 2019-05-15 VITALS — BP 160/90 | HR 75 | Temp 96.8°F | Resp 16 | Ht 63.0 in | Wt 127.8 lb

## 2019-05-15 DIAGNOSIS — I700 Atherosclerosis of aorta: Secondary | ICD-10-CM

## 2019-05-15 DIAGNOSIS — R2681 Unsteadiness on feet: Secondary | ICD-10-CM

## 2019-05-15 DIAGNOSIS — E1029 Type 1 diabetes mellitus with other diabetic kidney complication: Secondary | ICD-10-CM | POA: Diagnosis not present

## 2019-05-15 DIAGNOSIS — E785 Hyperlipidemia, unspecified: Secondary | ICD-10-CM

## 2019-05-15 DIAGNOSIS — R5383 Other fatigue: Secondary | ICD-10-CM

## 2019-05-15 DIAGNOSIS — R809 Proteinuria, unspecified: Secondary | ICD-10-CM

## 2019-05-15 DIAGNOSIS — E1159 Type 2 diabetes mellitus with other circulatory complications: Secondary | ICD-10-CM

## 2019-05-15 DIAGNOSIS — M810 Age-related osteoporosis without current pathological fracture: Secondary | ICD-10-CM | POA: Diagnosis not present

## 2019-05-15 DIAGNOSIS — G3184 Mild cognitive impairment, so stated: Secondary | ICD-10-CM

## 2019-05-15 DIAGNOSIS — I10 Essential (primary) hypertension: Secondary | ICD-10-CM

## 2019-05-15 DIAGNOSIS — E559 Vitamin D deficiency, unspecified: Secondary | ICD-10-CM

## 2019-05-15 DIAGNOSIS — E871 Hypo-osmolality and hyponatremia: Secondary | ICD-10-CM

## 2019-05-15 LAB — POCT GLYCOSYLATED HEMOGLOBIN (HGB A1C): Hemoglobin A1C: 8.5 % — AB (ref 4.0–5.6)

## 2019-05-15 MED ORDER — HYDRALAZINE HCL 10 MG PO TABS: 10.0000 mg | ORAL_TABLET | Freq: Three times a day (TID) | ORAL | 0 refills | Status: DC | PRN

## 2019-05-15 NOTE — Progress Notes (Signed)
Name: [name]   MRN: [id number]    DOB: April 16, 1940   Date:05/15/2019       Progress Note  Subjective  Chief Complaint  Chief Complaint  Patient presents with  . Diabetes  . Hypertension    Evaluated at ED for dizziness and nausea and elevated BP. Bp has been in range of169/94 and higher.  . Hyperlipidemia    HPI  EC visit: daughter and husband took her to [facility] on Friday because she had been fatigued for about one week, but bp was also elevated. At [facility] she had low sodium level, high glucose in the mid 200's, glycosuria, negative troponin's and COVID-19 test They gave her fluids and bp medication and she was sent home. Since she has been home bp is still elevated but improved. She is still acting tired, but cannot express what is wrong. CT is below, discussed checking urine culture and CXR . She was feeling nauseated last week and dizzy but that has resolved, but still sleepy   IMPRESSION: No acute or reversible finding. Considerable generalized brain atrophy and small-vessel change of the hemispheric white matter has developed since the previous CT of 2012.  Osteoporosis: found on bone density09/02/2017, -2.4 left hip and -3.2 on L1-2 spine ( lower levels excluded because of DDD). She denies any pain, trying to eat a high calcium and high vitamin D diet, also taking otc calcium plus D, no history of fractures in the past. No family history of osteoporosis. Husband stopped giving medication to her . He is not sure why. He states not sure   HTN:she is currently on Losartan hctz and norvasc 2.5 mg daily,  bp at home is not usually checked, however daughter has been checking over the past few days because she has not been feeling well and bp has been elevated in the 160's-180's/90's She denies any pain. EC gave her clonidine 6 pills but she has not used it yet, explained I prefer giving her prn hydralazine and if bp remains high for the next week we can go up on Norvasc to 5 mg daily    Hyperlipidemia:sheis tolerating atorvastatin, no myalgia.She has atherosclerosis of Aorta, reviewed last labs   UYQ:IHKV to seeDr. Jamesetta So under the care of Dr. Honor Junes. Husband forgot to remind CMA she gets A 1 C done at his office today is well controlled at 7.6% at Dr. Honor Junes three months ago was 7.5% She is on Novolog Mix 70/30 30units in am,10units at night, taking statin, aspirin and ARB also, she has history of proteinuria. A1C today was 8.5% , reviewed her glucose records and it goes from 263's down to 50. She has follow up with Dr. Manfred Shirts next week   Mild Cognitive Impairment: she has been getting worse, she needs more bathing and dressing lately, husband is primary caregiver,  She also needs helps with finances and taking medication. Family has noticed a decline in the past few months . She has seen Dr. Manuella Ghazi ( neurologist in the past - and per husband she was diagnosed with microvascular dementia ).  Patient Active Problem List   Diagnosis Date Noted  . Type 1 diabetes mellitus with hyperglycemia, with long-term current use of insulin ([place]) 04/28/2018  . Atherosclerosis of aorta ([place]) 04/01/2016  . Aortic sclerosis 09/19/2014  . Benign hypertension 09/19/2014  . Calculus of gallbladder 09/19/2014  . Carpal tunnel syndrome 09/19/2014  . Detached retina 09/19/2014  . Type 1 diabetes mellitus with microalbuminuria ([place]) 09/19/2014  . Dyslipidemia  09/19/2014  . Hypophosphatemia 09/19/2014  . Mild cognitive impairment with memory loss 09/19/2014  . Microalbuminuria 09/19/2014  . OP (osteoporosis) 09/19/2014  . Menopause 09/19/2014  . Ptosis of eyelid 09/19/2014  . Hypoglycemia associated with diabetes ([place]) 10/24/2013  . Urge incontinence 08/01/2013  . Hyperlipidemia due to type 1 diabetes mellitus ([place]) 08/01/2013  . Vitamin D deficiency 02/24/2007    Past Surgical History:  Procedure Laterality Date  . BREAST EXCISIONAL BIOPSY Left    neg  .  BREAST SURGERY  1970  . EYE SURGERY      Family History  Problem Relation Age of Onset  . Alzheimer's disease Mother   . Diabetes Mother   . Arthritis Mother   . Diabetes Father   . Hypertension Father   . Hyperlipidemia Father   . Stroke Brother   . Multiple sclerosis Daughter     Social History   Tobacco Use  . Smoking status: Never Smoker  . Smokeless tobacco: Never Used  . Tobacco comment: smoking cessation materials not required  Substance Use Topics  . Alcohol use: No    Alcohol/week: 0.0 standard drinks     Current Outpatient Medications:  .  ACCU-CHEK AVIVA PLUS test strip, USE 1 STRIP UP TO 4 TIMES DAILY, Disp: 100 each, Rfl: 3 .  alendronate (FOSAMAX) 70 MG tablet, Take 1 tablet (70 mg total) by mouth every 7 (seven) days. Take with a full glass of water on an empty stomach., Disp: 4 tablet, Rfl: 11 .  amLODipine (NORVASC) 2.5 MG tablet, Take 1 tablet (2.5 mg total) by mouth daily., Disp: 30 tablet, Rfl: 3 .  aspirin EC 81 MG tablet, Take 81 mg by mouth daily., Disp: , Rfl:  .  atorvastatin (LIPITOR) 80 MG tablet, TAKE 1 TABLET BY MOUTH EVERY DAY, Disp: 90 tablet, Rfl: 1 .  blood glucose meter kit and supplies, , Disp: , Rfl:  .  Calcium Carbonate-Vitamin D (CALCIUM-VITAMIN D) 600-125 MG-UNIT TABS, Take 1 tablet by mouth daily., Disp: , Rfl:  .  cloNIDine (CATAPRES) 0.1 MG tablet, Take 1 tablet (0.1 mg total) by mouth 2 (two) times daily as needed., Disp: 6 tablet, Rfl: 0 .  CVS ALCOHOL SWABS PADS, USE 4 TIMES DAILY TO WIPE SKIN BEFORE USING INSULIN, Disp: 400 each, Rfl: 2 .  insulin aspart protamine- aspart (NOVOLOG MIX 70/30) (70-30) 100 UNIT/ML injection, Pt taking 30 units in AM and 10 units before bed only per Dr. Paulla Fore, Disp: , Rfl:  .  losartan-hydrochlorothiazide (HYZAAR) 100-12.5 MG tablet, Take 1 tablet by mouth daily., Disp: 30 tablet, Rfl: 3 .  ondansetron (ZOFRAN ODT) 4 MG disintegrating tablet, Take 1 tablet (4 mg total) by mouth every 8 (eight)  hours as needed for nausea or vomiting., Disp: 20 tablet, Rfl: 0  Allergies  Allergen Reactions  . Colesevelam Hcl     scotomas  . Daucus Carota   . Penicillins Itching    I personally reviewed active problem list, medication list, allergies, family history, social history, health maintenance with the patient/caregiver today.   ROS  Constitutional: Negative for fever or weight change.  Respiratory: Negative for cough and shortness of breath.   Cardiovascular: Negative for chest pain or palpitations.  Gastrointestinal: Negative for abdominal pain, no bowel changes.  Musculoskeletal: Negative for gait problem or joint swelling.  Skin: Negative for rash.  Neurological: Negative for dizziness or headache.  No other specific complaints in a complete review of systems (except as listed in HPI above).  Objective   Vitals:   05/15/19 1320 05/15/19 1328  BP: (!) 170/90 (!) 160/90  Pulse: 75   Resp: 16   Temp: (!) 96.8 F (36 C)   TempSrc: Temporal   SpO2: 98%   Weight: 127 lb 12.8 oz (58 kg)   Height: '5\' 3"'  (1.6 m)     Body mass index is 22.64 kg/m.  Physical Exam  Constitutional: Patient appears frail.  No distress.  HEENT: head atraumatic, normocephalic, pupils equal and reactive to light,  neck supple, throat within normal limits Cardiovascular: Normal rate, regular rhythm and normal heart sounds. 3/6 systolic  murmur heard. No BLE edema. Pulmonary/Chest: Effort normal and breath sounds normal. No respiratory distress. Abdominal: Soft.  There is no tenderness. Psychiatric: History mostly given by daughter Arrie Aran and husband ), she was very quiet but cooperative, in no distress.   Recent Results (from the past 2160 hour(s))  Basic metabolic panel     Status: Abnormal   Collection Time: 05/12/19  8:13 PM  Result Value Ref Range   Sodium 131 (L) 135 - 145 mmol/L   Potassium 3.6 3.5 - 5.1 mmol/L   Chloride 94 (L) 98 - 111 mmol/L   CO2 27 22 - 32 mmol/L   Glucose, Bld  228 (H) 70 - 99 mg/dL    Comment: Glucose reference range applies only to samples taken after fasting for at least 8 hours.   BUN 17 8 - 23 mg/dL   Creatinine, Ser 0.72 0.44 - 1.00 mg/dL   Calcium 9.8 8.9 - 10.3 mg/dL   GFR calc non Af Amer >60 >60 mL/min   GFR calc Af Amer >60 >60 mL/min   Anion gap 10 5 - 15    Comment: Performed at [facility], [address] [place], [place] [address]  CBC     Status: None   Collection Time: 05/12/19  8:13 PM  Result Value Ref Range   WBC 6.0 4.0 - 10.5 K/uL   RBC 4.59 3.87 - 5.11 MIL/uL   Hemoglobin 13.4 12.0 - 15.0 g/dL   HCT 40.5 36.0 - 46.0 %   MCV 88.2 80.0 - 100.0 fL   MCH 29.2 26.0 - 34.0 pg   MCHC 33.1 30.0 - 36.0 g/dL   RDW 14.4 11.5 - 15.5 %   Platelets 260 150 - 400 K/uL   nRBC 0.0 0.0 - 0.2 %    Comment: Performed at [facility], [address], [place], [place] [address]  Troponin I (High Sensitivity)     Status: None   Collection Time: 05/12/19  8:13 PM  Result Value Ref Range   Troponin I (High Sensitivity) 4 <18 ng/L    Comment: (NOTE) Elevated high sensitivity troponin I (hsTnI) values and significant  changes across serial measurements may suggest ACS but many other  chronic and acute conditions are known to elevate hsTnI results.  Refer to the "Links" section for chest pain algorithms and additional  guidance. Performed at [facility], [address] [place], [place] [address]   Urinalysis, Complete w Microscopic     Status: Abnormal   Collection Time: 05/13/19 12:05 AM  Result Value Ref Range   Color, Urine YELLOW (A) YELLOW   APPearance CLEAR (A) CLEAR   Specific Gravity, Urine 1.020 1.005 - 1.030   pH 6.0 5.0 - 8.0   Glucose, UA >=500 (A) NEGATIVE mg/dL   Hgb urine dipstick NEGATIVE NEGATIVE   Bilirubin Urine NEGATIVE NEGATIVE  Ketones, ur NEGATIVE NEGATIVE mg/dL   Protein, ur NEGATIVE NEGATIVE mg/dL   Nitrite NEGATIVE NEGATIVE   Leukocytes,Ua NEGATIVE NEGATIVE   RBC / HPF  0-5 0 - 5 RBC/hpf   WBC, UA 0-5 0 - 5 WBC/hpf   Bacteria, UA NONE SEEN NONE SEEN   Squamous Epithelial / LPF 0-5 0 - 5   Mucus PRESENT     Comment: Performed at [facility], [address] [place], [place] [address]  SARS CORONAVIRUS 2 (TAT 6-24 HRS) Nasopharyngeal Nasopharyngeal Swab     Status: None   Collection Time: 05/13/19  1:59 AM   Specimen: Nasopharyngeal Swab  Result Value Ref Range   SARS Coronavirus 2 NEGATIVE NEGATIVE    Comment: (NOTE) SARS-CoV-2 target nucleic acids are NOT DETECTED. The SARS-CoV-2 RNA is generally detectable in upper and lower respiratory specimens during the acute phase of infection. Negative results do not preclude SARS-CoV-2 infection, do not rule out co-infections with other pathogens, and should not be used as the sole basis for treatment or other patient management decisions. Negative results must be combined with clinical observations, patient history, and epidemiological information. The expected result is Negative. Fact Sheet for Patients: SugarRoll.be Fact Sheet for Healthcare Providers: https://www.woods-mathews.com/ This test is not yet approved or cleared by the [place] FDA and  has been authorized for detection and/or diagnosis of SARS-CoV-2 by FDA under an Emergency Use Authorization (EUA). This EUA will remain  in effect (meaning this test can be used) for the duration of the COVID-19 declaration under Section 56 4(b)(1) of the Act, 21 U.S.C. section 360bbb-3(b)(1), unless the authorization is terminated or revoked sooner. Performed at [facility], [place] [address], [place], [place] [address]     PHQ2/9: Depression screen [facility] 2/9 05/15/2019 11/15/2018 10/14/2018 07/15/2018 03/14/2018  Decreased Interest 0 0 0 0 0  Down, Depressed, Hopeless 0 0 0 0 0  PHQ - 2 Score 0 0 0 0 0  Altered sleeping 0 0 - 0 -  Tired, decreased energy 0 0 - 0 -  Change in appetite 0 0 - 0 -   Feeling bad or failure about yourself  0 0 - 0 -  Trouble concentrating 0 0 - 0 -  Moving slowly or fidgety/restless 0 0 - 0 -  Suicidal thoughts 0 0 - 0 -  PHQ-9 Score 0 0 - 0 -  Difficult doing work/chores - - - Not difficult at all -    phq 9 is negative   Fall Risk: Fall Risk  05/15/2019 11/15/2018 10/14/2018 07/15/2018 03/14/2018  Falls in the past year? 0 0 0 0 0  Number falls in past yr: 0 0 0 0 0  Injury with Fall? 0 0 0 0 0  Risk for fall due to : - - - - -  Risk for fall due to: Comment - - - - -  Follow up - - Falls prevention discussed - -     Functional Status Survey: Is the patient deaf or have difficulty hearing?: No Does the patient have difficulty seeing, even when wearing glasses/contacts?: No Does the patient have difficulty concentrating, remembering, or making decisions?: Yes Does the patient have difficulty walking or climbing stairs?: No Does the patient have difficulty dressing or bathing?: Yes Does the patient have difficulty doing errands alone such as visiting a doctor's office or shopping?: Yes   Assessment & Plan  1. Type 1 diabetes mellitus with microalbuminuria (HCC)  - POCT HgB A1C -  Ambulatory referral to Home Health  2. Atherosclerosis of aorta ([place])  On statin therapy   3. Mild cognitive impairment with memory loss  - Urine Culture - DG Chest 2 View; Future - Ambulatory referral to Home Health  4. Benign hypertension  - COMPLETE METABOLIC PANEL WITH GFR  5. Hypertension associated with diabetes ([place])  - hydrALAZINE (APRESOLINE) 10 MG tablet; Take 1 tablet (10 mg total) by mouth 3 (three) times daily as needed. bp above 150/90  Dispense: 90 tablet; Refill: 0  6. Vitamin D deficiency  Continue vitamin D   7. Dyslipidemia   8. Age-related osteoporosis without current pathological fracture  Not taking alendronate  9. Fatigue, unspecified type  - Urine Culture - DG Chest 2 View; Future  10. Hyponatremia  Recheck level    11. Gait instability  - Ambulatory referral to [facility] on to family members to walk

## 2019-05-16 LAB — COMPLETE METABOLIC PANEL WITH GFR
AG Ratio: 1.2 (calc) (ref 1.0–2.5)
ALT: 23 U/L (ref 6–29)
AST: 27 U/L (ref 10–35)
Albumin: 4.1 g/dL (ref 3.6–5.1)
Alkaline phosphatase (APISO): 71 U/L (ref 37–153)
BUN: 10 mg/dL (ref 7–25)
CO2: 34 mmol/L — ABNORMAL HIGH (ref 20–32)
Calcium: 10.2 mg/dL (ref 8.6–10.4)
Chloride: 99 mmol/L (ref 98–110)
Creat: 0.61 mg/dL (ref 0.60–0.93)
GFR, Est African American: 102 mL/min/{1.73_m2} (ref >=60)
GFR, Est Non African American: 88 mL/min/{1.73_m2} (ref >=60)
Globulin: 3.3 g/dL (calc) (ref 1.9–3.7)
Glucose, Bld: 47 mg/dL — ABNORMAL LOW (ref 65–99)
Potassium: 3.6 mmol/L (ref 3.5–5.3)
Sodium: 140 mmol/L (ref 135–146)
Total Bilirubin: 0.6 mg/dL (ref 0.2–1.2)
Total Protein: 7.4 g/dL (ref 6.1–8.1)

## 2019-05-16 LAB — URINE CULTURE
MICRO NUMBER:: 10227448
SPECIMEN QUALITY:: ADEQUATE

## 2019-05-24 DIAGNOSIS — E1065 Type 1 diabetes mellitus with hyperglycemia: Secondary | ICD-10-CM | POA: Diagnosis not present

## 2019-05-24 DIAGNOSIS — I10 Essential (primary) hypertension: Secondary | ICD-10-CM | POA: Diagnosis not present

## 2019-05-24 DIAGNOSIS — E1159 Type 2 diabetes mellitus with other circulatory complications: Secondary | ICD-10-CM | POA: Diagnosis not present

## 2019-05-24 DIAGNOSIS — M810 Age-related osteoporosis without current pathological fracture: Secondary | ICD-10-CM | POA: Diagnosis not present

## 2019-05-24 DIAGNOSIS — E785 Hyperlipidemia, unspecified: Secondary | ICD-10-CM | POA: Diagnosis not present

## 2019-05-24 DIAGNOSIS — E1069 Type 1 diabetes mellitus with other specified complication: Secondary | ICD-10-CM | POA: Diagnosis not present

## 2019-05-31 ENCOUNTER — Ambulatory Visit: Payer: Medicare Other | Attending: Internal Medicine

## 2019-05-31 DIAGNOSIS — Z23 Encounter for immunization: Secondary | ICD-10-CM

## 2019-05-31 NOTE — Progress Notes (Signed)
   Covid-19 Vaccination Clinic  Name:  [name]    MRN: [id number] DOB: 11-02-1940  05/31/2019  [name] was observed post Covid-19 immunization for 15 minutes without incident. She was provided with Vaccine Information Sheet and instruction to access the V-Safe system.   [name] was instructed to call 911 with any severe reactions post vaccine: Marland Kitchen Difficulty breathing  . Swelling of face and throat  . A fast heartbeat  . A bad rash all over body  . Dizziness and weakness   Immunizations Administered    Name Date Dose VIS Date Route   Pfizer COVID-19 Vaccine 05/31/2019  4:00 PM 0.3 mL 02/17/2019 Intramuscular   Manufacturer: [place]   Lot: [id number]   [place]: [id number]

## 2019-06-06 ENCOUNTER — Other Ambulatory Visit: Payer: Self-pay | Admitting: Family Medicine

## 2019-06-06 DIAGNOSIS — I152 Hypertension secondary to endocrine disorders: Secondary | ICD-10-CM

## 2019-06-06 DIAGNOSIS — E1159 Type 2 diabetes mellitus with other circulatory complications: Secondary | ICD-10-CM

## 2019-06-21 ENCOUNTER — Other Ambulatory Visit: Payer: Self-pay | Admitting: Family Medicine

## 2019-06-21 ENCOUNTER — Telehealth: Payer: Self-pay

## 2019-06-21 MED ORDER — GLUCAGON EMERGENCY 1 MG IJ KIT: 1.0000 | PACK | Freq: Every day | INTRAMUSCULAR | 0 refills | Status: DC | PRN

## 2019-06-21 NOTE — Telephone Encounter (Signed)
Copied from [place] [id number]. Topic: General - Other >> Jun 21, 2019  1:15 PM Mcneil, Ja-Kwan wrote: Reason for CRM: Pt daughter called to make Dr. Ancil Boozer aware that EMS was called for the pt on yesterday but pt was not taken to the hospital. Pt daughter stated pt experienced low blood sugar (in the 40's) and complained of chills as well as had some vomiting. Pt daughter also stated pt has been taking her medication but continues to have elevated blood pressure and they have not heard from anyone regarding the home health care referral. Pt daughter requests return the call.

## 2019-06-22 NOTE — Addendum Note (Signed)
Addended by: Chilton Greathouse on: 06/22/2019 12:59 PM   Modules accepted: Orders

## 2019-06-22 NOTE — Telephone Encounter (Signed)
Next Thursday 06/29/2019

## 2019-06-22 NOTE — Telephone Encounter (Addendum)
Unfortunately, we are having issues with home health referrals system wide and all I can do is keep trying different agencies until I can find one that will accept her.   I've tried [facility] so now I will reach out to [place] with [facility].  Please bear with [place] as we work with this issue.

## 2019-06-22 NOTE — Telephone Encounter (Signed)
[place] just reached back to me and asked for you to place a new referral so they can take care of this issue

## 2019-06-23 ENCOUNTER — Other Ambulatory Visit: Payer: Self-pay | Admitting: Family Medicine

## 2019-06-23 DIAGNOSIS — E1029 Type 1 diabetes mellitus with other diabetic kidney complication: Secondary | ICD-10-CM

## 2019-06-23 DIAGNOSIS — G3184 Mild cognitive impairment, so stated: Secondary | ICD-10-CM

## 2019-06-23 DIAGNOSIS — R809 Proteinuria, unspecified: Secondary | ICD-10-CM

## 2019-06-27 DIAGNOSIS — M810 Age-related osteoporosis without current pathological fracture: Secondary | ICD-10-CM | POA: Diagnosis not present

## 2019-06-27 DIAGNOSIS — G3184 Mild cognitive impairment, so stated: Secondary | ICD-10-CM | POA: Diagnosis not present

## 2019-06-27 DIAGNOSIS — E785 Hyperlipidemia, unspecified: Secondary | ICD-10-CM | POA: Diagnosis not present

## 2019-06-27 DIAGNOSIS — G5600 Carpal tunnel syndrome, unspecified upper limb: Secondary | ICD-10-CM | POA: Diagnosis not present

## 2019-06-27 DIAGNOSIS — Z9181 History of falling: Secondary | ICD-10-CM | POA: Diagnosis not present

## 2019-06-27 DIAGNOSIS — I350 Nonrheumatic aortic (valve) stenosis: Secondary | ICD-10-CM | POA: Diagnosis not present

## 2019-06-27 DIAGNOSIS — E1069 Type 1 diabetes mellitus with other specified complication: Secondary | ICD-10-CM | POA: Diagnosis not present

## 2019-06-27 DIAGNOSIS — E1029 Type 1 diabetes mellitus with other diabetic kidney complication: Secondary | ICD-10-CM | POA: Diagnosis not present

## 2019-06-27 DIAGNOSIS — N3941 Urge incontinence: Secondary | ICD-10-CM | POA: Diagnosis not present

## 2019-06-27 DIAGNOSIS — I700 Atherosclerosis of aorta: Secondary | ICD-10-CM | POA: Diagnosis not present

## 2019-06-27 DIAGNOSIS — I10 Essential (primary) hypertension: Secondary | ICD-10-CM | POA: Diagnosis not present

## 2019-06-27 DIAGNOSIS — Z7982 Long term (current) use of aspirin: Secondary | ICD-10-CM | POA: Diagnosis not present

## 2019-06-27 DIAGNOSIS — E1065 Type 1 diabetes mellitus with hyperglycemia: Secondary | ICD-10-CM | POA: Diagnosis not present

## 2019-06-27 DIAGNOSIS — E1059 Type 1 diabetes mellitus with other circulatory complications: Secondary | ICD-10-CM | POA: Diagnosis not present

## 2019-06-27 DIAGNOSIS — E559 Vitamin D deficiency, unspecified: Secondary | ICD-10-CM | POA: Diagnosis not present

## 2019-07-04 ENCOUNTER — Telehealth: Payer: Self-pay | Admitting: Family Medicine

## 2019-07-04 DIAGNOSIS — E1059 Type 1 diabetes mellitus with other circulatory complications: Secondary | ICD-10-CM | POA: Diagnosis not present

## 2019-07-04 DIAGNOSIS — I350 Nonrheumatic aortic (valve) stenosis: Secondary | ICD-10-CM | POA: Diagnosis not present

## 2019-07-04 DIAGNOSIS — I10 Essential (primary) hypertension: Secondary | ICD-10-CM | POA: Diagnosis not present

## 2019-07-04 DIAGNOSIS — R2681 Unsteadiness on feet: Secondary | ICD-10-CM

## 2019-07-04 DIAGNOSIS — E1065 Type 1 diabetes mellitus with hyperglycemia: Secondary | ICD-10-CM | POA: Diagnosis not present

## 2019-07-04 DIAGNOSIS — E1029 Type 1 diabetes mellitus with other diabetic kidney complication: Secondary | ICD-10-CM | POA: Diagnosis not present

## 2019-07-04 DIAGNOSIS — G3184 Mild cognitive impairment, so stated: Secondary | ICD-10-CM | POA: Diagnosis not present

## 2019-07-04 NOTE — Telephone Encounter (Signed)
Home Health Verbal Orders - Caller/Agency: Stephanie/ [facility] [place]  Callback Number: [phone] Requesting OT Frequency: 1X a week for 5 weeks   The Pts daughter would like an Rx for 2 grab bars and tub transfer bench

## 2019-07-06 ENCOUNTER — Other Ambulatory Visit: Payer: Self-pay | Admitting: Family Medicine

## 2019-07-06 DIAGNOSIS — E1059 Type 1 diabetes mellitus with other circulatory complications: Secondary | ICD-10-CM | POA: Diagnosis not present

## 2019-07-06 DIAGNOSIS — I350 Nonrheumatic aortic (valve) stenosis: Secondary | ICD-10-CM | POA: Diagnosis not present

## 2019-07-06 DIAGNOSIS — I10 Essential (primary) hypertension: Secondary | ICD-10-CM

## 2019-07-06 DIAGNOSIS — G3184 Mild cognitive impairment, so stated: Secondary | ICD-10-CM | POA: Diagnosis not present

## 2019-07-06 DIAGNOSIS — E1029 Type 1 diabetes mellitus with other diabetic kidney complication: Secondary | ICD-10-CM | POA: Diagnosis not present

## 2019-07-06 DIAGNOSIS — E1065 Type 1 diabetes mellitus with hyperglycemia: Secondary | ICD-10-CM | POA: Diagnosis not present

## 2019-07-10 ENCOUNTER — Telehealth: Payer: Self-pay | Admitting: Family Medicine

## 2019-07-10 NOTE — Chronic Care Management (AMB) (Signed)
  Chronic Care Management   Note  07/10/2019 Name: [name] MRN: [id number] DOB: Jan 03, 1941  [name] is a 79 y.o. year old female who is a primary care patient of Steele Sizer, MD. I reached out to [name] by phone today in response to a referral sent by [name]'s health plan.     [name] Spouse [name] was given information about Chronic Care Management services today including:  1. CCM service includes personalized support from designated clinical staff supervised by her physician, including individualized plan of care and coordination with other care providers 2. 24/7 contact phone numbers for assistance for urgent and routine care needs. 3. Service will only be billed when office clinical staff spend 20 minutes or more in a month to coordinate care. 4. Only one practitioner may furnish and bill the service in a calendar month. 5. The patient may stop CCM services at any time (effective at the end of the month) by phone call to the office staff. 6. The patient will be responsible for cost sharing (co-pay) of up to 20% of the service fee (after annual deductible is met).  Patient's spouse agreed to services and verbal consent obtained.   Follow up plan: Telephone appointment with care management team member scheduled for:08/01/2019  Noreene Larsson, [place], [place], [place] [address] Direct Dial: [phone] [email or web address] Website: [email or web address]

## 2019-07-11 DIAGNOSIS — E1065 Type 1 diabetes mellitus with hyperglycemia: Secondary | ICD-10-CM | POA: Diagnosis not present

## 2019-07-11 DIAGNOSIS — G3184 Mild cognitive impairment, so stated: Secondary | ICD-10-CM | POA: Diagnosis not present

## 2019-07-11 DIAGNOSIS — E1029 Type 1 diabetes mellitus with other diabetic kidney complication: Secondary | ICD-10-CM | POA: Diagnosis not present

## 2019-07-11 DIAGNOSIS — I10 Essential (primary) hypertension: Secondary | ICD-10-CM | POA: Diagnosis not present

## 2019-07-11 DIAGNOSIS — E1059 Type 1 diabetes mellitus with other circulatory complications: Secondary | ICD-10-CM | POA: Diagnosis not present

## 2019-07-11 DIAGNOSIS — I350 Nonrheumatic aortic (valve) stenosis: Secondary | ICD-10-CM | POA: Diagnosis not present

## 2019-07-13 DIAGNOSIS — G3184 Mild cognitive impairment, so stated: Secondary | ICD-10-CM | POA: Diagnosis not present

## 2019-07-13 DIAGNOSIS — I350 Nonrheumatic aortic (valve) stenosis: Secondary | ICD-10-CM | POA: Diagnosis not present

## 2019-07-13 DIAGNOSIS — E1029 Type 1 diabetes mellitus with other diabetic kidney complication: Secondary | ICD-10-CM | POA: Diagnosis not present

## 2019-07-13 DIAGNOSIS — E1065 Type 1 diabetes mellitus with hyperglycemia: Secondary | ICD-10-CM | POA: Diagnosis not present

## 2019-07-13 DIAGNOSIS — I10 Essential (primary) hypertension: Secondary | ICD-10-CM | POA: Diagnosis not present

## 2019-07-13 DIAGNOSIS — E1059 Type 1 diabetes mellitus with other circulatory complications: Secondary | ICD-10-CM | POA: Diagnosis not present

## 2019-07-18 DIAGNOSIS — E1065 Type 1 diabetes mellitus with hyperglycemia: Secondary | ICD-10-CM | POA: Diagnosis not present

## 2019-07-18 DIAGNOSIS — E1059 Type 1 diabetes mellitus with other circulatory complications: Secondary | ICD-10-CM | POA: Diagnosis not present

## 2019-07-18 DIAGNOSIS — I350 Nonrheumatic aortic (valve) stenosis: Secondary | ICD-10-CM | POA: Diagnosis not present

## 2019-07-18 DIAGNOSIS — G3184 Mild cognitive impairment, so stated: Secondary | ICD-10-CM | POA: Diagnosis not present

## 2019-07-18 DIAGNOSIS — E1029 Type 1 diabetes mellitus with other diabetic kidney complication: Secondary | ICD-10-CM | POA: Diagnosis not present

## 2019-07-18 DIAGNOSIS — I10 Essential (primary) hypertension: Secondary | ICD-10-CM | POA: Diagnosis not present

## 2019-07-19 DIAGNOSIS — E1029 Type 1 diabetes mellitus with other diabetic kidney complication: Secondary | ICD-10-CM | POA: Diagnosis not present

## 2019-07-19 DIAGNOSIS — E1059 Type 1 diabetes mellitus with other circulatory complications: Secondary | ICD-10-CM | POA: Diagnosis not present

## 2019-07-19 DIAGNOSIS — E1065 Type 1 diabetes mellitus with hyperglycemia: Secondary | ICD-10-CM | POA: Diagnosis not present

## 2019-07-19 DIAGNOSIS — I10 Essential (primary) hypertension: Secondary | ICD-10-CM | POA: Diagnosis not present

## 2019-07-19 DIAGNOSIS — G3184 Mild cognitive impairment, so stated: Secondary | ICD-10-CM | POA: Diagnosis not present

## 2019-07-19 DIAGNOSIS — I350 Nonrheumatic aortic (valve) stenosis: Secondary | ICD-10-CM | POA: Diagnosis not present

## 2019-07-24 ENCOUNTER — Telehealth: Payer: Self-pay | Admitting: Family Medicine

## 2019-07-24 NOTE — Telephone Encounter (Signed)
Home health orders that was faxed that requires a signature

## 2019-07-25 NOTE — Telephone Encounter (Signed)
Please call and schedule appointment.

## 2019-07-25 NOTE — Telephone Encounter (Signed)
I print off last 2 orders from [facility] and faxed again

## 2019-07-25 NOTE — Telephone Encounter (Signed)
appt scheduled for 5.26.2021 wit Dr Ancil Boozer

## 2019-07-25 NOTE — Telephone Encounter (Signed)
Copied from [place] [id number]. Topic: General - Other >> Jul 24, 2019  2:46 PM Rainey Pines A wrote: Patients daughter called to inform Dr. Ancil Boozer that patient is continueing to use the hydrALAZINE (APRESOLINE) 10 MG tablet medication however there hasnt been any changes in patients blood pressure and she is wanting to know what Dr. Ancil Boozer would advise next. Patient also woke up this morning with her right knee swollen and has had difficulty walking on leg.

## 2019-07-25 NOTE — Telephone Encounter (Signed)
This is not a person paperwork that I had.

## 2019-07-25 NOTE — Telephone Encounter (Signed)
I am not sure who is getting paperwork. I put it in the basket and I never get any back. Who is doing paperwork.

## 2019-07-27 DIAGNOSIS — E785 Hyperlipidemia, unspecified: Secondary | ICD-10-CM | POA: Diagnosis not present

## 2019-07-27 DIAGNOSIS — I350 Nonrheumatic aortic (valve) stenosis: Secondary | ICD-10-CM | POA: Diagnosis not present

## 2019-07-27 DIAGNOSIS — E1065 Type 1 diabetes mellitus with hyperglycemia: Secondary | ICD-10-CM | POA: Diagnosis not present

## 2019-07-27 DIAGNOSIS — M810 Age-related osteoporosis without current pathological fracture: Secondary | ICD-10-CM | POA: Diagnosis not present

## 2019-07-27 DIAGNOSIS — Z7982 Long term (current) use of aspirin: Secondary | ICD-10-CM | POA: Diagnosis not present

## 2019-07-27 DIAGNOSIS — N3941 Urge incontinence: Secondary | ICD-10-CM | POA: Diagnosis not present

## 2019-07-27 DIAGNOSIS — I10 Essential (primary) hypertension: Secondary | ICD-10-CM | POA: Diagnosis not present

## 2019-07-27 DIAGNOSIS — G3184 Mild cognitive impairment, so stated: Secondary | ICD-10-CM | POA: Diagnosis not present

## 2019-07-27 DIAGNOSIS — G5600 Carpal tunnel syndrome, unspecified upper limb: Secondary | ICD-10-CM | POA: Diagnosis not present

## 2019-07-27 DIAGNOSIS — E1059 Type 1 diabetes mellitus with other circulatory complications: Secondary | ICD-10-CM | POA: Diagnosis not present

## 2019-07-27 DIAGNOSIS — E559 Vitamin D deficiency, unspecified: Secondary | ICD-10-CM | POA: Diagnosis not present

## 2019-07-27 DIAGNOSIS — E1029 Type 1 diabetes mellitus with other diabetic kidney complication: Secondary | ICD-10-CM | POA: Diagnosis not present

## 2019-07-27 DIAGNOSIS — I700 Atherosclerosis of aorta: Secondary | ICD-10-CM | POA: Diagnosis not present

## 2019-07-27 DIAGNOSIS — Z9181 History of falling: Secondary | ICD-10-CM | POA: Diagnosis not present

## 2019-07-27 DIAGNOSIS — E1069 Type 1 diabetes mellitus with other specified complication: Secondary | ICD-10-CM | POA: Diagnosis not present

## 2019-07-31 ENCOUNTER — Other Ambulatory Visit: Payer: Self-pay | Admitting: Family Medicine

## 2019-07-31 DIAGNOSIS — I152 Hypertension secondary to endocrine disorders: Secondary | ICD-10-CM

## 2019-07-31 NOTE — Telephone Encounter (Signed)
Requested Prescriptions  Pending Prescriptions Disp Refills  . hydrALAZINE (APRESOLINE) 10 MG tablet [Pharmacy Med Name: HYDRALAZINE 10 MG TABLET] 90 tablet 0    Sig: TAKE 1 TABLET (10 MG TOTAL) BY MOUTH 3 (THREE) TIMES DAILY AS NEEDED. BP ABOVE 150/90     Cardiovascular:  Vasodilators Failed - 07/31/2019  1:04 PM      Failed - Last BP in normal range    BP Readings from Last 1 Encounters:  05/15/19 (!) 160/90         Passed - HCT in normal range and within 360 days    HCT  Date Value Ref Range Status  05/12/2019 40.5 36.0 - 46.0 % Final  02/25/2013 45.0 35.0 - 47.0 % Final         Passed - HGB in normal range and within 360 days    Hemoglobin  Date Value Ref Range Status  05/12/2019 13.4 12.0 - 15.0 g/dL Final   HGB  Date Value Ref Range Status  02/25/2013 14.5 12.0 - 16.0 g/dL Final         Passed - RBC in normal range and within 360 days    RBC  Date Value Ref Range Status  05/12/2019 4.59 3.87 - 5.11 MIL/uL Final         Passed - WBC in normal range and within 360 days    WBC  Date Value Ref Range Status  05/12/2019 6.0 4.0 - 10.5 K/uL Final         Passed - PLT in normal range and within 360 days    Platelets  Date Value Ref Range Status  05/12/2019 260 150 - 400 K/uL Final   Platelet  Date Value Ref Range Status  02/25/2013 165 150 - 440 x10 3/mm 3 Final         Passed - Valid encounter within last 12 months    Recent Outpatient Visits          2 months ago Type 1 diabetes mellitus with microalbuminuria [facility])   [facility] Steele Sizer, MD   8 months ago Type 1 diabetes mellitus with microalbuminuria [facility])   [facility] Steele Sizer, MD   1 year ago Age-related osteoporosis without current pathological fracture   [facility] Steele Sizer, MD   1 year ago Type 1 diabetes mellitus with microalbuminuria [facility])   [facility] Steele Sizer, MD   1 year ago Urinary  incontinence, unspecified type   [facility] Steele Sizer, MD      Future Appointments            In 2 days Steele Sizer, MD [facility], [place]   In 1 month Steele Sizer, MD [facility], [place]   In 2 months  [facility], [facility]

## 2019-08-01 ENCOUNTER — Other Ambulatory Visit: Payer: Self-pay

## 2019-08-01 ENCOUNTER — Encounter: Payer: Self-pay | Admitting: Pharmacist

## 2019-08-01 ENCOUNTER — Ambulatory Visit: Payer: Medicare Other | Admitting: Pharmacist

## 2019-08-01 DIAGNOSIS — E1065 Type 1 diabetes mellitus with hyperglycemia: Secondary | ICD-10-CM

## 2019-08-01 DIAGNOSIS — I10 Essential (primary) hypertension: Secondary | ICD-10-CM

## 2019-08-01 DIAGNOSIS — E1059 Type 1 diabetes mellitus with other circulatory complications: Secondary | ICD-10-CM | POA: Diagnosis not present

## 2019-08-01 DIAGNOSIS — I350 Nonrheumatic aortic (valve) stenosis: Secondary | ICD-10-CM | POA: Diagnosis not present

## 2019-08-01 DIAGNOSIS — G3184 Mild cognitive impairment, so stated: Secondary | ICD-10-CM | POA: Diagnosis not present

## 2019-08-01 DIAGNOSIS — E1029 Type 1 diabetes mellitus with other diabetic kidney complication: Secondary | ICD-10-CM | POA: Diagnosis not present

## 2019-08-01 NOTE — Chronic Care Management (AMB) (Signed)
Chronic Care Management Pharmacy  Name: [name]  MRN: [id number] DOB: 1940-10-25  Chief Complaint/ HPI  [name],  79 y.o. , female presents for their Initial CCM visit with the clinical pharmacist via telephone due to COVID-19 Pandemic.  PCP : Steele Sizer, MD  Their chronic conditions include: Type 1 diabetes, osteoporosis, HTN  Office Visits: 3/8 type 1, [place], BP 160/90 P 75 Wt 127 BMI 22.6, aortic atherosclerosis, A1c 7.6%, mild cognitive impairment  Consult Visit:  Medications: Outpatient Encounter Medications as of 08/01/2019  Medication Sig Note  . ACCU-CHEK AVIVA PLUS test strip USE 1 STRIP UP TO 4 TIMES DAILY   . alendronate (FOSAMAX) 70 MG tablet Take 1 tablet (70 mg total) by mouth every 7 (seven) days. Take with a full glass of water on an empty stomach.   Marland Kitchen amLODipine (NORVASC) 2.5 MG tablet TAKE 1 TABLET BY MOUTH EVERY DAY   . aspirin EC 81 MG tablet Take 81 mg by mouth daily.   Marland Kitchen atorvastatin (LIPITOR) 80 MG tablet TAKE 1 TABLET BY MOUTH EVERY DAY   . blood glucose meter kit and supplies  09/19/2014: DX: 250.02 DM II Received from: [facility]  . Calcium Carbonate-Vitamin D (CALCIUM-VITAMIN D) 600-125 MG-UNIT TABS Take 1 tablet by mouth daily. 09/19/2014: Received from: [place]: Take by mouth.  . [facility] ALCOHOL SWABS PADS USE 4 TIMES DAILY TO WIPE SKIN BEFORE USING INSULIN   . insulin aspart protamine- aspart (NOVOLOG MIX 70/30) (70-30) 100 UNIT/ML injection Pt taking 30 units in AM and 10 units before bed only per Dr. Paulla Fore 09/26/2014: Received from: [place]  . losartan-hydrochlorothiazide (HYZAAR) 100-12.5 MG tablet Take 1 tablet by mouth daily.   . cloNIDine (CATAPRES) 0.1 MG tablet Take 1 tablet (0.1 mg total) by mouth 2 (two) times daily as needed. (Patient not taking: Reported on 08/01/2019)   . Glucagon, rDNA, (GLUCAGON EMERGENCY) 1 MG KIT Inject 1 kit as directed daily as  needed. (Patient not taking: Reported on 08/01/2019)   . hydrALAZINE (APRESOLINE) 10 MG tablet TAKE 1 TABLET (10 MG TOTAL) BY MOUTH 3 (THREE) TIMES DAILY AS NEEDED. BP ABOVE 150/90 (Patient not taking: Reported on 08/01/2019)   . ondansetron (ZOFRAN ODT) 4 MG disintegrating tablet Take 1 tablet (4 mg total) by mouth every 8 (eight) hours as needed for nausea or vomiting. (Patient not taking: Reported on 08/01/2019)    No facility-administered encounter medications on file as of 08/01/2019.      Tobacco Use: Low Risk   . Smoking Tobacco Use: Never Smoker  . Smokeless Tobacco Use: Never Used   Current Diagnosis/Assessment:  Goals Addressed            This Visit's Progress   . Chronic Care Management       CARE PLAN ENTRY  Current Barriers:  . Chronic Disease Management support, education, and care coordination needs related to Hypertension, Hyperlipidemia, and Diabetes   Hypertension . Pharmacist Clinical Goal(s): o Over the next 90 days, patient will work with PharmD and providers to achieve BP goal <130/80 . Current regimen:  o Amlodipine, losartan, hydrochlorothiazide, hydralazine as needed BP > 150/90 . Interventions: o None . Patient self care activities - Over the next 90 days, patient will: o Check BP daily, document, and provide at future appointments o Be sure not to use both clonidine and hydralazine for blood pressure spikes o Ensure daily salt intake < 2300 mg/day  Hyperlipidemia . Pharmacist Clinical  Goal(s): o Over the next 90 days, patient will work with PharmD and providers to achieve LDL goal < 70 . Current regimen:  o Atorvastatin 3m daily . Interventions: o None . Patient self care activities - Over the next 90 days, patient will: o Continue to improve diet o Ensure atorvastatin taken daily  Diabetes . Pharmacist Clinical Goal(s): o Over the next 90 days, patient will work with PharmD and providers to achieve A1c goal <7% . Current regimen:   o Novolog 70/30 30 units in the morning and 10 units at bedime . Interventions: o Start Novolog sliding scale if endocrinologist agrees . Patient self care activities - Over the next 90 days, patient will: o Check blood sugar once daily, document, and provide at future appointments o Contact provider with any episodes of hypoglycemia o Continue to improve diet  Medication management . Pharmacist Clinical Goal(s): o Over the next 90 days, patient will work with PharmD and providers to achieve optimal medication adherence . [facility] . Interventions o Comprehensive medication review performed. o Continue current medication management strategy . Patient self care activities - Over the next 90 days, patient will: o Focus on medication adherence by asking Clinical Pharmacy for clarification when in doubt o Take medications as prescribed o Report any questions or concerns to PharmD and/or provider(s)  Initial goal documentation       Diabetes   Recent Relevant Labs: Lab Results  Component Value Date/Time   HGBA1C 8.5 (A) 05/15/2019 01:38 PM   HGBA1C 7.6 (A) 11/15/2018 01:35 PM   HGBA1C 8.5 (A) 04/15/2018 01:22 PM   HGBA1C 9.9 12/15/2017 12:00 AM   HGBA1C 10.6 (A) 04/25/2014 12:00 AM   HGBA1C 14.0 (H) 02/26/2013 10:50 AM   HGBA1C 10.6 (H) 01/23/2012 05:35 PM   MICROALBUR 1.8 03/14/2018 04:53 PM   MICROALBUR 0.8 10/13/2016 09:36 AM     Checking BG: 2x per Day  Recent FBG Readings: 280, 212, 194, 200, 190, 215, 152, 170, 100, 261 Patient has failed these meds in past: NA Patient is currently uncontrolled on the following medications: Novolog 70/30  Last diabetic Foot exam:  Lab Results  Component Value Date/Time   HMDIABEYEEXA Retinopathy (A) 02/01/2019 12:00 AM    Last diabetic Eye exam: No results found for: HMDIABFOOTEX   We discussed:  Dr. OAnnice Needy- endochrine Goal < 7% Denies Glucagon Novolog 70/30 30 units (hold < 100) qam 10 qhs Stopped lunch  because of lows Rarely red meat  Tries to chase numbers with 70/30 which is nearly impossible Could benefit from Novolog sliding scale   Plan  Start Novolog sliding scale Continue current medications   Hypertension    Office blood pressures are  BP Readings from Last 3 Encounters:  05/15/19 (!) 160/90  05/13/19 (!) 191/90  11/15/18 140/70    Patient has failed these meds in the past: NA  Patient checks BP at home daily  Patient home BP readings are ranging: 150/81, 149/68, 153/72, 207/89  We discussed  No longer using clonidine prn Hydralazine stronger than clonidine Hydralazine > 150/90    Plan  Remove clonidine from medication list Continue current medications   Hyperlipidemia   Lipid Panel     Component Value Date/Time   CHOL 177 11/15/2018 0000   CHOL 192 04/10/2015 0909   TRIG 63 11/15/2018 0000   HDL 50 11/15/2018 0000   HDL 66 04/10/2015 0909   LDLCALC 112 (H) 11/15/2018 0000     The 10-year ASCVD risk  score Mikey Bussing DC Jr., et al., 2013) is: 34.5%   Values used to calculate the score:     Age: 48 years     Sex: Female     Is Non-Hispanic African American: Yes     Diabetic: Yes     Tobacco smoker: No     Systolic Blood Pressure: 295 mmHg     Is BP treated: Yes     HDL Cholesterol: 50 mg/dL     Total Cholesterol: 177 mg/dL   Patient has failed these meds in past: NA Patient is currently uncontrolled on the following medications:  . Lipitor 21m daily  We discussed:   Takes daily Unclear why not at goal  Plan  Continue current medications   Medication Management   [facility] Uses pill box? Yes Pt endorses 90% compliance  We discussed:  Not interested in pill packs since patient has dementia and has finally learned how to use pill organizer.  Plan  Continue current medication management strategy  Follow up: 3 month phone visit  TMilus Height PharmD, BNewald CFort [phone]

## 2019-08-01 NOTE — Patient Instructions (Addendum)
Visit Information  Goals Addressed            This Visit's Progress   . Chronic Care Management       CARE PLAN ENTRY  Current Barriers:  . Chronic Disease Management support, education, and care coordination needs related to Hypertension, Hyperlipidemia, and Diabetes   Hypertension . Pharmacist Clinical Goal(s): o Over the next 90 days, patient will work with PharmD and providers to achieve BP goal <130/80 . Current regimen:  o Amlodipine, losartan, hydrochlorothiazide, hydralazine as needed BP > 150/90 . Interventions: o None . Patient self care activities - Over the next 90 days, patient will: o Check BP daily, document, and provide at future appointments o Be sure not to use both clonidine and hydralazine for blood pressure spikes o Ensure daily salt intake < 2300 mg/day  Hyperlipidemia . Pharmacist Clinical Goal(s): o Over the next 90 days, patient will work with PharmD and providers to achieve LDL goal < 70 . Current regimen:  o Atorvastatin 80m daily . Interventions: o None . Patient self care activities - Over the next 90 days, patient will: o Continue to improve diet o Ensure atorvastatin taken daily  Diabetes . Pharmacist Clinical Goal(s): o Over the next 90 days, patient will work with PharmD and providers to achieve A1c goal <7% . Current regimen:  o Novolog 70/30 30 units in the morning and 10 units at bedime . Interventions: o Start Novolog sliding scale if endocrinologist agrees . Patient self care activities - Over the next 90 days, patient will: o Check blood sugar once daily, document, and provide at future appointments o Contact provider with any episodes of hypoglycemia o Continue to improve diet  Medication management . Pharmacist Clinical Goal(s): o Over the next 90 days, patient will work with PharmD and providers to achieve optimal medication adherence . Current pharmacy: [facility] . Interventions o Comprehensive medication review  performed. o Continue current medication management strategy . Patient self care activities - Over the next 90 days, patient will: o Focus on medication adherence by asking Clinical Pharmacy for clarification when in doubt o Take medications as prescribed o Report any questions or concerns to PharmD and/or provider(s)  Initial goal documentation        Ms. BLookabaughwas given information about Chronic Care Management services today including:  1. CCM service includes personalized support from designated clinical staff supervised by her physician, including individualized plan of care and coordination with other care providers 2. 24/7 contact phone numbers for assistance for urgent and routine care needs. 3. Standard insurance, coinsurance, copays and deductibles apply for chronic care management only during months in which we provide at least 20 minutes of these services. Most insurances cover these services at 100%, however patients may be responsible for any copay, coinsurance and/or deductible if applicable. This service may help you avoid the need for more expensive face-to-face services. 4. Only one practitioner may furnish and bill the service in a calendar month. 5. The patient may stop CCM services at any time (effective at the end of the month) by phone call to the office staff.  Patient agreed to services and verbal consent obtained.   Print copy of patient instructions provided.  Telephone follow up appointment with pharmacy team member scheduled for: 3 months  TMilus Height PharmD, [phone] Insulin  Correction insulin, also called corrective insulin or a supplemental dose, is a small amount of insulin that can be used  to lower your blood sugar (glucose) if it is too high. You may be instructed to check your blood glucose at certain times of the day and to use correction insulin as needed to lower your blood  glucose to your target range. Correction insulin is primarily used as part of diabetes management. It may also be prescribed for people who do not have diabetes. What is a correction scale? A correction scale, also called a sliding scale, is prescribed by your health care provider to help you determine when you need correction insulin. Your correction scale is based on your individual treatment goals, and it has two parts:  Ranges of blood glucose levels.  How much correction insulin to give yourself if your blood sugar falls within a certain range. If your blood glucose is in your desired range, you will not need correction insulin and you should take your normal insulin dose. What type of insulin do I need? Your health care provider may prescribe rapid-acting or short-acting insulin for you to use as correction insulin. Rapid-acting insulin:  Starts working quickly, in as little as 5 minutes.  Can last for 3-6 hours.  Works well when taken right before a meal to quickly lower blood glucose. Short-acting insulin:  Starts working in about 30 minutes.  Can last for 6-8 hours.  Should be taken about 30 minutes before you start eating a meal. Talk with your health care provider or pharmacist about which type of correction insulin to take and when to take it. If you use insulin to control your diabetes, you should use correction insulin in addition to the longer-acting (basal) insulin that you normally use. How do I manage my blood glucose with correction insulin? Giving a correction dose  Check your blood glucose as directed by your health care provider.  Use your correction scale to find the range that your blood glucose is in.  Identify the units of insulin that match your blood glucose range.  Make sure you have food available that you can eat in the next 15-30 minutes, after your correction dose.  Give yourself the dose of correction insulin that your health care provider has  prescribed in your correction scale. Always make sure you are using the right type of insulin. ? If your correction insulin is rapid-acting, start eating a meal within 15 minutes after your correction dose to keep your blood glucose from getting too low. ? If your correction insulin is short-acting, start eating a meal within 30 minutes after your correction dose to keep your blood glucose from getting too low. Keeping a blood glucose log   Write down your blood glucose test results and the amount of insulin that you give yourself. Do this every time you check blood glucose or take insulin. Bring this log with you to your medical visits. This information will help your health care provider to manage your medicines.  Note anything that may affect your blood glucose, such as: ? Changes in normal exercise or activity. ? Changes in your normal schedule, such as changes in your sleep routine, going on vacation, changing your diet, or holidays. ? New over-the-counter or prescription medicines. ? Illness, stress, or anxiety. ? Changes in the time that you took your medicine or insulin. ? Changes in your meals, such as skipping a meal, having a late meal, or dining out. ? Eating things that may affect blood glucose, such as snacks, meal portions that are larger than normal, drinks that contain sugar, or eating  less than usual. What do I need to know about hyperglycemia and hypoglycemia? What is hyperglycemia? Hyperglycemia, also called high blood glucose, occurs when blood glucose is too high. Make sure you know the early signs of hyperglycemia, such as:  Increased thirst.  Hunger.  Feeling very tired.  Needing to urinate more often than usual.  Blurry vision. What is hypoglycemia? Hypoglycemia is also called low blood glucose. Be aware of "stacking" your insulin doses. This happens when you correct a high blood glucose by giving yourself extra insulin too soon after a previous correction dose  or mealtime dose. This may cause you to have too much insulin in your body and may put you at risk for hypoglycemia. Hypoglycemia occurs with a blood glucose level at or below 70 mg/dL (3.9 mmol/L). It is important to know the symptoms of hypoglycemia and treat it right away. Always have a 15-gram rapid-acting carbohydrate snack with you to treat low blood glucose. Family members and close friends should also know the symptoms and should understand how to treat hypoglycemia, in case you are not able to treat yourself. What are the symptoms of hypoglycemia? Hypoglycemia symptoms can include:  Hunger.  Anxiety.  Sweating and feeling clammy.  Confusion.  Dizziness or light-headedness.  Sleepiness.  Nausea.  Increased heart rate.  Headache.  Blurry vision.  Jerky movements that you cannot control (seizure).  Nightmares.  Tingling or numbness around the mouth, lips, or tongue.  A change in speech.  Decreased ability to concentrate.  A change in coordination.  Restless sleep.  Tremors or shakes.  Fainting.  Irritability. How do I treat hypoglycemia? If you are alert and able to swallow safely, follow the 15:15 rule:  Take 15 grams of a rapid-acting carbohydrate. Rapid-acting options include: ? 1 tube of glucose gel. ? 3 glucose pills. ? 6-8 pieces of hard candy. ? 4 oz (120 mL) of fruit juice. ? 4 oz (120 mL) of regular (not diet) soda.  Check your blood glucose 15 minutes after you take the carbohydrate. ? If the repeat blood glucose level is still at or below 70 mg/dL (3.9 mmol/L), take 15 grams of a carbohydrate again. ? If your blood glucose level does not increase above 70 mg/dL (3.9 mmol/L) after 3 tries, seek emergency medical care.  After your blood glucose level returns to normal, eat a meal or a snack within 1 hour.  How do I treat severe hypoglycemia? Severe hypoglycemia is when your blood glucose level is at or below 54 mg/dL (3 mmol/L). Severe  hypoglycemia is an emergency. Do not wait to see if the symptoms will go away. Get medical help right away. Call your local emergency services (911 in the [place] Do not drive yourself to the hospital. If you have severe hypoglycemia and you cannot eat or drink, you may need an injection of glucagon. A family member or close friend should learn how to check your blood glucose and how to give you a glucagon injection. Ask your health care provider if you need to have an emergency glucagon injection kit available. Severe hypoglycemia may need to be treated in a hospital. The treatment may include getting glucose through an IV tube. You may also need treatment for the cause of your hypoglycemia. Why do I need correction insulin if I do not have diabetes? If you do not have diabetes, your health care provider may prescribe insulin because:  Keeping your blood glucose in the target range is important for your overall health.  You are taking medicines that cause your blood glucose to be higher than normal. Contact a health care provider if:  You develop low blood glucose that you are not able to treat yourself.  You have high blood glucose that you are not able to correct with correction insulin.  Your blood glucose is often too low.  You used emergency glucagon to treat low blood glucose. Get help right away if:  You become unresponsive. If this happens, someone else should call emergency services (911 in the [place] right away.  Your blood glucose is lower than 54 mg/dL (3.0 mmol/L).  You become confused or you have trouble thinking clearly.  You have difficulty breathing. Summary  Correction insulin is a small amount of insulin that can be used to lower your blood sugar (glucose) if it is too high.  Talk with your health care provider or pharmacist about which type of correction insulin to take and when to take it. If you use insulin to control your diabetes, you should use correction  insulin in addition to the longer-acting (basal) insulin that you normally use.  You may be instructed to check your blood glucose at certain times of the day and to use correction insulin as needed to lower your blood glucose to your target range. Always keep a log of your blood glucose values and the amount of insulin that you used.  It is important to know the symptoms of hypoglycemia and treat it right away. Always have a 15-gram rapid-acting carbohydrate snack with you to treat low blood glucose. Family members and close friends should also know the symptoms and should understand how to treat hypoglycemia, in case you are not able to treat yourself. This information is not intended to replace advice given to you by your health care provider. Make sure you discuss any questions you have with your health care provider. Document Revised: 03/05/2016 Document Reviewed: 11/22/2015 Elsevier Patient Education  2020 [employer].

## 2019-08-02 ENCOUNTER — Ambulatory Visit (INDEPENDENT_AMBULATORY_CARE_PROVIDER_SITE_OTHER): Payer: Medicare Other | Admitting: Family Medicine

## 2019-08-02 ENCOUNTER — Encounter: Payer: Self-pay | Admitting: Family Medicine

## 2019-08-02 VITALS — BP 150/70 | HR 83 | Temp 96.8°F | Resp 16 | Ht 63.0 in | Wt 130.2 lb

## 2019-08-02 DIAGNOSIS — I10 Essential (primary) hypertension: Secondary | ICD-10-CM | POA: Diagnosis not present

## 2019-08-02 DIAGNOSIS — M810 Age-related osteoporosis without current pathological fracture: Secondary | ICD-10-CM | POA: Diagnosis not present

## 2019-08-02 DIAGNOSIS — E1065 Type 1 diabetes mellitus with hyperglycemia: Secondary | ICD-10-CM | POA: Diagnosis not present

## 2019-08-02 DIAGNOSIS — M25461 Effusion, right knee: Secondary | ICD-10-CM

## 2019-08-02 MED ORDER — ALENDRONATE SODIUM 70 MG PO TABS: 70.0000 mg | ORAL_TABLET | ORAL | 1 refills | Status: DC

## 2019-08-02 MED ORDER — AMLODIPINE BESYLATE 5 MG PO TABS: 5.0000 mg | ORAL_TABLET | Freq: Every day | ORAL | 0 refills | Status: DC

## 2019-08-02 NOTE — Progress Notes (Addendum)
Name: [name]   MRN: [id number]    DOB: 1941/01/22   Date:08/02/2019       Progress Note  Subjective  Chief Complaint  Chief Complaint  Patient presents with  . Hypertension    concerns about elevated BP and mini stroke  . Hyperglycemia  . Knee Pain    She had swelling in her right knee x 2 weeks ago. Painful to touch and to move.    HPI  Right knee effusion: she developed swelling and pain on right knee a couple of weeks ago, she could not bear weight initially, it was very tender to touch . They tried Advil and Bengaid topically, also applied ice and she is feeling better, however still has pain when standing and with touch. No fever or chills. No previous history of gout  DM with hyperglycemia: she is under the care of Dr. Honor Junes , she is on 70/30 twice daily, last A1C was above 8, discussed going back sooner to see him.   HTN: her bp has been consistently elevated over the past couple of months, on May 10th bp went up to 180-200 and she was acting lethargic , daughter was concerned about her having a mini stroke but did not take her to [facility]. She states she was just not the same. Explained symptoms of stroke and heart attack and when in doubt take her to [facility] She has been taking hydralazine tid and bp has been consistently above 140/90, we will adjust dose of Norvasc from 2.5 to 5 mg, daughter would like to keep some hydralazine to use prn only at home. Husband is able to check her bp and give medication as needed. She has been off clonidine for a long time   Patient Active Problem List   Diagnosis Date Noted  . Type 1 diabetes mellitus with hyperglycemia, with long-term current use of insulin ([place]) 04/28/2018  . Atherosclerosis of aorta ([place]) 04/01/2016  . Aortic sclerosis 09/19/2014  . Benign hypertension 09/19/2014  . Calculus of gallbladder 09/19/2014  . Carpal tunnel syndrome 09/19/2014  . Detached retina 09/19/2014  . Type 1 diabetes mellitus with microalbuminuria ([place])  09/19/2014  . Dyslipidemia 09/19/2014  . Hypophosphatemia 09/19/2014  . Mild cognitive impairment with memory loss 09/19/2014  . Microalbuminuria 09/19/2014  . OP (osteoporosis) 09/19/2014  . Menopause 09/19/2014  . Ptosis of eyelid 09/19/2014  . Hypoglycemia associated with diabetes ([place]) 10/24/2013  . Urge incontinence 08/01/2013  . Hyperlipidemia due to type 1 diabetes mellitus ([place]) 08/01/2013  . Vitamin D deficiency 02/24/2007    Past Surgical History:  Procedure Laterality Date  . BREAST EXCISIONAL BIOPSY Left    neg  . BREAST SURGERY  1970  . EYE SURGERY      Family History  Problem Relation Age of Onset  . Alzheimer's disease Mother   . Diabetes Mother   . Arthritis Mother   . Diabetes Father   . Hypertension Father   . Hyperlipidemia Father   . Stroke Brother   . Multiple sclerosis Daughter     Social History   Tobacco Use  . Smoking status: Never Smoker  . Smokeless tobacco: Never Used  . Tobacco comment: smoking cessation materials not required  Substance Use Topics  . Alcohol use: No    Alcohol/week: 0.0 standard drinks     Current Outpatient Medications:  .  ACCU-CHEK AVIVA PLUS test strip, USE 1 STRIP UP TO 4 TIMES DAILY, Disp: 100 each, Rfl: 3 .  alendronate (FOSAMAX) 70  MG tablet, Take 1 tablet (70 mg total) by mouth every 7 (seven) days. Take with a full glass of water on an empty stomach., Disp: 12 tablet, Rfl: 1 .  amLODipine (NORVASC) 5 MG tablet, Take 1 tablet (5 mg total) by mouth daily., Disp: 90 tablet, Rfl: 0 .  aspirin EC 81 MG tablet, Take 81 mg by mouth daily., Disp: , Rfl:  .  atorvastatin (LIPITOR) 80 MG tablet, TAKE 1 TABLET BY MOUTH EVERY DAY, Disp: 90 tablet, Rfl: 1 .  blood glucose meter kit and supplies, , Disp: , Rfl:  .  Calcium Carbonate-Vitamin D (CALCIUM-VITAMIN D) 600-125 MG-UNIT TABS, Take 1 tablet by mouth daily., Disp: , Rfl:  .  [employer] ALCOHOL SWABS PADS, USE 4 TIMES DAILY TO WIPE SKIN BEFORE USING INSULIN, Disp: 400  each, Rfl: 2 .  Glucagon, rDNA, (GLUCAGON EMERGENCY) 1 MG KIT, Inject 1 kit as directed daily as needed., Disp: 1 kit, Rfl: 0 .  hydrALAZINE (APRESOLINE) 10 MG tablet, TAKE 1 TABLET (10 MG TOTAL) BY MOUTH 3 (THREE) TIMES DAILY AS NEEDED. BP ABOVE 150/90, Disp: 90 tablet, Rfl: 0 .  insulin aspart protamine- aspart (NOVOLOG MIX 70/30) (70-30) 100 UNIT/ML injection, Pt taking 30 units in AM and 10 units before bed only per Dr. Paulla Fore, Disp: , Rfl:  .  losartan-hydrochlorothiazide (HYZAAR) 100-12.5 MG tablet, Take 1 tablet by mouth daily., Disp: 30 tablet, Rfl: 3 .  ondansetron (ZOFRAN ODT) 4 MG disintegrating tablet, Take 1 tablet (4 mg total) by mouth every 8 (eight) hours as needed for nausea or vomiting., Disp: 20 tablet, Rfl: 0  Allergies  Allergen Reactions  . Colesevelam Hcl     scotomas  . Daucus [place]   . Penicillins Itching    I personally reviewed active problem list, medication list, allergies, family history, social history, health maintenance with the patient/caregiver today.   ROS  She does not speak much, I asked her how she was feeling and she said : Okay and smiled. Hard to get a good history, most of the information was given by her daughter  Objective  Vitals:   08/02/19 1427  BP: (!) 150/70  Pulse: 83  Resp: 16  Temp: (!) 96.8 F (36 C)  TempSrc: Temporal  SpO2: 94%  Weight: 130 lb 3.2 oz (59.1 kg)  Height: 5' 3" (1.6 m)    Body mass index is 23.06 kg/m.  Physical Exam  Constitutional: Patient appears well-developed and well-nourished. No distress.  HEENT: head atraumatic, normocephalic, pupils equal and reactive to light, neck supple Cardiovascular: Normal rate, regular rhythm and 3/5 systolic ejection murmur  No murmur heard. No BLE edema. Pulmonary/Chest: Effort normal and breath sounds normal. No respiratory distress. Abdominal: Soft.  There is no tenderness. Muscular Skeletal: effusion right knee, tender to touch, slight increase in warmth but  no erythema  Psychiatric: Patient has a normal mood and affect. behavior is normal. Judgment and thought content normal.  Recent Results (from the past 2160 hour(s))  Basic metabolic panel     Status: Abnormal   Collection Time: 05/12/19  8:13 PM  Result Value Ref Range   Sodium 131 (L) 135 - 145 mmol/L   Potassium 3.6 3.5 - 5.1 mmol/L   Chloride 94 (L) 98 - 111 mmol/L   CO2 27 22 - 32 mmol/L   Glucose, Bld 228 (H) 70 - 99 mg/dL    Comment: Glucose reference range applies only to samples taken after fasting for at least 8 hours.  BUN 17 8 - 23 mg/dL   Creatinine, Ser 0.72 0.44 - 1.00 mg/dL   Calcium 9.8 8.9 - 10.3 mg/dL   GFR calc non Af Amer >60 >60 mL/min   GFR calc Af Amer >60 >60 mL/min   Anion gap 10 5 - 15    Comment: Performed at [facility], [address] [place], [place] [address]  CBC     Status: None   Collection Time: 05/12/19  8:13 PM  Result Value Ref Range   WBC 6.0 4.0 - 10.5 K/uL   RBC 4.59 3.87 - 5.11 MIL/uL   Hemoglobin 13.4 12.0 - 15.0 g/dL   HCT 40.5 36.0 - 46.0 %   MCV 88.2 80.0 - 100.0 fL   MCH 29.2 26.0 - 34.0 pg   MCHC 33.1 30.0 - 36.0 g/dL   RDW 14.4 11.5 - 15.5 %   Platelets 260 150 - 400 K/uL   nRBC 0.0 0.0 - 0.2 %    Comment: Performed at [facility], [address] [place], [place] [address]  Troponin I (High Sensitivity)     Status: None   Collection Time: 05/12/19  8:13 PM  Result Value Ref Range   Troponin I (High Sensitivity) 4 <18 ng/L    Comment: (NOTE) Elevated high sensitivity troponin I (hsTnI) values and significant  changes across serial measurements may suggest ACS but many other  chronic and acute conditions are known to elevate hsTnI results.  Refer to the "Links" section for chest pain algorithms and additional  guidance. Performed at [facility], [address] [place], [place] [address]   Urinalysis, Complete w Microscopic     Status: Abnormal   Collection Time: 05/13/19 12:05 AM   Result Value Ref Range   Color, Urine YELLOW (A) YELLOW   APPearance CLEAR (A) CLEAR   Specific Gravity, Urine 1.020 1.005 - 1.030   pH 6.0 5.0 - 8.0   Glucose, UA >=500 (A) NEGATIVE mg/dL   Hgb urine dipstick NEGATIVE NEGATIVE   Bilirubin Urine NEGATIVE NEGATIVE   Ketones, ur NEGATIVE NEGATIVE mg/dL   Protein, ur NEGATIVE NEGATIVE mg/dL   Nitrite NEGATIVE NEGATIVE   Leukocytes,Ua NEGATIVE NEGATIVE   RBC / HPF 0-5 0 - 5 RBC/hpf   WBC, UA 0-5 0 - 5 WBC/hpf   Bacteria, UA NONE SEEN NONE SEEN   Squamous Epithelial / LPF 0-5 0 - 5   Mucus PRESENT     Comment: Performed at [facility], [address] [place],  [address]  SARS CORONAVIRUS 2 (TAT 6-24 HRS) Nasopharyngeal Nasopharyngeal Swab     Status: None   Collection Time: 05/13/19  1:59 AM   Specimen: Nasopharyngeal Swab  Result Value Ref Range   SARS Coronavirus 2 NEGATIVE NEGATIVE    Comment: (NOTE) SARS-CoV-2 target nucleic acids are NOT DETECTED. The SARS-CoV-2 RNA is generally detectable in upper and lower respiratory specimens during the acute phase of infection. Negative results do not preclude SARS-CoV-2 infection, do not rule out co-infections with other pathogens, and should not be used as the sole basis for treatment or other patient management decisions. Negative results must be combined with clinical observations, patient history, and epidemiological information. The expected result is Negative. Fact Sheet for Patients: https://www.fda.gov/media/138098/download Fact Sheet for Healthcare Providers: https://www.fda.gov/media/138095/download This test is not yet approved or cleared by the [place] FDA and  has been authorized for detection and/or diagnosis of SARS-CoV-2 by FDA under an Emergency Use Authorization (EUA). This EUA   will remain  in effect (meaning this test can be used) for the duration of the COVID-19 declaration under Section 56 4(b)(1) of the Act, 21 U.S.C. section  360bbb-3(b)(1), unless the authorization is terminated or revoked sooner. Performed at [facility], [place] [address], [place], [place] [address]   POCT HgB A1C     Status: Abnormal   Collection Time: 05/15/19  1:38 PM  Result Value Ref Range   Hemoglobin A1C 8.5 (A) 4.0 - 5.6 %   HbA1c POC (<> result, manual entry)     HbA1c, POC (prediabetic range)     HbA1c, POC (controlled diabetic range)    COMPLETE METABOLIC PANEL WITH GFR     Status: Abnormal   Collection Time: 05/15/19  2:08 PM  Result Value Ref Range   Glucose, Bld 47 (L) 65 - 99 mg/dL    Comment: .            Fasting reference interval .    BUN 10 7 - 25 mg/dL   Creat 0.61 0.60 - 0.93 mg/dL    Comment: For patients >31 years of age, the reference limit for Creatinine is approximately 13% higher for people identified as African-American. .    GFR, Est Non African American 88 > OR = 60 mL/min/1.47m   GFR, Est African American 102 > OR = 60 mL/min/1.71m  BUN/Creatinine Ratio NOT APPLICABLE 6 - 22 (calc)   Sodium 140 135 - 146 mmol/L   Potassium 3.6 3.5 - 5.3 mmol/L   Chloride 99 98 - 110 mmol/L   CO2 34 (H) 20 - 32 mmol/L   Calcium 10.2 8.6 - 10.4 mg/dL   Total Protein 7.4 6.1 - 8.1 g/dL   Albumin 4.1 3.6 - 5.1 g/dL   Globulin 3.3 1.9 - 3.7 g/dL (calc)   AG Ratio 1.2 1.0 - 2.5 (calc)   Total Bilirubin 0.6 0.2 - 1.2 mg/dL   Alkaline phosphatase (APISO) 71 37 - 153 U/L   AST 27 10 - 35 U/L   ALT 23 6 - 29 U/L  Urine Culture     Status: None   Collection Time: 05/15/19  2:08 PM  Result Value Ref Range   MICRO NUMBER: [id number]  SPECIMEN QUALITY: Adequate    Sample Source URINE    STATUS: FINAL    ISOLATE 1:      Growth of mixed flora was isolated, suggesting probable contamination. No further testing will be performed. If clinically indicated, recollection using a method to minimize contamination, with prompt transfer to Urine Culture Transport Tube, is  recommended.      PHQ2/9: Depression screen  [facility] 08/02/2019 05/15/2019 11/15/2018 10/14/2018 07/15/2018  Decreased Interest 0 0 0 0 0  Down, Depressed, Hopeless 0 0 0 0 0  PHQ - 2 Score 0 0 0 0 0  Altered sleeping 0 0 0 - 0  Tired, decreased energy 0 0 0 - 0  Change in appetite 0 0 0 - 0  Feeling bad or failure about yourself  0 0 0 - 0  Trouble concentrating 0 0 0 - 0  Moving slowly or fidgety/restless 0 0 0 - 0  Suicidal thoughts 0 0 0 - 0  PHQ-9 Score 0 0 0 - 0  Difficult doing work/chores - - - - Not difficult at all    phq 9 is negative   Fall Risk: Fall Risk  08/02/2019 05/15/2019 11/15/2018 10/14/2018 07/15/2018  Falls in the past year? 0  0 0 0 0  Number falls in past yr: 0 0 0 0 0  Injury with Fall? 0 0 0 0 0  Risk for fall due to : - - - - -  Risk for fall due to: Comment - - - - -  Follow up - - - Falls prevention discussed -      Assessment & Plan  1. Type 1 diabetes mellitus with hyperglycemia, with long-term current use of insulin (HCC)  She is under the care of Dr. O'Connell reminded caregivers not to have diabetes labs in our office  2. Benign hypertension  We will adjust dose of norvasc from 2.5 to 5 mg and recheck in about one month  - amLODipine (NORVASC) 5 MG tablet; Take 1 tablet (5 mg total) by mouth daily.  Dispense: 90 tablet; Refill: 0  3. Age-related osteoporosis without current pathological fracture  - alendronate (FOSAMAX) 70 MG tablet; Take 1 tablet (70 mg total) by mouth every 7 (seven) days. Take with a full glass of water on an empty stomach.  Dispense: 12 tablet; Refill: 1  4. Effusion of right knee  - Ambulatory referral to Orthopedic Surgery 

## 2019-08-08 ENCOUNTER — Other Ambulatory Visit: Payer: Self-pay | Admitting: Family Medicine

## 2019-08-08 DIAGNOSIS — I10 Essential (primary) hypertension: Secondary | ICD-10-CM

## 2019-08-08 NOTE — Telephone Encounter (Signed)
Requested Prescriptions  Pending Prescriptions Disp Refills  . losartan-hydrochlorothiazide (HYZAAR) 100-12.5 MG tablet [Pharmacy Med Name: LOSARTAN-HCTZ 100-12.5 MG TAB] 90 tablet 1    Sig: TAKE 1 TABLET BY MOUTH EVERY DAY     Cardiovascular: ARB + Diuretic Combos Failed - 08/08/2019  6:59 PM      Failed - Last BP in normal range    BP Readings from Last 1 Encounters:  08/02/19 (!) 150/70         Passed - K in normal range and within 180 days    Potassium  Date Value Ref Range Status  05/15/2019 3.6 3.5 - 5.3 mmol/L Final  02/25/2013 5.0 3.5 - 5.1 mmol/L Final         Passed - Na in normal range and within 180 days    Sodium  Date Value Ref Range Status  05/15/2019 140 135 - 146 mmol/L Final  02/25/2013 130 (L) 136 - 145 mmol/L Final         Passed - Cr in normal range and within 180 days    Creat  Date Value Ref Range Status  05/15/2019 0.61 0.60 - 0.93 mg/dL Final    Comment:    For patients >14 years of age, the reference limit for Creatinine is approximately 13% higher for people identified as African-American. .    Creatinine, Urine  Date Value Ref Range Status  03/14/2018 176 20 - 275 mg/dL Final         Passed - Ca in normal range and within 180 days    Calcium  Date Value Ref Range Status  05/15/2019 10.2 8.6 - 10.4 mg/dL Final   Calcium, Total  Date Value Ref Range Status  02/25/2013 10.3 (H) 8.5 - 10.1 mg/dL Final         Passed - Patient is not pregnant      Passed - Valid encounter within last 6 months    Recent Outpatient Visits          6 days ago Type 1 diabetes mellitus with hyperglycemia, with long-term current use of insulin [facility])   [facility] [place], Drue Stager, MD   2 months ago Type 1 diabetes mellitus with microalbuminuria [facility])   [facility] [place], Drue Stager, MD   8 months ago Type 1 diabetes mellitus with microalbuminuria [facility])   [facility] Steele Sizer, MD   1 year ago  Age-related osteoporosis without current pathological fracture   [facility] Steele Sizer, MD   1 year ago Type 1 diabetes mellitus with microalbuminuria [facility])   [facility] Steele Sizer, MD      Future Appointments            In 1 month Ancil Boozer, Drue Stager, MD [facility] [place]   In 2 months  [place]

## 2019-08-21 DIAGNOSIS — M1711 Unilateral primary osteoarthritis, right knee: Secondary | ICD-10-CM | POA: Diagnosis not present

## 2019-08-21 DIAGNOSIS — M25461 Effusion, right knee: Secondary | ICD-10-CM | POA: Diagnosis not present

## 2019-08-21 DIAGNOSIS — M25561 Pain in right knee: Secondary | ICD-10-CM | POA: Diagnosis not present

## 2019-08-25 ENCOUNTER — Other Ambulatory Visit: Payer: Self-pay

## 2019-08-25 DIAGNOSIS — E1159 Type 2 diabetes mellitus with other circulatory complications: Secondary | ICD-10-CM

## 2019-08-25 MED ORDER — HYDRALAZINE HCL 10 MG PO TABS: 10.0000 mg | ORAL_TABLET | Freq: Three times a day (TID) | ORAL | 1 refills | Status: DC | PRN

## 2019-09-08 ENCOUNTER — Other Ambulatory Visit: Payer: Self-pay

## 2019-09-08 ENCOUNTER — Other Ambulatory Visit: Payer: Self-pay | Admitting: Family Medicine

## 2019-09-08 ENCOUNTER — Encounter: Payer: Self-pay | Admitting: Family Medicine

## 2019-09-08 ENCOUNTER — Ambulatory Visit (INDEPENDENT_AMBULATORY_CARE_PROVIDER_SITE_OTHER): Payer: Medicare Other | Admitting: Family Medicine

## 2019-09-08 VITALS — BP 138/68 | HR 82 | Temp 97.9°F | Resp 14 | Ht 63.0 in | Wt 130.5 lb

## 2019-09-08 DIAGNOSIS — I10 Essential (primary) hypertension: Secondary | ICD-10-CM | POA: Diagnosis not present

## 2019-09-08 DIAGNOSIS — R2681 Unsteadiness on feet: Secondary | ICD-10-CM

## 2019-09-08 DIAGNOSIS — Z1159 Encounter for screening for other viral diseases: Secondary | ICD-10-CM

## 2019-09-08 DIAGNOSIS — I152 Hypertension secondary to endocrine disorders: Secondary | ICD-10-CM

## 2019-09-08 DIAGNOSIS — M810 Age-related osteoporosis without current pathological fracture: Secondary | ICD-10-CM

## 2019-09-08 DIAGNOSIS — G3184 Mild cognitive impairment, so stated: Secondary | ICD-10-CM

## 2019-09-08 DIAGNOSIS — R809 Proteinuria, unspecified: Secondary | ICD-10-CM | POA: Diagnosis not present

## 2019-09-08 DIAGNOSIS — E559 Vitamin D deficiency, unspecified: Secondary | ICD-10-CM

## 2019-09-08 DIAGNOSIS — E1065 Type 1 diabetes mellitus with hyperglycemia: Secondary | ICD-10-CM

## 2019-09-08 DIAGNOSIS — E785 Hyperlipidemia, unspecified: Secondary | ICD-10-CM

## 2019-09-08 DIAGNOSIS — E1159 Type 2 diabetes mellitus with other circulatory complications: Secondary | ICD-10-CM

## 2019-09-08 DIAGNOSIS — I700 Atherosclerosis of aorta: Secondary | ICD-10-CM

## 2019-09-08 DIAGNOSIS — E1029 Type 1 diabetes mellitus with other diabetic kidney complication: Secondary | ICD-10-CM | POA: Diagnosis not present

## 2019-09-08 LAB — GLUCOSE, POCT (MANUAL RESULT ENTRY): POC Glucose: 251 mg/dl — AB (ref 70–99)

## 2019-09-08 MED ORDER — ATORVASTATIN CALCIUM 80 MG PO TABS: 80.0000 mg | ORAL_TABLET | Freq: Every day | ORAL | 1 refills | Status: DC

## 2019-09-08 MED ORDER — AMLODIPINE BESYLATE 5 MG PO TABS: 5.0000 mg | ORAL_TABLET | Freq: Every day | ORAL | 1 refills | Status: DC

## 2019-09-08 MED ORDER — GLUCAGON EMERGENCY 1 MG IJ KIT: 1.0000 | PACK | Freq: Every day | INTRAMUSCULAR | 0 refills | Status: DC | PRN

## 2019-09-08 NOTE — Progress Notes (Signed)
Name: [name]   MRN: [id number]    DOB: Jan 18, 1941   Date:09/08/2019       Progress Note  Subjective  Chief Complaint  Chief Complaint  Patient presents with  . Follow-up  . Diabetes    HPI  Osteoporosis: found on bone density09/02/2017, -2.4 left hip and -3.2 on L1-2 spine ( lower levels excluded because of DDD). She denies any pain, trying to eat a high calcium and high vitamin D diet, also taking otc calcium plus D, no history of fractures in the past. No family history of osteoporosis. Husband stopped giving medication to her in 2020 but back on medication since Spring of 2021   HTN: since last visit in May bp has been better controlled, she is on Norvasc 5 mg, losartan hctz 100/12.5  very seldom needs to take hydralazine about once a week when bp goes above 150/90 . She denies chest pain, dizziness or SOB  Hyperlipidemia:sheis tolerating atorvastatin, no myalgia.She has atherosclerosis of Aorta, reviewed last labs   JJH:ERDE to seeDr. Jamesetta So under the care of Dr. Honor Junes. Husband forgot to remind CMA she gets A 1 C done at his office todayis well controlled at 7.6% at Dr. Honor Junes three months ago was 7.5%She is on Novolog Mix 70/30 30units in am,10units at night, taking statin, aspirin and ARB also, she has history of proteinuria. A1C today was 8.5% , reviewed her glucose records and it goes from 70-300's  She has follow up with Dr. Manfred Shirts next week . Husband said one day this week her glucose dropped to 47, he told me he is giving Novolog before bed not before dinner. Advised to give to her prior to dinner.   Mild Cognitive Impairment: she has been getting worse, she needs more bathing and dressing lately, husband is primary caregiver,  She also needs helps with finances and taking medication. Family has noticed a decline in the past few months . She has seen Dr. Manuella Ghazi ( neurologist in the past - and per husband she was diagnosed with microvascular  dementia ).Unchanged. She also has some balanced problems.   Patient Active Problem List   Diagnosis Date Noted  . Type 1 diabetes mellitus with hyperglycemia, with long-term current use of insulin ([place]) 04/28/2018  . Atherosclerosis of aorta ([place]) 04/01/2016  . Aortic sclerosis 09/19/2014  . Benign hypertension 09/19/2014  . Calculus of gallbladder 09/19/2014  . Carpal tunnel syndrome 09/19/2014  . Detached retina 09/19/2014  . Type 1 diabetes mellitus with microalbuminuria ([place]) 09/19/2014  . Dyslipidemia 09/19/2014  . Hypophosphatemia 09/19/2014  . Mild cognitive impairment with memory loss 09/19/2014  . Microalbuminuria 09/19/2014  . OP (osteoporosis) 09/19/2014  . Menopause 09/19/2014  . Ptosis of eyelid 09/19/2014  . Hypoglycemia associated with diabetes ([place]) 10/24/2013  . Urge incontinence 08/01/2013  . Hyperlipidemia due to type 1 diabetes mellitus ([place]) 08/01/2013  . Vitamin D deficiency 02/24/2007    Past Surgical History:  Procedure Laterality Date  . BREAST EXCISIONAL BIOPSY Left    neg  . BREAST SURGERY  1970  . EYE SURGERY      Family History  Problem Relation Age of Onset  . Alzheimer's disease Mother   . Diabetes Mother   . Arthritis Mother   . Diabetes Father   . Hypertension Father   . Hyperlipidemia Father   . Stroke Brother   . Multiple sclerosis Daughter     Social History   Tobacco Use  . Smoking status: Never Smoker  . Smokeless  tobacco: Never Used  . Tobacco comment: smoking cessation materials not required  Substance Use Topics  . Alcohol use: No    Alcohol/week: 0.0 standard drinks     Current Outpatient Medications:  .  ACCU-CHEK AVIVA PLUS test strip, USE 1 STRIP UP TO 4 TIMES DAILY, Disp: 100 each, Rfl: 3 .  alendronate (FOSAMAX) 70 MG tablet, Take 1 tablet (70 mg total) by mouth every 7 (seven) days. Take with a full glass of water on an empty stomach., Disp: 12 tablet, Rfl: 1 .  amLODipine (NORVASC) 5 MG tablet, Take 1  tablet (5 mg total) by mouth daily., Disp: 90 tablet, Rfl: 0 .  aspirin EC 81 MG tablet, Take 81 mg by mouth daily., Disp: , Rfl:  .  atorvastatin (LIPITOR) 80 MG tablet, TAKE 1 TABLET BY MOUTH EVERY DAY, Disp: 90 tablet, Rfl: 1 .  blood glucose meter kit and supplies, , Disp: , Rfl:  .  Calcium Carbonate-Vitamin D (CALCIUM-VITAMIN D) 600-125 MG-UNIT TABS, Take 1 tablet by mouth daily., Disp: , Rfl:  .  [employer] ALCOHOL SWABS PADS, USE 4 TIMES DAILY TO WIPE SKIN BEFORE USING INSULIN, Disp: 400 each, Rfl: 2 .  insulin aspart protamine- aspart (NOVOLOG MIX 70/30) (70-30) 100 UNIT/ML injection, Pt taking 30 units in AM and 10 units before bed only per Dr. Paulla Fore, Disp: , Rfl:  .  losartan-hydrochlorothiazide (HYZAAR) 100-12.5 MG tablet, TAKE 1 TABLET BY MOUTH EVERY DAY, Disp: 90 tablet, Rfl: 1 .  ondansetron (ZOFRAN ODT) 4 MG disintegrating tablet, Take 1 tablet (4 mg total) by mouth every 8 (eight) hours as needed for nausea or vomiting., Disp: 20 tablet, Rfl: 0 .  Glucagon, rDNA, (GLUCAGON EMERGENCY) 1 MG KIT, Inject 1 kit as directed daily as needed. (Patient not taking: Reported on 09/08/2019), Disp: 1 kit, Rfl: 0 .  hydrALAZINE (APRESOLINE) 10 MG tablet, TAKE 1 TABLET (10 MG TOTAL) BY MOUTH 3 (THREE) TIMES DAILY AS NEEDED. BP ABOVE 150/90, Disp: 270 tablet, Rfl: 0  Allergies  Allergen Reactions  . Colesevelam Hcl     scotomas  . Daucus [place]ota   . Penicillins Itching    I personally reviewed active problem list, medication list, allergies, family history, social history, health maintenance with the patient/caregiver today.   ROS  Constitutional: Negative for fever or weight change.  Respiratory: Negative for cough and shortness of breath.   Cardiovascular: Negative for chest pain or palpitations.  Gastrointestinal: Negative for abdominal pain, no bowel changes.  Musculoskeletal: Negative for gait problem or joint swelling.  Skin: Negative for rash.  Neurological: Negative for dizziness or  headache.  No other specific complaints in a complete review of systems (except as listed in HPI above).  Objective  Vitals:   09/08/19 1346  BP: 138/68  Pulse: 82  Resp: 14  Temp: 97.9 F (36.6 C)  TempSrc: Temporal  SpO2: 98%  Weight: 130 lb 8 oz (59.2 kg)  Height: _0  (1.6 m)    Body mass index is 23.12 kg/m.  Physical Exam  Constitutional: Patient appears well-developed and well-nourished.  No distress.  HEENT: head atraumatic, normocephalic, pupils equal and reactive to light,  neck supple Cardiovascular: Normal rate, regular rhythm and normal heart sounds.  No murmur heard. No BLE edema. Pulmonary/Chest: Effort normal and breath sounds normal. No respiratory distress. Abdominal: Soft.  There is no tenderness. Muscular Skeletal;: uses walker at home, today she used her husband for support  Psychiatric: Patient has a normal mood and affect. behavior  is normal. Judgment and thought content normal.  PHQ2/9: Depression screen [facility] 2/9 09/08/2019 08/02/2019 05/15/2019 11/15/2018 10/14/2018  Decreased Interest 0 0 0 0 0  Down, Depressed, Hopeless 0 0 0 0 0  PHQ - 2 Score 0 0 0 0 0  Altered sleeping 0 0 0 0 -  Tired, decreased energy 0 0 0 0 -  Change in appetite 0 0 0 0 -  Feeling bad or failure about yourself  0 0 0 0 -  Trouble concentrating 0 0 0 0 -  Moving slowly or fidgety/restless 0 0 0 0 -  Suicidal thoughts 0 0 0 0 -  PHQ-9 Score 0 0 0 0 -  Difficult doing work/chores - - - - -    phq 9 is negative   Fall Risk: Fall Risk  09/08/2019 08/02/2019 05/15/2019 11/15/2018 10/14/2018  Falls in the past year? 0 0 0 0 0  Number falls in past yr: 0 0 0 0 0  Injury with Fall? 0 0 0 0 0  Risk for fall due to : - - - - -  Risk for fall due to: Comment - - - - -  Follow up - - - - Falls prevention discussed     Functional Status Survey: Is the patient deaf or have difficulty hearing?: No Does the patient have difficulty seeing, even when wearing glasses/contacts?: No Does the  patient have difficulty concentrating, remembering, or making decisions?: No Does the patient have difficulty walking or climbing stairs?: No Does the patient have difficulty dressing or bathing?: Yes Does the patient have difficulty doing errands alone such as visiting a doctor's office or shopping?: Yes   Assessment & Plan  1. Type 1 diabetes mellitus with hyperglycemia, with long-term current use of insulin (HCC)  - POCT HgB A1C - COMPLETE METABOLIC PANEL WITH GFR - Microalbumin / creatinine urine ratio  2. Age-related osteoporosis without current pathological fracture  On alendonate   3. Benign hypertension  - amLODipine (NORVASC) 5 MG tablet; Take 1 tablet (5 mg total) by mouth daily.  Dispense: 90 tablet; Refill: 1  4. Atherosclerosis of aorta (HCC)  - Lipid panel - atorvastatin (LIPITOR) 80 MG tablet; Take 1 tablet (80 mg total) by mouth daily.  Dispense: 90 tablet; Refill: 1  5. Type 1 diabetes mellitus with microalbuminuria (HCC)  - Glucagon, rDNA, (GLUCAGON EMERGENCY) 1 MG KIT; Inject 1 kit as directed daily as needed.  Dispense: 1 kit; Refill: 0  6. Mild cognitive impairment with memory loss   7. Gait instability  Had home PT still doing home exercise   8. Hypertension associated with diabetes ([place])  bp is at goal   9. Vitamin D deficiency  - VITAMIN D 25 Hydroxy (Vit-D Deficiency, Fractures)  10. Microalbuminuria  - Microalbumin / creatinine urine ratio  11. Need for hepatitis C screening test  - Hepatitis C antibody  12. Dyslipidemia  - atorvastatin (LIPITOR) 80 MG tablet; Take 1 tablet (80 mg total) by mouth daily.  Dispense: 90 tablet; Refill: 1

## 2019-09-12 LAB — COMPLETE METABOLIC PANEL WITH GFR
AG Ratio: 1.2 (calc) (ref 1.0–2.5)
ALT: 19 U/L (ref 6–29)
AST: 20 U/L (ref 10–35)
Albumin: 4 g/dL (ref 3.6–5.1)
Alkaline phosphatase (APISO): 61 U/L (ref 37–153)
BUN/Creatinine Ratio: 31 (calc) — ABNORMAL HIGH (ref 6–22)
BUN: 29 mg/dL — ABNORMAL HIGH (ref 7–25)
CO2: 28 mmol/L (ref 20–32)
Calcium: 9.6 mg/dL (ref 8.6–10.4)
Chloride: 101 mmol/L (ref 98–110)
Creat: 0.95 mg/dL — ABNORMAL HIGH (ref 0.60–0.93)
GFR, Est African American: 67 mL/min/{1.73_m2} (ref >=60)
GFR, Est Non African American: 58 mL/min/{1.73_m2} — ABNORMAL LOW (ref >=60)
Globulin: 3.3 g/dL (calc) (ref 1.9–3.7)
Glucose, Bld: 268 mg/dL — ABNORMAL HIGH (ref 65–99)
Potassium: 4 mmol/L (ref 3.5–5.3)
Sodium: 137 mmol/L (ref 135–146)
Total Bilirubin: 0.6 mg/dL (ref 0.2–1.2)
Total Protein: 7.3 g/dL (ref 6.1–8.1)

## 2019-09-12 LAB — LIPID PANEL
Cholesterol: 154 mg/dL (ref <200)
HDL: 47 mg/dL — ABNORMAL LOW (ref >=50)
LDL Cholesterol (Calc): 88 mg/dL (calc)
Non-HDL Cholesterol (Calc): 107 mg/dL (calc) (ref <130)
Total CHOL/HDL Ratio: 3.3 (calc) (ref <5.0)
Triglycerides: 96 mg/dL (ref <150)

## 2019-09-12 LAB — HEPATITIS C ANTIBODY
Hepatitis C Ab: NONREACTIVE
SIGNAL TO CUT-OFF: 0.04 (ref <1.00)

## 2019-09-12 LAB — VITAMIN D 25 HYDROXY (VIT D DEFICIENCY, FRACTURES): Vit D, 25-Hydroxy: 27 ng/mL — ABNORMAL LOW (ref 30–100)

## 2019-10-18 DIAGNOSIS — E1069 Type 1 diabetes mellitus with other specified complication: Secondary | ICD-10-CM | POA: Diagnosis not present

## 2019-10-18 DIAGNOSIS — E1065 Type 1 diabetes mellitus with hyperglycemia: Secondary | ICD-10-CM | POA: Diagnosis not present

## 2019-10-18 DIAGNOSIS — M810 Age-related osteoporosis without current pathological fracture: Secondary | ICD-10-CM | POA: Diagnosis not present

## 2019-10-18 DIAGNOSIS — I152 Hypertension secondary to endocrine disorders: Secondary | ICD-10-CM | POA: Diagnosis not present

## 2019-10-18 DIAGNOSIS — E785 Hyperlipidemia, unspecified: Secondary | ICD-10-CM | POA: Diagnosis not present

## 2019-10-18 DIAGNOSIS — E1159 Type 2 diabetes mellitus with other circulatory complications: Secondary | ICD-10-CM | POA: Diagnosis not present

## 2019-10-19 ENCOUNTER — Ambulatory Visit: Payer: Medicare Other

## 2019-11-02 ENCOUNTER — Other Ambulatory Visit: Payer: Self-pay

## 2019-11-02 ENCOUNTER — Ambulatory Visit: Payer: Medicare Other | Admitting: Pharmacist

## 2019-11-02 DIAGNOSIS — R809 Proteinuria, unspecified: Secondary | ICD-10-CM | POA: Diagnosis not present

## 2019-11-02 DIAGNOSIS — E1029 Type 1 diabetes mellitus with other diabetic kidney complication: Secondary | ICD-10-CM | POA: Diagnosis not present

## 2019-11-02 DIAGNOSIS — M810 Age-related osteoporosis without current pathological fracture: Secondary | ICD-10-CM

## 2019-11-02 NOTE — Chronic Care Management (AMB) (Signed)
Chronic Care Management Pharmacy  Name: [name]  MRN: [id number] DOB: 12-01-1940  Chief Complaint/ HPI  [name],  79 y.o. , female presents for their Follow-Up CCM visit with the clinical pharmacist via telephone due to COVID-19 Pandemic.  PCP : Steele Sizer, MD  Their chronic conditions include: HTN, DM, HLD  Office Visits:NA  Consult Visit:NA  Medications: Outpatient Encounter Medications as of 11/02/2019  Medication Sig Note  . ACCU-CHEK AVIVA PLUS test strip USE 1 STRIP UP TO 4 TIMES DAILY   . alendronate (FOSAMAX) 70 MG tablet Take 1 tablet (70 mg total) by mouth every 7 (seven) days. Take with a full glass of water on an empty stomach.   Marland Kitchen amLODipine (NORVASC) 5 MG tablet Take 1 tablet (5 mg total) by mouth daily.   Marland Kitchen aspirin EC 81 MG tablet Take 81 mg by mouth daily.   Marland Kitchen atorvastatin (LIPITOR) 80 MG tablet Take 1 tablet (80 mg total) by mouth daily.   . blood glucose meter kit and supplies  09/19/2014: DX: 250.02 DM II Received from: [facility]  . Calcium Carbonate-Vitamin D (CALCIUM-VITAMIN D) 600-125 MG-UNIT TABS Take 1 tablet by mouth daily. 09/19/2014: Received from: [facility]: Take by mouth.  . [facility] ALCOHOL SWABS PADS USE 4 TIMES DAILY TO WIPE SKIN BEFORE USING INSULIN   . Glucagon, rDNA, (GLUCAGON EMERGENCY) 1 MG KIT Inject 1 kit as directed daily as needed.   . hydrALAZINE (APRESOLINE) 10 MG tablet TAKE 1 TABLET (10 MG TOTAL) BY MOUTH 3 (THREE) TIMES DAILY AS NEEDED. BP ABOVE 150/90   . insulin aspart protamine- aspart (NOVOLOG MIX 70/30) (70-30) 100 UNIT/ML injection Pt taking 30 units in AM and 10 units before bed only per Dr. Paulla Fore 09/26/2014: Received from: [facility]  . losartan-hydrochlorothiazide (HYZAAR) 100-12.5 MG tablet TAKE 1 TABLET BY MOUTH EVERY DAY   . ondansetron (ZOFRAN ODT) 4 MG disintegrating tablet Take 1 tablet (4 mg total) by mouth every 8 (eight) hours as needed  for nausea or vomiting.    No facility-administered encounter medications on file as of 11/02/2019.     Financial Resource Strain:   . Difficulty of Paying Living Expenses: Not on file     Current Diagnosis/Assessment:  Goals Addressed   None     Osteoporosis     Vit D, 25-Hydroxy  Date Value Ref Range Status  09/08/2019 27 (L) 30 - 100 ng/mL Final    Comment:    Vitamin D Status         25-OH Vitamin D: . Deficiency:                    <20 ng/mL Insufficiency:             20 - 29 ng/mL Optimal:                 > or = 30 ng/mL . For 25-OH Vitamin D testing on patients on  D2-supplementation and patients for whom quantitation  of D2 and D3 fractions is required, the QuestAssureD(TM) 25-OH VIT D, (D2,D3), LC/MS/MS is recommended: order  code [id number] (patients >103yr). See Note 1 . Note 1 . For additional information, please refer to  http://education.QuestDiagnostics.com/faq/FAQ199  (This link is being provided for informational/ educational purposes only.)      Patient is a candidate for pharmacologic treatment due to T-Score < -2.5 in femoral neck  Patient has failed these meds in past: NA Patient  is currently controlled on the following medications:  . Fosamax 41m weekly  We discussed:  Taking Fosamax? Yes, Sunday Tolerating Fosamax? Tolerates Eat more dairy if possible  Plan  Continue current medications  Diabetes   Recent Relevant Labs: Lab Results  Component Value Date/Time   HGBA1C 8.5 (A) 05/15/2019 01:38 PM   HGBA1C 7.6 (A) 11/15/2018 01:35 PM   HGBA1C 8.5 (A) 04/15/2018 01:22 PM   HGBA1C 9.9 12/15/2017 12:00 AM   HGBA1C 10.6 (A) 04/25/2014 12:00 AM   HGBA1C 14.0 (H) 02/26/2013 10:50 AM   HGBA1C 10.6 (H) 01/23/2012 05:35 PM   MICROALBUR 1.8 03/14/2018 04:53 PM   MICROALBUR 0.8 10/13/2016 09:36 AM    Patient is currently uncontrolled on the following medications: Novolog 70/30 mix  Last diabetic Foot exam:  Lab Results  Component  Value Date/Time   HMDIABEYEEXA Retinopathy (A) 02/01/2019 12:00 AM    Last diabetic Eye exam: No results found for: HMDIABFOOTEX   We discussed:  How much 70/30? [facility] A1c does not include insulin increase  Plan  Continue current medications

## 2019-11-04 NOTE — Patient Instructions (Addendum)
Visit Information  Goals Addressed            This Visit's Progress   . Chronic Care Management       CARE PLAN ENTRY  Current Barriers:  . Chronic Disease Management support, education, and care coordination needs related to Hypertension, Hyperlipidemia, and Diabetes   Hypertension . Pharmacist Clinical Goal(s): o Over the next 90 days, patient will work with PharmD and providers to achieve BP goal <130/80 . Current regimen:  o Amlodipine, losartan, hydrochlorothiazide, hydralazine as needed BP > 150/90 . Interventions: o None . Patient self care activities - Over the next 90 days, patient will: o Check BP daily, document, and provide at future appointments o Be sure not to use both clonidine and hydralazine for blood pressure spikes o Ensure daily salt intake < 2300 mg/day  Hyperlipidemia . Pharmacist Clinical Goal(s): o Over the next 90 days, patient will work with PharmD and providers to achieve LDL goal < 70 . Current regimen:  o Atorvastatin 41m daily . Interventions: o None . Patient self care activities - Over the next 90 days, patient will: o Continue to improve diet o Ensure atorvastatin taken daily  Diabetes . Pharmacist Clinical Goal(s): o Over the next 90 days, patient will work with PharmD and providers to achieve A1c goal <7% . Current regimen:  o Novolog 70/30 45 units in the morning . Interventions: o None . Patient self care activities - Over the next 90 days, patient will: o Check blood sugar once daily, document, and provide at future appointments o Contact provider with any episodes of hypoglycemia o Continue to improve diet  Medication management . Pharmacist Clinical Goal(s): o Over the next 90 days, patient will work with PharmD and providers to achieve optimal medication adherence . [facility] . Interventions o Comprehensive medication review performed. o Continue current medication management strategy . Patient self care  activities - Over the next 90 days, patient will: o Focus on medication adherence by asking Clinical Pharmacy for clarification when in doubt o Take medications as prescribed o Report any questions or concerns to PharmD and/or provider(s)  Initial goal documentation        Print copy of patient instructions provided.   Telephone follow up appointment with pharmacy team member scheduled for: 3 months  TMilus Height PharmD, [phone] Osteoporosis  Osteoporosis happens when your bones get thin and weak. This can cause your bones to break (fracture) more easily. You can do things at home to make your bones stronger. Follow these instructions at home:  Activity  Exercise as told by your doctor. Ask your doctor what activities are safe for you. You should do: ? Exercises that make your muscles work to hold your body weight up (weight-bearing exercises). These include tai chi, yoga, and walking. ? Exercises to make your muscles stronger. One example is lifting weights. Lifestyle  Limit alcohol intake to no more than 1 drink a day for nonpregnant women and 2 drinks a day for men. One drink equals 12 oz of beer, 5 oz of wine, or 1 oz of hard liquor.  Do not use any products that have nicotine or tobacco in them. These include cigarettes and e-cigarettes. If you need help quitting, ask your doctor. Preventing falls  Use tools to help you move around (mobility aids) as needed. These include canes, walkers, scooters, and crutches.  Keep rooms well-lit and free of clutter.  Put away things  that could make you trip. These include cords and rugs.  Install safety rails on stairs. Install grab bars in bathrooms.  Use rubber mats in slippery areas, like bathrooms.  Wear shoes that: ? Fit you well. ? Support your feet. ? Have closed toes. ? Have rubber soles or low heels.  Tell your doctor about all of the medicines you  are taking. Some medicines can make you more likely to fall. General instructions  Eat plenty of calcium and vitamin D. These nutrients are good for your bones. Good sources of calcium and vitamin D include: ? Some fatty fish, such as salmon and tuna. ? Foods that have calcium and vitamin D added to them (fortified foods). For example, some breakfast cereals are fortified with calcium and vitamin D. ? Egg yolks. ? Cheese. ? Liver.  Take over-the-counter and prescription medicines only as told by your doctor.  Keep all follow-up visits as told by your doctor. This is important. Contact a doctor if:  You have not been tested (screened) for osteoporosis and you are: ? A woman who is age 69 or older. ? A man who is age 52 or older. Get help right away if:  You fall.  You get hurt. Summary  Osteoporosis happens when your bones get thin and weak.  Weak bones can break (fracture) more easily.  Eat plenty of calcium and vitamin D. These nutrients are good for your bones.  Tell your doctor about all of the medicines that you take. This information is not intended to replace advice given to you by your health care provider. Make sure you discuss any questions you have with your health care provider. Document Revised: 02/05/2017 Document Reviewed: 12/18/2016 Elsevier Patient Education  2020 [employer].

## 2019-11-29 ENCOUNTER — Telehealth: Payer: Self-pay | Admitting: Family Medicine

## 2019-11-29 NOTE — Telephone Encounter (Signed)
Patient declined the Medicare Wellness Visit with [facility]  Husband declined due to wife's dementia

## 2019-12-21 ENCOUNTER — Other Ambulatory Visit: Payer: Self-pay

## 2019-12-21 ENCOUNTER — Ambulatory Visit (INDEPENDENT_AMBULATORY_CARE_PROVIDER_SITE_OTHER): Payer: Medicare Other

## 2019-12-21 DIAGNOSIS — Z23 Encounter for immunization: Secondary | ICD-10-CM

## 2020-01-09 ENCOUNTER — Ambulatory Visit: Payer: Medicare Other | Admitting: Family Medicine

## 2020-01-10 ENCOUNTER — Other Ambulatory Visit: Payer: Self-pay | Admitting: Family Medicine

## 2020-01-10 DIAGNOSIS — M810 Age-related osteoporosis without current pathological fracture: Secondary | ICD-10-CM

## 2020-01-13 ENCOUNTER — Other Ambulatory Visit: Payer: Self-pay | Admitting: Family Medicine

## 2020-01-13 DIAGNOSIS — M810 Age-related osteoporosis without current pathological fracture: Secondary | ICD-10-CM

## 2020-01-13 NOTE — Telephone Encounter (Signed)
Requested Prescriptions  Pending Prescriptions Disp Refills  . alendronate (FOSAMAX) 70 MG tablet [Pharmacy Med Name: ALENDRONATE SODIUM 70 MG TAB] 12 tablet 1    Sig: TAKE 1 TABLET BY MOUTH EVERY 7 DAYS. TAKE WITH A FULL GLASS OF WATER ON AN EMPTY STOMACH.     Endocrinology:  Bisphosphonates Failed - 01/13/2020  9:03 AM      Failed - Vitamin D in normal range and within 360 days    Vit D, 25-Hydroxy  Date Value Ref Range Status  09/08/2019 27 (L) 30 - 100 ng/mL Final    Comment:    Vitamin D Status         25-OH Vitamin D: . Deficiency:                    <20 ng/mL Insufficiency:             20 - 29 ng/mL Optimal:                 > or = 30 ng/mL . For 25-OH Vitamin D testing on patients on  D2-supplementation and patients for whom quantitation  of D2 and D3 fractions is required, the QuestAssureD(TM) 25-OH VIT D, (D2,D3), LC/MS/MS is recommended: order  code [id number] (patients >43yrs). See Note 1 . Note 1 . For additional information, please refer to  http://education.QuestDiagnostics.com/faq/FAQ199  (This link is being provided for informational/ educational purposes only.)          Passed - Ca in normal range and within 360 days    Calcium  Date Value Ref Range Status  09/08/2019 9.6 8.6 - 10.4 mg/dL Final   Calcium, Total  Date Value Ref Range Status  02/25/2013 10.3 (H) 8.5 - 10.1 mg/dL Final         Passed - Valid encounter within last 12 months    Recent Outpatient Visits          4 months ago Type 1 diabetes mellitus with hyperglycemia, with long-term current use of insulin [facility])   [facility] [place], Drue Stager, MD   5 months ago Type 1 diabetes mellitus with hyperglycemia, with long-term current use of insulin [facility])   [facility] [place], Drue Stager, MD   8 months ago Type 1 diabetes mellitus with microalbuminuria [facility])   [facility] Steele Sizer, MD   1 year ago Type 1 diabetes mellitus with  microalbuminuria [facility])   [facility] Steele Sizer, MD   1 year ago Age-related osteoporosis without current pathological fracture   [facility] Steele Sizer, MD

## 2020-02-04 ENCOUNTER — Other Ambulatory Visit: Payer: Self-pay | Admitting: Family Medicine

## 2020-02-04 DIAGNOSIS — I10 Essential (primary) hypertension: Secondary | ICD-10-CM

## 2020-02-04 NOTE — Telephone Encounter (Signed)
Requested Prescriptions  Pending Prescriptions Disp Refills   losartan-hydrochlorothiazide (HYZAAR) 100-12.5 MG tablet [Pharmacy Med Name: LOSARTAN-HCTZ 100-12.5 MG TAB] 90 tablet 0    Sig: TAKE 1 TABLET BY MOUTH EVERY DAY     Cardiovascular: ARB + Diuretic Combos Failed - 02/04/2020  8:52 AM      Failed - Cr in normal range and within 180 days    Creat  Date Value Ref Range Status  09/08/2019 0.95 (H) 0.60 - 0.93 mg/dL Final    Comment:    For patients >55 years of age, the reference limit for Creatinine is approximately 13% higher for people identified as African-American. .    Creatinine, Urine  Date Value Ref Range Status  03/14/2018 176 20 - 275 mg/dL Final         Passed - K in normal range and within 180 days    Potassium  Date Value Ref Range Status  09/08/2019 4.0 3.5 - 5.3 mmol/L Final  02/25/2013 5.0 3.5 - 5.1 mmol/L Final         Passed - Na in normal range and within 180 days    Sodium  Date Value Ref Range Status  09/08/2019 137 135 - 146 mmol/L Final  02/25/2013 130 (L) 136 - 145 mmol/L Final         Passed - Ca in normal range and within 180 days    Calcium  Date Value Ref Range Status  09/08/2019 9.6 8.6 - 10.4 mg/dL Final   Calcium, Total  Date Value Ref Range Status  02/25/2013 10.3 (H) 8.5 - 10.1 mg/dL Final         Passed - Patient is not pregnant      Passed - Last BP in normal range    BP Readings from Last 1 Encounters:  09/08/19 138/68         Passed - Valid encounter within last 6 months    Recent Outpatient Visits          4 months ago Type 1 diabetes mellitus with hyperglycemia, with long-term current use of insulin [facility])   [facility] [place], Drue Stager, MD   6 months ago Type 1 diabetes mellitus with hyperglycemia, with long-term current use of insulin [facility])   [facility] [place], Drue Stager, MD   8 months ago Type 1 diabetes mellitus with microalbuminuria [facility])   [place] Steele Sizer, MD   1 year ago Type 1 diabetes mellitus with microalbuminuria [facility])   [facility] Steele Sizer, MD   1 year ago Age-related osteoporosis without current pathological fracture   [facility] Steele Sizer, MD

## 2020-02-08 ENCOUNTER — Telehealth: Payer: Self-pay

## 2020-02-08 DIAGNOSIS — E113593 Type 2 diabetes mellitus with proliferative diabetic retinopathy without macular edema, bilateral: Secondary | ICD-10-CM | POA: Diagnosis not present

## 2020-02-08 LAB — HM DIABETES EYE EXAM

## 2020-02-21 DIAGNOSIS — E785 Hyperlipidemia, unspecified: Secondary | ICD-10-CM | POA: Diagnosis not present

## 2020-02-21 DIAGNOSIS — M810 Age-related osteoporosis without current pathological fracture: Secondary | ICD-10-CM | POA: Diagnosis not present

## 2020-02-21 DIAGNOSIS — E1065 Type 1 diabetes mellitus with hyperglycemia: Secondary | ICD-10-CM | POA: Diagnosis not present

## 2020-02-21 DIAGNOSIS — E1159 Type 2 diabetes mellitus with other circulatory complications: Secondary | ICD-10-CM | POA: Diagnosis not present

## 2020-02-21 DIAGNOSIS — E1069 Type 1 diabetes mellitus with other specified complication: Secondary | ICD-10-CM | POA: Diagnosis not present

## 2020-02-21 DIAGNOSIS — I152 Hypertension secondary to endocrine disorders: Secondary | ICD-10-CM | POA: Diagnosis not present

## 2020-02-21 LAB — HEMOGLOBIN A1C: Hemoglobin A1C: 8.6

## 2020-04-09 ENCOUNTER — Encounter: Payer: Self-pay | Admitting: Family Medicine

## 2020-04-09 ENCOUNTER — Other Ambulatory Visit: Payer: Self-pay

## 2020-04-09 ENCOUNTER — Ambulatory Visit: Payer: Medicare Other | Admitting: Family Medicine

## 2020-04-09 ENCOUNTER — Ambulatory Visit (INDEPENDENT_AMBULATORY_CARE_PROVIDER_SITE_OTHER): Payer: Medicare HMO | Admitting: Family Medicine

## 2020-04-09 VITALS — BP 136/70 | HR 86 | Temp 97.8°F | Resp 16 | Ht 63.0 in | Wt 137.0 lb

## 2020-04-09 DIAGNOSIS — R809 Proteinuria, unspecified: Secondary | ICD-10-CM

## 2020-04-09 DIAGNOSIS — E1159 Type 2 diabetes mellitus with other circulatory complications: Secondary | ICD-10-CM | POA: Diagnosis not present

## 2020-04-09 DIAGNOSIS — E1065 Type 1 diabetes mellitus with hyperglycemia: Secondary | ICD-10-CM | POA: Diagnosis not present

## 2020-04-09 DIAGNOSIS — I700 Atherosclerosis of aorta: Secondary | ICD-10-CM

## 2020-04-09 DIAGNOSIS — I152 Hypertension secondary to endocrine disorders: Secondary | ICD-10-CM | POA: Diagnosis not present

## 2020-04-09 DIAGNOSIS — G3184 Mild cognitive impairment, so stated: Secondary | ICD-10-CM

## 2020-04-09 DIAGNOSIS — M7502 Adhesive capsulitis of left shoulder: Secondary | ICD-10-CM | POA: Diagnosis not present

## 2020-04-09 DIAGNOSIS — E1029 Type 1 diabetes mellitus with other diabetic kidney complication: Secondary | ICD-10-CM | POA: Diagnosis not present

## 2020-04-09 DIAGNOSIS — R2681 Unsteadiness on feet: Secondary | ICD-10-CM | POA: Diagnosis not present

## 2020-04-09 DIAGNOSIS — M7501 Adhesive capsulitis of right shoulder: Secondary | ICD-10-CM

## 2020-04-09 NOTE — Progress Notes (Signed)
Name: [name]   MRN: [id number]    DOB: 12/06/1940   Date:04/09/2020       Progress Note  Subjective  Chief Complaint  Increased Weakness  HPI  [name] came in today with husband and daughter. Her husband no longer drives, so daughter had to assist with transportation.  [name] lives with her husband and grandson ( in his 66's but works full time) . Husband is the primary caregiver and he is also getting older and frail.  She needs assistance changing, bathing, preparing meals, taking medications and walking. It is getting very challenging for husband to provide adequate care, but they are not interested at placement at this time  She has been getting more dependent, and about one week ago developed severe shoulder pain - tender to touch, she is better today. She does not use arms much ( does not comb her hair...), mostly to eat.   Diabetes type I: glucose still in the 200's range. She is seeing Dr. Honor Junes, last A1C was 8.6 %, he adjusted dose of insulin but does not seem to be making a difference. She did have one episode of hypoglycemia -60 a couple of weeks ago. They have glucagon but did not use it  HTN: bp goes up and down, taking medications , no chest pain or palpitation  Atherosclerosis of aorta: taking high dose Atorvastatin   Patient Active Problem List   Diagnosis Date Noted  . Type 1 diabetes mellitus with hyperglycemia, with long-term current use of insulin ([place]) 04/28/2018  . Atherosclerosis of aorta ([place]) 04/01/2016  . Aortic sclerosis 09/19/2014  . Benign hypertension 09/19/2014  . Calculus of gallbladder 09/19/2014  . Carpal tunnel syndrome 09/19/2014  . Detached retina 09/19/2014  . Type 1 diabetes mellitus with microalbuminuria ([place]) 09/19/2014  . Dyslipidemia 09/19/2014  . Hypophosphatemia 09/19/2014  . Mild cognitive impairment with memory loss 09/19/2014  . Microalbuminuria 09/19/2014  . OP (osteoporosis) 09/19/2014  . Menopause 09/19/2014  . Ptosis  of eyelid 09/19/2014  . Hypoglycemia associated with diabetes ([place]) 10/24/2013  . Urge incontinence 08/01/2013  . Hyperlipidemia due to type 1 diabetes mellitus ([place]) 08/01/2013  . Vitamin D deficiency 02/24/2007    Past Surgical History:  Procedure Laterality Date  . BREAST EXCISIONAL BIOPSY Left    neg  . BREAST SURGERY  1970  . EYE SURGERY      Family History  Problem Relation Age of Onset  . Alzheimer's disease Mother   . Diabetes Mother   . Arthritis Mother   . Diabetes Father   . Hypertension Father   . Hyperlipidemia Father   . Stroke Brother   . Multiple sclerosis Daughter     Social History   Tobacco Use  . Smoking status: Never Smoker  . Smokeless tobacco: Never Used  . Tobacco comment: smoking cessation materials not required  Substance Use Topics  . Alcohol use: No    Alcohol/week: 0.0 standard drinks     Current Outpatient Medications:  .  ACCU-CHEK AVIVA PLUS test strip, USE 1 STRIP UP TO 4 TIMES DAILY, Disp: 100 each, Rfl: 3 .  alendronate (FOSAMAX) 70 MG tablet, TAKE 1 TABLET BY MOUTH EVERY 7 DAYS. TAKE WITH A FULL GLASS OF WATER ON AN EMPTY STOMACH., Disp: 12 tablet, Rfl: 1 .  amLODipine (NORVASC) 5 MG tablet, Take 1 tablet (5 mg total) by mouth daily., Disp: 90 tablet, Rfl: 1 .  aspirin EC 81 MG tablet, Take 81 mg by mouth daily., Disp: , Rfl:  .  atorvastatin (LIPITOR) 80 MG tablet, Take 1 tablet (80 mg total) by mouth daily., Disp: 90 tablet, Rfl: 1 .  blood glucose meter kit and supplies, , Disp: , Rfl:  .  Calcium Carbonate-Vitamin D (CALCIUM-VITAMIN D) 600-125 MG-UNIT TABS, Take 1 tablet by mouth daily., Disp: , Rfl:  .  [employer] ALCOHOL SWABS PADS, USE 4 TIMES DAILY TO WIPE SKIN BEFORE USING INSULIN, Disp: 400 each, Rfl: 2 .  Glucagon, rDNA, (GLUCAGON EMERGENCY) 1 MG KIT, Inject 1 kit as directed daily as needed., Disp: 1 kit, Rfl: 0 .  hydrALAZINE (APRESOLINE) 10 MG tablet, TAKE 1 TABLET (10 MG TOTAL) BY MOUTH 3 (THREE) TIMES DAILY AS NEEDED. BP  ABOVE 150/90, Disp: 270 tablet, Rfl: 0 .  insulin aspart protamine- aspart (NOVOLOG MIX 70/30) (70-30) 100 UNIT/ML injection, 45 Units daily with breakfast. Pt taking 30 units in AM and 10 units before bed only per Dr. Paulla Fore, Disp: , Rfl:  .  losartan-hydrochlorothiazide (HYZAAR) 100-12.5 MG tablet, TAKE 1 TABLET BY MOUTH EVERY DAY, Disp: 90 tablet, Rfl: 0 .  ondansetron (ZOFRAN ODT) 4 MG disintegrating tablet, Take 1 tablet (4 mg total) by mouth every 8 (eight) hours as needed for nausea or vomiting., Disp: 20 tablet, Rfl: 0  Allergies  Allergen Reactions  . Colesevelam Hcl     scotomas  . Daucus [place]   . Penicillins Itching    I personally reviewed active problem list, medication list, allergies, family history, social history with the patient/caregiver today.   ROS  Ten systems reviewed and is negative except as mentioned in HPI   Objective  Vitals:   04/09/20 1548  BP: 136/70  Pulse: 86  Resp: 16  Temp: 97.8 F (36.6 C)  TempSrc: Oral  SpO2: 96%  Weight: 137 lb (62.1 kg)  Height: '5\' 3"'  (1.6 m)    Body mass index is 24.27 kg/m.  Physical Exam  Constitutional: Patient appears frail No distress.  HEENT: head atraumatic, normocephalic, pupils equal and reactive to light,  neck supple Cardiovascular: Normal rate, regular rhythm and normal heart sounds.  No murmur heard. No BLE edema. Pulmonary/Chest: Effort normal and breath sounds normal. No respiratory distress. Abdominal: Soft.  There is no tenderness. Muscular skeletal: walks with assistance, inability to abduct shoulder above 90 degrees also decrease in internal rotation Psychiatric: Calm and cooperative but most of the talking was done by husband and daughter   Recent Results (from the past 2160 hour(s))  HM DIABETES EYE EXAM     Status: Abnormal   Collection Time: 02/08/20 12:00 AM  Result Value Ref Range   HM Diabetic Eye Exam Retinopathy (A) No Retinopathy    Comment: No treatment needed/AEC   Hemoglobin A1c     Status: None   Collection Time: 02/21/20 12:00 AM  Result Value Ref Range   Hemoglobin A1C 8.6     Diabetic Foot Exam: Diabetic Foot Exam - Simple   Simple Foot Form Diabetic Foot exam was performed with the following findings: Yes 04/09/2020  4:09 PM  Visual Inspection See comments: Yes Sensation Testing See comments: Yes Pulse Check Posterior Tibialis and Dorsalis pulse intact bilaterally: Yes Comments She cannot locate monofilament       PHQ2/9: Depression screen [facility] 2/9 04/09/2020 09/08/2019 08/02/2019 05/15/2019 11/15/2018  Decreased Interest 0 0 0 0 0  Down, Depressed, Hopeless 0 0 0 0 0  PHQ - 2 Score 0 0 0 0 0  Altered sleeping - 0 0 0 0  Tired, decreased energy -  0 0 0 0  Change in appetite - 0 0 0 0  Feeling bad or failure about yourself  - 0 0 0 0  Trouble concentrating - 0 0 0 0  Moving slowly or fidgety/restless - 0 0 0 0  Suicidal thoughts - 0 0 0 0  PHQ-9 Score - 0 0 0 0  Difficult doing work/chores - - - - -  Some recent data might be hidden    phq 9 is negative   Fall Risk: Fall Risk  04/09/2020 09/08/2019 08/02/2019 05/15/2019 11/15/2018  Falls in the past year? 0 0 0 0 0  Number falls in past yr: 0 0 0 0 0  Injury with Fall? 0 0 0 0 0  Risk for fall due to : - - - - -  Risk for fall due to: Comment - - - - -  Follow up - - - - -     Assessment & Plan  1. Adhesive capsulitis of both shoulders  - Ambulatory referral to Home Health  2. Type 1 diabetes mellitus with hyperglycemia, with long-term current use of insulin ([place])  - Ambulatory referral to [place]  3. Mild cognitive impairment with memory loss   4. Atherosclerosis of aorta ([place])   5. Hypertension associated with diabetes ([place])   6. Gait instability  - Ambulatory referral to Home Health  7. Type 1 diabetes mellitus with microalbuminuria ([facility])

## 2020-04-19 DIAGNOSIS — I700 Atherosclerosis of aorta: Secondary | ICD-10-CM | POA: Diagnosis not present

## 2020-04-19 DIAGNOSIS — G5600 Carpal tunnel syndrome, unspecified upper limb: Secondary | ICD-10-CM | POA: Diagnosis not present

## 2020-04-19 DIAGNOSIS — E1169 Type 2 diabetes mellitus with other specified complication: Secondary | ICD-10-CM | POA: Diagnosis not present

## 2020-04-19 DIAGNOSIS — M810 Age-related osteoporosis without current pathological fracture: Secondary | ICD-10-CM | POA: Diagnosis not present

## 2020-04-19 DIAGNOSIS — E1065 Type 1 diabetes mellitus with hyperglycemia: Secondary | ICD-10-CM | POA: Diagnosis not present

## 2020-04-19 DIAGNOSIS — M7502 Adhesive capsulitis of left shoulder: Secondary | ICD-10-CM | POA: Diagnosis not present

## 2020-04-19 DIAGNOSIS — I10 Essential (primary) hypertension: Secondary | ICD-10-CM | POA: Diagnosis not present

## 2020-04-19 DIAGNOSIS — M7501 Adhesive capsulitis of right shoulder: Secondary | ICD-10-CM | POA: Diagnosis not present

## 2020-04-19 DIAGNOSIS — K8020 Calculus of gallbladder without cholecystitis without obstruction: Secondary | ICD-10-CM | POA: Diagnosis not present

## 2020-04-22 DIAGNOSIS — G5600 Carpal tunnel syndrome, unspecified upper limb: Secondary | ICD-10-CM | POA: Diagnosis not present

## 2020-04-22 DIAGNOSIS — M7501 Adhesive capsulitis of right shoulder: Secondary | ICD-10-CM | POA: Diagnosis not present

## 2020-04-22 DIAGNOSIS — E1065 Type 1 diabetes mellitus with hyperglycemia: Secondary | ICD-10-CM | POA: Diagnosis not present

## 2020-04-22 DIAGNOSIS — K8020 Calculus of gallbladder without cholecystitis without obstruction: Secondary | ICD-10-CM | POA: Diagnosis not present

## 2020-04-22 DIAGNOSIS — E1169 Type 2 diabetes mellitus with other specified complication: Secondary | ICD-10-CM | POA: Diagnosis not present

## 2020-04-22 DIAGNOSIS — I700 Atherosclerosis of aorta: Secondary | ICD-10-CM | POA: Diagnosis not present

## 2020-04-22 DIAGNOSIS — I10 Essential (primary) hypertension: Secondary | ICD-10-CM | POA: Diagnosis not present

## 2020-04-22 DIAGNOSIS — M7502 Adhesive capsulitis of left shoulder: Secondary | ICD-10-CM | POA: Diagnosis not present

## 2020-04-22 DIAGNOSIS — M810 Age-related osteoporosis without current pathological fracture: Secondary | ICD-10-CM | POA: Diagnosis not present

## 2020-04-23 ENCOUNTER — Telehealth: Payer: Self-pay

## 2020-04-23 DIAGNOSIS — M7501 Adhesive capsulitis of right shoulder: Secondary | ICD-10-CM | POA: Diagnosis not present

## 2020-04-23 DIAGNOSIS — I10 Essential (primary) hypertension: Secondary | ICD-10-CM | POA: Diagnosis not present

## 2020-04-23 DIAGNOSIS — G5600 Carpal tunnel syndrome, unspecified upper limb: Secondary | ICD-10-CM | POA: Diagnosis not present

## 2020-04-23 DIAGNOSIS — E1065 Type 1 diabetes mellitus with hyperglycemia: Secondary | ICD-10-CM | POA: Diagnosis not present

## 2020-04-23 DIAGNOSIS — K8020 Calculus of gallbladder without cholecystitis without obstruction: Secondary | ICD-10-CM | POA: Diagnosis not present

## 2020-04-23 DIAGNOSIS — M810 Age-related osteoporosis without current pathological fracture: Secondary | ICD-10-CM | POA: Diagnosis not present

## 2020-04-23 DIAGNOSIS — I700 Atherosclerosis of aorta: Secondary | ICD-10-CM | POA: Diagnosis not present

## 2020-04-23 DIAGNOSIS — M7502 Adhesive capsulitis of left shoulder: Secondary | ICD-10-CM | POA: Diagnosis not present

## 2020-04-23 DIAGNOSIS — E1169 Type 2 diabetes mellitus with other specified complication: Secondary | ICD-10-CM | POA: Diagnosis not present

## 2020-04-23 NOTE — Telephone Encounter (Signed)
Copied from [place] [id number]. Topic: Quick Communication - Home Health Verbal Orders >> Apr 23, 2020  1:56 PM Tessa Lerner A wrote: Caller/Agency: [place] / [facility] Callback Number: [phone] Requesting OT/PT/Skilled Nursing/Social Work/Speech Therapy: OT Frequency: 2x2 1x2

## 2020-04-29 ENCOUNTER — Ambulatory Visit: Payer: Medicare Other | Admitting: Family Medicine

## 2020-04-29 DIAGNOSIS — G5600 Carpal tunnel syndrome, unspecified upper limb: Secondary | ICD-10-CM | POA: Diagnosis not present

## 2020-04-29 DIAGNOSIS — I700 Atherosclerosis of aorta: Secondary | ICD-10-CM | POA: Diagnosis not present

## 2020-04-29 DIAGNOSIS — E1169 Type 2 diabetes mellitus with other specified complication: Secondary | ICD-10-CM | POA: Diagnosis not present

## 2020-04-29 DIAGNOSIS — K8020 Calculus of gallbladder without cholecystitis without obstruction: Secondary | ICD-10-CM | POA: Diagnosis not present

## 2020-04-29 DIAGNOSIS — M810 Age-related osteoporosis without current pathological fracture: Secondary | ICD-10-CM | POA: Diagnosis not present

## 2020-04-29 DIAGNOSIS — M7502 Adhesive capsulitis of left shoulder: Secondary | ICD-10-CM | POA: Diagnosis not present

## 2020-04-29 DIAGNOSIS — I10 Essential (primary) hypertension: Secondary | ICD-10-CM | POA: Diagnosis not present

## 2020-04-29 DIAGNOSIS — E1065 Type 1 diabetes mellitus with hyperglycemia: Secondary | ICD-10-CM | POA: Diagnosis not present

## 2020-04-29 DIAGNOSIS — M7501 Adhesive capsulitis of right shoulder: Secondary | ICD-10-CM | POA: Diagnosis not present

## 2020-04-30 ENCOUNTER — Other Ambulatory Visit: Payer: Self-pay | Admitting: Family Medicine

## 2020-04-30 DIAGNOSIS — G5600 Carpal tunnel syndrome, unspecified upper limb: Secondary | ICD-10-CM | POA: Diagnosis not present

## 2020-04-30 DIAGNOSIS — M7501 Adhesive capsulitis of right shoulder: Secondary | ICD-10-CM | POA: Diagnosis not present

## 2020-04-30 DIAGNOSIS — E1065 Type 1 diabetes mellitus with hyperglycemia: Secondary | ICD-10-CM | POA: Diagnosis not present

## 2020-04-30 DIAGNOSIS — I10 Essential (primary) hypertension: Secondary | ICD-10-CM

## 2020-04-30 DIAGNOSIS — M810 Age-related osteoporosis without current pathological fracture: Secondary | ICD-10-CM | POA: Diagnosis not present

## 2020-04-30 DIAGNOSIS — K8020 Calculus of gallbladder without cholecystitis without obstruction: Secondary | ICD-10-CM | POA: Diagnosis not present

## 2020-04-30 DIAGNOSIS — E1169 Type 2 diabetes mellitus with other specified complication: Secondary | ICD-10-CM | POA: Diagnosis not present

## 2020-04-30 DIAGNOSIS — I700 Atherosclerosis of aorta: Secondary | ICD-10-CM | POA: Diagnosis not present

## 2020-04-30 DIAGNOSIS — M7502 Adhesive capsulitis of left shoulder: Secondary | ICD-10-CM | POA: Diagnosis not present

## 2020-05-01 DIAGNOSIS — G5600 Carpal tunnel syndrome, unspecified upper limb: Secondary | ICD-10-CM | POA: Diagnosis not present

## 2020-05-01 DIAGNOSIS — E1065 Type 1 diabetes mellitus with hyperglycemia: Secondary | ICD-10-CM | POA: Diagnosis not present

## 2020-05-01 DIAGNOSIS — I700 Atherosclerosis of aorta: Secondary | ICD-10-CM | POA: Diagnosis not present

## 2020-05-01 DIAGNOSIS — E1169 Type 2 diabetes mellitus with other specified complication: Secondary | ICD-10-CM | POA: Diagnosis not present

## 2020-05-01 DIAGNOSIS — M7502 Adhesive capsulitis of left shoulder: Secondary | ICD-10-CM | POA: Diagnosis not present

## 2020-05-01 DIAGNOSIS — K8020 Calculus of gallbladder without cholecystitis without obstruction: Secondary | ICD-10-CM | POA: Diagnosis not present

## 2020-05-01 DIAGNOSIS — M7501 Adhesive capsulitis of right shoulder: Secondary | ICD-10-CM | POA: Diagnosis not present

## 2020-05-01 DIAGNOSIS — I10 Essential (primary) hypertension: Secondary | ICD-10-CM | POA: Diagnosis not present

## 2020-05-01 DIAGNOSIS — M810 Age-related osteoporosis without current pathological fracture: Secondary | ICD-10-CM | POA: Diagnosis not present

## 2020-05-02 DIAGNOSIS — I10 Essential (primary) hypertension: Secondary | ICD-10-CM | POA: Diagnosis not present

## 2020-05-02 DIAGNOSIS — G5600 Carpal tunnel syndrome, unspecified upper limb: Secondary | ICD-10-CM | POA: Diagnosis not present

## 2020-05-02 DIAGNOSIS — M810 Age-related osteoporosis without current pathological fracture: Secondary | ICD-10-CM | POA: Diagnosis not present

## 2020-05-02 DIAGNOSIS — M7501 Adhesive capsulitis of right shoulder: Secondary | ICD-10-CM | POA: Diagnosis not present

## 2020-05-02 DIAGNOSIS — E1065 Type 1 diabetes mellitus with hyperglycemia: Secondary | ICD-10-CM | POA: Diagnosis not present

## 2020-05-02 DIAGNOSIS — M7502 Adhesive capsulitis of left shoulder: Secondary | ICD-10-CM | POA: Diagnosis not present

## 2020-05-02 DIAGNOSIS — E1169 Type 2 diabetes mellitus with other specified complication: Secondary | ICD-10-CM | POA: Diagnosis not present

## 2020-05-02 DIAGNOSIS — I700 Atherosclerosis of aorta: Secondary | ICD-10-CM | POA: Diagnosis not present

## 2020-05-02 DIAGNOSIS — K8020 Calculus of gallbladder without cholecystitis without obstruction: Secondary | ICD-10-CM | POA: Diagnosis not present

## 2020-05-06 DIAGNOSIS — M7501 Adhesive capsulitis of right shoulder: Secondary | ICD-10-CM | POA: Diagnosis not present

## 2020-05-06 DIAGNOSIS — I700 Atherosclerosis of aorta: Secondary | ICD-10-CM | POA: Diagnosis not present

## 2020-05-06 DIAGNOSIS — E1065 Type 1 diabetes mellitus with hyperglycemia: Secondary | ICD-10-CM | POA: Diagnosis not present

## 2020-05-06 DIAGNOSIS — M7502 Adhesive capsulitis of left shoulder: Secondary | ICD-10-CM | POA: Diagnosis not present

## 2020-05-06 DIAGNOSIS — I10 Essential (primary) hypertension: Secondary | ICD-10-CM | POA: Diagnosis not present

## 2020-05-06 DIAGNOSIS — M810 Age-related osteoporosis without current pathological fracture: Secondary | ICD-10-CM | POA: Diagnosis not present

## 2020-05-06 DIAGNOSIS — K8020 Calculus of gallbladder without cholecystitis without obstruction: Secondary | ICD-10-CM | POA: Diagnosis not present

## 2020-05-06 DIAGNOSIS — G5600 Carpal tunnel syndrome, unspecified upper limb: Secondary | ICD-10-CM | POA: Diagnosis not present

## 2020-05-06 DIAGNOSIS — E1169 Type 2 diabetes mellitus with other specified complication: Secondary | ICD-10-CM | POA: Diagnosis not present

## 2020-05-07 ENCOUNTER — Other Ambulatory Visit: Payer: Self-pay | Admitting: Family Medicine

## 2020-05-07 DIAGNOSIS — I700 Atherosclerosis of aorta: Secondary | ICD-10-CM

## 2020-05-07 DIAGNOSIS — M7501 Adhesive capsulitis of right shoulder: Secondary | ICD-10-CM | POA: Diagnosis not present

## 2020-05-07 DIAGNOSIS — M810 Age-related osteoporosis without current pathological fracture: Secondary | ICD-10-CM | POA: Diagnosis not present

## 2020-05-07 DIAGNOSIS — E1169 Type 2 diabetes mellitus with other specified complication: Secondary | ICD-10-CM | POA: Diagnosis not present

## 2020-05-07 DIAGNOSIS — E785 Hyperlipidemia, unspecified: Secondary | ICD-10-CM

## 2020-05-07 DIAGNOSIS — E1065 Type 1 diabetes mellitus with hyperglycemia: Secondary | ICD-10-CM | POA: Diagnosis not present

## 2020-05-07 DIAGNOSIS — I10 Essential (primary) hypertension: Secondary | ICD-10-CM | POA: Diagnosis not present

## 2020-05-07 DIAGNOSIS — M7502 Adhesive capsulitis of left shoulder: Secondary | ICD-10-CM | POA: Diagnosis not present

## 2020-05-07 DIAGNOSIS — G5600 Carpal tunnel syndrome, unspecified upper limb: Secondary | ICD-10-CM | POA: Diagnosis not present

## 2020-05-07 DIAGNOSIS — K8020 Calculus of gallbladder without cholecystitis without obstruction: Secondary | ICD-10-CM | POA: Diagnosis not present

## 2020-05-08 DIAGNOSIS — M7502 Adhesive capsulitis of left shoulder: Secondary | ICD-10-CM | POA: Diagnosis not present

## 2020-05-08 DIAGNOSIS — K8020 Calculus of gallbladder without cholecystitis without obstruction: Secondary | ICD-10-CM | POA: Diagnosis not present

## 2020-05-08 DIAGNOSIS — I700 Atherosclerosis of aorta: Secondary | ICD-10-CM | POA: Diagnosis not present

## 2020-05-08 DIAGNOSIS — G5600 Carpal tunnel syndrome, unspecified upper limb: Secondary | ICD-10-CM | POA: Diagnosis not present

## 2020-05-08 DIAGNOSIS — I10 Essential (primary) hypertension: Secondary | ICD-10-CM | POA: Diagnosis not present

## 2020-05-08 DIAGNOSIS — E1169 Type 2 diabetes mellitus with other specified complication: Secondary | ICD-10-CM | POA: Diagnosis not present

## 2020-05-08 DIAGNOSIS — M810 Age-related osteoporosis without current pathological fracture: Secondary | ICD-10-CM | POA: Diagnosis not present

## 2020-05-08 DIAGNOSIS — M7501 Adhesive capsulitis of right shoulder: Secondary | ICD-10-CM | POA: Diagnosis not present

## 2020-05-08 DIAGNOSIS — E1065 Type 1 diabetes mellitus with hyperglycemia: Secondary | ICD-10-CM | POA: Diagnosis not present

## 2020-05-09 DIAGNOSIS — K8020 Calculus of gallbladder without cholecystitis without obstruction: Secondary | ICD-10-CM | POA: Diagnosis not present

## 2020-05-09 DIAGNOSIS — I10 Essential (primary) hypertension: Secondary | ICD-10-CM | POA: Diagnosis not present

## 2020-05-09 DIAGNOSIS — M7502 Adhesive capsulitis of left shoulder: Secondary | ICD-10-CM | POA: Diagnosis not present

## 2020-05-09 DIAGNOSIS — E1065 Type 1 diabetes mellitus with hyperglycemia: Secondary | ICD-10-CM | POA: Diagnosis not present

## 2020-05-09 DIAGNOSIS — I700 Atherosclerosis of aorta: Secondary | ICD-10-CM | POA: Diagnosis not present

## 2020-05-09 DIAGNOSIS — G5600 Carpal tunnel syndrome, unspecified upper limb: Secondary | ICD-10-CM | POA: Diagnosis not present

## 2020-05-09 DIAGNOSIS — E1169 Type 2 diabetes mellitus with other specified complication: Secondary | ICD-10-CM | POA: Diagnosis not present

## 2020-05-09 DIAGNOSIS — M7501 Adhesive capsulitis of right shoulder: Secondary | ICD-10-CM | POA: Diagnosis not present

## 2020-05-09 DIAGNOSIS — M810 Age-related osteoporosis without current pathological fracture: Secondary | ICD-10-CM | POA: Diagnosis not present

## 2020-05-10 ENCOUNTER — Other Ambulatory Visit: Payer: Self-pay | Admitting: Family Medicine

## 2020-05-10 DIAGNOSIS — I10 Essential (primary) hypertension: Secondary | ICD-10-CM

## 2020-05-13 DIAGNOSIS — M7502 Adhesive capsulitis of left shoulder: Secondary | ICD-10-CM | POA: Diagnosis not present

## 2020-05-13 DIAGNOSIS — E1065 Type 1 diabetes mellitus with hyperglycemia: Secondary | ICD-10-CM | POA: Diagnosis not present

## 2020-05-13 DIAGNOSIS — K8020 Calculus of gallbladder without cholecystitis without obstruction: Secondary | ICD-10-CM | POA: Diagnosis not present

## 2020-05-13 DIAGNOSIS — G5600 Carpal tunnel syndrome, unspecified upper limb: Secondary | ICD-10-CM | POA: Diagnosis not present

## 2020-05-13 DIAGNOSIS — I700 Atherosclerosis of aorta: Secondary | ICD-10-CM | POA: Diagnosis not present

## 2020-05-13 DIAGNOSIS — E1169 Type 2 diabetes mellitus with other specified complication: Secondary | ICD-10-CM | POA: Diagnosis not present

## 2020-05-13 DIAGNOSIS — I10 Essential (primary) hypertension: Secondary | ICD-10-CM | POA: Diagnosis not present

## 2020-05-13 DIAGNOSIS — M7501 Adhesive capsulitis of right shoulder: Secondary | ICD-10-CM | POA: Diagnosis not present

## 2020-05-13 DIAGNOSIS — M810 Age-related osteoporosis without current pathological fracture: Secondary | ICD-10-CM | POA: Diagnosis not present

## 2020-05-14 ENCOUNTER — Telehealth: Payer: Self-pay | Admitting: Family Medicine

## 2020-05-14 DIAGNOSIS — E1169 Type 2 diabetes mellitus with other specified complication: Secondary | ICD-10-CM | POA: Diagnosis not present

## 2020-05-14 DIAGNOSIS — K8020 Calculus of gallbladder without cholecystitis without obstruction: Secondary | ICD-10-CM | POA: Diagnosis not present

## 2020-05-14 DIAGNOSIS — I10 Essential (primary) hypertension: Secondary | ICD-10-CM | POA: Diagnosis not present

## 2020-05-14 DIAGNOSIS — E1065 Type 1 diabetes mellitus with hyperglycemia: Secondary | ICD-10-CM | POA: Diagnosis not present

## 2020-05-14 DIAGNOSIS — M7502 Adhesive capsulitis of left shoulder: Secondary | ICD-10-CM | POA: Diagnosis not present

## 2020-05-14 DIAGNOSIS — M810 Age-related osteoporosis without current pathological fracture: Secondary | ICD-10-CM | POA: Diagnosis not present

## 2020-05-14 DIAGNOSIS — M7501 Adhesive capsulitis of right shoulder: Secondary | ICD-10-CM | POA: Diagnosis not present

## 2020-05-14 DIAGNOSIS — G5600 Carpal tunnel syndrome, unspecified upper limb: Secondary | ICD-10-CM | POA: Diagnosis not present

## 2020-05-14 DIAGNOSIS — I700 Atherosclerosis of aorta: Secondary | ICD-10-CM | POA: Diagnosis not present

## 2020-05-14 NOTE — Telephone Encounter (Signed)
Stephanie with [facility] would like verbal orders continuation  of OT 1 wk 3 [phone]

## 2020-05-15 NOTE — Telephone Encounter (Signed)
Left message on voice mail for patient.

## 2020-05-16 DIAGNOSIS — M810 Age-related osteoporosis without current pathological fracture: Secondary | ICD-10-CM | POA: Diagnosis not present

## 2020-05-16 DIAGNOSIS — E1065 Type 1 diabetes mellitus with hyperglycemia: Secondary | ICD-10-CM | POA: Diagnosis not present

## 2020-05-16 DIAGNOSIS — I10 Essential (primary) hypertension: Secondary | ICD-10-CM | POA: Diagnosis not present

## 2020-05-16 DIAGNOSIS — M7501 Adhesive capsulitis of right shoulder: Secondary | ICD-10-CM | POA: Diagnosis not present

## 2020-05-16 DIAGNOSIS — K8020 Calculus of gallbladder without cholecystitis without obstruction: Secondary | ICD-10-CM | POA: Diagnosis not present

## 2020-05-16 DIAGNOSIS — E1169 Type 2 diabetes mellitus with other specified complication: Secondary | ICD-10-CM | POA: Diagnosis not present

## 2020-05-16 DIAGNOSIS — M7502 Adhesive capsulitis of left shoulder: Secondary | ICD-10-CM | POA: Diagnosis not present

## 2020-05-16 DIAGNOSIS — G5600 Carpal tunnel syndrome, unspecified upper limb: Secondary | ICD-10-CM | POA: Diagnosis not present

## 2020-05-16 DIAGNOSIS — I700 Atherosclerosis of aorta: Secondary | ICD-10-CM | POA: Diagnosis not present

## 2020-05-18 DIAGNOSIS — I10 Essential (primary) hypertension: Secondary | ICD-10-CM | POA: Diagnosis not present

## 2020-05-18 DIAGNOSIS — M810 Age-related osteoporosis without current pathological fracture: Secondary | ICD-10-CM | POA: Diagnosis not present

## 2020-05-18 DIAGNOSIS — G5600 Carpal tunnel syndrome, unspecified upper limb: Secondary | ICD-10-CM | POA: Diagnosis not present

## 2020-05-18 DIAGNOSIS — K8020 Calculus of gallbladder without cholecystitis without obstruction: Secondary | ICD-10-CM | POA: Diagnosis not present

## 2020-05-18 DIAGNOSIS — M7502 Adhesive capsulitis of left shoulder: Secondary | ICD-10-CM | POA: Diagnosis not present

## 2020-05-18 DIAGNOSIS — I700 Atherosclerosis of aorta: Secondary | ICD-10-CM | POA: Diagnosis not present

## 2020-05-18 DIAGNOSIS — E1065 Type 1 diabetes mellitus with hyperglycemia: Secondary | ICD-10-CM | POA: Diagnosis not present

## 2020-05-18 DIAGNOSIS — E1169 Type 2 diabetes mellitus with other specified complication: Secondary | ICD-10-CM | POA: Diagnosis not present

## 2020-05-18 DIAGNOSIS — M7501 Adhesive capsulitis of right shoulder: Secondary | ICD-10-CM | POA: Diagnosis not present

## 2020-05-22 DIAGNOSIS — G5600 Carpal tunnel syndrome, unspecified upper limb: Secondary | ICD-10-CM | POA: Diagnosis not present

## 2020-05-22 DIAGNOSIS — I10 Essential (primary) hypertension: Secondary | ICD-10-CM | POA: Diagnosis not present

## 2020-05-22 DIAGNOSIS — E1169 Type 2 diabetes mellitus with other specified complication: Secondary | ICD-10-CM | POA: Diagnosis not present

## 2020-05-22 DIAGNOSIS — I700 Atherosclerosis of aorta: Secondary | ICD-10-CM | POA: Diagnosis not present

## 2020-05-22 DIAGNOSIS — E1065 Type 1 diabetes mellitus with hyperglycemia: Secondary | ICD-10-CM | POA: Diagnosis not present

## 2020-05-22 DIAGNOSIS — K8020 Calculus of gallbladder without cholecystitis without obstruction: Secondary | ICD-10-CM | POA: Diagnosis not present

## 2020-05-22 DIAGNOSIS — M810 Age-related osteoporosis without current pathological fracture: Secondary | ICD-10-CM | POA: Diagnosis not present

## 2020-05-22 DIAGNOSIS — M7501 Adhesive capsulitis of right shoulder: Secondary | ICD-10-CM | POA: Diagnosis not present

## 2020-05-22 DIAGNOSIS — M7502 Adhesive capsulitis of left shoulder: Secondary | ICD-10-CM | POA: Diagnosis not present

## 2020-05-23 DIAGNOSIS — M7501 Adhesive capsulitis of right shoulder: Secondary | ICD-10-CM | POA: Diagnosis not present

## 2020-05-23 DIAGNOSIS — E1065 Type 1 diabetes mellitus with hyperglycemia: Secondary | ICD-10-CM | POA: Diagnosis not present

## 2020-05-23 DIAGNOSIS — G5600 Carpal tunnel syndrome, unspecified upper limb: Secondary | ICD-10-CM | POA: Diagnosis not present

## 2020-05-23 DIAGNOSIS — I700 Atherosclerosis of aorta: Secondary | ICD-10-CM | POA: Diagnosis not present

## 2020-05-23 DIAGNOSIS — K8020 Calculus of gallbladder without cholecystitis without obstruction: Secondary | ICD-10-CM | POA: Diagnosis not present

## 2020-05-23 DIAGNOSIS — M7502 Adhesive capsulitis of left shoulder: Secondary | ICD-10-CM | POA: Diagnosis not present

## 2020-05-23 DIAGNOSIS — E1169 Type 2 diabetes mellitus with other specified complication: Secondary | ICD-10-CM | POA: Diagnosis not present

## 2020-05-23 DIAGNOSIS — M810 Age-related osteoporosis without current pathological fracture: Secondary | ICD-10-CM | POA: Diagnosis not present

## 2020-05-23 DIAGNOSIS — I10 Essential (primary) hypertension: Secondary | ICD-10-CM | POA: Diagnosis not present

## 2020-05-27 DIAGNOSIS — K8020 Calculus of gallbladder without cholecystitis without obstruction: Secondary | ICD-10-CM | POA: Diagnosis not present

## 2020-05-27 DIAGNOSIS — I700 Atherosclerosis of aorta: Secondary | ICD-10-CM | POA: Diagnosis not present

## 2020-05-27 DIAGNOSIS — M7502 Adhesive capsulitis of left shoulder: Secondary | ICD-10-CM | POA: Diagnosis not present

## 2020-05-27 DIAGNOSIS — G5600 Carpal tunnel syndrome, unspecified upper limb: Secondary | ICD-10-CM | POA: Diagnosis not present

## 2020-05-27 DIAGNOSIS — E1169 Type 2 diabetes mellitus with other specified complication: Secondary | ICD-10-CM | POA: Diagnosis not present

## 2020-05-27 DIAGNOSIS — M7501 Adhesive capsulitis of right shoulder: Secondary | ICD-10-CM | POA: Diagnosis not present

## 2020-05-27 DIAGNOSIS — I10 Essential (primary) hypertension: Secondary | ICD-10-CM | POA: Diagnosis not present

## 2020-05-27 DIAGNOSIS — M810 Age-related osteoporosis without current pathological fracture: Secondary | ICD-10-CM | POA: Diagnosis not present

## 2020-05-27 DIAGNOSIS — E1065 Type 1 diabetes mellitus with hyperglycemia: Secondary | ICD-10-CM | POA: Diagnosis not present

## 2020-05-28 DIAGNOSIS — G5600 Carpal tunnel syndrome, unspecified upper limb: Secondary | ICD-10-CM | POA: Diagnosis not present

## 2020-05-28 DIAGNOSIS — I700 Atherosclerosis of aorta: Secondary | ICD-10-CM | POA: Diagnosis not present

## 2020-05-28 DIAGNOSIS — M810 Age-related osteoporosis without current pathological fracture: Secondary | ICD-10-CM | POA: Diagnosis not present

## 2020-05-28 DIAGNOSIS — K8020 Calculus of gallbladder without cholecystitis without obstruction: Secondary | ICD-10-CM | POA: Diagnosis not present

## 2020-05-28 DIAGNOSIS — M7502 Adhesive capsulitis of left shoulder: Secondary | ICD-10-CM | POA: Diagnosis not present

## 2020-05-28 DIAGNOSIS — I10 Essential (primary) hypertension: Secondary | ICD-10-CM | POA: Diagnosis not present

## 2020-05-28 DIAGNOSIS — E1065 Type 1 diabetes mellitus with hyperglycemia: Secondary | ICD-10-CM | POA: Diagnosis not present

## 2020-05-28 DIAGNOSIS — E1169 Type 2 diabetes mellitus with other specified complication: Secondary | ICD-10-CM | POA: Diagnosis not present

## 2020-05-28 DIAGNOSIS — M7501 Adhesive capsulitis of right shoulder: Secondary | ICD-10-CM | POA: Diagnosis not present

## 2020-05-29 ENCOUNTER — Telehealth: Payer: Self-pay

## 2020-05-29 NOTE — Progress Notes (Signed)
    Chronic Care Management Pharmacy Assistant   Name: [name]  MRN: [id number] DOB: 05-27-1940  Reason for Encounter: Medication Review/General Adherence Call.   Recent office visits:  04/09/2020 PCP Steele Sizer   Recent consult visits:  02/08/2020 Ophthalmology Leory Plowman 02/21/2020 Endocrinology [facility] visits:  None in previous 6 months  Medications: Outpatient Encounter Medications as of 05/29/2020  Medication Sig Note  . ACCU-CHEK AVIVA PLUS test strip USE 1 STRIP UP TO 4 TIMES DAILY   . alendronate (FOSAMAX) 70 MG tablet TAKE 1 TABLET BY MOUTH EVERY 7 DAYS. TAKE WITH A FULL GLASS OF WATER ON AN EMPTY STOMACH.   Marland Kitchen amLODipine (NORVASC) 5 MG tablet TAKE 1 TABLET BY MOUTH EVERY DAY   . aspirin EC 81 MG tablet Take 81 mg by mouth daily.   Marland Kitchen atorvastatin (LIPITOR) 80 MG tablet TAKE 1 TABLET BY MOUTH EVERY DAY   . blood glucose meter kit and supplies  09/19/2014: DX: 250.02 DM II Received from: [facility]  . Calcium Carbonate-Vitamin D (CALCIUM-VITAMIN D) 600-125 MG-UNIT TABS Take 1 tablet by mouth daily. 09/19/2014: Received from: [facility]: Take by mouth.  . [employer] ALCOHOL SWABS PADS USE 4 TIMES DAILY TO WIPE SKIN BEFORE USING INSULIN   . Glucagon, rDNA, (GLUCAGON EMERGENCY) 1 MG KIT Inject 1 kit as directed daily as needed.   . hydrALAZINE (APRESOLINE) 10 MG tablet TAKE 1 TABLET (10 MG TOTAL) BY MOUTH 3 (THREE) TIMES DAILY AS NEEDED. BP ABOVE 150/90   . insulin aspart protamine- aspart (NOVOLOG MIX 70/30) (70-30) 100 UNIT/ML injection 45 Units daily with breakfast. Pt taking 30 units in AM and 10 units before bed only per Dr. Paulla Fore 09/26/2014: Received from: [facility]  . losartan-hydrochlorothiazide (HYZAAR) 100-12.5 MG tablet TAKE 1 TABLET BY MOUTH EVERY DAY   . ondansetron (ZOFRAN ODT) 4 MG disintegrating tablet Take 1 tablet (4 mg total) by mouth every 8 (eight) hours as needed for  nausea or vomiting.    No facility-administered encounter medications on file as of 05/29/2020.    Star Rating Drugs:atorvastatin, losartan-HCTZ   Called patient and discussed medication adherence  with patient, no issues at this time with current medication.   Patient denies ED visit since her last CPP follow up.  Patient denies any side effects with her medication. Patient denies any problems with her current pharmacy    Patient states she takes her medication as prescribe without missing a dose.  [place] Pharmacist Assistant [phone]

## 2020-06-03 DIAGNOSIS — M7502 Adhesive capsulitis of left shoulder: Secondary | ICD-10-CM | POA: Diagnosis not present

## 2020-06-03 DIAGNOSIS — M7501 Adhesive capsulitis of right shoulder: Secondary | ICD-10-CM | POA: Diagnosis not present

## 2020-06-03 DIAGNOSIS — K8020 Calculus of gallbladder without cholecystitis without obstruction: Secondary | ICD-10-CM | POA: Diagnosis not present

## 2020-06-03 DIAGNOSIS — I10 Essential (primary) hypertension: Secondary | ICD-10-CM | POA: Diagnosis not present

## 2020-06-03 DIAGNOSIS — E1065 Type 1 diabetes mellitus with hyperglycemia: Secondary | ICD-10-CM | POA: Diagnosis not present

## 2020-06-03 DIAGNOSIS — E1169 Type 2 diabetes mellitus with other specified complication: Secondary | ICD-10-CM | POA: Diagnosis not present

## 2020-06-03 DIAGNOSIS — M810 Age-related osteoporosis without current pathological fracture: Secondary | ICD-10-CM | POA: Diagnosis not present

## 2020-06-03 DIAGNOSIS — G5600 Carpal tunnel syndrome, unspecified upper limb: Secondary | ICD-10-CM | POA: Diagnosis not present

## 2020-06-03 DIAGNOSIS — I700 Atherosclerosis of aorta: Secondary | ICD-10-CM | POA: Diagnosis not present

## 2020-06-04 DIAGNOSIS — E1169 Type 2 diabetes mellitus with other specified complication: Secondary | ICD-10-CM | POA: Diagnosis not present

## 2020-06-04 DIAGNOSIS — M7501 Adhesive capsulitis of right shoulder: Secondary | ICD-10-CM | POA: Diagnosis not present

## 2020-06-04 DIAGNOSIS — I700 Atherosclerosis of aorta: Secondary | ICD-10-CM | POA: Diagnosis not present

## 2020-06-04 DIAGNOSIS — M810 Age-related osteoporosis without current pathological fracture: Secondary | ICD-10-CM | POA: Diagnosis not present

## 2020-06-04 DIAGNOSIS — I10 Essential (primary) hypertension: Secondary | ICD-10-CM | POA: Diagnosis not present

## 2020-06-04 DIAGNOSIS — G5600 Carpal tunnel syndrome, unspecified upper limb: Secondary | ICD-10-CM | POA: Diagnosis not present

## 2020-06-04 DIAGNOSIS — K8020 Calculus of gallbladder without cholecystitis without obstruction: Secondary | ICD-10-CM | POA: Diagnosis not present

## 2020-06-04 DIAGNOSIS — E1065 Type 1 diabetes mellitus with hyperglycemia: Secondary | ICD-10-CM | POA: Diagnosis not present

## 2020-06-04 DIAGNOSIS — M7502 Adhesive capsulitis of left shoulder: Secondary | ICD-10-CM | POA: Diagnosis not present

## 2020-06-20 DIAGNOSIS — E1159 Type 2 diabetes mellitus with other circulatory complications: Secondary | ICD-10-CM | POA: Diagnosis not present

## 2020-06-20 DIAGNOSIS — E1065 Type 1 diabetes mellitus with hyperglycemia: Secondary | ICD-10-CM | POA: Diagnosis not present

## 2020-06-20 DIAGNOSIS — I152 Hypertension secondary to endocrine disorders: Secondary | ICD-10-CM | POA: Diagnosis not present

## 2020-06-20 DIAGNOSIS — M810 Age-related osteoporosis without current pathological fracture: Secondary | ICD-10-CM | POA: Diagnosis not present

## 2020-06-20 DIAGNOSIS — E1069 Type 1 diabetes mellitus with other specified complication: Secondary | ICD-10-CM | POA: Diagnosis not present

## 2020-06-20 DIAGNOSIS — E785 Hyperlipidemia, unspecified: Secondary | ICD-10-CM | POA: Diagnosis not present

## 2020-06-20 LAB — HEMOGLOBIN A1C: Hemoglobin A1C: 9.7

## 2020-07-20 ENCOUNTER — Other Ambulatory Visit: Payer: Self-pay | Admitting: Family Medicine

## 2020-07-20 DIAGNOSIS — I700 Atherosclerosis of aorta: Secondary | ICD-10-CM

## 2020-07-20 DIAGNOSIS — E785 Hyperlipidemia, unspecified: Secondary | ICD-10-CM

## 2020-07-20 NOTE — Telephone Encounter (Signed)
Requested Prescriptions  Pending Prescriptions Disp Refills  . atorvastatin (LIPITOR) 80 MG tablet [Pharmacy Med Name: ATORVASTATIN 80 MG TABLET] 90 tablet 0    Sig: TAKE 1 TABLET BY MOUTH EVERY DAY     Cardiovascular:  Antilipid - Statins Failed - 07/20/2020  9:05 AM      Failed - HDL in normal range and within 360 days    HDL  Date Value Ref Range Status  09/08/2019 47 (L) > OR = 50 mg/dL Final  04/10/2015 66 >39 mg/dL Final         Passed - Total Cholesterol in normal range and within 360 days    Cholesterol, Total  Date Value Ref Range Status  04/10/2015 192 100 - 199 mg/dL Final   Cholesterol  Date Value Ref Range Status  09/08/2019 154 <200 mg/dL Final         Passed - LDL in normal range and within 360 days    LDL Cholesterol (Calc)  Date Value Ref Range Status  09/08/2019 88 mg/dL (calc) Final    Comment:    Reference range: <100 . Desirable range <100 mg/dL for primary prevention;   <70 mg/dL for patients with CHD or diabetic patients  with > or = 2 CHD risk factors. Marland Kitchen LDL-C is now calculated using the Martin-Hopkins  calculation, which is a validated novel method providing  better accuracy than the Friedewald equation in the  estimation of LDL-C.  Cresenciano Genre et al. Annamaria Helling. 4128;786(76): 2061-2068  (http://education.Quest[facility].com/faq/FAQ164)          Passed - Triglycerides in normal range and within 360 days    Triglycerides  Date Value Ref Range Status  09/08/2019 96 <150 mg/dL Final         Passed - Patient is not pregnant      Passed - Valid encounter within last 12 months    Recent Outpatient Visits          3 months ago Adhesive capsulitis of both shoulders   [facility] [place], Drue Stager, MD   10 months ago Type 1 diabetes mellitus with hyperglycemia, with long-term current use of insulin [facility])   [facility] [place], Drue Stager, MD   11 months ago Type 1 diabetes mellitus with hyperglycemia, with long-term  current use of insulin [facility])   [facility] [place], Drue Stager, MD   1 year ago Type 1 diabetes mellitus with microalbuminuria [facility])   [facility] Steele Sizer, MD   1 year ago Type 1 diabetes mellitus with microalbuminuria [facility])   [facility] Steele Sizer, MD

## 2020-09-09 DIAGNOSIS — R0902 Hypoxemia: Secondary | ICD-10-CM | POA: Diagnosis not present

## 2020-09-09 DIAGNOSIS — E162 Hypoglycemia, unspecified: Secondary | ICD-10-CM | POA: Diagnosis not present

## 2020-09-09 DIAGNOSIS — R4020 Unspecified coma: Secondary | ICD-10-CM | POA: Diagnosis not present

## 2020-09-09 DIAGNOSIS — R404 Transient alteration of awareness: Secondary | ICD-10-CM | POA: Diagnosis not present

## 2020-09-09 DIAGNOSIS — E161 Other hypoglycemia: Secondary | ICD-10-CM | POA: Diagnosis not present

## 2020-09-13 DIAGNOSIS — I152 Hypertension secondary to endocrine disorders: Secondary | ICD-10-CM | POA: Diagnosis not present

## 2020-09-13 DIAGNOSIS — E1065 Type 1 diabetes mellitus with hyperglycemia: Secondary | ICD-10-CM | POA: Diagnosis not present

## 2020-09-13 DIAGNOSIS — E1159 Type 2 diabetes mellitus with other circulatory complications: Secondary | ICD-10-CM | POA: Diagnosis not present

## 2020-11-20 ENCOUNTER — Other Ambulatory Visit: Payer: Self-pay | Admitting: Family Medicine

## 2020-11-20 DIAGNOSIS — I10 Essential (primary) hypertension: Secondary | ICD-10-CM

## 2020-11-20 NOTE — Telephone Encounter (Signed)
Last seen 2.1.2022 no upcoming appt sch'd

## 2020-11-20 NOTE — Telephone Encounter (Signed)
Requested medications are due for refill today.  yes  Requested medications are on the active medications list.  yes  Last refill. 05/10/2020  Future visit scheduled.   no  Notes to clinic.  Pt last seen 04/09/2020. More than 3 months overdue for OV. Labs are expired.

## 2020-11-21 NOTE — Telephone Encounter (Signed)
Appt sch'd for 10.24.2022

## 2020-12-05 ENCOUNTER — Other Ambulatory Visit: Payer: Self-pay | Admitting: Family Medicine

## 2020-12-05 DIAGNOSIS — E785 Hyperlipidemia, unspecified: Secondary | ICD-10-CM

## 2020-12-05 DIAGNOSIS — M810 Age-related osteoporosis without current pathological fracture: Secondary | ICD-10-CM | POA: Diagnosis not present

## 2020-12-05 DIAGNOSIS — E1069 Type 1 diabetes mellitus with other specified complication: Secondary | ICD-10-CM | POA: Diagnosis not present

## 2020-12-05 DIAGNOSIS — I700 Atherosclerosis of aorta: Secondary | ICD-10-CM

## 2020-12-05 DIAGNOSIS — E1065 Type 1 diabetes mellitus with hyperglycemia: Secondary | ICD-10-CM | POA: Diagnosis not present

## 2020-12-05 DIAGNOSIS — I152 Hypertension secondary to endocrine disorders: Secondary | ICD-10-CM | POA: Diagnosis not present

## 2020-12-05 DIAGNOSIS — E1159 Type 2 diabetes mellitus with other circulatory complications: Secondary | ICD-10-CM | POA: Diagnosis not present

## 2020-12-13 ENCOUNTER — Other Ambulatory Visit: Payer: Self-pay | Admitting: Family Medicine

## 2020-12-13 DIAGNOSIS — I700 Atherosclerosis of aorta: Secondary | ICD-10-CM

## 2020-12-13 DIAGNOSIS — E785 Hyperlipidemia, unspecified: Secondary | ICD-10-CM

## 2020-12-19 ENCOUNTER — Other Ambulatory Visit: Payer: Self-pay | Admitting: Family Medicine

## 2020-12-19 DIAGNOSIS — I10 Essential (primary) hypertension: Secondary | ICD-10-CM

## 2020-12-19 NOTE — Telephone Encounter (Signed)
Requested Prescriptions  Pending Prescriptions Disp Refills  . amLODipine (NORVASC) 5 MG tablet [Pharmacy Med Name: AMLODIPINE BESYLATE 5 MG TAB] 90 tablet 0    Sig: TAKE 1 TABLET BY MOUTH EVERY DAY     Cardiovascular:  Calcium Channel Blockers Failed - 12/19/2020  3:44 AM      Failed - Valid encounter within last 6 months    Recent Outpatient Visits          8 months ago Adhesive capsulitis of both shoulders   [facility] [place], Drue Stager, MD   1 year ago Type 1 diabetes mellitus with hyperglycemia, with long-term current use of insulin [facility])   [facility] [address], Drue Stager, MD   1 year ago Type 1 diabetes mellitus with hyperglycemia, with long-term current use of insulin [facility])   [facility] [place], Drue Stager, MD   1 year ago Type 1 diabetes mellitus with microalbuminuria [facility])   [facility] [place], Drue Stager, MD   2 years ago Type 1 diabetes mellitus with microalbuminuria [facility])   [facility] Steele Sizer, MD      Future Appointments            In 1 week Steele Sizer, MD [place] BP in normal range    BP Readings from Last 1 Encounters:  04/09/20 136/70

## 2020-12-20 ENCOUNTER — Other Ambulatory Visit: Payer: Self-pay | Admitting: Family Medicine

## 2020-12-20 DIAGNOSIS — I10 Essential (primary) hypertension: Secondary | ICD-10-CM

## 2020-12-28 ENCOUNTER — Other Ambulatory Visit: Payer: Self-pay | Admitting: Family Medicine

## 2020-12-28 DIAGNOSIS — M810 Age-related osteoporosis without current pathological fracture: Secondary | ICD-10-CM

## 2020-12-28 NOTE — Telephone Encounter (Signed)
Requested medication (s) are due for refill today: yes  Requested medication (s) are on the active medication list: yes  Last refill:  01/13/20 #12 1 RF   Future visit scheduled: yes   Notes to clinic:  needs Calcium and Vitamin D level (last level drawn 09/08/19)   Requested Prescriptions  Pending Prescriptions Disp Refills   alendronate (FOSAMAX) 70 MG tablet [Pharmacy Med Name: ALENDRONATE SODIUM 70 MG TAB] 12 tablet 1    Sig: TAKE 1 TABLET BY MOUTH EVERY 7 DAYS. TAKE WITH A FULL GLASS OF WATER ON AN EMPTY STOMACH.     Endocrinology:  Bisphosphonates Failed - 12/28/2020  9:01 AM      Failed - Ca in normal range and within 360 days    Calcium  Date Value Ref Range Status  09/08/2019 9.6 8.6 - 10.4 mg/dL Final   Calcium, Total  Date Value Ref Range Status  02/25/2013 10.3 (H) 8.5 - 10.1 mg/dL Final          Failed - Vitamin D in normal range and within 360 days    Vit D, 25-Hydroxy  Date Value Ref Range Status  09/08/2019 27 (L) 30 - 100 ng/mL Final    Comment:    Vitamin D Status         25-OH Vitamin D: . Deficiency:                    <20 ng/mL Insufficiency:             20 - 29 ng/mL Optimal:                 > or = 30 ng/mL . For 25-OH Vitamin D testing on patients on  D2-supplementation and patients for whom quantitation  of D2 and D3 fractions is required, the Quest[employer]ureD(TM) 25-OH VIT D, (D2,D3), LC/MS/MS is recommended: order  code [phone] (patients >62yrs). See Note 1 . Note 1 . For additional information, please refer to  http://education.[facility].com/faq/FAQ199  (This link is being provided for informational/ educational purposes only.)           Passed - Valid encounter within last 12 months    Recent Outpatient Visits           8 months ago Adhesive capsulitis of both shoulders   [facility] [place], Drue Stager, MD   1 year ago Type 1 diabetes mellitus with hyperglycemia, with long-term current use of insulin [facility])    [facility] [place], Drue Stager, MD   1 year ago Type 1 diabetes mellitus with hyperglycemia, with long-term current use of insulin [facility])   [facility] [place], Drue Stager, MD   1 year ago Type 1 diabetes mellitus with microalbuminuria [facility])   [facility] Steele Sizer, MD   2 years ago Type 1 diabetes mellitus with microalbuminuria [facility])   [facility] Steele Sizer, MD       Future Appointments             In 2 days Steele Sizer, MD [facility]

## 2020-12-30 ENCOUNTER — Encounter: Payer: Self-pay | Admitting: Family Medicine

## 2020-12-30 ENCOUNTER — Ambulatory Visit (INDEPENDENT_AMBULATORY_CARE_PROVIDER_SITE_OTHER): Payer: Medicare HMO | Admitting: Family Medicine

## 2020-12-30 ENCOUNTER — Other Ambulatory Visit: Payer: Self-pay

## 2020-12-30 VITALS — BP 124/70 | HR 79 | Temp 98.3°F | Resp 16 | Ht 63.0 in | Wt 131.0 lb

## 2020-12-30 DIAGNOSIS — E785 Hyperlipidemia, unspecified: Secondary | ICD-10-CM

## 2020-12-30 DIAGNOSIS — E1065 Type 1 diabetes mellitus with hyperglycemia: Secondary | ICD-10-CM

## 2020-12-30 DIAGNOSIS — E559 Vitamin D deficiency, unspecified: Secondary | ICD-10-CM

## 2020-12-30 DIAGNOSIS — R809 Proteinuria, unspecified: Secondary | ICD-10-CM

## 2020-12-30 DIAGNOSIS — Z23 Encounter for immunization: Secondary | ICD-10-CM

## 2020-12-30 DIAGNOSIS — G3184 Mild cognitive impairment, so stated: Secondary | ICD-10-CM | POA: Diagnosis not present

## 2020-12-30 DIAGNOSIS — E1029 Type 1 diabetes mellitus with other diabetic kidney complication: Secondary | ICD-10-CM | POA: Diagnosis not present

## 2020-12-30 DIAGNOSIS — I152 Hypertension secondary to endocrine disorders: Secondary | ICD-10-CM | POA: Diagnosis not present

## 2020-12-30 DIAGNOSIS — I700 Atherosclerosis of aorta: Secondary | ICD-10-CM | POA: Diagnosis not present

## 2020-12-30 DIAGNOSIS — E1159 Type 2 diabetes mellitus with other circulatory complications: Secondary | ICD-10-CM | POA: Diagnosis not present

## 2020-12-30 DIAGNOSIS — N1831 Chronic kidney disease, stage 3a: Secondary | ICD-10-CM | POA: Diagnosis not present

## 2020-12-30 DIAGNOSIS — I10 Essential (primary) hypertension: Secondary | ICD-10-CM

## 2020-12-30 DIAGNOSIS — M810 Age-related osteoporosis without current pathological fracture: Secondary | ICD-10-CM | POA: Diagnosis not present

## 2020-12-30 MED ORDER — DONEPEZIL HCL 10 MG PO TBDP: 10.0000 mg | ORAL_TABLET | Freq: Every day | ORAL | 1 refills | Status: DC

## 2020-12-30 MED ORDER — ATORVASTATIN CALCIUM 80 MG PO TABS: 80.0000 mg | ORAL_TABLET | Freq: Every day | ORAL | 1 refills | Status: DC

## 2020-12-30 MED ORDER — LOSARTAN POTASSIUM-HCTZ 100-12.5 MG PO TABS: 1.0000 | ORAL_TABLET | Freq: Every day | ORAL | 1 refills | Status: DC

## 2020-12-30 MED ORDER — AMLODIPINE BESYLATE 5 MG PO TABS: 5.0000 mg | ORAL_TABLET | Freq: Every day | ORAL | 1 refills | Status: DC

## 2020-12-30 MED ORDER — ALENDRONATE SODIUM 70 MG PO TABS: ORAL_TABLET | ORAL | 1 refills | Status: DC

## 2020-12-30 NOTE — Progress Notes (Signed)
Name: [name]   MRN: [id number]    DOB: [birth year]   Date:12/30/2020       Progress Note  Subjective  Chief Complaint  Medication Refill  HPI  [name] came in today with husband and daughter. Her husband no longer drives, so daughter had to assist with transportation.   [name] lives with her husband and grandson ( in his 23's but works full time) . Husband is the primary caregiver and he is also getting older and frail.  She needs assistance changing, bathing, preparing meals, taking medications and walking. It is getting very challenging for husband to provide adequate care, but they are not interested at placement at this time. Discussed referral to community care coordination and husband agreed.   Gait instability: she had two rounds PT at home this year, she needs assistance to walk but no falls. Daughter thinks she is afraid to fall  CKI stage III: we will recheck urine micro and CMP with GFR today, denies pruritis, she wears depends for incontinence but has good volume.   Diabetes type I:  She is seeing Dr. Honor Junes, last A1C was 9.7 % was April 2022, she had a change in medication, down to [place] 24 units daily, and pre meal insulin. Fasting today was down to 49, but usually between 70's-80's, advised to go down on T[place] to 22 units and monitor.  She has associated dyslipidemia, CKI and HTN. Taking ARB, statin therapy and reminded yearly eye exam  HTN: bp goes up and down, taking medications , no chest pain , dizziness or palpitation  Atherosclerosis of aorta: taking high dose Atorvastatin , last LDL was 88, goal is below 70 , we will recheck it today and consider switching to Crestor 40 or adding zetia.   Gallbladder sone: husband said she vomited once on Saturday, no fever or change in behavior, advised to monitor for now. Husband said she ate baked chicken and vegetables.   Cognitive Dysfunction: seen by neurologist years ago, at the time Dr. Manuella Ghazi felt secondary to  metabolic dysfunction due to uncontrolled DM, family states worse symptoms is short term memory loss. Cooperative, sometimes she gets agitated at night. No wondering. Discussed referral back to neurologist, we can also try medication and she chose the later   Patient Active Problem List   Diagnosis Date Noted   Type 1 diabetes mellitus with hyperglycemia, with long-term current use of insulin ([place]) 04/28/2018   Atherosclerosis of aorta ([place]) 04/01/2016   Aortic sclerosis 09/19/2014   Benign hypertension 09/19/2014   Calculus of gallbladder 09/19/2014   Carpal tunnel syndrome 09/19/2014   Detached retina 09/19/2014   Type 1 diabetes mellitus with microalbuminuria ([place]) 09/19/2014   Dyslipidemia 09/19/2014   Hypophosphatemia 09/19/2014   Mild cognitive impairment with memory loss 09/19/2014   Microalbuminuria 09/19/2014   OP (osteoporosis) 09/19/2014   Menopause 09/19/2014   Ptosis of eyelid 09/19/2014   Hypoglycemia associated with diabetes ([place]) 10/24/2013   Urge incontinence 08/01/2013   Hyperlipidemia due to type 1 diabetes mellitus ([place]) 08/01/2013   Vitamin D deficiency 02/24/2007    Past Surgical History:  Procedure Laterality Date   BREAST EXCISIONAL BIOPSY Left    neg   BREAST SURGERY  1970   EYE SURGERY      Family History  Problem Relation Age of Onset   Alzheimer's disease Mother    Diabetes Mother    Arthritis Mother    Diabetes Father    Hypertension Father    Hyperlipidemia Father  Stroke Brother    Multiple sclerosis Daughter     Social History   Tobacco Use   Smoking status: Never   Smokeless tobacco: Never   Tobacco comments:    smoking cessation materials not required  Substance Use Topics   Alcohol use: No    Alcohol/week: 0.0 standard drinks     Current Outpatient Medications:    aspirin EC 81 MG tablet, Take 81 mg by mouth daily., Disp: , Rfl:    blood glucose meter kit and supplies, , Disp: , Rfl:    Calcium Carbonate-Vitamin D  (CALCIUM-VITAMIN D) 600-125 MG-UNIT TABS, Take 1 tablet by mouth daily., Disp: , Rfl:    [employer] ALCOHOL SWABS PADS, USE 4 TIMES DAILY TO WIPE SKIN BEFORE USING INSULIN, Disp: 400 each, Rfl: 2   Glucagon (BAQSIMI ONE PACK) 3 MG/DOSE POWD, One spray into nose in case of severe hypoglycemia, Disp: , Rfl:    hydrALAZINE (APRESOLINE) 10 MG tablet, TAKE 1 TABLET (10 MG TOTAL) BY MOUTH 3 (THREE) TIMES DAILY AS NEEDED. BP ABOVE 150/90, Disp: 270 tablet, Rfl: 0   insulin aspart (NOVOLOG) 100 UNIT/ML FlexPen, 8 units of novolog with every meal. Add sliding scale as necessary.  Max daily dose 50 units., Disp: , Rfl:    insulin degludec (TRESIBA FLEXTOUCH) 100 UNIT/ML FlexTouch Pen, Inject 24 Units into the skin daily at 12 noon., Disp: , Rfl:    Accu-Chek Softclix Lancets lancets, , Disp: , Rfl:    alendronate (FOSAMAX) 70 MG tablet, TAKE 1 TABLET BY MOUTH EVERY 7 DAYS. TAKE WITH A FULL GLASS OF WATER ON AN EMPTY STOMACH., Disp: 12 tablet, Rfl: 1   amLODipine (NORVASC) 5 MG tablet, Take 1 tablet (5 mg total) by mouth daily., Disp: 90 tablet, Rfl: 1   atorvastatin (LIPITOR) 80 MG tablet, Take 1 tablet (80 mg total) by mouth daily., Disp: 90 tablet, Rfl: 1   losartan-hydrochlorothiazide (HYZAAR) 100-12.5 MG tablet, Take 1 tablet by mouth daily., Disp: 90 tablet, Rfl: 1  Allergies  Allergen Reactions   Colesevelam Hcl     scotomas   Daucus Carota    Penicillins Itching    I personally reviewed active problem list, medication list, allergies, family history, social history, health maintenance with the patient/caregiver today.   ROS  Constitutional: Negative for fever, positive for mild  weight change.  Respiratory: Negative for cough and shortness of breath.   Cardiovascular: Negative for chest pain or palpitations.  Gastrointestinal: Negative for abdominal pain, no bowel changes.  Musculoskeletal: positive  for gait problem/ balance problems, but no joint swelling.  Skin: Negative for rash.   Neurological: Negative for dizziness or headache.  No other specific complaints in a complete review of systems (except as listed in HPI above).   Objective  Vitals:   12/30/20 1346  BP: 124/70  Pulse: 79  Resp: 16  Temp: 98.3 F (36.8 C)  SpO2: 98%  Weight: 131 lb (59.4 kg)  Height: '5\' 3"'  (1.6 m)    Body mass index is 23.21 kg/m.  Physical Exam  Constitutional: Patient appears well-developed and well-nourished.  No distress.  HEENT: head atraumatic, normocephalic, pupils equal and reactive to light, neck supple Cardiovascular: Normal rate, regular rhythm and normal heart sounds.  No murmur heard. No BLE edema. Pulmonary/Chest: Effort normal and breath sounds normal. No respiratory distress. Abdominal: Soft.  There is no tenderness. Psychiatric:Very quiet today, but awake and alert.    PHQ2/9: Depression screen [facility] 2/9 12/30/2020 04/09/2020 09/08/2019 08/02/2019 05/15/2019  Decreased Interest 0 0 0 0 0  Down, Depressed, Hopeless 0 0 0 0 0  PHQ - 2 Score 0 0 0 0 0  Altered sleeping - - 0 0 0  Tired, decreased energy - - 0 0 0  Change in appetite - - 0 0 0  Feeling bad or failure about yourself  - - 0 0 0  Trouble concentrating - - 0 0 0  Moving slowly or fidgety/restless - - 0 0 0  Suicidal thoughts - - 0 0 0  PHQ-9 Score - - 0 0 0  Difficult doing work/chores - - - - -  Some recent data might be hidden    phq 9 is negative   Fall Risk: Fall Risk  12/30/2020 04/09/2020 09/08/2019 08/02/2019 05/15/2019  Falls in the past year? 0 0 0 0 0  Number falls in past yr: 0 0 0 0 0  Injury with Fall? 0 0 0 0 0  Risk for fall due to : Impaired balance/gait - - - -  Risk for fall due to: Comment - - - - -  Follow up Falls prevention discussed - - - -     Assessment & Plan   1. Need for immunization against influenza  - Flu vaccine HIGH DOSE PF (Fluzone High dose)  2. Stage 3a chronic kidney disease (HCC)  - CBC with Differential/Platelet - COMPLETE METABOLIC PANEL WITH  GFR - Microalbumin / creatinine urine ratio  3. Age-related osteoporosis without current pathological fracture  - alendronate (FOSAMAX) 70 MG tablet; TAKE 1 TABLET BY MOUTH EVERY 7 DAYS. TAKE WITH A FULL GLASS OF WATER ON AN EMPTY STOMACH.  Dispense: 12 tablet; Refill: 1  4. Benign hypertension  - CBC with Differential/Platelet - COMPLETE METABOLIC PANEL WITH GFR - amLODipine (NORVASC) 5 MG tablet; Take 1 tablet (5 mg total) by mouth daily.  Dispense: 90 tablet; Refill: 1 - losartan-hydrochlorothiazide (HYZAAR) 100-12.5 MG tablet; Take 1 tablet by mouth daily.  Dispense: 90 tablet; Refill: 1  5. Atherosclerosis of aorta (HCC)  - Lipid panel - atorvastatin (LIPITOR) 80 MG tablet; Take 1 tablet (80 mg total) by mouth daily.  Dispense: 90 tablet; Refill: 1  6. Dyslipidemia  - Lipid panel - atorvastatin (LIPITOR) 80 MG tablet; Take 1 tablet (80 mg total) by mouth daily.  Dispense: 90 tablet; Refill: 1  7. Vitamin D deficiency  - Lipid panel - VITAMIN D 25 Hydroxy (Vit-D Deficiency, Fractures)  8. Hypertension associated with diabetes ([place])  - microalbumin / creatinine urine ratio - AMB Referral to [facility]  9. Mild cognitive impairment with memory loss   10. Type 1 diabetes mellitus with hyperglycemia, with long-term current use of insulin (HCC)  - Microalbumin / creatinine urine ratio  11. Type 1 diabetes mellitus with microalbuminuria (HCC)  - Microalbumin / creatinine urine ratio - AMB Referral to [facility]

## 2020-12-31 ENCOUNTER — Telehealth: Payer: Self-pay | Admitting: *Deleted

## 2020-12-31 LAB — COMPLETE METABOLIC PANEL WITH GFR
AG Ratio: 1 (calc) (ref 1.0–2.5)
ALT: 12 U/L (ref 6–29)
AST: 16 U/L (ref 10–35)
Albumin: 3.6 g/dL (ref 3.6–5.1)
Alkaline phosphatase (APISO): 65 U/L (ref 37–153)
BUN: 24 mg/dL (ref 7–25)
CO2: 28 mmol/L (ref 20–32)
Calcium: 9.7 mg/dL (ref 8.6–10.4)
Chloride: 101 mmol/L (ref 98–110)
Creat: 0.67 mg/dL (ref 0.60–1.00)
Globulin: 3.6 g/dL (calc) (ref 1.9–3.7)
Glucose, Bld: 175 mg/dL — ABNORMAL HIGH (ref 65–99)
Potassium: 3.9 mmol/L (ref 3.5–5.3)
Sodium: 139 mmol/L (ref 135–146)
Total Bilirubin: 0.5 mg/dL (ref 0.2–1.2)
Total Protein: 7.2 g/dL (ref 6.1–8.1)
eGFR: 90 mL/min/{1.73_m2} (ref >=60)

## 2020-12-31 LAB — LIPID PANEL
Cholesterol: 132 mg/dL (ref <200)
HDL: 35 mg/dL — ABNORMAL LOW (ref >=50)
LDL Cholesterol (Calc): 83 mg/dL (calc)
Non-HDL Cholesterol (Calc): 97 mg/dL (calc) (ref <130)
Total CHOL/HDL Ratio: 3.8 (calc) (ref <5.0)
Triglycerides: 63 mg/dL (ref <150)

## 2020-12-31 LAB — CBC WITH DIFFERENTIAL/PLATELET
Absolute Monocytes: 639 cells/uL (ref 200–950)
Basophils Absolute: 62 cells/uL (ref 0–200)
Basophils Relative: 0.8 %
Eosinophils Absolute: 123 cells/uL (ref 15–500)
Eosinophils Relative: 1.6 %
HCT: 37 % (ref 35.0–45.0)
Hemoglobin: 11.8 g/dL (ref 11.7–15.5)
Lymphs Abs: 2641 cells/uL (ref 850–3900)
MCH: 27.9 pg (ref 27.0–33.0)
MCHC: 31.9 g/dL — ABNORMAL LOW (ref 32.0–36.0)
MCV: 87.5 fL (ref 80.0–100.0)
MPV: 10.9 fL (ref 7.5–12.5)
Monocytes Relative: 8.3 %
Neutro Abs: 4235 cells/uL (ref 1500–7800)
Neutrophils Relative %: 55 %
Platelets: 286 10*3/uL (ref 140–400)
RBC: 4.23 10*6/uL (ref 3.80–5.10)
RDW: 13 % (ref 11.0–15.0)
Total Lymphocyte: 34.3 %
WBC: 7.7 10*3/uL (ref 3.8–10.8)

## 2020-12-31 LAB — MICROALBUMIN / CREATININE URINE RATIO
Creatinine, Urine: 94 mg/dL (ref 20–275)
Microalb Creat Ratio: 6 mcg/mg creat (ref <30)
Microalb, Ur: 0.6 mg/dL

## 2020-12-31 LAB — HEMOGLOBIN A1C
Hgb A1c MFr Bld: 8.7 % of total Hgb — ABNORMAL HIGH (ref <5.7)
Mean Plasma Glucose: 203 mg/dL
eAG (mmol/L): 11.2 mmol/L

## 2020-12-31 LAB — VITAMIN D 25 HYDROXY (VIT D DEFICIENCY, FRACTURES): Vit D, 25-Hydroxy: 45 ng/mL (ref 30–100)

## 2020-12-31 NOTE — Chronic Care Management (AMB) (Signed)
  Chronic Care Management   Outreach Note  12/31/2020 Name: [name] MRN: [id number] DOB: March 24, 1940  [name] is a 80 y.o. year old female who is a primary care patient of Steele Sizer, MD. I reached out to [name] by phone today in response to a referral sent by Ms. Estrella Deeds primary care provider.  An unsuccessful telephone outreach was attempted today. The patient was referred to the case management team for assistance with care management and care coordination.   Follow Up Plan: A HIPAA compliant phone message was left for the patient providing contact information and requesting a return call.  If patient returns call to provider office, please advise to call Embedded Care Management Care Guide Macala Baldonado at [place], [place] [facility]  Direct Dial: [phone]

## 2021-01-02 NOTE — Chronic Care Management (AMB) (Signed)
  Chronic Care Management   Note  01/02/2021 Name: Lydian Chavous MRN: [id number] DOB: 03-07-1941  Marga Gramajo is a 80 y.o. year old female who is a primary care patient of Steele Sizer, MD. I reached out to [name] by phone today in response to a referral sent by Ms. Estrella Deeds PCP.  Ms. Lieber was given information about Chronic Care Management services today including:  CCM service includes personalized support from designated clinical staff supervised by her physician, including individualized plan of care and coordination with other care providers 24/7 contact phone numbers for assistance for urgent and routine care needs. Service will only be billed when office clinical staff spend 20 minutes or more in a month to coordinate care. Only one practitioner may furnish and bill the service in a calendar month. The patient may stop CCM services at any time (effective at the end of the month) by phone call to the office staff. The patient is responsible for co-pay (up to 20% after annual deductible is met) if co-pay is required by the individual health plan.   Patient agreed to services and verbal consent obtained.   Follow up plan: Telephone appointment with care management team member scheduled for: 01/13/2021 and 01/16/2021  Julian Hy, [place] [facility]  Direct Dial: [phone]

## 2021-01-13 ENCOUNTER — Telehealth: Payer: Medicare HMO

## 2021-01-14 ENCOUNTER — Telehealth: Payer: Medicare HMO

## 2021-01-14 ENCOUNTER — Ambulatory Visit (INDEPENDENT_AMBULATORY_CARE_PROVIDER_SITE_OTHER): Payer: Medicare HMO | Admitting: *Deleted

## 2021-01-14 DIAGNOSIS — N1831 Chronic kidney disease, stage 3a: Secondary | ICD-10-CM

## 2021-01-14 DIAGNOSIS — G3184 Mild cognitive impairment, so stated: Secondary | ICD-10-CM

## 2021-01-14 DIAGNOSIS — E1065 Type 1 diabetes mellitus with hyperglycemia: Secondary | ICD-10-CM

## 2021-01-15 NOTE — Chronic Care Management (AMB) (Signed)
Chronic Care Management    Clinical Social Work Note  01/15/2021 Name: [name] MRN: [id number] DOB: 12-Apr-1940  [name] is a 80 y.o. year old female who is a primary care patient of Steele Sizer, MD. The CCM team was consulted to assist the patient with chronic disease management and/or care coordination needs related to: [employer]  and Level of Care Concerns.   Collaboration with patient's spouse and daughter  for initial visit in response to provider referral for social work chronic care management and care coordination services.   Consent to Services:  The patient was given the following information about Chronic Care Management services today, agreed to services, and gave verbal consent: 1. CCM service includes personalized support from designated clinical staff supervised by the primary care provider, including individualized plan of care and coordination with other care providers 2. 24/7 contact phone numbers for assistance for urgent and routine care needs. 3. Service will only be billed when office clinical staff spend 20 minutes or more in a month to coordinate care. 4. Only one practitioner may furnish and bill the service in a calendar month. 5.The patient may stop CCM services at any time (effective at the end of the month) by phone call to the office staff. 6. The patient will be responsible for cost sharing (co-pay) of up to 20% of the service fee (after annual deductible is met). Patient agreed to services and consent obtained.  Patient agreed to services and consent obtained.   Assessment: Review of patient past medical history, allergies, medications, and health status, including review of relevant consultants reports was performed today as part of a comprehensive evaluation and provision of chronic care management and care coordination services.     SDOH (Social Determinants of Health) assessments and interventions performed:    Advanced Directives  Status: Not addressed in this encounter.  CCM Care Plan  Allergies  Allergen Reactions   Colesevelam Hcl     scotomas   Daucus Carota    Penicillins Itching    Outpatient Encounter Medications as of 01/14/2021  Medication Sig   Accu-Chek Softclix Lancets lancets    alendronate (FOSAMAX) 70 MG tablet TAKE 1 TABLET BY MOUTH EVERY 7 DAYS. TAKE WITH A FULL GLASS OF WATER ON AN EMPTY STOMACH.   amLODipine (NORVASC) 5 MG tablet Take 1 tablet (5 mg total) by mouth daily.   aspirin EC 81 MG tablet Take 81 mg by mouth daily.   atorvastatin (LIPITOR) 80 MG tablet Take 1 tablet (80 mg total) by mouth daily.   blood glucose meter kit and supplies    Calcium Carbonate-Vitamin D (CALCIUM-VITAMIN D) 600-125 MG-UNIT TABS Take 1 tablet by mouth daily.   CVS ALCOHOL SWABS PADS USE 4 TIMES DAILY TO WIPE SKIN BEFORE USING INSULIN   donepezil (ARICEPT ODT) 10 MG disintegrating tablet Take 1 tablet (10 mg total) by mouth at bedtime.   Glucagon (BAQSIMI ONE PACK) 3 MG/DOSE POWD One spray into nose in case of severe hypoglycemia   hydrALAZINE (APRESOLINE) 10 MG tablet TAKE 1 TABLET (10 MG TOTAL) BY MOUTH 3 (THREE) TIMES DAILY AS NEEDED. BP ABOVE 150/90   insulin aspart (NOVOLOG) 100 UNIT/ML FlexPen 8 units of novolog with every meal. Add sliding scale as necessary.  Max daily dose 50 units.   insulin degludec (TRESIBA FLEXTOUCH) 100 UNIT/ML FlexTouch Pen Inject 24 Units into the skin daily at 12 noon.   losartan-hydrochlorothiazide (HYZAAR) 100-12.5 MG tablet Take 1 tablet by mouth daily.   No  facility-administered encounter medications on file as of 01/14/2021.    Patient Active Problem List   Diagnosis Date Noted   Type 1 diabetes mellitus with hyperglycemia, with long-term current use of insulin ([place]) 04/28/2018   Atherosclerosis of aorta ([place]) 04/01/2016   Aortic sclerosis 09/19/2014   Benign hypertension 09/19/2014   Calculus of gallbladder 09/19/2014   Carpal tunnel syndrome 09/19/2014   Detached  retina 09/19/2014   Type 1 diabetes mellitus with microalbuminuria ([place]) 09/19/2014   Dyslipidemia 09/19/2014   Hypophosphatemia 09/19/2014   Mild cognitive impairment with memory loss 09/19/2014   Microalbuminuria 09/19/2014   OP (osteoporosis) 09/19/2014   Menopause 09/19/2014   Ptosis of eyelid 09/19/2014   Hypoglycemia associated with diabetes ([place]) 10/24/2013   Urge incontinence 08/01/2013   Hyperlipidemia due to type 1 diabetes mellitus ([place]) 08/01/2013   Vitamin D deficiency 02/24/2007    Conditions to be addressed/monitored: HTN and DMII; ADL IADL limitations and Memory Deficits  Care Plan : General Social Work (Adult)  Updates made by [employer], Darla Lesches, LCSW since 01/15/2021 12:00 AM     Problem: CHL AMB "PATIENT-SPECIFIC PROBLEM"   Note:   CARE PLAN ENTRY (see longitudinal plan of care for additional care plan information)  Current Barriers:  Patient with CAD, HTN, DM, and memory deficits  in need of assistance with connection to community resources  Knowledge deficits and need for support, education and care coordination related to community resources support  Level of care concerns and ADL IADL limitations   Clinical Goal(s)  Over the next 90 days, patient will work with care management team member to address concerns related to resources for in home support  Interventions provided by LCSW:  Assessed patient's care coordination needs related to in home care needs and discussed ongoing care management follow up  Confirmed that patient currently has an aid 4 hours a day-4 days a week bur may be leaving for a full time position Confirmed that patient's spouse will potentially need assistance with providing care for patient if aid leaves the position Provided patient's spouse with information about alternative in home care options Advised patient's spouse and daughter to check in to possible resources covered by insurance-this social worker agreeable to follow up as  well Collaborated with appropriate clinical care team members regarding patient needs Active listening / Reflection utilized  Emotional Support Provided Caregiver stress acknowledged     Patient Self Care Activities & Deficits:  Patient is unable to independently navigate community resource options without care coordination support  Acknowledges deficits and is motivated to resolve concern  Unable to perform ADLs independently Unable to perform IADLs independently Attends all scheduled provider appointments  Initial goal documentation        Follow Up Plan: SW will follow up with patient by phone over the next 14 business days      [place], [place] Worker  [facility] Care Management [phone]

## 2021-01-15 NOTE — Patient Instructions (Signed)
Visit Information  PATIENT GOALS/PLAN OF CARE:  Care Plan : General Social Work (Adult)  Updates made by [employer], Nichole Lesches, LCSW since 01/15/2021 12:00 AM     Problem: CHL AMB "PATIENT-SPECIFIC PROBLEM"   Note:   CARE PLAN ENTRY (see longitudinal plan of care for additional care plan information)  Current Barriers:  Patient with CAD, HTN, DM, and memory deficits  in need of assistance with connection to community resources  Knowledge deficits and need for support, education and care coordination related to community resources support  Level of care concerns and ADL IADL limitations   Clinical Goal(s)  Over the next 90 days, patient will work with care management team member to address concerns related to resources for in home support  Interventions provided by LCSW:  Assessed patient's care coordination needs related to in home care needs and discussed ongoing care management follow up  Confirmed that patient currently has an aid 4 hours a day-4 days a week bur may be leaving for a full time position Confirmed that patient's spouse will potentially need assistance with providing care for patient if aid leaves the position Provided patient's spouse with information about alternative in home care options Advised patient's spouse and daughter to check in to possible resources covered by insurance-this social worker agreeable to follow up as well Collaborated with appropriate clinical care team members regarding patient needs Active listening / Reflection utilized  Emotional Support Provided Caregiver stress acknowledged     Patient Self Care Activities & Deficits:  Patient is unable to independently navigate community resource options without care coordination support  Acknowledges deficits and is motivated to resolve concern  Unable to perform ADLs independently Unable to perform IADLs independently Attends all scheduled provider appointments  Initial goal documentation        [name] was given information about Care Management services by the embedded care coordination team including:  Care Management services include personalized support from designated clinical staff supervised by her physician, including individualized plan of care and coordination with other care providers 24/7 contact phone numbers for assistance for urgent and routine care needs. The patient may stop CCM services at any time (effective at the end of the month) by phone call to the office staff.  Patient agreed to services and verbal consent obtained.   The patient verbalized understanding of instructions, educational materials, and care plan provided today and declined offer to receive copy of patient instructions, educational materials, and care plan.   Telephone follow up appointment with care management team member scheduled for: 01/29/21  [name], [place] Worker  [facility] Care Management [phone]

## 2021-01-16 ENCOUNTER — Ambulatory Visit: Payer: Medicare HMO

## 2021-01-16 DIAGNOSIS — E1159 Type 2 diabetes mellitus with other circulatory complications: Secondary | ICD-10-CM

## 2021-01-16 DIAGNOSIS — I152 Hypertension secondary to endocrine disorders: Secondary | ICD-10-CM

## 2021-01-16 DIAGNOSIS — E785 Hyperlipidemia, unspecified: Secondary | ICD-10-CM

## 2021-01-16 DIAGNOSIS — Z9181 History of falling: Secondary | ICD-10-CM

## 2021-01-16 DIAGNOSIS — E1065 Type 1 diabetes mellitus with hyperglycemia: Secondary | ICD-10-CM

## 2021-01-16 NOTE — Chronic Care Management (AMB) (Addendum)
Chronic Care Management   CCM RN Visit Note  01/16/2021 Name: [name] MRN: [id number] DOB: 25-Nov-1940  Subjective: [name] is a 80 y.o. year old female who is a primary care patient of Steele Sizer, MD. The care management team was consulted for assistance with disease management and care coordination needs.    Engaged with patient's spouse and patient's caregiver/daughter Dawn by telephone for initial visit in response to provider referral for case management and care coordination services.   Consent to Services:  The patient's caregiver was given the following information about Chronic Care Management services: 1. CCM service includes personalized support from designated clinical staff supervised by the primary care provider, including individualized plan of care and coordination with other care providers 2. 24/7 contact phone numbers for assistance for urgent and routine care needs. 3. Service will only be billed when office clinical staff spend 20 minutes or more in a month to coordinate care. 4. Only one practitioner may furnish and bill the service in a calendar month. 5.The patient may stop CCM services at any time (effective at the end of the month) by phone call to the office staff. 6. The patient will be responsible for cost sharing (co-pay) of up to 20% of the service fee (after annual deductible is met). Patient's caregiver agreed to services and consent obtained.   Assessment: Review of patient past medical history, allergies, medications, health status, including review of consultants reports, laboratory and other test data, was performed as part of comprehensive evaluation and provision of chronic care management services.   SDOH (Social Determinants of Health) assessments and interventions performed:  SDOH Interventions    Flowsheet Row Most Recent Value  SDOH Interventions   Food Insecurity Interventions Intervention Not Indicated  Transportation Interventions  Intervention Not Indicated        CCM Care Plan  Allergies  Allergen Reactions   Colesevelam Hcl     scotomas   Daucus Carota    Penicillins Itching    Outpatient Encounter Medications as of 01/16/2021  Medication Sig Note   amLODipine (NORVASC) 5 MG tablet Take 1 tablet (5 mg total) by mouth daily.    aspirin EC 81 MG tablet Take 81 mg by mouth daily.    atorvastatin (LIPITOR) 80 MG tablet Take 1 tablet (80 mg total) by mouth daily.    Accu-Chek Softclix Lancets lancets     alendronate (FOSAMAX) 70 MG tablet TAKE 1 TABLET BY MOUTH EVERY 7 DAYS. TAKE WITH A FULL GLASS OF WATER ON AN EMPTY STOMACH.    blood glucose meter kit and supplies     Calcium Carbonate-Vitamin D (CALCIUM-VITAMIN D) 600-125 MG-UNIT TABS Take 1 tablet by mouth daily.    [employer] ALCOHOL SWABS PADS USE 4 TIMES DAILY TO WIPE SKIN BEFORE USING INSULIN    donepezil (ARICEPT ODT) 10 MG disintegrating tablet Take 1 tablet (10 mg total) by mouth at bedtime.    Glucagon (BAQSIMI ONE PACK) 3 MG/DOSE POWD One spray into nose in case of severe hypoglycemia    hydrALAZINE (APRESOLINE) 10 MG tablet TAKE 1 TABLET (10 MG TOTAL) BY MOUTH 3 (THREE) TIMES DAILY AS NEEDED. BP ABOVE 150/90    insulin aspart (NOVOLOG) 100 UNIT/ML FlexPen 8 units of novolog with every meal. Add sliding scale as necessary.  Max daily dose 50 units.    insulin degludec (TRESIBA FLEXTOUCH) 100 UNIT/ML FlexTouch Pen Inject 24 Units into the skin daily at 12 noon. 01/16/2021: [name] decreased to 22 units   losartan-hydrochlorothiazide (  HYZAAR) 100-12.5 MG tablet Take 1 tablet by mouth daily.    No facility-administered encounter medications on file as of 01/16/2021.    Patient Active Problem List   Diagnosis Date Noted   Type 1 diabetes mellitus with hyperglycemia, with long-term current use of insulin ([place]) 04/28/2018   Atherosclerosis of aorta ([place]) 04/01/2016   Aortic sclerosis 09/19/2014   Benign hypertension 09/19/2014   Calculus of gallbladder  09/19/2014   Carpal tunnel syndrome 09/19/2014   Detached retina 09/19/2014   Type 1 diabetes mellitus with microalbuminuria ([place]) 09/19/2014   Dyslipidemia 09/19/2014   Hypophosphatemia 09/19/2014   Mild cognitive impairment with memory loss 09/19/2014   Microalbuminuria 09/19/2014   OP (osteoporosis) 09/19/2014   Menopause 09/19/2014   Ptosis of eyelid 09/19/2014   Hypoglycemia associated with diabetes ([place]) 10/24/2013   Urge incontinence 08/01/2013   Hyperlipidemia due to type 1 diabetes mellitus ([place]) 08/01/2013   Vitamin D deficiency 02/24/2007    Conditions to be addressed/monitored:HTN, HLD, DMI, and Fall Risk  Patient Care Plan: RN Care Management Plan of Care     Problem Identified: HTN, HLD, DM, Dementia and Fall Risk      Long-Range Goal: Disease Progression Prevented or Minimized   Start Date: 01/16/2021  Expected End Date: 04/16/2021  Priority: High  Note:   Current Barriers:  Chronic Disease Management support and education needs related to HTN, HLD, DMI, Dementia, and Fall Risk.  RNCM Clinical Goal(s):  Patient's caregivers will assist with adherence to prescribed treatment plan for HTN, HLD, DMI, Dementia, and Fall Risk through collaboration with the provider RN Care Manager and the care management team.  Interventions: 1:1 collaboration with primary care provider regarding development and update of comprehensive plan of care as evidenced by provider attestation and co-signature Inter-disciplinary care team collaboration (see longitudinal plan of care) Evaluation of current treatment plan related to  self management and patient's adherence to plan as established by provider   Diabetes Interventions:  (Status:  New goal.) Long Term Goal A1c goal: <7%  Lab Results  Component Value Date   HGBA1C 8.7 (H) 12/30/2020  Reviewed medications. Family advised to continue administering oral medications and injections as prescribed. Advised to notify the care  management team with concerns regarding medication management or prescription cost. Provided information regarding importance of consistent blood glucose monitoring. Spouse reports monitoring levels each morning and as needed during the day. Reports readings usually range from the 150's to low 200's after meals. Fasting reading have ranged from the 80's to 120's. Reports they previously requested a Dexcom G6 however the insurance provider would not provide coverage. Encouraged to continue monitoring levels and maintain a log. Reviewed s/sx of hypoglycemia and hyperglycemia along with recommended interventions. Discussed nutritional intake and importance of serving meals that comply with the ADA/modified carbohydrate diet recommendations. Discussed and offered referrals for available Diabetes education classes. Mrs. Papadakis's A1C is currently not at goal. Family reports improvements with management. Agreed to update the care management team if additional resources are needed. Advised to ensure patient attends appointments with the Endocrinology team as scheduled. Discussed importance of completing recommended DM preventive care. Family reports Mrs. Sinnett's eye and foot exams are up to date.   Falls Interventions:  (Status:  New goal.) Long Term Goal Reviewed medications and discussed potential side effects such as dizziness, lightheadedness and frequent urination. Provided information regarding safety and fall prevention. Family reports she is currently using a rollator walker with all ambulation. Her activity has declined due to unsteady  gait and increased weakness. Family advised to continue monitoring closely and assist with all ambulation  Discussed functional status. Family reports her activity tolerance has declined. Her spouse is in the home and available to assist with ADL's as needed. She has very good family support. Agreed to update the care management team with in-home needs.   Hyperlipidemia  Interventions:  (Status:  New goal.) Long Term Goal Lab Results  Component Value Date   CHOL 132 12/30/2020   HDL 35 (L) 12/30/2020   LDLCALC 83 12/30/2020   TRIG 63 12/30/2020   CHOLHDL 3.8 12/30/2020    Medications Reviewed. Spouse and caregiver advised to continue monitoring as prescribed. Advised to notify provider if Mrs. Pullen is unable to tolerate the prescribed regimen. Reviewed established cholesterol goals  Counseled on importance of regular laboratory monitoring as prescribed Reviewed importance of limiting foods high in cholesterol  Hypertension Interventions:  (Status:  New goal.) Long Term Goal Last practice recorded BP readings:  BP Readings from Last 3 Encounters:  12/30/20 124/70  04/09/20 136/70  09/08/19 138/68  Most recent eGFR/CrCl:  Lab Results  Component Value Date   EGFR 90 12/30/2020    No components found for: CRCL  Reviewed medications and indications for use. Mrs. Catlin's spouse and daughter are assisting with medication preparation. Advised to continue administering as prescribed. Provided information regarding established blood pressure parameters along with indications for notifying a provider. Spouse monitors her BP daily. Reports systolic readings have ranged in the 130's and 140's. Diastolic readings have ranged in the 70's. Reading today was 142/77. Advised to continue monitoring and recording readings.  Discussed compliance with recommended cardiac prudent diet. Family prepares meals. Reports Mrs. Shumard has a very good appetite and maintains adequate nutrition. Encouraged to read nutrition labels and avoid serving highly processed foods when possible. Discussed complications of uncontrolled blood pressure.  Reviewed s/sx of heart attack, stroke and worsening symptoms that require immediate medical attention.   Patient Goals (Caregivers will Assist): Take all medications as prescribed Attend all scheduled provider appointments Call pharmacy for  medication refills 3-7 days in advance of running out of medications Call provider office for new concerns or questions    Follow Up Plan:   Will follow up next month       PLAN A member of the care management team will follow up next month.   Crist[facility] Care Management [facility] [phone]

## 2021-01-16 NOTE — Patient Instructions (Addendum)
Thank you for allowing the Chronic Care Management team to participate in your care.    Patient Care Plan: RN Care Management Plan of Care     Problem Identified: HTN, HLD, DM, Dementia and Fall Risk      Long-Range Goal: Disease Progression Prevented or Minimized   Start Date: 01/16/2021  Expected End Date: 04/16/2021  Priority: High  Note:   Current Barriers:  Chronic Disease Management support and education needs related to HTN, HLD, DMI, Dementia, and Fall Risk.  RNCM Clinical Goal(s):  Patient's caregivers will assist with adherence to prescribed treatment plan for HTN, HLD, DMI, Dementia, and Fall Risk through collaboration with the provider RN Care Manager and the care management team.  Interventions: 1:1 collaboration with primary care provider regarding development and update of comprehensive plan of care as evidenced by provider attestation and co-signature Inter-disciplinary care team collaboration (see longitudinal plan of care) Evaluation of current treatment plan related to  self management and patient's adherence to plan as established by provider   Diabetes Interventions:  (Status:  New goal.) Long Term Goal A1c goal: <7%  Lab Results  Component Value Date   HGBA1C 8.7 (H) 12/30/2020  Reviewed medications. Family advised to continue administering oral medications and injections as prescribed. Advised to notify the care management team with concerns regarding medication management or prescription cost. Provided information regarding importance of consistent blood glucose monitoring. Spouse reports monitoring levels each morning and as needed during the day. Reports readings usually range from the 150's to low 200's after meals. Fasting reading have ranged from the 80's to 120's. Reports they previously requested a Dexcom G6 however the insurance provider would not provide coverage. Encouraged to continue monitoring levels and maintain a log. Reviewed s/sx of  hypoglycemia and hyperglycemia along with recommended interventions. Discussed nutritional intake and importance of serving meals that comply with the ADA/modified carbohydrate diet recommendations. Discussed and offered referrals for available Diabetes education classes. [name]'s A1C is currently not at goal. Family reports improvements with management. Agreed to update the care management team if additional resources are needed. Advised to ensure patient attends appointments with the Endocrinology team as scheduled. Discussed importance of completing recommended DM preventive care. Family reports [name]'s eye and foot exams are up to date.   Falls Interventions:  (Status:  New goal.) Long Term Goal Reviewed medications and discussed potential side effects such as dizziness, lightheadedness and frequent urination. Provided information regarding safety and fall prevention. Family reports she is currently using a rollator walker with all ambulation. Her activity has declined due to unsteady gait and increased weakness. Family advised to continue monitoring closely and assist with all ambulation  Discussed functional status. Family reports her activity tolerance has declined. Her spouse is in the home and available to assist with ADL's as needed. She has very good family support. Agreed to update the care management team with in-home needs.   Hyperlipidemia Interventions:  (Status:  New goal.) Long Term Goal Lab Results  Component Value Date   CHOL 132 12/30/2020   HDL 35 (L) 12/30/2020   LDLCALC 83 12/30/2020   TRIG 63 12/30/2020   CHOLHDL 3.8 12/30/2020    Medications Reviewed. Spouse and caregiver advised to continue monitoring as prescribed. Advised to notify provider if [name] is unable to tolerate the prescribed regimen. Reviewed established cholesterol goals  Counseled on importance of regular laboratory monitoring as prescribed Reviewed importance of limiting foods high in  cholesterol  Hypertension Interventions:  (Status:  New goal.) Long Term Goal Last practice recorded BP readings:  BP Readings from Last 3 Encounters:  12/30/20 124/70  04/09/20 136/70  09/08/19 138/68  Most recent eGFR/CrCl:  Lab Results  Component Value Date   EGFR 90 12/30/2020    No components found for: CRCL  Reviewed medications and indications for use. [name]'s spouse and daughter are assisting with medication preparation. Advised to continue administering as prescribed. Provided information regarding established blood pressure parameters along with indications for notifying a provider. Spouse monitors her BP daily. Reports systolic readings have ranged in the 130's and 140's. Diastolic readings have ranged in the 70's. Reading today was 142/77. Advised to continue monitoring and recording readings.  Discussed compliance with recommended cardiac prudent diet. Family prepares meals. Reports [name] has a very good appetite and maintains adequate nutrition. Encouraged to read nutrition labels and avoid serving highly processed foods when possible. Discussed complications of uncontrolled blood pressure.  Reviewed s/sx of heart attack, stroke and worsening symptoms that require immediate medical attention.   Patient Goals (Caregivers will Assist): Take all medications as prescribed Attend all scheduled provider appointments Call pharmacy for medication refills 3-7 days in advance of running out of medications Call provider office for new concerns or questions    Follow Up Plan:   Will follow up next month     [name]'s spouse and daughter [name] verbalized understanding of the information discussed during the telephonic outreach. Declined need for mailed/printed instructions. A member of the care management team will follow up next month.   [name] Care Management [facility] [phone]

## 2021-01-28 ENCOUNTER — Ambulatory Visit: Payer: Medicare HMO | Admitting: *Deleted

## 2021-01-28 DIAGNOSIS — I152 Hypertension secondary to endocrine disorders: Secondary | ICD-10-CM

## 2021-01-28 DIAGNOSIS — E1065 Type 1 diabetes mellitus with hyperglycemia: Secondary | ICD-10-CM

## 2021-01-28 DIAGNOSIS — G3184 Mild cognitive impairment, so stated: Secondary | ICD-10-CM

## 2021-01-28 NOTE — Patient Instructions (Addendum)
Visit Information  Thank you for taking time to visit with me today. Please don't hesitate to contact me if I can be of assistance to you before our next scheduled telephone appointment.  Following are the goals we discussed today:  - follow-up on any referrals for help I am given - think ahead to make sure my need does not become an emergency - have a back-up plan  Our next appointment is by telephone on 02/11/21 at 10am  Please call the care guide team at [phone] if you need to cancel or reschedule your appointment.   Please call 911 call the [place] National Suicide Prevention Lifeline: 434-063-8216 if you are experiencing a Mental Health or [place] or need someone to talk to.   Following is a copy of your full plan of care:  Care Plan : General Social Work (Adult)  Updates made by [employer], Darla Lesches, LCSW since 01/28/2021 12:00 AM     Problem: CHL AMB "PATIENT-SPECIFIC PROBLEM"   Note:   CARE PLAN ENTRY (see longitudinal plan of care for additional care plan information)  Current Barriers:  Patient with CAD, HTN, DM, and memory deficits  in need of assistance with connection to community resources  Knowledge deficits and need for support, education and care coordination related to community resources support  Level of care concerns and ADL IADL limitations   Clinical Goal(s)  Over the next 90 days, patient will work with care management team member to address concerns related to resources for in home support  Interventions provided by LCSW:  Assessed patient's care coordination needs related to in home care needs and discussed ongoing care management follow up  Confirmed that patient no longer has her in home aid provider Confirmed that patient's spouse will now need assistance with providing care for patient due to current aids departure Initiated discussion of  alternative in home care options, however patient requested that this social worker speak with  his daughter Phone call to patient's daughter, provided patient with resources for private duty care as well personal in home care program provided with patient's insurance company Active listening utilized  Emotional Support Provided Caregiver stress acknowledged     Patient Self Care Activities & Deficits:  Patient is unable to independently navigate community resource options without care coordination support  Acknowledges deficits and is motivated to resolve concern  Unable to perform ADLs independently Unable to perform IADLs independently Attends all scheduled provider appointments  Initial goal documentation       Ms. Wilmot was given information about Care Management services by the embedded care coordination team including:  Care Management services include personalized support from designated clinical staff supervised by her physician, including individualized plan of care and coordination with other care providers 24/7 contact phone numbers for assistance for urgent and routine care needs. The patient may stop CCM services at any time (effective at the end of the month) by phone call to the office staff.  Patient agreed to services and verbal consent obtained.   The patient verbalized understanding of instructions, educational materials, and care plan provided today and declined offer to receive copy of patient instructions, educational materials, and care plan.   Telephone follow up appointment with care management team member scheduled for: 02/11/21   Elliot Gurney, [place] Worker  [facility] Care Management [phone]

## 2021-01-28 NOTE — Chronic Care Management (AMB) (Signed)
Chronic Care Management    Clinical Social Work Note  01/28/2021 Name: [name] MRN: [id number] DOB: 1940-11-10  [name] is a 80 y.o. year old female who is a primary care patient of Steele Sizer, MD. The CCM team was consulted to assist the patient with chronic disease management and/or care coordination needs related to: [employer] .   Collaboration with patient's spouse and daughter  for follow up visit in response to provider referral for social work chronic care management and care coordination services.   Consent to Services:  The patient was given information about Chronic Care Management services, agreed to services, and gave verbal consent prior to initiation of services.  Please see initial visit note for detailed documentation.   Patient agreed to services and consent obtained.   Assessment: Review of patient past medical history, allergies, medications, and health status, including review of relevant consultants reports was performed today as part of a comprehensive evaluation and provision of chronic care management and care coordination services.     SDOH (Social Determinants of Health) assessments and interventions performed:    Advanced Directives Status: Not addressed in this encounter.  CCM Care Plan  Allergies  Allergen Reactions   Colesevelam Hcl     scotomas   Daucus Carota    Penicillins Itching    Outpatient Encounter Medications as of 01/28/2021  Medication Sig Note   Accu-Chek Softclix Lancets lancets     alendronate (FOSAMAX) 70 MG tablet TAKE 1 TABLET BY MOUTH EVERY 7 DAYS. TAKE WITH A FULL GLASS OF WATER ON AN EMPTY STOMACH.    amLODipine (NORVASC) 5 MG tablet Take 1 tablet (5 mg total) by mouth daily.    aspirin EC 81 MG tablet Take 81 mg by mouth daily.    atorvastatin (LIPITOR) 80 MG tablet Take 1 tablet (80 mg total) by mouth daily.    blood glucose meter kit and supplies     Calcium Carbonate-Vitamin D (CALCIUM-VITAMIN  D) 600-125 MG-UNIT TABS Take 1 tablet by mouth daily.    [employer] ALCOHOL SWABS PADS USE 4 TIMES DAILY TO WIPE SKIN BEFORE USING INSULIN    donepezil (ARICEPT ODT) 10 MG disintegrating tablet Take 1 tablet (10 mg total) by mouth at bedtime.    Glucagon (BAQSIMI ONE PACK) 3 MG/DOSE POWD One spray into nose in case of severe hypoglycemia    hydrALAZINE (APRESOLINE) 10 MG tablet TAKE 1 TABLET (10 MG TOTAL) BY MOUTH 3 (THREE) TIMES DAILY AS NEEDED. BP ABOVE 150/90    insulin aspart (NOVOLOG) 100 UNIT/ML FlexPen 8 units of novolog with every meal. Add sliding scale as necessary.  Max daily dose 50 units.    insulin degludec (TRESIBA FLEXTOUCH) 100 UNIT/ML FlexTouch Pen Inject 24 Units into the skin daily at 12 noon. 01/16/2021: [name] decreased to 22 units   losartan-hydrochlorothiazide (HYZAAR) 100-12.5 MG tablet Take 1 tablet by mouth daily.    No facility-administered encounter medications on file as of 01/28/2021.    Patient Active Problem List   Diagnosis Date Noted   Type 1 diabetes mellitus with hyperglycemia, with long-term current use of insulin ([place]) 04/28/2018   Atherosclerosis of aorta ([place]) 04/01/2016   Aortic sclerosis 09/19/2014   Benign hypertension 09/19/2014   Calculus of gallbladder 09/19/2014   Carpal tunnel syndrome 09/19/2014   Detached retina 09/19/2014   Type 1 diabetes mellitus with microalbuminuria ([place]) 09/19/2014   Dyslipidemia 09/19/2014   Hypophosphatemia 09/19/2014   Mild cognitive impairment with memory loss 09/19/2014   Microalbuminuria  09/19/2014   OP (osteoporosis) 09/19/2014   Menopause 09/19/2014   Ptosis of eyelid 09/19/2014   Hypoglycemia associated with diabetes ([place]) 10/24/2013   Urge incontinence 08/01/2013   Hyperlipidemia due to type 1 diabetes mellitus ([place]) 08/01/2013   Vitamin D deficiency 02/24/2007    Conditions to be addressed/monitored: HTN and DMII; ADL IADL limitations and Memory Deficits   Care Plan : General Social Work (Adult)   Updates made by [employer], Darla Lesches, LCSW since 01/28/2021 12:00 AM     Problem: CHL AMB "PATIENT-SPECIFIC PROBLEM"   Note:   CARE PLAN ENTRY (see longitudinal plan of care for additional care plan information)  Current Barriers:  Patient with CAD, HTN, DM, and memory deficits  in need of assistance with connection to community resources  Knowledge deficits and need for support, education and care coordination related to community resources support  Level of care concerns and ADL IADL limitations   Clinical Goal(s)  Over the next 90 days, patient will work with care management team member to address concerns related to resources for in home support  Interventions provided by LCSW:  Assessed patient's care coordination needs related to in home care needs and discussed ongoing care management follow up  Confirmed that patient no longer has her in home aid provider Confirmed that patient's spouse will now need assistance with providing care for patient due to current aids departure Initiated discussion of  alternative in home care options, however patient requested that this social worker speak with his daughter Phone call to patient's daughter, provided patient with resources for private duty care as well personal in home care program provided with patient's insurance company Active listening utilized  Emotional Support Provided Caregiver stress acknowledged     Patient Self Care Activities & Deficits:  Patient is unable to independently navigate community resource options without care coordination support  Acknowledges deficits and is motivated to resolve concern  Unable to perform ADLs independently Unable to perform IADLs independently Attends all scheduled provider appointments  Initial goal documentation        Follow Up Plan: SW will follow up with patient by phone over the next 14 business days      [employer], [place] Worker  [facility] Care Management [phone]

## 2021-02-05 DIAGNOSIS — E785 Hyperlipidemia, unspecified: Secondary | ICD-10-CM

## 2021-02-05 DIAGNOSIS — N1831 Chronic kidney disease, stage 3a: Secondary | ICD-10-CM

## 2021-02-05 DIAGNOSIS — E1165 Type 2 diabetes mellitus with hyperglycemia: Secondary | ICD-10-CM

## 2021-02-05 DIAGNOSIS — Z794 Long term (current) use of insulin: Secondary | ICD-10-CM

## 2021-02-05 DIAGNOSIS — E1159 Type 2 diabetes mellitus with other circulatory complications: Secondary | ICD-10-CM

## 2021-02-05 DIAGNOSIS — I152 Hypertension secondary to endocrine disorders: Secondary | ICD-10-CM

## 2021-02-05 DIAGNOSIS — E1122 Type 2 diabetes mellitus with diabetic chronic kidney disease: Secondary | ICD-10-CM

## 2021-02-11 ENCOUNTER — Ambulatory Visit: Payer: Self-pay | Admitting: *Deleted

## 2021-02-11 ENCOUNTER — Telehealth: Payer: Medicare HMO

## 2021-02-11 ENCOUNTER — Ambulatory Visit (INDEPENDENT_AMBULATORY_CARE_PROVIDER_SITE_OTHER): Payer: Medicare HMO | Admitting: *Deleted

## 2021-02-11 DIAGNOSIS — G3184 Mild cognitive impairment, so stated: Secondary | ICD-10-CM

## 2021-02-11 DIAGNOSIS — I10 Essential (primary) hypertension: Secondary | ICD-10-CM

## 2021-02-11 DIAGNOSIS — R809 Proteinuria, unspecified: Secondary | ICD-10-CM

## 2021-02-11 DIAGNOSIS — I152 Hypertension secondary to endocrine disorders: Secondary | ICD-10-CM

## 2021-02-11 DIAGNOSIS — E1159 Type 2 diabetes mellitus with other circulatory complications: Secondary | ICD-10-CM

## 2021-02-11 DIAGNOSIS — E1065 Type 1 diabetes mellitus with hyperglycemia: Secondary | ICD-10-CM

## 2021-02-11 NOTE — Chronic Care Management (AMB) (Signed)
Chronic Care Management    Clinical Social Work Note  02/11/2021 Name: [name] MRN: [id number] DOB: 11-24-1940  [name] is a 80 y.o. year old female who is a primary care patient of Steele Sizer, MD. The CCM team was consulted to assist the patient with chronic disease management and/or care coordination needs related to: [employer] .   Engaged with patient;s daughter Arrie Aran by telephone for follow up visit in response to provider referral for social work chronic care management and care coordination services.   Consent to Services:  The patient was given information about Chronic Care Management services, agreed to services, and gave verbal consent prior to initiation of services.  Please see initial visit note for detailed documentation.   Patient agreed to services and consent obtained.   Assessment: Review of patient past medical history, allergies, medications, and health status, including review of relevant consultants reports was performed today as part of a comprehensive evaluation and provision of chronic care management and care coordination services.     SDOH (Social Determinants of Health) assessments and interventions performed:    Advanced Directives Status: Not addressed in this encounter.  CCM Care Plan  Allergies  Allergen Reactions   Colesevelam Hcl     scotomas   Daucus Carota    Penicillins Itching    Outpatient Encounter Medications as of 02/11/2021  Medication Sig Note   Accu-Chek Softclix Lancets lancets     alendronate (FOSAMAX) 70 MG tablet TAKE 1 TABLET BY MOUTH EVERY 7 DAYS. TAKE WITH A FULL GLASS OF WATER ON AN EMPTY STOMACH.    amLODipine (NORVASC) 5 MG tablet Take 1 tablet (5 mg total) by mouth daily.    aspirin EC 81 MG tablet Take 81 mg by mouth daily.    atorvastatin (LIPITOR) 80 MG tablet Take 1 tablet (80 mg total) by mouth daily.    blood glucose meter kit and supplies     Calcium Carbonate-Vitamin D (CALCIUM-VITAMIN D)  600-125 MG-UNIT TABS Take 1 tablet by mouth daily.    [employer] ALCOHOL SWABS PADS USE 4 TIMES DAILY TO WIPE SKIN BEFORE USING INSULIN    donepezil (ARICEPT ODT) 10 MG disintegrating tablet Take 1 tablet (10 mg total) by mouth at bedtime.    Glucagon (BAQSIMI ONE PACK) 3 MG/DOSE POWD One spray into nose in case of severe hypoglycemia    hydrALAZINE (APRESOLINE) 10 MG tablet TAKE 1 TABLET (10 MG TOTAL) BY MOUTH 3 (THREE) TIMES DAILY AS NEEDED. BP ABOVE 150/90    insulin aspart (NOVOLOG) 100 UNIT/ML FlexPen 8 units of novolog with every meal. Add sliding scale as necessary.  Max daily dose 50 units.    insulin degludec (TRESIBA FLEXTOUCH) 100 UNIT/ML FlexTouch Pen Inject 24 Units into the skin daily at 12 noon. 01/16/2021: [name] decreased to 22 units   losartan-hydrochlorothiazide (HYZAAR) 100-12.5 MG tablet Take 1 tablet by mouth daily.    No facility-administered encounter medications on file as of 02/11/2021.    Patient Active Problem List   Diagnosis Date Noted   Type 1 diabetes mellitus with hyperglycemia, with long-term current use of insulin ([place]) 04/28/2018   Atherosclerosis of aorta ([place]) 04/01/2016   Aortic sclerosis 09/19/2014   Benign hypertension 09/19/2014   Calculus of gallbladder 09/19/2014   Carpal tunnel syndrome 09/19/2014   Detached retina 09/19/2014   Type 1 diabetes mellitus with microalbuminuria ([place]) 09/19/2014   Dyslipidemia 09/19/2014   Hypophosphatemia 09/19/2014   Mild cognitive impairment with memory loss 09/19/2014   Microalbuminuria  09/19/2014   OP (osteoporosis) 09/19/2014   Menopause 09/19/2014   Ptosis of eyelid 09/19/2014   Hypoglycemia associated with diabetes ([place]) 10/24/2013   Urge incontinence 08/01/2013   Hyperlipidemia due to type 1 diabetes mellitus ([place]) 08/01/2013   Vitamin D deficiency 02/24/2007    Conditions to be addressed/monitored: HTN and DMII; ADL IADL limitations and Memory Deficits  Care Plan : General Social Work (Adult)  Updates  made by [employer], Darla Lesches, LCSW since 02/11/2021 12:00 AM     Problem: CHL AMB "PATIENT-SPECIFIC PROBLEM"   Note:   CARE PLAN ENTRY (see longitudinal plan of care for additional care plan information)  Current Barriers:  Patient with CAD, HTN, DM, and memory deficits  in need of assistance with connection to community resources  Knowledge deficits and need for support, education and care coordination related to community resources support  Level of care concerns and ADL IADL limitations   Clinical Goal(s)  Over the next 90 days, patient will work with care management team member to address concerns related to resources for in home support  Interventions provided by LCSW:  Assessed patient's care coordination needs related to in home care needs and discussed ongoing care management follow up  Confirmed with patient's daughter that she did reach out to in home care agencies provided, however  decided to continue with the previous agency used([facility])-aid started today-10-2 pm Tuesday-Friday Phone call to patient's insurance company to identify any additional services pending-daughter has contact information to call Patient's daughter verbalized having no additional community resource needs at this time Patient's daughter provided with this social worker's contact information in the event that additional community resources are needed.   Patient Self Care Activities & Deficits:  Patient is unable to independently navigate community resource options without care coordination support  Acknowledges deficits and is motivated to resolve concern  Unable to perform ADLs independently Unable to perform IADLs independently Attends all scheduled provider appointments  Please see past updates related to this goal by clicking on the "Past Updates" button in the selected goal         Follow Up Plan:  Client's family enocuraged to reach out to this Education officer, museum with any additional community  resource needs      [employer], [place] Worker  [facility] Care Management [phone]

## 2021-02-11 NOTE — Progress Notes (Signed)
This encounter was created in error - please disregard.

## 2021-02-11 NOTE — Addendum Note (Signed)
Addended by: Vern Claude on: 02/11/2021 11:57 AM   Modules accepted: Orders, Level of Service

## 2021-02-11 NOTE — Chronic Care Management (AMB) (Deleted)
Chronic Care Management    Clinical Social Work Note  02/11/2021 Name: [name] MRN: [id number] DOB: Jan 16, 1941  [name] is a 80 y.o. year old female who is a primary care patient of Nichole Sizer, MD. The CCM team was consulted to assist the patient with chronic disease management and/or care coordination needs related to: [employer] .   Collaboration with patient's daughter  for follow up visit in response to provider referral for social work chronic care management and care coordination services.   Consent to Services:  The patient was given information about Chronic Care Management services, agreed to services, and gave verbal consent prior to initiation of services.  Please see initial visit note for detailed documentation.   Patient agreed to services and consent obtained.   Assessment: Review of patient past medical history, allergies, medications, and health status, including review of relevant consultants reports was performed today as part of a comprehensive evaluation and provision of chronic care management and care coordination services.     SDOH (Social Determinants of Health) assessments and interventions performed:    Advanced Directives Status: Not addressed in this encounter.  CCM Care Plan  Allergies  Allergen Reactions   Colesevelam Hcl     scotomas   Daucus Carota    Penicillins Itching    Outpatient Encounter Medications as of 02/11/2021  Medication Sig Note   Accu-Chek Softclix Lancets lancets     alendronate (FOSAMAX) 70 MG tablet TAKE 1 TABLET BY MOUTH EVERY 7 DAYS. TAKE WITH A FULL GLASS OF WATER ON AN EMPTY STOMACH.    amLODipine (NORVASC) 5 MG tablet Take 1 tablet (5 mg total) by mouth daily.    aspirin EC 81 MG tablet Take 81 mg by mouth daily.    atorvastatin (LIPITOR) 80 MG tablet Take 1 tablet (80 mg total) by mouth daily.    blood glucose meter kit and supplies     Calcium Carbonate-Vitamin D (CALCIUM-VITAMIN D) 600-125  MG-UNIT TABS Take 1 tablet by mouth daily.    [employer]S ALCOHOL SWABS PADS USE 4 TIMES DAILY TO WIPE SKIN BEFORE USING INSULIN    donepezil (ARICEPT ODT) 10 MG disintegrating tablet Take 1 tablet (10 mg total) by mouth at bedtime.    Glucagon (BAQSIMI ONE PACK) 3 MG/DOSE POWD One spray into nose in case of severe hypoglycemia    hydrALAZINE (APRESOLINE) 10 MG tablet TAKE 1 TABLET (10 MG TOTAL) BY MOUTH 3 (THREE) TIMES DAILY AS NEEDED. BP ABOVE 150/90    insulin aspart (NOVOLOG) 100 UNIT/ML FlexPen 8 units of novolog with every meal. Add sliding scale as necessary.  Max daily dose 50 units.    insulin degludec (TRESIBA FLEXTOUCH) 100 UNIT/ML FlexTouch Pen Inject 24 Units into the skin daily at 12 noon. 01/16/2021: [name] decreased to 22 units   losartan-hydrochlorothiazide (HYZAAR) 100-12.5 MG tablet Take 1 tablet by mouth daily.    No facility-administered encounter medications on file as of 02/11/2021.    Patient Active Problem List   Diagnosis Date Noted   Type 1 diabetes mellitus with hyperglycemia, with long-term current use of insulin ([place]) 04/28/2018   Atherosclerosis of aorta ([place]) 04/01/2016   Aortic sclerosis 09/19/2014   Benign hypertension 09/19/2014   Calculus of gallbladder 09/19/2014   Carpal tunnel syndrome 09/19/2014   Detached retina 09/19/2014   Type 1 diabetes mellitus with microalbuminuria ([place]) 09/19/2014   Dyslipidemia 09/19/2014   Hypophosphatemia 09/19/2014   Mild cognitive impairment with memory loss 09/19/2014   Microalbuminuria 09/19/2014  OP (osteoporosis) 09/19/2014   Menopause 09/19/2014   Ptosis of eyelid 09/19/2014   Hypoglycemia associated with diabetes ([place]) 10/24/2013   Urge incontinence 08/01/2013   Hyperlipidemia due to type 1 diabetes mellitus ([place]) 08/01/2013   Vitamin D deficiency 02/24/2007    Conditions to be addressed/monitored:  HTN and DMII; ADL IADL limitations and Memory Deficits Care Plan : General Social Work (Adult)  Updates made by  [employer], Nichole Lesches, LCSW since 02/11/2021 12:00 AM     Problem: CHL AMB "PATIENT-SPECIFIC PROBLEM"   Note:   CARE PLAN ENTRY (see longitudinal plan of care for additional care plan information)  Current Barriers:  Patient with CAD, HTN, DM, and memory deficits  in need of assistance with connection to community resources  Knowledge deficits and need for support, education and care coordination related to community resources support  Level of care concerns and ADL IADL limitations   Clinical Goal(s)  Over the next 90 days, patient will work with care management team member to address concerns related to resources for in home support  Interventions provided by LCSW:  Assessed patient's care coordination needs related to in home care needs and discussed ongoing care management follow up  Confirmed with patient's daughter that she did reach out to in home care agencies provided, however  decided to continue with the previous agency used([facility])-aid started today-10-2 pm Tuesday-Friday Phone call to patient's insurance company to identify any additional services pending-daughter has contact information to call Patient's daughter verbalized having no additional community resource needs at this time Patient's daughter provided with this social worker's contact information in the event that additional community resources are needed.   Patient Self Care Activities & Deficits:  Patient is unable to independently navigate community resource options without care coordination support  Acknowledges deficits and is motivated to resolve concern  Unable to perform ADLs independently Unable to perform IADLs independently Attends all scheduled provider appointments  Initial goal documentation        Follow Up Plan:  Client/Client's family will contact this social worker with any additional community resource needs      [name], [place] Worker  [facility]  Care Management [phone]

## 2021-02-11 NOTE — Patient Instructions (Signed)
Visit Information  Thank you for taking time to visit with me today. Please don't hesitate to contact me if I can be of assistance to you before our next scheduled telephone appointment.  Following are the goals we discussed today:  - continued follow-up with in home care provider -contact insurance company for any additional caregiver options - think ahead to make sure my need does not become an emergency - have a back-up plan     If you are experiencing a Mental Health or [place] or need someone to talk to, please call the Suicide and Crisis Lifeline: 988   Following is a copy of your full plan of care:  Care Plan : General Social Work (Adult)  Updates made by [employer], Darla Lesches, LCSW since 02/11/2021 12:00 AM     Problem: CHL AMB "PATIENT-SPECIFIC PROBLEM"   Note:   CARE PLAN ENTRY (see longitudinal plan of care for additional care plan information)  Current Barriers:  Patient with CAD, HTN, DM, and memory deficits  in need of assistance with connection to community resources  Knowledge deficits and need for support, education and care coordination related to community resources support  Level of care concerns and ADL IADL limitations   Clinical Goal(s)  Over the next 90 days, patient will work with care management team member to address concerns related to resources for in home support  Interventions provided by LCSW:  Assessed patient's care coordination needs related to in home care needs and discussed ongoing care management follow up  Confirmed with patient's daughter that she did reach out to in home care agencies provided, however  decided to continue with the previous agency used([facility])-aid started today-10-2 pm Tuesday-Friday Phone call to patient's insurance company to identify any additional services pending-daughter has contact information to call Patient's daughter verbalized having no additional community resource needs at this time Patient's  daughter provided with this social worker's contact information in the event that additional community resources are needed.   Patient Self Care Activities & Deficits:  Patient is unable to independently navigate community resource options without care coordination support  Acknowledges deficits and is motivated to resolve concern  Unable to perform ADLs independently Unable to perform IADLs independently Attends all scheduled provider appointments  Initial goal documentation       Ms. Ledlow was given information about Care Management services by the embedded care coordination team including:  Care Management services include personalized support from designated clinical staff supervised by her physician, including individualized plan of care and coordination with other care providers 24/7 contact phone numbers for assistance for urgent and routine care needs. The patient may stop CCM services at any time (effective at the end of the month) by phone call to the office staff.  Patient agreed to services and verbal consent obtained.   The patient verbalized understanding of instructions, educational materials, and care plan provided today and declined offer to receive copy of patient instructions, educational materials, and care plan.   No further follow up required: patient's family to contact this Education officer, museum with any additional community resource needs  [employer], [facility] [phone]

## 2021-02-11 NOTE — Patient Instructions (Signed)
Visit Information  Thank you for taking time to visit with me today. Please don't hesitate to contact me if I can be of assistance to you before our next scheduled telephone appointment.  Following are the goals we discussed today:  continued follow-up with in home care provider -contact insurance company for any additional caregiver options - think ahead to make sure my need does not become an emergency - have a back-up plan   If you are experiencing a Mental Health or [place] or need someone to talk to, please call the Suicide and Crisis Lifeline: 988   The patient verbalized understanding of instructions, educational materials, and care plan provided today and declined offer to receive copy of patient instructions, educational materials, and care plan.   No further follow up required: patient's family provided with this social worker's contact number for any additional community resource needs that may arise in the future  [employer], [place] Worker  [facility] Care Management [phone]

## 2021-02-13 DIAGNOSIS — E113593 Type 2 diabetes mellitus with proliferative diabetic retinopathy without macular edema, bilateral: Secondary | ICD-10-CM | POA: Diagnosis not present

## 2021-02-13 LAB — HM DIABETES EYE EXAM

## 2021-03-06 ENCOUNTER — Ambulatory Visit: Payer: Medicare HMO

## 2021-03-06 DIAGNOSIS — E1159 Type 2 diabetes mellitus with other circulatory complications: Secondary | ICD-10-CM

## 2021-03-06 DIAGNOSIS — I152 Hypertension secondary to endocrine disorders: Secondary | ICD-10-CM

## 2021-03-06 DIAGNOSIS — G3184 Mild cognitive impairment, so stated: Secondary | ICD-10-CM

## 2021-03-06 DIAGNOSIS — E1065 Type 1 diabetes mellitus with hyperglycemia: Secondary | ICD-10-CM

## 2021-03-06 NOTE — Chronic Care Management (AMB) (Signed)
Chronic Care Management   CCM RN Visit Note  03/06/2021 Name: [name] MRN: [id number] DOB: 08/24/1940  Subjective: [name] is a 80 y.o. year old female who is a primary care patient of Steele Sizer, MD. The care management team was consulted for assistance with disease management and care coordination needs.    Engaged with patient's spouse by telephone for follow up visit in response to provider referral for case management and care coordination services.   Consent to Services:  The patient was given information about Chronic Care Management services, agreed to services, and gave verbal consent prior to initiation of services.  Please see initial visit note for detailed documentation.    Assessment: Review of patient past medical history, allergies, medications, health status, including review of consultants reports, laboratory and other test data, was performed as part of comprehensive evaluation and provision of chronic care management services.   SDOH (Social Determinants of Health) assessments and interventions performed: No   CCM Care Plan  Allergies  Allergen Reactions   Colesevelam Hcl     scotomas   Daucus Carota    Penicillins Itching    Outpatient Encounter Medications as of 03/06/2021  Medication Sig Note   Accu-Chek Softclix Lancets lancets     alendronate (FOSAMAX) 70 MG tablet TAKE 1 TABLET BY MOUTH EVERY 7 DAYS. TAKE WITH A FULL GLASS OF WATER ON AN EMPTY STOMACH.    amLODipine (NORVASC) 5 MG tablet Take 1 tablet (5 mg total) by mouth daily.    aspirin EC 81 MG tablet Take 81 mg by mouth daily.    atorvastatin (LIPITOR) 80 MG tablet Take 1 tablet (80 mg total) by mouth daily.    blood glucose meter kit and supplies     Calcium Carbonate-Vitamin D (CALCIUM-VITAMIN D) 600-125 MG-UNIT TABS Take 1 tablet by mouth daily.    [employer] ALCOHOL SWABS PADS USE 4 TIMES DAILY TO WIPE SKIN BEFORE USING INSULIN    donepezil (ARICEPT ODT) 10 MG disintegrating  tablet Take 1 tablet (10 mg total) by mouth at bedtime.    Glucagon (BAQSIMI ONE PACK) 3 MG/DOSE POWD One spray into nose in case of severe hypoglycemia    hydrALAZINE (APRESOLINE) 10 MG tablet TAKE 1 TABLET (10 MG TOTAL) BY MOUTH 3 (THREE) TIMES DAILY AS NEEDED. BP ABOVE 150/90    insulin aspart (NOVOLOG) 100 UNIT/ML FlexPen 8 units of novolog with every meal. Add sliding scale as necessary.  Max daily dose 50 units.    insulin degludec (TRESIBA FLEXTOUCH) 100 UNIT/ML FlexTouch Pen Inject 24 Units into the skin daily at 12 noon. 01/16/2021: [name] decreased to 22 units   losartan-hydrochlorothiazide (HYZAAR) 100-12.5 MG tablet Take 1 tablet by mouth daily.    No facility-administered encounter medications on file as of 03/06/2021.    Patient Active Problem List   Diagnosis Date Noted   Type 1 diabetes mellitus with hyperglycemia, with long-term current use of insulin ([place]) 04/28/2018   Atherosclerosis of aorta ([place]) 04/01/2016   Aortic sclerosis 09/19/2014   Benign hypertension 09/19/2014   Calculus of gallbladder 09/19/2014   Carpal tunnel syndrome 09/19/2014   Detached retina 09/19/2014   Type 1 diabetes mellitus with microalbuminuria ([place]) 09/19/2014   Dyslipidemia 09/19/2014   Hypophosphatemia 09/19/2014   Mild cognitive impairment with memory loss 09/19/2014   Microalbuminuria 09/19/2014   OP (osteoporosis) 09/19/2014   Menopause 09/19/2014   Ptosis of eyelid 09/19/2014   Hypoglycemia associated with diabetes ([place]) 10/24/2013   Urge incontinence 08/01/2013  Hyperlipidemia due to type 1 diabetes mellitus ([place]) 08/01/2013   Vitamin D deficiency 02/24/2007   Patient Care Plan: RN Care Management Plan of Care     Problem Identified: HTN, HLD, DM, Dementia and Fall Risk      Long-Range Goal: Disease Progression Prevented or Minimized   Start Date: 01/16/2021  Expected End Date: 04/16/2021  Priority: High  Note:   Current Barriers:  Chronic Disease Management support and  education needs related to HTN, HLD, DMI, Dementia, and Fall Risk.  RNCM Clinical Goal(s):  Patient's caregivers will assist with adherence to prescribed treatment plan for HTN, HLD, DMI, Dementia, and Fall Risk through collaboration with the provider RN Care Manager and the care management team.  Interventions: 1:1 collaboration with primary care provider regarding development and update of comprehensive plan of care as evidenced by provider attestation and co-signature Inter-disciplinary care team collaboration (see longitudinal plan of care) Evaluation of current treatment plan related to  self management and patient's adherence to plan as established by provider   Diabetes Interventions:  (Status:  Goal on track:  Yes.) Long Term Goal A1c goal: <7%  Lab Results  Component Value Date   HGBA1C 8.7 (H) 12/30/2020   Reviewed plan for diabetes management with spouse. Reports she is taking medications as prescribed. Spouse continues to assist with medication management and blood glucose monitoring. He remains very diligent with recording readings and taking appropriate interventions. Reviewed readings over the past week. Spouse reports three low morning readings over the past week. Readings ranged from 45 to 64 mg/dl. Reports patient was alert and responding appropriately. Reports providing juice and levels returned to normal range. Encouraged to continue monitoring readings and maintaining a log. He continues to provide appropriate interventions for readings outside of the established range. Reviewed nutritional intake. Spouse reports patient has a very good appetite. The family is assisting with providing ADA/modified carb meals as recommended.   Falls Interventions:  (Status:  Goal on track:  Yes.) Long Term Goal Reviewed safety and fall prevention measures. Per spouse, patient continues to use her rollator walker when ambulating. Denies recent falls. Reviewed functional status. Spouse  reports her functional status has remained unchanged since our last outreach. Patient continues to receive good support from family. Spouse declines current need for additional assistance in the home. Will keep the care management team updated of changes.    Hyperlipidemia Interventions:  (Status:  Condition stable.  Not addressed this visit.) Long Term Goal Lab Results  Component Value Date   CHOL 132 12/30/2020   HDL 35 (L) 12/30/2020   LDLCALC 83 12/30/2020   TRIG 63 12/30/2020   CHOLHDL 3.8 12/30/2020    Medications Reviewed. Spouse and caregiver advised to continue monitoring as prescribed. Advised to notify provider if Mrs. Sahagian is unable to tolerate the prescribed regimen. Reviewed established cholesterol goals  Counseled on importance of regular laboratory monitoring as prescribed Reviewed importance of limiting foods high in cholesterol  Hypertension Interventions:  Reviewed established blood pressure parameters. Reviewed recent home BP readings. Spouse reports one elevated reading over the past week. Reading was 177/90. Reports administering additional hydralazine as prescribed. Reports BP decreased to 141/79 after hydralazine. Advised to continue monitoring and recording readings.  Reviewed compliance with recommended cardiac prudent diet. Family continues to prepare meals. Encouraged to continue reading nutrition labels and avoid serving highly processed foods when possible. Reviewed complications of uncontrolled blood pressure.  Reviewed s/sx of heart attack, stroke and worsening symptoms that require immediate medical attention.  Patient Goals (Caregivers will Assist): Take all medications as prescribed Attend all scheduled provider appointments Call pharmacy for medication refills 3-7 days in advance of running out of medications Call provider office for new concerns or questions    Follow Up Plan:   Will follow up next month      PLAN: A member of the care  management team will follow up in two months.   [facility] Care Management [facility] [phone]

## 2021-03-06 NOTE — Patient Instructions (Signed)
Thank you for allowing the Chronic Care Management team to participate in your care. It was great speaking with you today! °

## 2021-03-08 DIAGNOSIS — E1159 Type 2 diabetes mellitus with other circulatory complications: Secondary | ICD-10-CM | POA: Diagnosis not present

## 2021-03-08 DIAGNOSIS — E1029 Type 1 diabetes mellitus with other diabetic kidney complication: Secondary | ICD-10-CM

## 2021-03-08 DIAGNOSIS — I10 Essential (primary) hypertension: Secondary | ICD-10-CM | POA: Diagnosis not present

## 2021-03-08 DIAGNOSIS — E1065 Type 1 diabetes mellitus with hyperglycemia: Secondary | ICD-10-CM | POA: Diagnosis not present

## 2021-03-08 DIAGNOSIS — R809 Proteinuria, unspecified: Secondary | ICD-10-CM | POA: Diagnosis not present

## 2021-03-08 DIAGNOSIS — I152 Hypertension secondary to endocrine disorders: Secondary | ICD-10-CM | POA: Diagnosis not present

## 2021-04-15 DIAGNOSIS — E1069 Type 1 diabetes mellitus with other specified complication: Secondary | ICD-10-CM | POA: Diagnosis not present

## 2021-04-15 DIAGNOSIS — E1065 Type 1 diabetes mellitus with hyperglycemia: Secondary | ICD-10-CM | POA: Diagnosis not present

## 2021-04-15 DIAGNOSIS — I152 Hypertension secondary to endocrine disorders: Secondary | ICD-10-CM | POA: Diagnosis not present

## 2021-04-15 DIAGNOSIS — E785 Hyperlipidemia, unspecified: Secondary | ICD-10-CM | POA: Diagnosis not present

## 2021-04-15 DIAGNOSIS — M810 Age-related osteoporosis without current pathological fracture: Secondary | ICD-10-CM | POA: Diagnosis not present

## 2021-04-15 DIAGNOSIS — E1159 Type 2 diabetes mellitus with other circulatory complications: Secondary | ICD-10-CM | POA: Diagnosis not present

## 2021-04-15 LAB — HEMOGLOBIN A1C: Hemoglobin A1C: 7.5

## 2021-05-07 ENCOUNTER — Ambulatory Visit (INDEPENDENT_AMBULATORY_CARE_PROVIDER_SITE_OTHER): Payer: Medicare HMO

## 2021-05-07 DIAGNOSIS — E1065 Type 1 diabetes mellitus with hyperglycemia: Secondary | ICD-10-CM

## 2021-05-07 DIAGNOSIS — Z9181 History of falling: Secondary | ICD-10-CM

## 2021-05-07 DIAGNOSIS — I152 Hypertension secondary to endocrine disorders: Secondary | ICD-10-CM

## 2021-05-07 NOTE — Patient Instructions (Addendum)
Thank you for allowing the Chronic Care Management team to participate in your care. It was great speaking with you today! ? ?Our next appointment is on July 02, 2021 at 4 pm. ?Please call the care guide team at [phone] if you need to cancel or reschedule your appointment.  ?  ? ?Please don't hesitate to contact me if Mrs. Bordelon requires assistance before our next scheduled telephone appointment. ? ?Shawnita Krizek,RN ?[place]/[facility] Care Management ?[facility] ?[phone] ? ?

## 2021-05-07 NOTE — Chronic Care Management (AMB) (Incomplete)
Chronic Care Management   CCM RN Visit Note  05/07/2021 Name: [name] MRN: [id number] DOB: April 05, 1940  Subjective: [name] is a 81 y.o. year old female who is a primary care patient of Steele Sizer, MD. The care management team was consulted for assistance with disease management and care coordination needs.    Engaged with patient's spouse by telephone for follow up visit in response to provider referral for case management and care coordination services.   Consent to Services:  The patient was given information about Chronic Care Management services, agreed to services, and gave verbal consent prior to initiation of services.  Please see initial visit note for detailed documentation.   Assessment: Review of patient past medical history, allergies, medications, health status, including review of consultants reports, laboratory and other test data, was performed as part of comprehensive evaluation and provision of chronic care management services.   SDOH (Social Determinants of Health) assessments and interventions performed: No  CCM Care Plan  Allergies  Allergen Reactions   Colesevelam Hcl     scotomas   Daucus Carota    Penicillins Itching    Outpatient Encounter Medications as of 05/07/2021  Medication Sig Note   Accu-Chek Softclix Lancets lancets     alendronate (FOSAMAX) 70 MG tablet TAKE 1 TABLET BY MOUTH EVERY 7 DAYS. TAKE WITH A FULL GLASS OF WATER ON AN EMPTY STOMACH.    amLODipine (NORVASC) 5 MG tablet Take 1 tablet (5 mg total) by mouth daily.    aspirin EC 81 MG tablet Take 81 mg by mouth daily.    atorvastatin (LIPITOR) 80 MG tablet Take 1 tablet (80 mg total) by mouth daily.    blood glucose meter kit and supplies     Calcium Carbonate-Vitamin D (CALCIUM-VITAMIN D) 600-125 MG-UNIT TABS Take 1 tablet by mouth daily.    [employer]S ALCOHOL SWABS PADS USE 4 TIMES DAILY TO WIPE SKIN BEFORE USING INSULIN    donepezil (ARICEPT ODT) 10 MG disintegrating tablet Take  1 tablet (10 mg total) by mouth at bedtime.    Glucagon (BAQSIMI ONE PACK) 3 MG/DOSE POWD One spray into nose in case of severe hypoglycemia    hydrALAZINE (APRESOLINE) 10 MG tablet TAKE 1 TABLET (10 MG TOTAL) BY MOUTH 3 (THREE) TIMES DAILY AS NEEDED. BP ABOVE 150/90    insulin aspart (NOVOLOG) 100 UNIT/ML FlexPen 8 units of novolog with every meal. Add sliding scale as necessary.  Max daily dose 50 units.    insulin degludec (TRESIBA FLEXTOUCH) 100 UNIT/ML FlexTouch Pen Inject 24 Units into the skin daily at 12 noon. 01/16/2021: [name] decreased to 22 units   losartan-hydrochlorothiazide (HYZAAR) 100-12.5 MG tablet Take 1 tablet by mouth daily.    No facility-administered encounter medications on file as of 05/07/2021.    Patient Active Problem List   Diagnosis Date Noted   Type 1 diabetes mellitus with hyperglycemia, with long-term current use of insulin ([place]) 04/28/2018   Atherosclerosis of aorta ([place]) 04/01/2016   Aortic sclerosis 09/19/2014   Benign hypertension 09/19/2014   Calculus of gallbladder 09/19/2014   Carpal tunnel syndrome 09/19/2014   Detached retina 09/19/2014   Type 1 diabetes mellitus with microalbuminuria ([place]) 09/19/2014   Dyslipidemia 09/19/2014   Hypophosphatemia 09/19/2014   Mild cognitive impairment with memory loss 09/19/2014   Microalbuminuria 09/19/2014   OP (osteoporosis) 09/19/2014   Menopause 09/19/2014   Ptosis of eyelid 09/19/2014   Hypoglycemia associated with diabetes ([place]) 10/24/2013   Urge incontinence 08/01/2013   Hyperlipidemia due  to type 1 diabetes mellitus ([place]) 08/01/2013   Vitamin D deficiency 02/24/2007      PLAN A member of the care management team will follow up next month.   [facility] Care Management [facility] [phone]

## 2021-06-06 DIAGNOSIS — E1159 Type 2 diabetes mellitus with other circulatory complications: Secondary | ICD-10-CM

## 2021-06-06 DIAGNOSIS — I152 Hypertension secondary to endocrine disorders: Secondary | ICD-10-CM

## 2021-06-06 DIAGNOSIS — E1065 Type 1 diabetes mellitus with hyperglycemia: Secondary | ICD-10-CM | POA: Diagnosis not present

## 2021-06-13 ENCOUNTER — Other Ambulatory Visit: Payer: Self-pay | Admitting: Family Medicine

## 2021-06-13 DIAGNOSIS — M810 Age-related osteoporosis without current pathological fracture: Secondary | ICD-10-CM

## 2021-06-27 ENCOUNTER — Other Ambulatory Visit: Payer: Self-pay | Admitting: Family Medicine

## 2021-06-27 DIAGNOSIS — E785 Hyperlipidemia, unspecified: Secondary | ICD-10-CM

## 2021-06-27 DIAGNOSIS — I700 Atherosclerosis of aorta: Secondary | ICD-10-CM

## 2021-06-27 NOTE — Progress Notes (Deleted)
Name: [name]   MRN: [id number]    DOB: [birth year]   Date:06/27/2021       Progress Note  Subjective  Chief Complaint  Follow Up  HPI  Gait instability: she had two rounds PT at home this year, she needs assistance to walk but no falls. Daughter thinks she is afraid to fall  CKI stage III: we will recheck urine micro and CMP with GFR today, denies pruritis, she wears depends for incontinence but has good volume.   Diabetes type I:  She is seeing Dr. Honor Junes, last A1C was 9.7 % was April 2022, she had a change in medication, down to [place] 24 units daily, and pre meal insulin. Fasting today was down to 49, but usually between 70's-80's, advised to go down on T[place]iba to 22 units and monitor.  She has associated dyslipidemia, CKI and HTN. Taking ARB, statin therapy and reminded yearly eye exam  HTN: bp goes up and down, taking medications , no chest pain , dizziness or palpitation  Atherosclerosis of aorta: taking high dose Atorvastatin , last LDL was 88, goal is below 70 , we will recheck it today and consider switching to Crestor 40 or adding zetia.   Gallbladder sone: husband said she vomited once on Saturday, no fever or change in behavior, advised to monitor for now. Husband said she ate baked chicken and vegetables.   Cognitive Dysfunction: seen by neurologist years ago, at the time Dr. Manuella Ghazi felt secondary to metabolic dysfunction due to uncontrolled DM, family states worse symptoms is short term memory loss. Cooperative, sometimes she gets agitated at night. No wondering. Discussed referral back to neurologist, we can also try medication and she chose the later   Patient Active Problem List   Diagnosis Date Noted   Type 1 diabetes mellitus with hyperglycemia, with long-term current use of insulin ([place]) 04/28/2018   Atherosclerosis of aorta ([place]) 04/01/2016   Aortic sclerosis 09/19/2014   Benign hypertension 09/19/2014   Calculus of gallbladder 09/19/2014   Carpal tunnel  syndrome 09/19/2014   Detached retina 09/19/2014   Type 1 diabetes mellitus with microalbuminuria ([place]) 09/19/2014   Dyslipidemia 09/19/2014   Hypophosphatemia 09/19/2014   Mild cognitive impairment with memory loss 09/19/2014   Microalbuminuria 09/19/2014   OP (osteoporosis) 09/19/2014   Menopause 09/19/2014   Ptosis of eyelid 09/19/2014   Hypoglycemia associated with diabetes ([place]) 10/24/2013   Urge incontinence 08/01/2013   Hyperlipidemia due to type 1 diabetes mellitus ([place]) 08/01/2013   Vitamin D deficiency 02/24/2007    Past Surgical History:  Procedure Laterality Date   BREAST EXCISIONAL BIOPSY Left    neg   BREAST SURGERY  1970   EYE SURGERY      Family History  Problem Relation Age of Onset   Alzheimer's disease Mother    Diabetes Mother    Arthritis Mother    Diabetes Father    Hypertension Father    Hyperlipidemia Father    Stroke Brother    Multiple sclerosis Daughter     Social History   Tobacco Use   Smoking status: Never   Smokeless tobacco: Never   Tobacco comments:    smoking cessation materials not required  Substance Use Topics   Alcohol use: No    Alcohol/week: 0.0 standard drinks     Current Outpatient Medications:    alendronate (FOSAMAX) 70 MG tablet, TAKE 1 TABLET BY MOUTH EVERY 7 DAYS. TAKE WITH A FULL GLASS OF WATER ON AN EMPTY STOMACH., Disp: 12 tablet,  Rfl: 1   Accu-Chek Softclix Lancets lancets, , Disp: , Rfl:    amLODipine (NORVASC) 5 MG tablet, Take 1 tablet (5 mg total) by mouth daily., Disp: 90 tablet, Rfl: 1   aspirin EC 81 MG tablet, Take 81 mg by mouth daily., Disp: , Rfl:    atorvastatin (LIPITOR) 80 MG tablet, TAKE 1 TABLET BY MOUTH EVERY DAY, Disp: 90 tablet, Rfl: 1   blood glucose meter kit and supplies, , Disp: , Rfl:    Calcium Carbonate-Vitamin D (CALCIUM-VITAMIN D) 600-125 MG-UNIT TABS, Take 1 tablet by mouth daily., Disp: , Rfl:    CVS ALCOHOL SWABS PADS, USE 4 TIMES DAILY TO WIPE SKIN BEFORE USING INSULIN, Disp:  400 each, Rfl: 2   donepezil (ARICEPT ODT) 10 MG disintegrating tablet, Take 1 tablet (10 mg total) by mouth at bedtime., Disp: 90 tablet, Rfl: 1   Glucagon (BAQSIMI ONE PACK) 3 MG/DOSE POWD, One spray into nose in case of severe hypoglycemia, Disp: , Rfl:    hydrALAZINE (APRESOLINE) 10 MG tablet, TAKE 1 TABLET (10 MG TOTAL) BY MOUTH 3 (THREE) TIMES DAILY AS NEEDED. BP ABOVE 150/90, Disp: 270 tablet, Rfl: 0   insulin aspart (NOVOLOG) 100 UNIT/ML FlexPen, 8 units of novolog with every meal. Add sliding scale as necessary.  Max daily dose 50 units., Disp: , Rfl:    insulin degludec (TRESIBA FLEXTOUCH) 100 UNIT/ML FlexTouch Pen, Inject 24 Units into the skin daily at 12 noon., Disp: , Rfl:    losartan-hydrochlorothiazide (HYZAAR) 100-12.5 MG tablet, Take 1 tablet by mouth daily., Disp: 90 tablet, Rfl: 1  Allergies  Allergen Reactions   Colesevelam Hcl     scotomas   Daucus Carota    Penicillins Itching    I personally reviewed active problem list, medication list, allergies, family history, social history, health maintenance with the patient/caregiver today.   ROS  ***  Objective  There were no vitals filed for this visit.  There is no height or weight on file to calculate BMI.  Physical Exam ***  No results found for this or any previous visit (from the past 2160 hour(s)).  Diabetic Foot Exam: Diabetic Foot Exam - Simple   No data filed    ***  PHQ2/9:    01/16/2021    1:47 PM 12/30/2020    1:44 PM 04/09/2020    3:40 PM 09/08/2019    1:44 PM 08/02/2019    2:30 PM  Depression screen PHQ 2/9  Decreased Interest 0 0 0 0 0  Down, Depressed, Hopeless 0 0 0 0 0  PHQ - 2 Score 0 0 0 0 0  Altered sleeping    0 0  Tired, decreased energy    0 0  Change in appetite    0 0  Feeling bad or failure about yourself     0 0  Trouble concentrating    0 0  Moving slowly or fidgety/restless    0 0  Suicidal thoughts    0 0  PHQ-9 Score    0 0    phq 9 is {gen pos  [id number]}   Fall Risk:    01/16/2021    1:06 PM 12/30/2020    1:44 PM 04/09/2020    3:40 PM 09/08/2019    1:43 PM 08/02/2019    2:30 PM  Fall Risk   Falls in the past year? 0 0 0 0 0  Number falls in past yr: 0 0 0 0 0  Injury with Fall?  0 0 0 0  Risk for fall due to : Medication side effect;Impaired balance/gait Impaired balance/gait     Follow up Falls prevention discussed Falls prevention discussed         Functional Status Survey:      Assessment & Plan  *** There are no diagnoses linked to this encounter.

## 2021-06-30 ENCOUNTER — Ambulatory Visit: Payer: Medicare HMO | Admitting: Family Medicine

## 2021-07-02 ENCOUNTER — Telehealth: Payer: Medicare HMO

## 2021-07-03 ENCOUNTER — Other Ambulatory Visit: Payer: Self-pay | Admitting: Family Medicine

## 2021-07-03 DIAGNOSIS — G3184 Mild cognitive impairment, so stated: Secondary | ICD-10-CM

## 2021-07-03 NOTE — Telephone Encounter (Signed)
Appt scheduled for 5.23.2023 with Dr Ancil Boozer

## 2021-07-16 ENCOUNTER — Telehealth: Payer: Medicare HMO

## 2021-07-17 ENCOUNTER — Telehealth: Payer: Medicare HMO

## 2021-07-18 ENCOUNTER — Other Ambulatory Visit: Payer: Self-pay | Admitting: Family Medicine

## 2021-07-18 DIAGNOSIS — G3184 Mild cognitive impairment, so stated: Secondary | ICD-10-CM

## 2021-07-28 NOTE — Progress Notes (Unsigned)
Name: [name]   MRN: [id number]    DOB: 1940/10/02   Date:07/29/2021       Progress Note  Subjective  Chief Complaint  Follow Up  HPI  Gait instability: she had two rounds PT at home this year, she needs assistance to walk but no falls Caregivers would like to resume PT   Malnutrition: she has lost 7 lbs in the past 3 months, and 15 lbs in the past year . She is eating but her grandson has been cooking healthier meals at home with lots of vegetables.   CKI stage III: we will recheck urine micro and CMP with GFR today, denies pruritis, she wears depends for incontinence but has good volume.   Diabetes type I:  She is seeing Dr. Honor Junes, last A1C was 9.7 % was April 2022, she had a change in medication, down to [place] 22 units daily, and pre meal insulin. FSBS has been stable, last A1C was 7.5 %, this am it was 117  She has associated dyslipidemia, CKI and HTN. Taking ARB, statin therapy , she is up to date with eye exam  HTN: bp goes up and down, taking medications , no chest pain , dizziness or palpitation  Atherosclerosis of aorta: taking high dose Atorvastatin , last LDL was 83, goal is below 70 , discussed with family members and they prefer not changing or adding medications   Gallbladder stone: no symptoms, appetite has been good, no pain , nausea or vomiting   Cognitive Dysfunction: seen by neurologist years ago, at the time Dr. Manuella Ghazi felt secondary to metabolic dysfunction due to uncontrolled DM, family states worse symptoms is short term memory loss. Cooperative, sometimes she gets agitated at night. No wondering. She is taking Aricept since Fall 2022, memory is stable.   URI: she had weakness, some tremors , rhinorrhea and dry cough that started 4 days ago, doing better now, last coughing spell was last night. Does not seem to have SOB. Only symptoms now is weakness has improved but not back to baseline yet   Patient Active Problem List   Diagnosis Date Noted   Mild  protein-calorie malnutrition ([place]) 07/29/2021   Stage 3a chronic kidney disease ([place]) 07/29/2021   Gait instability 07/29/2021   Type 1 diabetes mellitus with hyperglycemia, with long-term current use of insulin ([place]) 04/28/2018   Atherosclerosis of aorta ([place]) 04/01/2016   Aortic sclerosis 09/19/2014   Hypertension associated with diabetes ([place]) 09/19/2014   Calculus of gallbladder 09/19/2014   Carpal tunnel syndrome 09/19/2014   Detached retina 09/19/2014   Type 1 diabetes mellitus with microalbuminuria ([place]) 09/19/2014   Dyslipidemia 09/19/2014   Hypophosphatemia 09/19/2014   Mild cognitive impairment with memory loss 09/19/2014   Microalbuminuria 09/19/2014   OP (osteoporosis) 09/19/2014   Menopause 09/19/2014   Ptosis of eyelid 09/19/2014   Hypoglycemia associated with diabetes ([place]) 10/24/2013   Urge incontinence 08/01/2013   Hyperlipidemia due to type 1 diabetes mellitus ([place]) 08/01/2013   Vitamin D deficiency 02/24/2007    Past Surgical History:  Procedure Laterality Date   BREAST EXCISIONAL BIOPSY Left    neg   BREAST SURGERY  1970   EYE SURGERY      Family History  Problem Relation Age of Onset   Alzheimer's disease Mother    Diabetes Mother    Arthritis Mother    Diabetes Father    Hypertension Father    Hyperlipidemia Father    Stroke Brother    Multiple sclerosis Daughter  Social History   Tobacco Use   Smoking status: Never   Smokeless tobacco: Never   Tobacco comments:    smoking cessation materials not required  Substance Use Topics   Alcohol use: No    Alcohol/week: 0.0 standard drinks     Current Outpatient Medications:    Accu-Chek Softclix Lancets lancets, , Disp: , Rfl:    alendronate (FOSAMAX) 70 MG tablet, TAKE 1 TABLET BY MOUTH EVERY 7 DAYS. TAKE WITH A FULL GLASS OF WATER ON AN EMPTY STOMACH., Disp: 12 tablet, Rfl: 1   amLODipine (NORVASC) 5 MG tablet, Take 1 tablet (5 mg total) by mouth daily., Disp: 90 tablet, Rfl: 1   aspirin  EC 81 MG tablet, Take 81 mg by mouth daily., Disp: , Rfl:    atorvastatin (LIPITOR) 80 MG tablet, TAKE 1 TABLET BY MOUTH EVERY DAY, Disp: 90 tablet, Rfl: 1   blood glucose meter kit and supplies, , Disp: , Rfl:    Calcium Carbonate-Vitamin D (CALCIUM-VITAMIN D) 600-125 MG-UNIT TABS, Take 1 tablet by mouth daily., Disp: , Rfl:    [employer] ALCOHOL SWABS PADS, USE 4 TIMES DAILY TO WIPE SKIN BEFORE USING INSULIN, Disp: 400 each, Rfl: 2   donepezil (ARICEPT ODT) 10 MG disintegrating tablet, TAKE 1 TABLET BY MOUTH EVERYDAY AT BEDTIME, Disp: 30 tablet, Rfl: 0   Glucagon (BAQSIMI ONE PACK) 3 MG/DOSE POWD, One spray into nose in case of severe hypoglycemia, Disp: , Rfl:    hydrALAZINE (APRESOLINE) 10 MG tablet, TAKE 1 TABLET (10 MG TOTAL) BY MOUTH 3 (THREE) TIMES DAILY AS NEEDED. BP ABOVE 150/90, Disp: 270 tablet, Rfl: 0   insulin aspart (NOVOLOG) 100 UNIT/ML FlexPen, 8 units of novolog with every meal. Add sliding scale as necessary.  Max daily dose 50 units., Disp: , Rfl:    insulin degludec (TRESIBA FLEXTOUCH) 100 UNIT/ML FlexTouch Pen, Inject 24 Units into the skin daily at 12 noon., Disp: , Rfl:    losartan-hydrochlorothiazide (HYZAAR) 100-12.5 MG tablet, Take 1 tablet by mouth daily., Disp: 90 tablet, Rfl: 1  Allergies  Allergen Reactions   Colesevelam Hcl     scotomas   Daucus Carota    Penicillins Itching    I personally reviewed active problem list, medication list, allergies, family history, social history, health maintenance with the patient/caregiver today.   ROS   Constitutional: Negative for fever, positive for  weight change.  Respiratory: Negative for cough and shortness of breath.   Cardiovascular: Negative for chest pain or palpitations.  Gastrointestinal: Negative for abdominal pain, no bowel changes.  Musculoskeletal: positive  for gait problem but no  joint swelling.  Skin: Negative for rash.  Neurological: Negative for dizziness or headache.  No other specific complaints in a  complete review of systems (except as listed in HPI above).   Objective  Vitals:   07/29/21 1321  BP: 116/72  Pulse: 81  Resp: 16  Temp: 98.8 F (37.1 C)  SpO2: 96%  Weight: 122 lb (55.3 kg)  Height: 5' 3" (1.6 m)    Body mass index is 21.61 kg/m.  Physical Exam  Constitutional: Patient appears petite and some temporal waisting noticed. No distress.  HEENT: head atraumatic, normocephalic, pupils equal and reactive to light, neck supple Cardiovascular: Normal rate, regular rhythm . 3/6  murmur heard. No BLE edema. Pulmonary/Chest: Effort normal and breath sounds normal. No respiratory distress. Abdominal: Soft.  There is no tenderness. Psychiatric: Patient has a normal mood and affect. behavior is normal. Judgment and thought content  normal.   Diabetic Foot Exam: Diabetic Foot Exam - Simple   Simple Foot Form Visual Inspection No deformities, no ulcerations, no other skin breakdown bilaterally: Yes Sensation Testing Intact to touch and monofilament testing bilaterally: Yes Pulse Check Posterior Tibialis and Dorsalis pulse intact bilaterally: Yes Comments      PHQ2/9:    07/29/2021    1:21 PM 01/16/2021    1:47 PM 12/30/2020    1:44 PM 04/09/2020    3:40 PM 09/08/2019    1:44 PM  Depression screen PHQ 2/9  Decreased Interest 0 0 0 0 0  Down, Depressed, Hopeless 0 0 0 0 0  PHQ - 2 Score 0 0 0 0 0  Altered sleeping     0  Tired, decreased energy     0  Change in appetite     0  Feeling bad or failure about yourself      0  Trouble concentrating     0  Moving slowly or fidgety/restless     0  Suicidal thoughts     0  PHQ-9 Score     0    phq 9 is negative   Fall Risk:    07/29/2021    1:21 PM 01/16/2021    1:06 PM 12/30/2020    1:44 PM 04/09/2020    3:40 PM 09/08/2019    1:43 PM  Fall Risk   Falls in the past year? 0 0 0 0 0  Number falls in past yr: 0 0 0 0 0  Injury with Fall? 0  0 0 0  Risk for fall due to : Impaired balance/gait Medication side  effect;Impaired balance/gait Impaired balance/gait    Follow up Falls prevention discussed Falls prevention discussed Falls prevention discussed        Functional Status Survey: Is the patient deaf or have difficulty hearing?: No Does the patient have difficulty seeing, even when wearing glasses/contacts?: No Does the patient have difficulty concentrating, remembering, or making decisions?: Yes Does the patient have difficulty walking or climbing stairs?: Yes Does the patient have difficulty dressing or bathing?: Yes Does the patient have difficulty doing errands alone such as visiting a doctor's office or shopping?: Yes    Assessment & Plan  Problem List Items Addressed This Visit     Hypertension associated with diabetes ([place]) - Primary    BP oscillates, discussed importance of staying hydrated        Relevant Medications   amLODipine (NORVASC) 5 MG tablet   losartan-hydrochlorothiazide (HYZAAR) 100-12.5 MG tablet   Type 1 diabetes mellitus with microalbuminuria ([place])    Under the care of Dr. Honor Junes  Compliant and A1C has improved       Relevant Medications   losartan-hydrochlorothiazide (HYZAAR) 100-12.5 MG tablet   Dyslipidemia    On statin therapy        Atherosclerosis of aorta ([facility])    Continue statin therapy        Relevant Medications   amLODipine (NORVASC) 5 MG tablet   losartan-hydrochlorothiazide (HYZAAR) 100-12.5 MG tablet   Mild protein-calorie malnutrition ([facility])   Stage 3a chronic kidney disease ([place])    Continue ARB       Relevant Medications   losartan-hydrochlorothiazide (HYZAAR) 100-12.5 MG tablet   Gait instability    Resume PT       Relevant Orders   Ambulatory referral to [facility]   OP (osteoporosis)   Mild cognitive impairment with memory loss   Relevant Medications  donepezil (ARICEPT ODT) 10 MG disintegrating tablet   Vitamin D deficiency

## 2021-07-29 ENCOUNTER — Encounter: Payer: Self-pay | Admitting: Family Medicine

## 2021-07-29 ENCOUNTER — Ambulatory Visit (INDEPENDENT_AMBULATORY_CARE_PROVIDER_SITE_OTHER): Payer: Medicare HMO | Admitting: Family Medicine

## 2021-07-29 VITALS — BP 116/72 | HR 81 | Temp 98.8°F | Resp 16 | Ht 63.0 in | Wt 122.0 lb

## 2021-07-29 DIAGNOSIS — E785 Hyperlipidemia, unspecified: Secondary | ICD-10-CM

## 2021-07-29 DIAGNOSIS — I152 Hypertension secondary to endocrine disorders: Secondary | ICD-10-CM

## 2021-07-29 DIAGNOSIS — N1831 Chronic kidney disease, stage 3a: Secondary | ICD-10-CM | POA: Insufficient documentation

## 2021-07-29 DIAGNOSIS — E441 Mild protein-calorie malnutrition: Secondary | ICD-10-CM | POA: Insufficient documentation

## 2021-07-29 DIAGNOSIS — M810 Age-related osteoporosis without current pathological fracture: Secondary | ICD-10-CM

## 2021-07-29 DIAGNOSIS — E559 Vitamin D deficiency, unspecified: Secondary | ICD-10-CM

## 2021-07-29 DIAGNOSIS — E1029 Type 1 diabetes mellitus with other diabetic kidney complication: Secondary | ICD-10-CM | POA: Diagnosis not present

## 2021-07-29 DIAGNOSIS — R809 Proteinuria, unspecified: Secondary | ICD-10-CM

## 2021-07-29 DIAGNOSIS — G3184 Mild cognitive impairment, so stated: Secondary | ICD-10-CM

## 2021-07-29 DIAGNOSIS — I700 Atherosclerosis of aorta: Secondary | ICD-10-CM

## 2021-07-29 DIAGNOSIS — R2681 Unsteadiness on feet: Secondary | ICD-10-CM | POA: Diagnosis not present

## 2021-07-29 DIAGNOSIS — E1159 Type 2 diabetes mellitus with other circulatory complications: Secondary | ICD-10-CM

## 2021-07-29 DIAGNOSIS — E1065 Type 1 diabetes mellitus with hyperglycemia: Secondary | ICD-10-CM

## 2021-07-29 MED ORDER — LOSARTAN POTASSIUM-HCTZ 100-12.5 MG PO TABS: 1.0000 | ORAL_TABLET | Freq: Every day | ORAL | 1 refills | Status: DC

## 2021-07-29 MED ORDER — AMLODIPINE BESYLATE 5 MG PO TABS: 5.0000 mg | ORAL_TABLET | Freq: Every day | ORAL | 1 refills | Status: DC

## 2021-07-29 MED ORDER — DONEPEZIL HCL 10 MG PO TBDP: ORAL_TABLET | ORAL | 1 refills | Status: DC

## 2021-07-29 NOTE — Assessment & Plan Note (Signed)
BP oscillates, discussed importance of staying hydrated

## 2021-07-29 NOTE — Assessment & Plan Note (Signed)
Under the care of Dr. Honor Junes  Compliant and A1C has improved

## 2021-07-29 NOTE — Assessment & Plan Note (Signed)
On statin therapy 

## 2021-07-29 NOTE — Assessment & Plan Note (Signed)
Continue statin therapy.

## 2021-07-29 NOTE — Assessment & Plan Note (Signed)
--  Continue ARB 

## 2021-07-29 NOTE — Assessment & Plan Note (Signed)
Resume PT.

## 2021-08-06 ENCOUNTER — Telehealth: Payer: Self-pay

## 2021-08-06 ENCOUNTER — Telehealth: Payer: Medicare HMO

## 2021-08-06 NOTE — Telephone Encounter (Signed)
Pt daugfhter said that the patient fell out of bed Sat and EMS was called and did not see any injuries and did not go to ER, but the patient and her husband tested positive from a home test. Pt husband is being seen today 08-06-2021 with [facility]. Patient daughter wants to know if there is anything they can do for her.Please advise

## 2021-08-07 ENCOUNTER — Ambulatory Visit (INDEPENDENT_AMBULATORY_CARE_PROVIDER_SITE_OTHER): Payer: Medicare HMO

## 2021-08-07 DIAGNOSIS — E1029 Type 1 diabetes mellitus with other diabetic kidney complication: Secondary | ICD-10-CM

## 2021-08-07 DIAGNOSIS — G3184 Mild cognitive impairment, so stated: Secondary | ICD-10-CM

## 2021-08-07 DIAGNOSIS — E1159 Type 2 diabetes mellitus with other circulatory complications: Secondary | ICD-10-CM

## 2021-08-07 NOTE — Chronic Care Management (AMB) (Signed)
Chronic Care Management   CCM RN Visit Note  08/07/2021 Name: [name] MRN: [id number] DOB: 08-19-1940  Subjective: [name] is a 81 y.o. year old female who is a primary care patient of Steele Sizer, MD. The care management team was consulted for assistance with disease management and care coordination needs.    Engaged with patient's spouse by telephone for follow up visit in response to provider referral for case management and care coordination services.   Consent to Services:  The patient was given information about Chronic Care Management services, agreed to services, and gave verbal consent prior to initiation of services.  Please see initial visit note for detailed documentation.   Assessment: Review of patient past medical history, allergies, medications, health status, including review of consultants reports, laboratory and other test data, was performed as part of comprehensive evaluation and provision of chronic care management services.   SDOH (Social Determinants of Health) assessments and interventions performed: No  CCM Care Plan  Allergies  Allergen Reactions   Colesevelam Hcl     scotomas   Daucus Carota    Penicillins Itching    Outpatient Encounter Medications as of 08/07/2021  Medication Sig Note   Accu-Chek Softclix Lancets lancets     alendronate (FOSAMAX) 70 MG tablet TAKE 1 TABLET BY MOUTH EVERY 7 DAYS. TAKE WITH A FULL GLASS OF WATER ON AN EMPTY STOMACH.    amLODipine (NORVASC) 5 MG tablet Take 1 tablet (5 mg total) by mouth daily.    aspirin EC 81 MG tablet Take 81 mg by mouth daily.    atorvastatin (LIPITOR) 80 MG tablet TAKE 1 TABLET BY MOUTH EVERY DAY    blood glucose meter kit and supplies     Calcium Carbonate-Vitamin D (CALCIUM-VITAMIN D) 600-125 MG-UNIT TABS Take 1 tablet by mouth daily.    [employer] ALCOHOL SWABS PADS USE 4 TIMES DAILY TO WIPE SKIN BEFORE USING INSULIN    donepezil (ARICEPT ODT) 10 MG disintegrating tablet TAKE 1 TABLET BY  MOUTH EVERYDAY AT BEDTIME    Glucagon (BAQSIMI ONE PACK) 3 MG/DOSE POWD One spray into nose in case of severe hypoglycemia    hydrALAZINE (APRESOLINE) 10 MG tablet TAKE 1 TABLET (10 MG TOTAL) BY MOUTH 3 (THREE) TIMES DAILY AS NEEDED. BP ABOVE 150/90    insulin aspart (NOVOLOG) 100 UNIT/ML FlexPen 8 units of novolog with every meal. Add sliding scale as necessary.  Max daily dose 50 units.    insulin degludec (TRESIBA FLEXTOUCH) 100 UNIT/ML FlexTouch Pen Inject 24 Units into the skin daily at 12 noon. 01/16/2021: [name] decreased to 22 units   losartan-hydrochlorothiazide (HYZAAR) 100-12.5 MG tablet Take 1 tablet by mouth daily.    No facility-administered encounter medications on file as of 08/07/2021.    Patient Active Problem List   Diagnosis Date Noted   Mild protein-calorie malnutrition ([place]) 07/29/2021   Stage 3a chronic kidney disease ([place]) 07/29/2021   Gait instability 07/29/2021   Type 1 diabetes mellitus with hyperglycemia, with long-term current use of insulin ([place]) 04/28/2018   Atherosclerosis of aorta ([place]) 04/01/2016   Aortic sclerosis 09/19/2014   Hypertension associated with diabetes ([place]) 09/19/2014   Calculus of gallbladder 09/19/2014   Carpal tunnel syndrome 09/19/2014   Detached retina 09/19/2014   Type 1 diabetes mellitus with microalbuminuria ([place]) 09/19/2014   Dyslipidemia 09/19/2014   Hypophosphatemia 09/19/2014   Mild cognitive impairment with memory loss 09/19/2014   Microalbuminuria 09/19/2014   OP (osteoporosis) 09/19/2014   Menopause 09/19/2014   Ptosis  of eyelid 09/19/2014   Hypoglycemia associated with diabetes ([place]) 10/24/2013   Urge incontinence 08/01/2013   Hyperlipidemia due to type 1 diabetes mellitus ([place]) 08/01/2013   Vitamin D deficiency 02/24/2007    Patient Care Plan: RN Care Management Plan of Care     Problem Identified: HTN, HLD, DM, Dementia and Fall Risk      Long-Range Goal: Disease Progression Prevented or Minimized   Start  Date: 08/07/2021  Expected End Date: 11/05/2021  Priority: High  Note:   Current Barriers:  Chronic Disease Management support and education needs related to HTN, HLD, DMI, Dementia, and Fall Risk.  RNCM Clinical Goal(s):  Patient's caregivers will assist with adherence to prescribed treatment plan for HTN, HLD, DMI, Dementia, and Fall Risk through collaboration with the provider RN Care Manager and the care management team.  Interventions: 1:1 collaboration with primary care provider regarding development and update of comprehensive plan of care as evidenced by provider attestation and co-signature Inter-disciplinary care team collaboration (see longitudinal plan of care) Evaluation of current treatment plan related to  self management and patient's adherence to plan as established by provider   Diabetes Interventions:  (Status:  Goal on track:  Yes.) Long Term Goal A1c goal: <7% Reviewed plan with patient's caregiver/spouse.  Reviewed medications. Spouse is managing medications. Reports patient continues to take as prescribed. Denies concerns regarding prescription cost. Discussed blood glucose readings. Spouse reports readings have been fluctuating but have been mostly within range. Unable to complete thorough review of readings today d/t spouse needed to assist [name].  Reviewed complications r/t significantly elevated readings and indications for seeking medical attention.  Hyperlipidemia Interventions:  (Status:  Condition stable.  Not addressed this visit.) Long Term Goal Medications Reviewed. Spouse and caregiver advised to continue monitoring as prescribed. Advised to notify provider if [name] is unable to tolerate the prescribed regimen. Reviewed established cholesterol goals  Counseled on importance of regular laboratory monitoring as prescribed Reviewed importance of limiting foods high in cholesterol  Hypertension Interventions:  Reviewed plan for hypertension  management. Reviewed home BP readings and established range. Spouse continues to monitor daily.  Reports patient has been compliant with medications and diet. Reports bp reading have been within range. Reviewed complications of uncontrolled blood pressure. Spouse reports patient has not been feeling well over the past week. He does not feel it is cardiac related. Reports patient mostly complaining of fatigue. Discussed indications for notifying a provider if a clinic visit is needed. Reviewed s/sx of heart attack, stroke and worsening symptoms that require immediate medical attention.  Patient Goals (Caregivers will Assist): Take all medications as prescribed Attend all scheduled provider appointments Call pharmacy for medication refills 3-7 days in advance of running out of medications Call provider office for new concerns or questions    Follow Up Plan:   Will follow up next month      PLAN: A member of the care management team will follow up next month.   [facility] Care Management [facility] [phone]

## 2021-09-05 DIAGNOSIS — I10 Essential (primary) hypertension: Secondary | ICD-10-CM | POA: Diagnosis not present

## 2021-09-05 DIAGNOSIS — E785 Hyperlipidemia, unspecified: Secondary | ICD-10-CM | POA: Diagnosis not present

## 2021-09-05 DIAGNOSIS — E1159 Type 2 diabetes mellitus with other circulatory complications: Secondary | ICD-10-CM | POA: Diagnosis not present

## 2021-09-19 ENCOUNTER — Ambulatory Visit: Payer: Self-pay

## 2021-09-19 DIAGNOSIS — I152 Hypertension secondary to endocrine disorders: Secondary | ICD-10-CM

## 2021-09-19 DIAGNOSIS — I700 Atherosclerosis of aorta: Secondary | ICD-10-CM

## 2021-09-19 DIAGNOSIS — Z9181 History of falling: Secondary | ICD-10-CM

## 2021-09-19 DIAGNOSIS — G3184 Mild cognitive impairment, so stated: Secondary | ICD-10-CM

## 2021-09-19 DIAGNOSIS — E1029 Type 1 diabetes mellitus with other diabetic kidney complication: Secondary | ICD-10-CM

## 2021-09-22 NOTE — Chronic Care Management (AMB) (Signed)
Care Management    RN Visit Note   Name: [name] MRN: [id number] DOB: 1940-05-05  Subjective: [name] is a 81 y.o. year old female who is a primary care patient of Nichole Sizer, MD. The care management team was consulted for assistance with disease management and care coordination needs.    Engaged with patient's spouse/caregiver by telephone for follow up visit in response to provider referral for case management and care coordination services.   Consent to Services:   [name] 's spouse/caregiver was given information about Care Management services including:  Care Management services includes personalized support from designated clinical staff supervised by her physician, including individualized plan of care and coordination with other care providers 24/7 contact phone numbers for assistance for urgent and routine care needs. The patient may stop case management services at any time by phone call to the office staff.  Patient's spouse agreed to services and consent obtained.   Assessment: Review of patient past medical history, allergies, medications, health status, including review of consultants reports, laboratory and other test data, was performed as part of comprehensive evaluation and provision of chronic care management services.   SDOH (Social Determinants of Health) assessments and interventions performed: No  Care Plan  Allergies  Allergen Reactions   Colesevelam Hcl     scotomas   Daucus Carota    Penicillins Itching    Outpatient Encounter Medications as of 09/19/2021  Medication Sig Note   Accu-Chek Softclix Lancets lancets     alendronate (FOSAMAX) 70 MG tablet TAKE 1 TABLET BY MOUTH EVERY 7 DAYS. TAKE WITH A FULL GLASS OF WATER ON AN EMPTY STOMACH.    amLODipine (NORVASC) 5 MG tablet Take 1 tablet (5 mg total) by mouth daily.    aspirin EC 81 MG tablet Take 81 mg by mouth daily.    atorvastatin (LIPITOR) 80 MG tablet TAKE 1 TABLET BY MOUTH  EVERY DAY    blood glucose meter kit and supplies     Calcium Carbonate-Vitamin D (CALCIUM-VITAMIN D) 600-125 MG-UNIT TABS Take 1 tablet by mouth daily.    [employer] ALCOHOL SWABS PADS USE 4 TIMES DAILY TO WIPE SKIN BEFORE USING INSULIN    donepezil (ARICEPT ODT) 10 MG disintegrating tablet TAKE 1 TABLET BY MOUTH EVERYDAY AT BEDTIME    Glucagon (BAQSIMI ONE PACK) 3 MG/DOSE POWD One spray into nose in case of severe hypoglycemia    hydrALAZINE (APRESOLINE) 10 MG tablet TAKE 1 TABLET (10 MG TOTAL) BY MOUTH 3 (THREE) TIMES DAILY AS NEEDED. BP ABOVE 150/90    insulin aspart (NOVOLOG) 100 UNIT/ML FlexPen 8 units of novolog with every meal. Add sliding scale as necessary.  Max daily dose 50 units.    insulin degludec (TRESIBA FLEXTOUCH) 100 UNIT/ML FlexTouch Pen Inject 24 Units into the skin daily at 12 noon. 01/16/2021: [name] decreased to 22 units   losartan-hydrochlorothiazide (HYZAAR) 100-12.5 MG tablet Take 1 tablet by mouth daily.    No facility-administered encounter medications on file as of 09/19/2021.    Patient Active Problem List   Diagnosis Date Noted   Mild protein-calorie malnutrition ([place]) 07/29/2021   Stage 3a chronic kidney disease ([place]) 07/29/2021   Gait instability 07/29/2021   Type 1 diabetes mellitus with hyperglycemia, with long-term current use of insulin ([place]) 04/28/2018   Atherosclerosis of aorta ([place]) 04/01/2016   Aortic sclerosis 09/19/2014   Hypertension associated with diabetes ([place]) 09/19/2014   Calculus of gallbladder 09/19/2014   Carpal tunnel syndrome 09/19/2014   Detached  retina 09/19/2014   Type 1 diabetes mellitus with microalbuminuria ([place]) 09/19/2014   Dyslipidemia 09/19/2014   Hypophosphatemia 09/19/2014   Mild cognitive impairment with memory loss 09/19/2014   Microalbuminuria 09/19/2014   OP (osteoporosis) 09/19/2014   Menopause 09/19/2014   Ptosis of eyelid 09/19/2014   Hypoglycemia associated with diabetes ([place]) 10/24/2013   Urge incontinence  08/01/2013   Hyperlipidemia due to type 1 diabetes mellitus ([place]) 08/01/2013   Vitamin D deficiency 02/24/2007    Patient Care Plan: RN Care Management Plan of Care     Problem Identified: HTN, HLD, DM, Dementia and Fall Risk      Long-Range Goal: Disease Progression Prevented or Minimized   Start Date: 08/07/2021  Expected End Date: 11/05/2021  Priority: High  Note:   Current Barriers:  Disease Management support and education needs related to HTN, HLD, DMI, Dementia, and Fall Risk.  RNCM Clinical Goal(s):  Patient's caregivers will assist with adherence to prescribed treatment plan for HTN, HLD, DMI, Dementia, and Fall Risk through collaboration with the provider RN Care Manager and the care management team.  Interventions: 1:1 collaboration with primary care provider regarding development and update of comprehensive plan of care as evidenced by provider attestation and co-signature Inter-disciplinary care team collaboration (see longitudinal plan of care) Evaluation of current treatment plan related to  self management and patient's adherence to plan as established by provider   Diabetes Interventions:   A1c goal: <7% Reviewed plan for diabetes management with patient's caregiver/spouse. Spouse continues to manage and prepare medications. Reports patient continues to take medications as prescribed. Reports good nutritional intake. Reports they are closely monitoring her intake of carbs and concentrated sugars. Reviewed blood glucose readings over the past week. Reports readings were elevated in the 180's and 190's during the earlier part of the week. Notes one significantly elevated fasting blood glucose reading in the 300's. Reports he is unclear what triggered the elevated reading. Recalls her consuming a balanced meal the evening prior. Reports reading came down to 202 after receiving insulin. Fasting reading was 120 mg/dl today. Reviewed symptoms. Reports patient has not  experienced symptoms r/t hypoglycemia or hyperglycemia.  Advised to complete follow up as scheduled with Dr. O'Conner/Endocrinologist on 09/23/21.  Reviewed complications r/t significantly elevated readings and indications for seeking medical attention.  Hyperlipidemia Interventions:   Reviewed plan for cholesterol/hyperlipidemia management. Spouse reports patient is compliant with plan and tolerating statin well. Reviewed need to continue monitoring patient's intake, limit foods high in cholesterol and avoid highly processed foods when possible.  Advised to complete follow up labs as scheduled.  Hypertension Interventions:  Reviewed plan for hypertension management. Spouse some fluctuations with systolic readings but reports overall most readings have been within range. Reports reading today was 137/77. Reports patient is taking medications as prescribed and doing well. Reviewed indications for notifying a provider for readings outside of established parameters. Advised to continue monitoring daily and maintain a log. Reviewed s/sx of heart attack, stroke and worsening symptoms that require immediate medical attention.  Fall Risk Interventions: Reviewed provider established cholesterol and lipid goals. She is unable to tolerate statins. Reports taking Zetia as prescribed. Discussed importance of completing labs as ordered.  Reviewed safety and fall precautions. Spouse reports patient has not experienced recent falls. Reports she utilizes a cane or walker with all ambulation.  Discussed functional status in the home. Spouse reports he and the couple's daughter continue to serve as primary caregivers and are readily available to assist patient as needed. Reports  she requires some assistance with ADL's d/t balance and cognitive decline but does well with prompting. Reports they continue to receive assistance four times a week via caregiver services with [employer]. Declines need for additional  in-home assistance. Agreed to contact the clinic if patient's functional status declines.   Patient Goals (Caregivers will Assist): Take all medications as prescribed Attend all scheduled provider appointments Call pharmacy for medication refills 3-7 days in advance of running out of medications Call provider office for new concerns or questions       PLAN:  [name]'s spouse will contact the clinic if the family requires additional outreach. The care team will gladly assist.   [name] [facility] Care Management [phone]

## 2021-09-23 DIAGNOSIS — E785 Hyperlipidemia, unspecified: Secondary | ICD-10-CM | POA: Diagnosis not present

## 2021-09-23 DIAGNOSIS — E1065 Type 1 diabetes mellitus with hyperglycemia: Secondary | ICD-10-CM | POA: Diagnosis not present

## 2021-09-23 DIAGNOSIS — E1069 Type 1 diabetes mellitus with other specified complication: Secondary | ICD-10-CM | POA: Diagnosis not present

## 2021-09-23 DIAGNOSIS — M810 Age-related osteoporosis without current pathological fracture: Secondary | ICD-10-CM | POA: Diagnosis not present

## 2021-09-23 DIAGNOSIS — I152 Hypertension secondary to endocrine disorders: Secondary | ICD-10-CM | POA: Diagnosis not present

## 2021-09-23 LAB — HEMOGLOBIN A1C: Hemoglobin A1C: 7.8

## 2021-10-24 ENCOUNTER — Ambulatory Visit: Payer: Self-pay

## 2021-10-24 NOTE — Chronic Care Management (AMB) (Signed)
   10/24/2021  [name] 1940/06/24 [id number]  Documentation encounter created to complete case transition. The care management team will continue to follow for care coordination as needed.   [facility] [phone]

## 2022-01-26 NOTE — Progress Notes (Unsigned)
Name: [name]   MRN: [id number]    DOB: 07-20-1940   Date:01/27/2022       Progress Note  Subjective  Chief Complaint  Follow Up  HPI  Gait instability: she had home PT in the past to improve gait and seems to help temporarily , husband does not continue the PT when they stop going to her house. Advised to go on daily walks with assistance   Malnutrition: she has lost 7 lbs in the past 3 months, and 15 lbs in the previous year, but now appetite is good and weight is trending up again  CKI stage III: we will recheck urine micro and CMP with GFR today, denies pruritis, she wears depends for incontinence but has good urine volume. She takes ARB   Diabetes type I:  She is seeing Dr. Honor Junes, last A1C was 9.7 % was April 2022 but in July was 7.8 %, glucose still has a wide range, lowest in mid 60's high in mid 200's  She has associated dyslipidemia, CKI and HTN. Taking ARB, statin therapy , she is up to date with eye exam.   HTN: bp has been at goal lately, around 130's/70's  taking medications , no chest pain , dizziness or palpitation  Atherosclerosis of aorta: taking high dose Atorvastatin , last LDL was 83, goal is below 70 , discussed with family members and they prefer not changing or adding medications we will recheck it today, if still not at goal we can change to Rosuvastatin 40 mg   Gallbladder stone: no symptoms, appetite has been good, no pain , nausea or vomiting Unchanged   Cognitive Dysfunction: seen by neurologist years ago, at the time Dr. Manuella Ghazi felt secondary to metabolic dysfunction due to uncontrolled DM, family states worse symptoms is short term memory loss. Cooperative, sometimes she gets agitated at night. No wondering. She is taking Aricept since Fall 2022, memory is stable. Husband states she never complains    Patient Active Problem List   Diagnosis Date Noted   Mild protein-calorie malnutrition ([place]) 07/29/2021   Stage 3a chronic kidney disease ([place])  07/29/2021   Gait instability 07/29/2021   Type 1 diabetes mellitus with hyperglycemia, with long-term current use of insulin ([place]) 04/28/2018   Atherosclerosis of aorta ([place]) 04/01/2016   Aortic sclerosis 09/19/2014   Hypertension associated with diabetes ([place]) 09/19/2014   Calculus of gallbladder 09/19/2014   Carpal tunnel syndrome 09/19/2014   Detached retina 09/19/2014   Type 1 diabetes mellitus with microalbuminuria ([place]) 09/19/2014   Dyslipidemia 09/19/2014   Hypophosphatemia 09/19/2014   Mild cognitive impairment with memory loss 09/19/2014   Microalbuminuria 09/19/2014   OP (osteoporosis) 09/19/2014   Menopause 09/19/2014   Ptosis of eyelid 09/19/2014   Hypoglycemia associated with diabetes ([place]) 10/24/2013   Urge incontinence 08/01/2013   Hyperlipidemia due to type 1 diabetes mellitus ([place]) 08/01/2013   Vitamin D deficiency 02/24/2007    Past Surgical History:  Procedure Laterality Date   BREAST EXCISIONAL BIOPSY Left    neg   BREAST SURGERY  1970   EYE SURGERY      Family History  Problem Relation Age of Onset   Alzheimer's disease Mother    Diabetes Mother    Arthritis Mother    Diabetes Father    Hypertension Father    Hyperlipidemia Father    Stroke Brother    Multiple sclerosis Daughter     Social History   Tobacco Use   Smoking status: Never   Smokeless  tobacco: Never   Tobacco comments:    smoking cessation materials not required  Substance Use Topics   Alcohol use: No    Alcohol/week: 0.0 standard drinks of alcohol     Current Outpatient Medications:    Accu-Chek Softclix Lancets lancets, , Disp: , Rfl:    alendronate (FOSAMAX) 70 MG tablet, TAKE 1 TABLET BY MOUTH EVERY 7 DAYS. TAKE WITH A FULL GLASS OF WATER ON AN EMPTY STOMACH., Disp: 12 tablet, Rfl: 1   amLODipine (NORVASC) 5 MG tablet, Take 1 tablet (5 mg total) by mouth daily., Disp: 90 tablet, Rfl: 1   aspirin EC 81 MG tablet, Take 81 mg by mouth daily., Disp: , Rfl:    atorvastatin  (LIPITOR) 80 MG tablet, TAKE 1 TABLET BY MOUTH EVERY DAY, Disp: 90 tablet, Rfl: 1   blood glucose meter kit and supplies, , Disp: , Rfl:    Calcium Carbonate-Vitamin D (CALCIUM-VITAMIN D) 600-125 MG-UNIT TABS, Take 1 tablet by mouth daily., Disp: , Rfl:    CVS ALCOHOL SWABS PADS, USE 4 TIMES DAILY TO WIPE SKIN BEFORE USING INSULIN, Disp: 400 each, Rfl: 2   donepezil (ARICEPT ODT) 10 MG disintegrating tablet, TAKE 1 TABLET BY MOUTH EVERYDAY AT BEDTIME, Disp: 90 tablet, Rfl: 1   Glucagon (BAQSIMI ONE PACK) 3 MG/DOSE POWD, One spray into nose in case of severe hypoglycemia, Disp: , Rfl:    hydrALAZINE (APRESOLINE) 10 MG tablet, TAKE 1 TABLET (10 MG TOTAL) BY MOUTH 3 (THREE) TIMES DAILY AS NEEDED. BP ABOVE 150/90, Disp: 270 tablet, Rfl: 0   insulin aspart (NOVOLOG) 100 UNIT/ML FlexPen, 8 units of novolog with every meal. Add sliding scale as necessary.  Max daily dose 50 units., Disp: , Rfl:    insulin degludec (TRESIBA FLEXTOUCH) 100 UNIT/ML FlexTouch Pen, Inject 22 Units into the skin daily at 12 noon., Disp: , Rfl:    losartan-hydrochlorothiazide (HYZAAR) 100-12.5 MG tablet, Take 1 tablet by mouth daily., Disp: 90 tablet, Rfl: 1  Allergies  Allergen Reactions   Colesevelam Hcl     scotomas   Daucus Carota    Penicillins Itching    I personally reviewed active problem list, medication list, allergies, family history, social history, health maintenance with the patient/caregiver today.   ROS  Constitutional: Negative for fever or significant  weight change.  Respiratory: Negative for cough and shortness of breath.   Cardiovascular: Negative for chest pain or palpitations.  Gastrointestinal: Negative for abdominal pain, no bowel changes.  Musculoskeletal: positive for gait problem but no  joint swelling.  Skin: Negative for rash.  Neurological: Negative for dizziness or headache.  No other specific complaints in a complete review of systems (except as listed in HPI above).    Objective  Vitals:   01/27/22 1313  BP: 126/70  Pulse: (!) 59  Resp: 16  SpO2: 97%  Weight: 126 lb (57.2 kg)  Height: _0  (1.6 m)    Body mass index is 22.32 kg/m.  Physical Exam  Constitutional: Patient appears well-developed and well-nourished.  No distress.  HEENT: head atraumatic, normocephalic, pupils equal and reactive to light,  neck supple Cardiovascular: Normal rate, regular rhythm and normal heart sounds.  No murmur heard. No BLE edema. Pulmonary/Chest: Effort normal and breath sounds normal. No respiratory distress. Abdominal: Soft.  There is no tenderness. Psychiatric: quiet, smiling, family did most of the talking    PHQ2/9:    01/27/2022    1:12 PM 07/29/2021    1:21 PM 01/16/2021  1:47 PM 12/30/2020    1:44 PM 04/09/2020    3:40 PM  Depression screen PHQ 2/9  Decreased Interest 0 0 0 0 0  Down, Depressed, Hopeless 0 0 0 0 0  PHQ - 2 Score 0 0 0 0 0  Altered sleeping 0      Tired, decreased energy 0      Change in appetite 0      Feeling bad or failure about yourself  0      Trouble concentrating 0      Moving slowly or fidgety/restless 0      Suicidal thoughts 0      PHQ-9 Score 0        phq 9 is negative   Fall Risk:    01/27/2022    1:11 PM 07/29/2021    1:21 PM 01/16/2021    1:06 PM 12/30/2020    1:44 PM 04/09/2020    3:40 PM  Fall Risk   Falls in the past year? 1 0 0 0 0  Number falls in past yr: 0 0 0 0 0  Injury with Fall? 0 0  0 0  Risk for fall due to : Impaired balance/gait Impaired balance/gait Medication side effect;Impaired balance/gait Impaired balance/gait   Follow up Falls prevention discussed Falls prevention discussed Falls prevention discussed Falls prevention discussed       Functional Status Survey: Is the patient deaf or have difficulty hearing?: No Does the patient have difficulty seeing, even when wearing glasses/contacts?: No Does the patient have difficulty concentrating, remembering, or making decisions?:  Yes Does the patient have difficulty walking or climbing stairs?: Yes Does the patient have difficulty dressing or bathing?: Yes Does the patient have difficulty doing errands alone such as visiting a doctor's office or shopping?: Yes    Assessment & Plan  1. Type 1 diabetes mellitus with microalbuminuria (HCC)  - Urine Microalbumin w/creat. ratio - COMPLETE METABOLIC PANEL WITH GFR - TSH  2. Need for immunization against influenza  - Flu Vaccine QUAD High Dose(Fluad)  3. Stage 3a chronic kidney disease (HCC)  - CBC with Differential/Platelet - losartan-hydrochlorothiazide (HYZAAR) 100-12.5 MG tablet; Take 1 tablet by mouth daily.  Dispense: 90 tablet; Refill: 1  4. Age-related osteoporosis without current pathological fracture  - TSH - alendronate (FOSAMAX) 70 MG tablet; Take with a full glass of water on an empty stomach.  Dispense: 12 tablet; Refill: 1  5.Mild cognitive impairment with memory loss  - donepezil (ARICEPT ODT) 10 MG disintegrating tablet; TAKE 1 TABLET BY MOUTH EVERYDAY AT BEDTIME  Dispense: 90 tablet; Refill: 1  6. Atherosclerosis of aorta (HCC)  - Lipid panel - atorvastatin (LIPITOR) 80 MG tablet; Take 1 tablet (80 mg total) by mouth daily.  Dispense: 90 tablet; Refill: 1  7. Hypertension associated with diabetes ([place])  - amLODipine (NORVASC) 5 MG tablet; Take 1 tablet (5 mg total) by mouth daily.  Dispense: 90 tablet; Refill: 1 - losartan-hydrochlorothiazide (HYZAAR) 100-12.5 MG tablet; Take 1 tablet by mouth daily.  Dispense: 90 tablet; Refill: 1 - hydrALAZINE (APRESOLINE) 10 MG tablet; Take 1 tablet (10 mg total) by mouth 3 (three) times daily as needed. bp above 150/90  Dispense: 270 tablet; Refill: 0  8. Gait instability   9. Vitamin D deficiency   10. Dyslipidemia  - atorvastatin (LIPITOR) 80 MG tablet; Take 1 tablet (80 mg total) by mouth daily.  Dispense: 90 tablet; Refill: 1

## 2022-01-27 ENCOUNTER — Encounter: Payer: Self-pay | Admitting: Family Medicine

## 2022-01-27 ENCOUNTER — Ambulatory Visit (INDEPENDENT_AMBULATORY_CARE_PROVIDER_SITE_OTHER): Payer: Medicare HMO | Admitting: Family Medicine

## 2022-01-27 VITALS — BP 126/70 | HR 59 | Resp 16 | Ht 63.0 in | Wt 126.0 lb

## 2022-01-27 DIAGNOSIS — G3184 Mild cognitive impairment, so stated: Secondary | ICD-10-CM

## 2022-01-27 DIAGNOSIS — I700 Atherosclerosis of aorta: Secondary | ICD-10-CM

## 2022-01-27 DIAGNOSIS — M810 Age-related osteoporosis without current pathological fracture: Secondary | ICD-10-CM | POA: Diagnosis not present

## 2022-01-27 DIAGNOSIS — I152 Hypertension secondary to endocrine disorders: Secondary | ICD-10-CM

## 2022-01-27 DIAGNOSIS — E1159 Type 2 diabetes mellitus with other circulatory complications: Secondary | ICD-10-CM | POA: Diagnosis not present

## 2022-01-27 DIAGNOSIS — R809 Proteinuria, unspecified: Secondary | ICD-10-CM

## 2022-01-27 DIAGNOSIS — Z23 Encounter for immunization: Secondary | ICD-10-CM

## 2022-01-27 DIAGNOSIS — E559 Vitamin D deficiency, unspecified: Secondary | ICD-10-CM

## 2022-01-27 DIAGNOSIS — R2681 Unsteadiness on feet: Secondary | ICD-10-CM | POA: Diagnosis not present

## 2022-01-27 DIAGNOSIS — E785 Hyperlipidemia, unspecified: Secondary | ICD-10-CM

## 2022-01-27 DIAGNOSIS — E1029 Type 1 diabetes mellitus with other diabetic kidney complication: Secondary | ICD-10-CM | POA: Diagnosis not present

## 2022-01-27 DIAGNOSIS — N1831 Chronic kidney disease, stage 3a: Secondary | ICD-10-CM

## 2022-01-27 MED ORDER — LOSARTAN POTASSIUM-HCTZ 100-12.5 MG PO TABS: 1.0000 | ORAL_TABLET | Freq: Every day | ORAL | 1 refills | Status: DC

## 2022-01-27 MED ORDER — HYDRALAZINE HCL 10 MG PO TABS: 10.0000 mg | ORAL_TABLET | Freq: Three times a day (TID) | ORAL | 0 refills | Status: DC | PRN

## 2022-01-27 MED ORDER — ALENDRONATE SODIUM 70 MG PO TABS: ORAL_TABLET | ORAL | 1 refills | Status: DC

## 2022-01-27 MED ORDER — DONEPEZIL HCL 10 MG PO TBDP: ORAL_TABLET | ORAL | 1 refills | Status: DC

## 2022-01-27 MED ORDER — ATORVASTATIN CALCIUM 80 MG PO TABS: 80.0000 mg | ORAL_TABLET | Freq: Every day | ORAL | 1 refills | Status: DC

## 2022-01-27 MED ORDER — AMLODIPINE BESYLATE 5 MG PO TABS: 5.0000 mg | ORAL_TABLET | Freq: Every day | ORAL | 1 refills | Status: DC

## 2022-01-28 LAB — CBC WITH DIFFERENTIAL/PLATELET
Absolute Monocytes: 556 cells/uL (ref 200–950)
Basophils Absolute: 34 cells/uL (ref 0–200)
Basophils Relative: 0.5 %
Eosinophils Absolute: 121 cells/uL (ref 15–500)
Eosinophils Relative: 1.8 %
HCT: 38.6 % (ref 35.0–45.0)
Hemoglobin: 12.9 g/dL (ref 11.7–15.5)
Lymphs Abs: 2667 cells/uL (ref 850–3900)
MCH: 29.5 pg (ref 27.0–33.0)
MCHC: 33.4 g/dL (ref 32.0–36.0)
MCV: 88.3 fL (ref 80.0–100.0)
MPV: 11 fL (ref 7.5–12.5)
Monocytes Relative: 8.3 %
Neutro Abs: 3323 cells/uL (ref 1500–7800)
Neutrophils Relative %: 49.6 %
Platelets: 253 10*3/uL (ref 140–400)
RBC: 4.37 10*6/uL (ref 3.80–5.10)
RDW: 12.7 % (ref 11.0–15.0)
Total Lymphocyte: 39.8 %
WBC: 6.7 10*3/uL (ref 3.8–10.8)

## 2022-01-28 LAB — COMPLETE METABOLIC PANEL WITH GFR
AG Ratio: 1.3 (calc) (ref 1.0–2.5)
ALT: 40 U/L — ABNORMAL HIGH (ref 6–29)
AST: 24 U/L (ref 10–35)
Albumin: 4.1 g/dL (ref 3.6–5.1)
Alkaline phosphatase (APISO): 88 U/L (ref 37–153)
BUN: 14 mg/dL (ref 7–25)
CO2: 32 mmol/L (ref 20–32)
Calcium: 9.6 mg/dL (ref 8.6–10.4)
Chloride: 103 mmol/L (ref 98–110)
Creat: 0.71 mg/dL (ref 0.60–1.00)
Globulin: 3.2 g/dL (calc) (ref 1.9–3.7)
Glucose, Bld: 171 mg/dL — ABNORMAL HIGH (ref 65–99)
Potassium: 3.8 mmol/L (ref 3.5–5.3)
Sodium: 142 mmol/L (ref 135–146)
Total Bilirubin: 0.7 mg/dL (ref 0.2–1.2)
Total Protein: 7.3 g/dL (ref 6.1–8.1)
eGFR: 87 mL/min/{1.73_m2} (ref >=60)

## 2022-01-28 LAB — LIPID PANEL
Cholesterol: 119 mg/dL (ref <200)
HDL: 35 mg/dL — ABNORMAL LOW (ref >=50)
LDL Cholesterol (Calc): 67 mg/dL (calc)
Non-HDL Cholesterol (Calc): 84 mg/dL (calc) (ref <130)
Total CHOL/HDL Ratio: 3.4 (calc) (ref <5.0)
Triglycerides: 91 mg/dL (ref <150)

## 2022-01-28 LAB — TSH: TSH: 0.75 mIU/L (ref 0.40–4.50)

## 2022-01-28 LAB — MICROALBUMIN / CREATININE URINE RATIO
Creatinine, Urine: 113 mg/dL (ref 20–275)
Microalb Creat Ratio: 5 mcg/mg creat (ref <30)
Microalb, Ur: 0.6 mg/dL

## 2022-02-10 DIAGNOSIS — M810 Age-related osteoporosis without current pathological fracture: Secondary | ICD-10-CM | POA: Diagnosis not present

## 2022-02-10 DIAGNOSIS — E785 Hyperlipidemia, unspecified: Secondary | ICD-10-CM | POA: Diagnosis not present

## 2022-02-10 DIAGNOSIS — E1065 Type 1 diabetes mellitus with hyperglycemia: Secondary | ICD-10-CM | POA: Diagnosis not present

## 2022-02-10 DIAGNOSIS — E1069 Type 1 diabetes mellitus with other specified complication: Secondary | ICD-10-CM | POA: Diagnosis not present

## 2022-02-10 DIAGNOSIS — I152 Hypertension secondary to endocrine disorders: Secondary | ICD-10-CM | POA: Diagnosis not present

## 2022-02-27 DIAGNOSIS — E1040 Type 1 diabetes mellitus with diabetic neuropathy, unspecified: Secondary | ICD-10-CM | POA: Diagnosis not present

## 2022-02-27 DIAGNOSIS — L600 Ingrowing nail: Secondary | ICD-10-CM | POA: Diagnosis not present

## 2022-02-27 DIAGNOSIS — B351 Tinea unguium: Secondary | ICD-10-CM | POA: Diagnosis not present

## 2022-03-23 NOTE — Progress Notes (Unsigned)
   Acute Office Visit  Subjective:     Patient ID: [name], female    DOB: March 15, 1940, 82 y.o.   MRN: [id number]  No chief complaint on file.   HPI Patient is in today for breast lump.   Breast Lump:  -Duration :{Blank single:19197::"days","weeks","months"} -Location: {Blank single:19197::"right","left","bilateral"} -Onset: {Blank single:19197::"sudden","gradual"} -Severity: {Blank single:19197::"mild","moderate","severe","1/10","2/10","3/10","4/10","5/10","6/10","7/10","8/10","9/10","10/10"} -Quality: {Blank multiple:19196::"sharp","dull","aching","burning","cramping","ill-defined","itchy","pressure-like","pulling","shooting","sore","stabbing","tender","tearing","throbbing"} -Frequency: {Blank single:19197::"constant","intermittent","occasional","rare","every few minutes","a few times a hour","a few times a day","a few times a week","a few times a month","a few times a year"} -Redness: {Blank single:19197::"yes","no"} -Swelling: {Blank single:19197::"yes","no"} -Trauma: {Blank single:19197::"trauma","no trauma"} -Breastfeeding: {Blank single:19197::"yes","no"} -Associated with menstral cycle: {Blank single:19197::"yes","no"} -Nipple discharge: {Blank single:19197::"yes","no"} -Breast lump: {Blank single:19197::"yes","no"} -Status: {Blank multiple:19196::"better","worse","stable","fluctuating"} -Treatments attempted: {Blank multiple:19196::"none","primrose oil","ibuprofen","tylenol"} -Previous mammogram: 05/20/2016   ROS      Objective:    There were no vitals taken for this visit. {Vitals History (Optional):23777}  Physical Exam  No results found for any visits on 03/24/22.      Assessment & Plan:   Problem List Items Addressed This Visit   None   No orders of the defined types were placed in this encounter.   No follow-ups on file.  Teodora Medici, DO

## 2022-03-24 ENCOUNTER — Encounter: Payer: Self-pay | Admitting: Internal Medicine

## 2022-03-24 ENCOUNTER — Ambulatory Visit (INDEPENDENT_AMBULATORY_CARE_PROVIDER_SITE_OTHER): Payer: Medicare HMO | Admitting: Internal Medicine

## 2022-03-24 VITALS — BP 122/84 | HR 66 | Temp 97.5°F | Resp 18 | Ht 63.0 in | Wt 130.0 lb

## 2022-03-24 DIAGNOSIS — N6325 Unspecified lump in the left breast, overlapping quadrants: Secondary | ICD-10-CM | POA: Diagnosis not present

## 2022-03-24 NOTE — Patient Instructions (Addendum)
It was great seeing you today!  Plan discussed at today's visit: -Mammogram and ultrasounds ordered, call number on the card to schedule  Follow up in: June, already scheduled   Take care and let us know if you have any questions or concerns prior to your next visit.  Dr. Rosana Berger

## 2022-03-30 ENCOUNTER — Ambulatory Visit
Admission: RE | Admit: 2022-03-30 | Discharge: 2022-03-30 | Disposition: A | Discharge status: Home / Self Care | Payer: Medicare HMO | Source: Ambulatory Visit | Attending: Internal Medicine | Admitting: Internal Medicine

## 2022-03-30 DIAGNOSIS — N6321 Unspecified lump in the left breast, upper outer quadrant: Secondary | ICD-10-CM | POA: Diagnosis not present

## 2022-03-30 DIAGNOSIS — N6325 Unspecified lump in the left breast, overlapping quadrants: Secondary | ICD-10-CM | POA: Insufficient documentation

## 2022-03-30 DIAGNOSIS — R922 Inconclusive mammogram: Secondary | ICD-10-CM | POA: Diagnosis not present

## 2022-03-31 ENCOUNTER — Other Ambulatory Visit: Payer: Self-pay | Admitting: Internal Medicine

## 2022-03-31 DIAGNOSIS — N63 Unspecified lump in unspecified breast: Secondary | ICD-10-CM

## 2022-03-31 DIAGNOSIS — R928 Other abnormal and inconclusive findings on diagnostic imaging of breast: Secondary | ICD-10-CM

## 2022-03-31 DIAGNOSIS — R599 Enlarged lymph nodes, unspecified: Secondary | ICD-10-CM

## 2022-04-07 ENCOUNTER — Ambulatory Visit
Admission: RE | Admit: 2022-04-07 | Discharge: 2022-04-07 | Disposition: A | Discharge status: Home / Self Care | Payer: Medicare HMO | Source: Ambulatory Visit | Attending: Internal Medicine | Admitting: Internal Medicine

## 2022-04-07 DIAGNOSIS — R590 Localized enlarged lymph nodes: Secondary | ICD-10-CM | POA: Diagnosis not present

## 2022-04-07 DIAGNOSIS — N63 Unspecified lump in unspecified breast: Secondary | ICD-10-CM | POA: Insufficient documentation

## 2022-04-07 DIAGNOSIS — R928 Other abnormal and inconclusive findings on diagnostic imaging of breast: Secondary | ICD-10-CM

## 2022-04-07 DIAGNOSIS — R599 Enlarged lymph nodes, unspecified: Secondary | ICD-10-CM | POA: Diagnosis not present

## 2022-04-07 DIAGNOSIS — N6321 Unspecified lump in the left breast, upper outer quadrant: Secondary | ICD-10-CM | POA: Diagnosis not present

## 2022-04-07 DIAGNOSIS — C50412 Malignant neoplasm of upper-outer quadrant of left female breast: Secondary | ICD-10-CM | POA: Diagnosis not present

## 2022-04-07 HISTORY — PX: BREAST BIOPSY: SHX20

## 2022-04-07 HISTORY — PX: AXILLARY LYMPH NODE BIOPSY: SHX5737

## 2022-04-07 MED ORDER — LIDOCAINE-EPINEPHRINE 1 %-1:100000 IJ SOLN
20.0000 mL | Freq: Once | INTRAMUSCULAR | Status: AC
  Administered 2022-04-07: 20 mL via INTRADERMAL
  Filled 2022-04-07: qty 20

## 2022-04-07 MED ORDER — LIDOCAINE HCL (PF) 1 % IJ SOLN
10.0000 mL | Freq: Once | INTRAMUSCULAR | Status: AC
  Administered 2022-04-07: 10 mL via INTRADERMAL
  Filled 2022-04-07: qty 10

## 2022-04-08 ENCOUNTER — Encounter: Payer: Self-pay | Admitting: *Deleted

## 2022-04-08 LAB — SURGICAL PATHOLOGY

## 2022-04-08 NOTE — Progress Notes (Signed)
Received referral for newly diagnosed breast cancer from [facility].  Navigation initiated.  Spoke with Ms. Wieser's daughter Arrie Aran and she is going to decide Financial trader and med onc they would like to see and call me with their choices.

## 2022-04-10 ENCOUNTER — Encounter: Payer: Self-pay | Admitting: *Deleted

## 2022-04-10 DIAGNOSIS — C50919 Malignant neoplasm of unspecified site of unspecified female breast: Secondary | ICD-10-CM

## 2022-04-10 NOTE — Progress Notes (Signed)
Spoke with Dawn and they would like to see Dr. Janese Banks and Dr. Peyton Najjar.   Appt. Scheduled for Tuesday 2/6 with Dr. Janese Banks and with Dr. Peyton Najjar for Tues Feb. 13.

## 2022-04-14 ENCOUNTER — Other Ambulatory Visit: Payer: Self-pay | Admitting: *Deleted

## 2022-04-14 ENCOUNTER — Encounter: Payer: Self-pay | Admitting: *Deleted

## 2022-04-14 ENCOUNTER — Inpatient Hospital Stay: Payer: Medicare HMO

## 2022-04-14 ENCOUNTER — Telehealth: Payer: Self-pay | Admitting: Licensed Clinical Social Worker

## 2022-04-14 ENCOUNTER — Inpatient Hospital Stay: Payer: Medicare HMO | Attending: Oncology | Admitting: Oncology

## 2022-04-14 ENCOUNTER — Encounter: Payer: Self-pay | Admitting: Oncology

## 2022-04-14 VITALS — BP 131/63 | HR 66 | Temp 97.6°F | Resp 20 | Wt 134.0 lb

## 2022-04-14 DIAGNOSIS — Z7189 Other specified counseling: Secondary | ICD-10-CM

## 2022-04-14 DIAGNOSIS — C50012 Malignant neoplasm of nipple and areola, left female breast: Secondary | ICD-10-CM | POA: Diagnosis not present

## 2022-04-14 DIAGNOSIS — Z170 Estrogen receptor positive status [ER+]: Secondary | ICD-10-CM

## 2022-04-14 MED ORDER — LETROZOLE 2.5 MG PO TABS: 2.5000 mg | ORAL_TABLET | Freq: Every day | ORAL | 3 refills | Status: DC

## 2022-04-14 NOTE — Telephone Encounter (Signed)
Patient had blood drawn for [facility] genetic testing today. Order placed in [facility] portal.

## 2022-04-14 NOTE — Progress Notes (Unsigned)
Accompanied patient and daughter to initial medical oncology appointment.   Reviewed Breast Cancer treatment handbook.   Care plan summary given to patient.   Reviewed outreach programs and cancer center services.

## 2022-04-14 NOTE — Progress Notes (Signed)
Patient has no concerns at the moment. 

## 2022-04-14 NOTE — Progress Notes (Signed)
Hematology/Oncology Consult note [facility] [phone] [phone]  Patient Care Team: Steele Sizer, MD as PCP - General (Family Medicine) Eulogio Bear, MD as Consulting Physician (Ophthalmology) Lonia Farber, MD as Consulting Physician (Internal Medicine) Daiva Huge, RN as Oncology Nurse Navigator   Name of the patient: [name]  [id number]  1940-11-29    Reason for referral-new diagnosis of breast cancer   Referring physician-Dr. Ancil Boozer  Date of visit: 04/14/22   History of presenting illness- Patient is a 82 year old female who underwent a bilateral diagnostic mammogram for palpable mass in the left breast in January 2024.  Mammogram showed hypoechoic mass measuring 3.1 x 1.9 x 3.4 cm in the left retroareolar breast at 2 o'clock position with invasion into the dermis and retroareolar breast.  2 enlarged left axillary lymph nodes.  No mammographic evidence of malignancy in the right breast.  Patient had a left breast biopsy as well as biopsy of the left axillary lymph which showed invasive mammary carcinoma grade 310 mm in the primary breast specimen and 7 mm in the lymph node.  Tumor was ER greater than 90% positive, PR 60% positive and HER2 negative.  Patient is here with her daughter today who is her healthcare power of attorney.  Patient has advanced dementia and is dependent on her family members for ADLs including dressing and ambulation to some extent.  She is incontinent.  She has not had any recent infections or hospitalizations.  Family history significant for ovarian cancer in her maternal aunt.  ECOG PS- 3  Pain scale-unable to assess   Review of systems- Review of Systems  Unable to perform ROS: Dementia    Allergies  Allergen Reactions   Colesevelam Hcl     scotomas   Daucus [place]    Penicillins Itching    Patient Active Problem List   Diagnosis Date Noted   Mild protein-calorie  malnutrition ([place]) 07/29/2021   Stage 3a chronic kidney disease ([place]) 07/29/2021   Gait instability 07/29/2021   Type 1 diabetes mellitus with hyperglycemia, with long-term current use of insulin ([place]) 04/28/2018   Atherosclerosis of aorta ([place]) 04/01/2016   Aortic sclerosis 09/19/2014   Hypertension associated with diabetes ([place]) 09/19/2014   Calculus of gallbladder 09/19/2014   Carpal tunnel syndrome 09/19/2014   Detached retina 09/19/2014   Type 1 diabetes mellitus with microalbuminuria ([place]) 09/19/2014   Dyslipidemia 09/19/2014   Hypophosphatemia 09/19/2014   Mild cognitive impairment with memory loss 09/19/2014   Microalbuminuria 09/19/2014   OP (osteoporosis) 09/19/2014   Menopause 09/19/2014   Ptosis of eyelid 09/19/2014   Hypoglycemia associated with diabetes ([place]) 10/24/2013   Urge incontinence 08/01/2013   Hyperlipidemia due to type 1 diabetes mellitus ([place]) 08/01/2013   Vitamin D deficiency 02/24/2007     Past Medical History:  Diagnosis Date   Dementia ([place])    Diabetes mellitus without complication ([place])    Dysrhythmia    Heart murmur    Hypertension      Past Surgical History:  Procedure Laterality Date   AXILLARY LYMPH NODE BIOPSY Left 04/07/2022   bx done, hydromarker placed, path pending   BREAST BIOPSY Left 04/07/2022   [place] bx mass, ribbon marker, path pending   BREAST BIOPSY Left 04/07/2022   [place] LT BREAST BX W LOC DEV 1ST LESION IMG BX SPEC US GUIDE 04/07/2022 [facility]   BREAST EXCISIONAL BIOPSY Left    neg   BREAST SURGERY  1970   EYE SURGERY  Social History   Socioeconomic History   Marital status: Married    Spouse name: [name]   Number of children: 3   Years of education: Not on file   Highest education level: 12th grade  Occupational History   Occupation: Retired  Tobacco Use   Smoking status: Never   Smokeless tobacco: Never   Tobacco comments:    smoking cessation materials not required  Vaping Use   Vaping Use: Never  used  Substance and Sexual Activity   Alcohol use: No    Alcohol/week: 0.0 standard drinks of alcohol   Drug use: No   Sexual activity: Not Currently  Other Topics Concern   Not on file  Social History Narrative   Not on file   Social Determinants of Health   Financial Resource Strain: Low Risk  (10/07/2017)   Overall Financial Resource Strain (CARDIA)    Difficulty of Paying Living Expenses: Not hard at all  Food Insecurity: No Food Insecurity (01/16/2021)   Hunger Vital Sign    Worried About Running Out of Food in the Last Year: Never true    [place] in the Last Year: Never true  Transportation Needs: No Transportation Needs (01/16/2021)   PRAPARE - Hydrologist (Medical): No    Lack of Transportation (Non-Medical): No  Physical Activity: Inactive (10/07/2017)   Exercise Vital Sign    Days of Exercise per Week: 0 days    Minutes of Exercise per Session: 0 min  Stress: No Stress Concern Present (10/07/2017)   [place]    Feeling of Stress : Not at all  Social Connections: [place] (10/12/2017)   Social Connection and Isolation Panel [NHANES]    Frequency of Communication with Friends and Family: More than three times a week    Frequency of Social Gatherings with Friends and Family: Once a week    Attends Religious Services: More than 4 times per year    Active Member of [employer] or Organizations: Yes    Attends Music therapist: More than 4 times per year    Marital Status: Married  Human resources officer Violence: Not At Risk (10/07/2017)   Humiliation, Afraid, Rape, and Kick questionnaire    Fear of Current or Ex-Partner: No    Emotionally Abused: No    Physically Abused: No    Sexually Abused: No     Family History  Problem Relation Age of Onset   Alzheimer's disease Mother    Diabetes Mother    Arthritis Mother    Diabetes Father    Hypertension Father     Hyperlipidemia Father    Stroke Brother    Multiple sclerosis Daughter      Current Outpatient Medications:    Accu-Chek Softclix Lancets lancets, , Disp: , Rfl:    alendronate (FOSAMAX) 70 MG tablet, Take with a full glass of water on an empty stomach., Disp: 12 tablet, Rfl: 1   amLODipine (NORVASC) 5 MG tablet, Take 1 tablet (5 mg total) by mouth daily., Disp: 90 tablet, Rfl: 1   aspirin EC 81 MG tablet, Take 81 mg by mouth daily., Disp: , Rfl:    atorvastatin (LIPITOR) 80 MG tablet, Take 1 tablet (80 mg total) by mouth daily., Disp: 90 tablet, Rfl: 1   blood glucose meter kit and supplies, , Disp: , Rfl:    Calcium Carbonate-Vitamin D (CALCIUM-VITAMIN D) 600-125 MG-UNIT TABS, Take 1 tablet by  mouth daily., Disp: , Rfl:    [employer] ALCOHOL SWABS PADS, USE 4 TIMES DAILY TO WIPE SKIN BEFORE USING INSULIN, Disp: 400 each, Rfl: 2   donepezil (ARICEPT ODT) 10 MG disintegrating tablet, TAKE 1 TABLET BY MOUTH EVERYDAY AT BEDTIME, Disp: 90 tablet, Rfl: 1   Glucagon (BAQSIMI ONE PACK) 3 MG/DOSE POWD, One spray into nose in case of severe hypoglycemia, Disp: , Rfl:    hydrALAZINE (APRESOLINE) 10 MG tablet, Take 1 tablet (10 mg total) by mouth 3 (three) times daily as needed. bp above 150/90, Disp: 270 tablet, Rfl: 0   insulin aspart (NOVOLOG) 100 UNIT/ML FlexPen, 8 units of novolog with every meal. Add sliding scale as necessary.  Max daily dose 50 units., Disp: , Rfl:    insulin degludec (TRESIBA FLEXTOUCH) 100 UNIT/ML FlexTouch Pen, Inject 22 Units into the skin daily at 12 noon., Disp: , Rfl:    losartan-hydrochlorothiazide (HYZAAR) 100-12.5 MG tablet, Take 1 tablet by mouth daily., Disp: 90 tablet, Rfl: 1   Physical exam:  Vitals:   04/14/22 1451  BP: 131/63  Pulse: 66  Resp: 20  Temp: 97.6 F (36.4 C)  SpO2: 100%  Weight: 134 lb (60.8 kg)   Physical Exam Constitutional:      General: She is not in acute distress. Cardiovascular:     Rate and Rhythm: Normal rate and regular rhythm.      Heart sounds: Normal heart sounds.  Pulmonary:     Effort: Pulmonary effort is normal.     Breath sounds: Normal breath sounds.  Abdominal:     General: Bowel sounds are normal.     Palpations: Abdomen is soft.  Musculoskeletal:     Cervical back: Normal range of motion.  Skin:    General: Skin is warm and dry.  Neurological:     Mental Status: She is alert.   Breast exam: Limited as patient was not made to sit on the examination table due to her dementia.  There is a palpable 3 cm mass in the retroareolar region of the left breast.  No evidence of skin ulceration.  Induration at the site of lymph node biopsy and no distinct lymph nodes palpable.  No palpable masses in the right breast       Latest Ref Rng & Units 01/27/2022    1:52 PM  CMP  Glucose 65 - 99 mg/dL 171   BUN 7 - 25 mg/dL 14   Creatinine 0.60 - 1.00 mg/dL 0.71   Sodium 135 - 146 mmol/L 142   Potassium 3.5 - 5.3 mmol/L 3.8   Chloride 98 - 110 mmol/L 103   CO2 20 - 32 mmol/L 32   Calcium 8.6 - 10.4 mg/dL 9.6   Total Protein 6.1 - 8.1 g/dL 7.3   Total Bilirubin 0.2 - 1.2 mg/dL 0.7   AST 10 - 35 U/L 24   ALT 6 - 29 U/L 40       Latest Ref Rng & Units 01/27/2022    1:52 PM  CBC  WBC 3.8 - 10.8 Thousand/uL 6.7   Hemoglobin 11.7 - 15.5 g/dL 12.9   Hematocrit 35.0 - 45.0 % 38.6   Platelets 140 - 400 Thousand/uL 253     No images are attached to the encounter.  [place] LT BREAST BX W LOC DEV 1ST LESION IMG BX SPEC US GUIDE  Addendum Date: 04/13/2022   ADDENDUM REPORT: 04/13/2022 07:43 ADDENDUM: PATHOLOGY revealed: Site A. BREAST MASS, LEFT RETROAREOLAR 2:00 ULTRASOUND-GUIDED BIOPSY: - INVASIVE  MAMMARY CARCINOMA, NO SPECIAL TYPE. 10 mm in this sample. Grade 3. Ductal carcinoma in situ: Present, high-grade with comedonecrosis. Pathology results are CONCORDANT with imaging findings, per Dr. Dorise Bullion. PATHOLOGY revealed: Site B. LYMPH NODE, LEFT AXILLA; ULTRASOUND-GUIDED BIOPSY: - METASTATIC MAMMARY CARCINOMA,  FOCUS MEASURING 7 MM. Pathology results are CONCORDANT with imaging findings, per Dr. Dorise Bullion. Pathology results and recommendations below were discussed with patient and daughter [name]) by telephone on 04/08/2022. Patient reported biopsy site within normal limits with slight tenderness at the site. Post biopsy care instructions were reviewed, questions were answered and my direct phone number was provided to patient. Patient was instructed to call [facility] if any concerns or questions arise related to the biopsy. RECOMMENDATIONS: 1. Surgical and oncological consultation. Request for surgical and oncological consultation relayed to Casper Harrison RN at [facility] by Electa Sniff RN on 04/08/2022. Pathology results reported by Electa Sniff RN on 04/10/2022. Electronically Signed   By: Dorise Bullion III M.D.   On: 04/13/2022 07:43   Result Date: 04/13/2022 CLINICAL DATA:  Ultrasound-guided biopsy of a 2 o'clock left breast and an abnormal left axillary lymph. EXAM: ULTRASOUND GUIDED LEFT BREAST CORE NEEDLE BIOPSY COMPARISON:  Previous exam(s). PROCEDURE: I met with the patient and we discussed the procedure of ultrasound-guided biopsy, including benefits and alternatives. We discussed the high likelihood of a successful procedure. We discussed the risks of the procedure, including infection, bleeding, tissue injury, clip migration, and inadequate sampling. Informed written consent was given. The usual time-out protocol was performed immediately prior to the procedure. Lesion quadrant: 2 o'clock left breast Using sterile technique and 1% Lidocaine as local anesthetic, under direct ultrasound visualization, a 12 gauge spring-loaded device was used to perform biopsy of a 2 o'clock left breast mass using a lateral approach. At the conclusion of the procedure a tissue marker clip was deployed into the biopsy cavity. Follow up 2 view mammogram was performed and dictated separately.  Lesion quadrant: Left axilla Using sterile technique and 1% Lidocaine as local anesthetic, under direct ultrasound visualization, a 14 gauge spring-loaded device was used to perform biopsy of a left axillary lymph node using a lateral approach. At the conclusion of the procedure a HydroMARK shaped tissue marker clip was deployed into the biopsy cavity. Follow up 2 view mammogram was performed and dictated separately. IMPRESSION: Ultrasound guided biopsy of a 2 o'clock left breast mass and a left axillary lymph. No apparent complications. Electronically Signed: By: Dorise Bullion III M.D. On: 04/07/2022 13:48   [place] AXILLARY NODE CORE BIOPSY LEFT  Addendum Date: 04/13/2022   ADDENDUM REPORT: 04/13/2022 07:43 ADDENDUM: PATHOLOGY revealed: Site A. BREAST MASS, LEFT RETROAREOLAR 2:00 ULTRASOUND-GUIDED BIOPSY: - INVASIVE MAMMARY CARCINOMA, NO SPECIAL TYPE. 10 mm in this sample. Grade 3. Ductal carcinoma in situ: Present, high-grade with comedonecrosis. Pathology results are CONCORDANT with imaging findings, per Dr. Dorise Bullion. PATHOLOGY revealed: Site B. LYMPH NODE, LEFT AXILLA; ULTRASOUND-GUIDED BIOPSY: - METASTATIC MAMMARY CARCINOMA, FOCUS MEASURING 7 MM. Pathology results are CONCORDANT with imaging findings, per Dr. Dorise Bullion. Pathology results and recommendations below were discussed with patient and daughter Keryn Nessler) by telephone on 04/08/2022. Patient reported biopsy site within normal limits with slight tenderness at the site. Post biopsy care instructions were reviewed, questions were answered and my direct phone number was provided to patient. Patient was instructed to call [facility] if any concerns or questions arise related to the biopsy. RECOMMENDATIONS: 1. Surgical and oncological consultation. Request  for surgical and oncological consultation relayed to Casper Harrison RN at [facility] by Electa Sniff RN on 04/08/2022. Pathology results reported by Electa Sniff RN on  04/10/2022. Electronically Signed   By: Dorise Bullion III M.D.   On: 04/13/2022 07:43   Result Date: 04/13/2022 CLINICAL DATA:  Ultrasound-guided biopsy of a 2 o'clock left breast and an abnormal left axillary lymph. EXAM: ULTRASOUND GUIDED LEFT BREAST CORE NEEDLE BIOPSY COMPARISON:  Previous exam(s). PROCEDURE: I met with the patient and we discussed the procedure of ultrasound-guided biopsy, including benefits and alternatives. We discussed the high likelihood of a successful procedure. We discussed the risks of the procedure, including infection, bleeding, tissue injury, clip migration, and inadequate sampling. Informed written consent was given. The usual time-out protocol was performed immediately prior to the procedure. Lesion quadrant: 2 o'clock left breast Using sterile technique and 1% Lidocaine as local anesthetic, under direct ultrasound visualization, a 12 gauge spring-loaded device was used to perform biopsy of a 2 o'clock left breast mass using a lateral approach. At the conclusion of the procedure a tissue marker clip was deployed into the biopsy cavity. Follow up 2 view mammogram was performed and dictated separately. Lesion quadrant: Left axilla Using sterile technique and 1% Lidocaine as local anesthetic, under direct ultrasound visualization, a 14 gauge spring-loaded device was used to perform biopsy of a left axillary lymph node using a lateral approach. At the conclusion of the procedure a HydroMARK shaped tissue marker clip was deployed into the biopsy cavity. Follow up 2 view mammogram was performed and dictated separately. IMPRESSION: Ultrasound guided biopsy of a 2 o'clock left breast mass and a left axillary lymph. No apparent complications. Electronically Signed: By: Dorise Bullion III M.D. On: 04/07/2022 13:48   MM CLIP PLACEMENT LEFT  Result Date: 04/07/2022 CLINICAL DATA:  Evaluate biopsy marker EXAM: 3D DIAGNOSTIC LEFT MAMMOGRAM POST ULTRASOUND BIOPSY COMPARISON:  Previous  exam(s). FINDINGS: 3D Mammographic images were obtained following ultrasound guided biopsy of a left breast mass. The ribbon shaped clip is within the biopsied left breast mass IMPRESSION: Appropriate positioning of the ribbon shaped biopsy marking clip at the site of biopsy in the location. The [facility] placed into the left axillary node cannot be pulled into view. Final Assessment: Post Procedure Mammograms for Marker Placement Electronically Signed   By: Dorise Bullion III M.D.   On: 04/07/2022 14:04  MM DIAG BREAST TOMO BILATERAL  Result Date: 03/30/2022 CLINICAL DATA:  Palpable mass in the LEFT breast. Patient has dementia. EXAM: DIGITAL DIAGNOSTIC BILATERAL MAMMOGRAM WITH TOMOSYNTHESIS; ULTRASOUND LEFT BREAST LIMITED TECHNIQUE: Bilateral digital diagnostic mammography and breast tomosynthesis was performed.; Targeted ultrasound examination of the left breast was performed. Best images possible per technologist communication. COMPARISON:  Previous exam from 2018 ACR Breast Density Category c: The breast tissue is heterogeneously dense, which may obscure small masses. FINDINGS: Spot compression tomosynthesis views were obtained of the site of palpable concern in the LEFT breast. There is subjacent focal asymmetry with overlying skin thickening and several internal pleomorphic calcifications. No suspicious mass, distortion, or microcalcifications are identified to suggest presence of malignancy in the RIGHT breast. On physical exam, there is a hard mass with focal overlying discoloration in the LEFT upper retroareolar breast Targeted ultrasound was performed of the site of palpable concern. In the LEFT retroareolar breast at 2 o'clock, there is an oval hypoechoic mass with irregular borders. It measures 31 x 19 by 34 mm. There is likely invasion into the dermis/retroareolar breast. It  contains internal echogenic foci consistent with calcifications noted mammographically. Targeted ultrasound was performed of  the LEFT axilla. There are 2 enlarged LEFT axillary lymph nodes. One deep lymph node immediately adjacent to a vessel does not demonstrate discernible echogenic hilum. The more superficial LEFT axillary lymph node demonstrates focal cortical thickening of approximately 8 mm. IMPRESSION: 1. There is a 34 mm mass at the site of palpable concern in the LEFT retroareolar breast which is concerning for malignancy. There is sonographic and mammographic concern for overlying dermal/areolar involvement. Recommend ultrasound-guided biopsy for definitive characterization. 2. There are 2 enlarged LEFT axillary lymph nodes. Recommend attempt at ultrasound-guided biopsy for definitive characterization per patient tolerance. 3. No mammographic evidence of malignancy in the RIGHT breast. RECOMMENDATION: LEFT breast ultrasound-guided biopsy x1 LEFT axillary ultrasound-guided biopsy x1 I have discussed the findings and recommendations with the patient and patient's daughter. Patient has dementia. The biopsy procedure was discussed with the patient and patient's daughter and questions were answered. Patient and patient's daughter expressed their understanding of the biopsy recommendation. Patient will be scheduled for biopsy at her earliest convenience by the schedulers. Ordering provider will be notified. If applicable, a reminder letter will be sent to the patient regarding the next appointment. BI-RADS CATEGORY  5: Highly suggestive of malignancy. Electronically Signed   By: Valentino Saxon M.D.   On: 03/30/2022 15:57  US BREAST LTD UNI LEFT INC AXILLA  Result Date: 03/30/2022 CLINICAL DATA:  Palpable mass in the LEFT breast. Patient has dementia. EXAM: DIGITAL DIAGNOSTIC BILATERAL MAMMOGRAM WITH TOMOSYNTHESIS; ULTRASOUND LEFT BREAST LIMITED TECHNIQUE: Bilateral digital diagnostic mammography and breast tomosynthesis was performed.; Targeted ultrasound examination of the left breast was performed. Best images possible per  technologist communication. COMPARISON:  Previous exam from 2018 ACR Breast Density Category c: The breast tissue is heterogeneously dense, which may obscure small masses. FINDINGS: Spot compression tomosynthesis views were obtained of the site of palpable concern in the LEFT breast. There is subjacent focal asymmetry with overlying skin thickening and several internal pleomorphic calcifications. No suspicious mass, distortion, or microcalcifications are identified to suggest presence of malignancy in the RIGHT breast. On physical exam, there is a hard mass with focal overlying discoloration in the LEFT upper retroareolar breast Targeted ultrasound was performed of the site of palpable concern. In the LEFT retroareolar breast at 2 o'clock, there is an oval hypoechoic mass with irregular borders. It measures 31 x 19 by 34 mm. There is likely invasion into the dermis/retroareolar breast. It contains internal echogenic foci consistent with calcifications noted mammographically. Targeted ultrasound was performed of the LEFT axilla. There are 2 enlarged LEFT axillary lymph nodes. One deep lymph node immediately adjacent to a vessel does not demonstrate discernible echogenic hilum. The more superficial LEFT axillary lymph node demonstrates focal cortical thickening of approximately 8 mm. IMPRESSION: 1. There is a 34 mm mass at the site of palpable concern in the LEFT retroareolar breast which is concerning for malignancy. There is sonographic and mammographic concern for overlying dermal/areolar involvement. Recommend ultrasound-guided biopsy for definitive characterization. 2. There are 2 enlarged LEFT axillary lymph nodes. Recommend attempt at ultrasound-guided biopsy for definitive characterization per patient tolerance. 3. No mammographic evidence of malignancy in the RIGHT breast. RECOMMENDATION: LEFT breast ultrasound-guided biopsy x1 LEFT axillary ultrasound-guided biopsy x1 I have discussed the findings and  recommendations with the patient and patient's daughter. Patient has dementia. The biopsy procedure was discussed with the patient and patient's daughter and questions were answered. Patient and patient's daughter expressed  their understanding of the biopsy recommendation. Patient will be scheduled for biopsy at her earliest convenience by the schedulers. Ordering provider will be notified. If applicable, a reminder letter will be sent to the patient regarding the next appointment. BI-RADS CATEGORY  5: Highly suggestive of malignancy. Electronically Signed   By: Valentino Saxon M.D.   On: 03/30/2022 15:57   Assessment and plan- Patient is a 82 y.o. female with newly diagnosed clinical prognostic stage IIb invasive mammary carcinoma of the left breast cT2 N1 MX ER 90% positive, PR 60% positive and HER2 negative here to discuss further management  I have reviewed mammogram and biopsy findings and discussed the results with the patient's daughter in detail.  Patient is unable to understand her medical condition due to dementia.  She was found to have a 3 cm retroareolar left breast mass as well as 2 concerning left axillary lymph nodes.  Both the breast mass and axillary lymph node is positive for breast cancer ER and PR strongly positive and HER2 negative.  Patient likely needs a left mastectomy at this time.  I would also recommend getting either a PET scan or CT chest abdomen pelvis with contrast and a bone scan to complete her staging workup.    Given her age and advanced dementia I do not recommend any neoadjuvant chemotherapy or adjuvant chemotherapy for her.  She will be seen by Dr. Peyton Najjar next week to determine eligibility for surgery and if she can undergo lumpectomy and axillary lymph node dissection versus mastectomy.  If she does undergo surgery ideally she would benefit from adjuvant radiation therapy given that she had biopsy-proven positive lymph node but again given her underlying dementia it  is unclear if she can go through it daily for 5 weeks.  I am starting the patient on neoadjuvant letrozole at this time given that she has a strongly ER/PR positive tumor while awaiting surgical planning.  If it is ultimately decided that she is not a surgical candidate patient can continue to stay on endocrine therapy.  Discussed risks and benefits of letrozole including all but not limited to hot flashes, mood swings, arthralgias and worsening bone health.  I will discuss getting a bone density scan at my next visit  I will meet the patient and daughter for about visit after staging scans are completed.  I am sending the labs off for genetic testing along with baseline tumor marker CA 27-29 and CA 15-3 today.   Cancer Staging  Malignant neoplasm of areola of left breast in female, estrogen receptor positive ([place]) Staging form: Breast, AJCC 8th Edition - Clinical stage from 04/14/2022: Stage IIB (cT2, cN1(f), cM0, G3, ER+, PR+, HER2-) - Signed by Sindy Guadeloupe, MD on 04/14/2022 Stage prefix: Initial diagnosis Method of lymph node assessment: Core biopsy Histologic grading system: 3 grade system     Thank you for this kind referral and the opportunity to participate in the care of this patient   Visit Diagnosis 1. 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 Goals of care, counseling/discussion     Dr. Randa Evens, MD, MPH [facility] [phone] 04/14/2022 3:41 PM

## 2022-04-15 LAB — CANCER ANTIGEN 27.29: CA 27.29: 63.2 U/mL — ABNORMAL HIGH (ref 0.0–38.6)

## 2022-04-16 DIAGNOSIS — Z0100 Encounter for examination of eyes and vision without abnormal findings: Secondary | ICD-10-CM | POA: Diagnosis not present

## 2022-04-16 DIAGNOSIS — Z961 Presence of intraocular lens: Secondary | ICD-10-CM | POA: Diagnosis not present

## 2022-04-16 DIAGNOSIS — E113599 Type 2 diabetes mellitus with proliferative diabetic retinopathy without macular edema, unspecified eye: Secondary | ICD-10-CM | POA: Diagnosis not present

## 2022-04-16 DIAGNOSIS — H04123 Dry eye syndrome of bilateral lacrimal glands: Secondary | ICD-10-CM | POA: Diagnosis not present

## 2022-04-16 LAB — CANCER ANTIGEN 15-3: CA 15-3: 57.3 U/mL — ABNORMAL HIGH (ref 0.0–25.0)

## 2022-04-21 DIAGNOSIS — Z170 Estrogen receptor positive status [ER+]: Secondary | ICD-10-CM | POA: Diagnosis not present

## 2022-04-21 DIAGNOSIS — C50012 Malignant neoplasm of nipple and areola, left female breast: Secondary | ICD-10-CM | POA: Diagnosis not present

## 2022-04-28 ENCOUNTER — Ambulatory Visit
Admission: RE | Admit: 2022-04-28 | Discharge: 2022-04-28 | Disposition: A | Discharge status: Home / Self Care | Payer: Medicare HMO | Source: Ambulatory Visit | Attending: Oncology | Admitting: Oncology

## 2022-04-28 ENCOUNTER — Other Ambulatory Visit: Payer: Self-pay | Admitting: Family Medicine

## 2022-04-28 DIAGNOSIS — Z170 Estrogen receptor positive status [ER+]: Secondary | ICD-10-CM | POA: Insufficient documentation

## 2022-04-28 DIAGNOSIS — D259 Leiomyoma of uterus, unspecified: Secondary | ICD-10-CM | POA: Diagnosis not present

## 2022-04-28 DIAGNOSIS — G9389 Other specified disorders of brain: Secondary | ICD-10-CM | POA: Insufficient documentation

## 2022-04-28 DIAGNOSIS — Z7189 Other specified counseling: Secondary | ICD-10-CM | POA: Diagnosis not present

## 2022-04-28 DIAGNOSIS — I2510 Atherosclerotic heart disease of native coronary artery without angina pectoris: Secondary | ICD-10-CM | POA: Diagnosis not present

## 2022-04-28 DIAGNOSIS — K8020 Calculus of gallbladder without cholecystitis without obstruction: Secondary | ICD-10-CM | POA: Diagnosis not present

## 2022-04-28 DIAGNOSIS — R590 Localized enlarged lymph nodes: Secondary | ICD-10-CM | POA: Diagnosis not present

## 2022-04-28 DIAGNOSIS — C50012 Malignant neoplasm of nipple and areola, left female breast: Secondary | ICD-10-CM | POA: Insufficient documentation

## 2022-04-28 DIAGNOSIS — E1159 Type 2 diabetes mellitus with other circulatory complications: Secondary | ICD-10-CM

## 2022-04-28 DIAGNOSIS — I700 Atherosclerosis of aorta: Secondary | ICD-10-CM | POA: Insufficient documentation

## 2022-04-28 DIAGNOSIS — C50912 Malignant neoplasm of unspecified site of left female breast: Secondary | ICD-10-CM | POA: Diagnosis not present

## 2022-04-28 DIAGNOSIS — I517 Cardiomegaly: Secondary | ICD-10-CM | POA: Insufficient documentation

## 2022-04-28 DIAGNOSIS — I152 Hypertension secondary to endocrine disorders: Secondary | ICD-10-CM

## 2022-04-28 LAB — GLUCOSE, CAPILLARY: Glucose-Capillary: 171 mg/dL — ABNORMAL HIGH (ref 70–99)

## 2022-04-28 MED ORDER — FLUDEOXYGLUCOSE F - 18 (FDG) INJECTION
6.9000 | Freq: Once | INTRAVENOUS | Status: AC
  Administered 2022-04-28: 7.3 via INTRAVENOUS

## 2022-05-01 ENCOUNTER — Telehealth: Payer: Self-pay | Admitting: Licensed Clinical Social Worker

## 2022-05-01 ENCOUNTER — Encounter: Payer: Self-pay | Admitting: Licensed Clinical Social Worker

## 2022-05-01 DIAGNOSIS — Z1379 Encounter for other screening for genetic and chromosomal anomalies: Secondary | ICD-10-CM | POA: Insufficient documentation

## 2022-05-01 NOTE — Telephone Encounter (Signed)
I contacted Ms. Guild's daughter, Arrie Aran, to discuss her genetic testing results. No pathogenic variants were identified in the 48 genes analyzed.    The test report has been scanned into EPIC and is located under the Molecular Pathology section of the Results Review tab.  A portion of the result report is included below for reference. We discussed the uncertain result in SDHD and that we treat this as normal as over [id number] of uncertain results are reclassified to "benign."      Faith Rogue, MS, [email or web address] Phone: [phone]

## 2022-05-04 ENCOUNTER — Encounter: Payer: Self-pay | Admitting: Oncology

## 2022-05-04 ENCOUNTER — Inpatient Hospital Stay (HOSPITAL_BASED_OUTPATIENT_CLINIC_OR_DEPARTMENT_OTHER): Payer: Medicare HMO | Admitting: Oncology

## 2022-05-04 DIAGNOSIS — C50012 Malignant neoplasm of nipple and areola, left female breast: Secondary | ICD-10-CM

## 2022-05-04 DIAGNOSIS — Z170 Estrogen receptor positive status [ER+]: Secondary | ICD-10-CM | POA: Diagnosis not present

## 2022-05-04 NOTE — Progress Notes (Signed)
I connected with [name] on 05/04/22 at  3:00 PM EST by video enabled telemedicine visit and verified that I am speaking with the correct person using two identifiers.   I discussed the limitations, risks, security and privacy concerns of performing an evaluation and management service by telemedicine and the availability of in-person appointments. I also discussed with the patient that there may be a patient responsible charge related to this service. The patient expressed understanding and agreed to proceed.  Other persons participating in the visit and their role in the encounter: Patient's daughter and husband  Patient's location:  home Provider's location:  work  Risk analyst Complaint: Discuss PET CT scan results and further management  History of present illness:  Patient is a 82 year old female who underwent a bilateral diagnostic mammogram for palpable mass in the left breast in January 2024.  Mammogram showed hypoechoic mass measuring 3.1 x 1.9 x 3.4 cm in the left retroareolar breast at 2 o'clock position with invasion into the dermis and retroareolar breast.  2 enlarged left axillary lymph nodes.  No mammographic evidence of malignancy in the right breast.  Patient had a left breast biopsy as well as biopsy of the left axillary lymph which showed invasive mammary carcinoma grade 310 mm in the primary breast specimen and 7 mm in the lymph node.  Tumor was ER greater than 90% positive, PR 60% positive and HER2 negative.   Patient has advanced dementia and her daughter is her healthcare power of attorney.  Patient has advanced dementia and is dependent on her family members for ADLs including dressing and ambulation to some extent.  She is incontinent.  She has not had any recent infections or hospitalizations.    Interval history no acute issues since the last visit   Review of Systems  Unable to perform ROS: Mental acuity    Allergies  Allergen Reactions   Colesevelam Hcl      scotomas   Daucus [place]ota    Penicillins Itching    Past Medical History:  Diagnosis Date   Dementia ([place])    Diabetes mellitus without complication ([place])    Dysrhythmia    Heart murmur    Hypertension     Past Surgical History:  Procedure Laterality Date   AXILLARY LYMPH NODE BIOPSY Left 04/07/2022   bx done, hydromarker placed, path pending   BREAST BIOPSY Left 04/07/2022   [place] bx mass, ribbon marker, path pending   BREAST BIOPSY Left 04/07/2022   [place] LT BREAST BX W LOC DEV 1ST LESION IMG BX [place] US GUIDE 04/07/2022 [facility]   BREAST EXCISIONAL BIOPSY Left    neg   BREAST SURGERY  1970   EYE SURGERY      Social History   Socioeconomic History   Marital status: Married    Spouse name: [name]   Number of children: 3   Years of education: Not on file   Highest education level: 12th grade  Occupational History   Occupation: Retired  Tobacco Use   Smoking status: Never   Smokeless tobacco: Never   Tobacco comments:    smoking cessation materials not required  Vaping Use   Vaping Use: Never used  Substance and Sexual Activity   Alcohol use: No    Alcohol/week: 0.0 standard drinks of alcohol   Drug use: No   Sexual activity: Not Currently  Other Topics Concern   Not on file  Social History Narrative   Not on file   Social Determinants of Health  Financial Resource Strain: Low Risk  (10/07/2017)   Overall Financial Resource Strain (CARDIA)    Difficulty of Paying Living Expenses: Not hard at all  Food Insecurity: No Food Insecurity (04/14/2022)   Hunger Vital Sign    Worried About Running Out of Food in the Last Year: Never true    Ran Out of Food in the Last Year: Never true  Transportation Needs: No Transportation Needs (01/16/2021)   PRAPARE - Hydrologist (Medical): No    Lack of Transportation (Non-Medical): No  Physical Activity: Inactive (10/07/2017)   Exercise Vital Sign    Days of Exercise per Week: 0 days    Minutes  of Exercise per Session: 0 min  Stress: No Stress Concern Present (10/07/2017)   [place]    Feeling of Stress : Not at all  Social Connections: [place] (10/12/2017)   Social Connection and Isolation Panel [NHANES]    Frequency of Communication with Friends and Family: More than three times a week    Frequency of Social Gatherings with Friends and Family: Once a week    Attends Religious Services: More than 4 times per year    Active Member of [employer] or Organizations: Yes    Attends Music therapist: More than 4 times per year    Marital Status: Married  Human resources officer Violence: Not At Risk (04/14/2022)   Humiliation, Afraid, Rape, and Kick questionnaire    Fear of Current or Ex-Partner: No    Emotionally Abused: No    Physically Abused: No    Sexually Abused: No    Family History  Problem Relation Age of Onset   Alzheimer's disease Mother    Diabetes Mother    Arthritis Mother    Diabetes Father    Hypertension Father    Hyperlipidemia Father    Stroke Brother    Multiple sclerosis Daughter      Current Outpatient Medications:    Accu-Chek Softclix Lancets lancets, , Disp: , Rfl:    alendronate (FOSAMAX) 70 MG tablet, Take with a full glass of water on an empty stomach., Disp: 12 tablet, Rfl: 1   amLODipine (NORVASC) 5 MG tablet, Take 1 tablet (5 mg total) by mouth daily., Disp: 90 tablet, Rfl: 1   aspirin EC 81 MG tablet, Take 81 mg by mouth daily., Disp: , Rfl:    atorvastatin (LIPITOR) 80 MG tablet, Take 1 tablet (80 mg total) by mouth daily., Disp: 90 tablet, Rfl: 1   blood glucose meter kit and supplies, , Disp: , Rfl:    Calcium Carb-Cholecalciferol 600-10 MG-MCG TABS, Take 2 tablets by mouth daily., Disp: , Rfl:    Calcium Carbonate-Vitamin D (CALCIUM-VITAMIN D) 600-125 MG-UNIT TABS, Take 1 tablet by mouth daily., Disp: , Rfl:    [employer] ALCOHOL SWABS PADS, USE 4 TIMES DAILY TO WIPE  SKIN BEFORE USING INSULIN, Disp: 400 each, Rfl: 2   donepezil (ARICEPT ODT) 10 MG disintegrating tablet, TAKE 1 TABLET BY MOUTH EVERYDAY AT BEDTIME, Disp: 90 tablet, Rfl: 1   Glucagon (BAQSIMI ONE PACK) 3 MG/DOSE POWD, One spray into nose in case of severe hypoglycemia, Disp: , Rfl:    hydrALAZINE (APRESOLINE) 10 MG tablet, TAKE 1 TABLET (10 MG TOTAL) BY MOUTH 3 (THREE) TIMES DAILY AS NEEDED. BP ABOVE 150/90, Disp: 270 tablet, Rfl: 0   insulin aspart (NOVOLOG) 100 UNIT/ML FlexPen, 8 units of novolog with every meal. Add sliding scale as  necessary.  Max daily dose 50 units., Disp: , Rfl:    insulin degludec (TRESIBA FLEXTOUCH) 100 UNIT/ML FlexTouch Pen, Inject 22 Units into the skin daily at 12 noon., Disp: , Rfl:    letrozole (FEMARA) 2.5 MG tablet, Take 1 tablet (2.5 mg total) by mouth daily., Disp: 30 tablet, Rfl: 3   losartan-hydrochlorothiazide (HYZAAR) 100-12.5 MG tablet, Take 1 tablet by mouth daily., Disp: 90 tablet, Rfl: 1  NM PET Image Initial (PI) Skull Base To Thigh  Result Date: 04/28/2022 CLINICAL DATA:  Initial Treatment strategy for left-sided breast cancer with positive lymph node. Dementia. EXAM: NUCLEAR MEDICINE PET SKULL BASE TO THIGH TECHNIQUE: 7.3 mCi F-18 FDG was injected intravenously. Full-ring PET imaging was performed from the skull base to thigh after the radiotracer. CT data was obtained and used for attenuation correction and anatomic localization. Fasting blood glucose: 171 mg/dl COMPARISON:  01/24/2012 abdominopelvic CT. FINDINGS: Mediastinal blood pool activity: SUV max 2.2 Liver activity: SUV max NA NECK: No cervical nodal hypermetabolism. A focus of right-sided thyroid hypermetabolism may correspond to a subtle partially calcified 11 mm nodule. Example a S.U.V. max of 3.7 on 48/2. Incidental CT findings: No cervical adenopathy. Incompletely imaged marked cerebral atrophy. Ventriculomegaly with prominence of the occipital horns of the lateral ventricles. CHEST: Left  axillary nodes demonstrate low-level hypermetabolism. Example 1.0 cm and a S.U.V. max of 1.7 on 66/2. No pulmonary parenchymal hypermetabolism. Incidental CT findings: Cardiomegaly. Aortic and coronary artery calcification. Pulmonary artery enlargement, outflow tract 3.1 cm. ABDOMEN/PELVIS: No abdominopelvic parenchymal or nodal hypermetabolism. Incidental CT findings: Innumerable gallstones without acute cholecystitis. No biliary duct dilatation. Abdominal aortic atherosclerosis. Normal adrenal glands. Fibroid uterus. SKELETON: Subareolar left breast primary measures on the order of 3.2 x 2.3 cm and a S.U.V. max of 4.5 on 92/2. No abnormal marrow activity. Incidental CT findings: Osteopenia. IMPRESSION: 1. Left breast primary with ipsilateral axillary nodal metastasis. 2. Ventriculomegaly which is incompletely imaged but most likely related to cerebral atrophy. If there is a clinical concern of normal pressure hydrocephalus, dedicated brain MR should be considered. 3. Right thyroid hypermetabolism, possibly corresponding to a partially calcified 11 mm nodule. This is of questionable clinical significance given patient age and comorbidities. If further evaluation is desired, thyroid ultrasound could be performed. 4. Incidental findings, including: Coronary artery atherosclerosis. Aortic Atherosclerosis (ICD10-I70.0). Cholelithiasis. Fibroid uterus. Electronically Signed   By: Abigail Miyamoto M.D.   On: 04/28/2022 13:13   [place] LT BREAST BX W LOC DEV 1ST LESION IMG BX SPEC US GUIDE  Addendum Date: 04/13/2022   ADDENDUM REPORT: 04/13/2022 07:43 ADDENDUM: PATHOLOGY revealed: Site A. BREAST MASS, LEFT RETROAREOLAR 2:00 ULTRASOUND-GUIDED BIOPSY: - INVASIVE MAMMARY CARCINOMA, NO SPECIAL TYPE. 10 mm in this sample. Grade 3. Ductal carcinoma in situ: Present, high-grade with comedonecrosis. Pathology results are CONCORDANT with imaging findings, per Dr. Dorise Bullion. PATHOLOGY revealed: Site B. LYMPH NODE, LEFT AXILLA;  ULTRASOUND-GUIDED BIOPSY: - METASTATIC MAMMARY CARCINOMA, FOCUS MEASURING 7 MM. Pathology results are CONCORDANT with imaging findings, per Dr. Dorise Bullion. Pathology results and recommendations below were discussed with patient and daughter Aset Kollmeyer) by telephone on 04/08/2022. Patient reported biopsy site within normal limits with slight tenderness at the site. Post biopsy care instructions were reviewed, questions were answered and my direct phone number was provided to patient. Patient was instructed to call [facility] if any concerns or questions arise related to the biopsy. RECOMMENDATIONS: 1. Surgical and oncological consultation. Request for surgical and oncological consultation relayed to Casper Harrison RN at  [place] by Electa Sniff RN on 04/08/2022. Pathology results reported by Electa Sniff RN on 04/10/2022. Electronically Signed   By: Dorise Bullion III M.D.   On: 04/13/2022 07:43   Result Date: 04/13/2022 CLINICAL DATA:  Ultrasound-guided biopsy of a 2 o'clock left breast and an abnormal left axillary lymph. EXAM: ULTRASOUND GUIDED LEFT BREAST CORE NEEDLE BIOPSY COMPARISON:  Previous exam(s). PROCEDURE: I met with the patient and we discussed the procedure of ultrasound-guided biopsy, including benefits and alternatives. We discussed the high likelihood of a successful procedure. We discussed the risks of the procedure, including infection, bleeding, tissue injury, clip migration, and inadequate sampling. Informed written consent was given. The usual time-out protocol was performed immediately prior to the procedure. Lesion quadrant: 2 o'clock left breast Using sterile technique and 1% Lidocaine as local anesthetic, under direct ultrasound visualization, a 12 gauge spring-loaded device was used to perform biopsy of a 2 o'clock left breast mass using a lateral approach. At the conclusion of the procedure a tissue marker clip was deployed into the biopsy cavity. Follow up 2  view mammogram was performed and dictated separately. Lesion quadrant: Left axilla Using sterile technique and 1% Lidocaine as local anesthetic, under direct ultrasound visualization, a 14 gauge spring-loaded device was used to perform biopsy of a left axillary lymph node using a lateral approach. At the conclusion of the procedure a HydroMARK shaped tissue marker clip was deployed into the biopsy cavity. Follow up 2 view mammogram was performed and dictated separately. IMPRESSION: Ultrasound guided biopsy of a 2 o'clock left breast mass and a left axillary lymph. No apparent complications. Electronically Signed: By: Dorise Bullion III M.D. On: 04/07/2022 13:48   [place] AXILLARY NODE CORE BIOPSY LEFT  Addendum Date: 04/13/2022   ADDENDUM REPORT: 04/13/2022 07:43 ADDENDUM: PATHOLOGY revealed: Site A. BREAST MASS, LEFT RETROAREOLAR 2:00 ULTRASOUND-GUIDED BIOPSY: - INVASIVE MAMMARY CARCINOMA, NO SPECIAL TYPE. 10 mm in this sample. Grade 3. Ductal carcinoma in situ: Present, high-grade with comedonecrosis. Pathology results are CONCORDANT with imaging findings, per Dr. Dorise Bullion. PATHOLOGY revealed: Site B. LYMPH NODE, LEFT AXILLA; ULTRASOUND-GUIDED BIOPSY: - METASTATIC MAMMARY CARCINOMA, FOCUS MEASURING 7 MM. Pathology results are CONCORDANT with imaging findings, per Dr. Dorise Bullion. Pathology results and recommendations below were discussed with patient and daughter Meriam Sekelsky) by telephone on 04/08/2022. Patient reported biopsy site within normal limits with slight tenderness at the site. Post biopsy care instructions were reviewed, questions were answered and my direct phone number was provided to patient. Patient was instructed to call [facility] if any concerns or questions arise related to the biopsy. RECOMMENDATIONS: 1. Surgical and oncological consultation. Request for surgical and oncological consultation relayed to Casper Harrison RN at [facility] by Electa Sniff RN on  04/08/2022. Pathology results reported by Electa Sniff RN on 04/10/2022. Electronically Signed   By: Dorise Bullion III M.D.   On: 04/13/2022 07:43   Result Date: 04/13/2022 CLINICAL DATA:  Ultrasound-guided biopsy of a 2 o'clock left breast and an abnormal left axillary lymph. EXAM: ULTRASOUND GUIDED LEFT BREAST CORE NEEDLE BIOPSY COMPARISON:  Previous exam(s). PROCEDURE: I met with the patient and we discussed the procedure of ultrasound-guided biopsy, including benefits and alternatives. We discussed the high likelihood of a successful procedure. We discussed the risks of the procedure, including infection, bleeding, tissue injury, clip migration, and inadequate sampling. Informed written consent was given. The usual time-out protocol was performed immediately prior to the procedure. Lesion quadrant: 2 o'clock left  breast Using sterile technique and 1% Lidocaine as local anesthetic, under direct ultrasound visualization, a 12 gauge spring-loaded device was used to perform biopsy of a 2 o'clock left breast mass using a lateral approach. At the conclusion of the procedure a tissue marker clip was deployed into the biopsy cavity. Follow up 2 view mammogram was performed and dictated separately. Lesion quadrant: Left axilla Using sterile technique and 1% Lidocaine as local anesthetic, under direct ultrasound visualization, a 14 gauge spring-loaded device was used to perform biopsy of a left axillary lymph node using a lateral approach. At the conclusion of the procedure a HydroMARK shaped tissue marker clip was deployed into the biopsy cavity. Follow up 2 view mammogram was performed and dictated separately. IMPRESSION: Ultrasound guided biopsy of a 2 o'clock left breast mass and a left axillary lymph. No apparent complications. Electronically Signed: By: Dorise Bullion III M.D. On: 04/07/2022 13:48   MM CLIP PLACEMENT LEFT  Result Date: 04/07/2022 CLINICAL DATA:  Evaluate biopsy marker EXAM: 3D DIAGNOSTIC LEFT  MAMMOGRAM POST ULTRASOUND BIOPSY COMPARISON:  Previous exam(s). FINDINGS: 3D Mammographic images were obtained following ultrasound guided biopsy of a left breast mass. The ribbon shaped clip is within the biopsied left breast mass IMPRESSION: Appropriate positioning of the ribbon shaped biopsy marking clip at the site of biopsy in the location. [facility] placed into the left axillary node cannot be pulled into view. Final Assessment: Post Procedure Mammograms for Marker Placement Electronically Signed   By: Dorise Bullion III M.D.   On: 04/07/2022 14:04   No images are attached to the encounter.      Latest Ref Rng & Units 01/27/2022    1:52 PM  CMP  Glucose 65 - 99 mg/dL 171   BUN 7 - 25 mg/dL 14   Creatinine 0.60 - 1.00 mg/dL 0.71   Sodium 135 - 146 mmol/L 142   Potassium 3.5 - 5.3 mmol/L 3.8   Chloride 98 - 110 mmol/L 103   CO2 20 - 32 mmol/L 32   Calcium 8.6 - 10.4 mg/dL 9.6   Total Protein 6.1 - 8.1 g/dL 7.3   Total Bilirubin 0.2 - 1.2 mg/dL 0.7   AST 10 - 35 U/L 24   ALT 6 - 29 U/L 40       Latest Ref Rng & Units 01/27/2022    1:52 PM  CBC  WBC 3.8 - 10.8 Thousand/uL 6.7   Hemoglobin 11.7 - 15.5 g/dL 12.9   Hematocrit 35.0 - 45.0 % 38.6   Platelets 140 - 400 Thousand/uL 253      Observation/objective: Appears in no acute distress over video visit today.  Breathing is nonlabored  Assessment and plan: Patient is a 82 year old female with clinical prognostic stage IIb invasive mammary carcinoma of the left breast cT2 N1 MX ER 90% positive, PR 60% positive and HER2 negative.  This is a visit to discuss PET CT scan results and further management  Patient has biopsy-proven left breast cancer with left axillary adenopathy.  I have reviewed PET CT scan imagesModify current out from your computer dependently and discussed findings with the daughter and her husband will does not show any evidence of distant metastatic disease.  She has an upcoming appointment with Dr. Peyton Najjar  to discuss mastectomy and lymph node dissection.  Patient's family is willing to consider adjuvant radiation as she has tolerated all her tests and biopsies well so far.  Family thinks that she will probably be able to lay still through  her radiation treatments.  I would not offer her any adjuvant chemotherapy and she will continue Letrozole at this time which had started during my first visit.  If ultimately it is decided that patient is not going for surgery there would be no role for radiation treatment but she will still continue letrozole alone  Follow-up instructions: I will tentatively see her in 5 weeks time along with radiation oncology to discuss pathology results if she goes through mastectomy  I discussed the assessment and treatment plan with the patient. The patient was provided an opportunity to ask questions and all were answered. The patient agreed with the plan and demonstrated an understanding of the instructions.   The patient was advised to call back or seek an in-person evaluation if the symptoms worsen or if the condition fails to improve as anticipated.  I provided 15 minutes of face-to-face video visit time during this encounter, and > 50% was spent counseling as documented under my assessment & plan.  Visit Diagnosis: 1. 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 Randa Evens, MD, MPH [facility] at [facility] Tel- [phone] 05/04/2022 3:44 PM

## 2022-05-07 ENCOUNTER — Ambulatory Visit: Payer: Medicare HMO

## 2022-05-07 ENCOUNTER — Ambulatory Visit: Payer: Self-pay | Admitting: General Surgery

## 2022-05-07 DIAGNOSIS — C50012 Malignant neoplasm of nipple and areola, left female breast: Secondary | ICD-10-CM | POA: Diagnosis not present

## 2022-05-07 DIAGNOSIS — Z170 Estrogen receptor positive status [ER+]: Secondary | ICD-10-CM | POA: Diagnosis not present

## 2022-05-07 DIAGNOSIS — F03C0 Unspecified dementia, severe, without behavioral disturbance, psychotic disturbance, mood disturbance, and anxiety: Secondary | ICD-10-CM | POA: Diagnosis not present

## 2022-05-07 NOTE — H&P (View-Only) (Signed)
HISTORY OF PRESENT ILLNESS:    [name] is a 82 y.o.female patient who comes for evaluation of discussion of surgical management for breast cancer.  Patient diagnosed with left breast cancer with large palpable mass and clinically positive axillary lymph nodes.  She has advanced dementia to the point to need complete assisting for ADLs.  Her cancer was incidentally found by her family while giving her a bath.  Imaging showed 3.4 cm mass and abnormal left axillary lymph node.  Biopsy showed invasive mammary carcinoma with metastatic disease to the axillary lymph nodes.  She was evaluated by medical oncology and discussion has been going on about further management including surgical intervention versus antiestrogen therapy.  Patient had a PET scan without any evidence of distant metastatic disease.  Patient had a reevaluation by medical oncology and now they are willing to discuss about adjuvant radiation therapy.  As per medical oncology patient not a candidate for chemotherapy due to her medical comorbidities.      PAST MEDICAL HISTORY:  Past Medical History:  Diagnosis Date   Carpal tunnel syndrome    Dyslipidemia    Hypercholesterolemia    Hypertension    Incontinence    Lumbago    Menopause    Mild cognitive impairment with memory loss    Osteoporosis    Type 2 diabetes mellitus (CMS-HCC)    Vitamin D deficiency         PAST SURGICAL HISTORY:   Past Surgical History:  Procedure Laterality Date   BREAST EXCISIONAL BIOPSY     CATARACT EXTRACTION     COLONOSCOPY     MASTECTOMY PARTIAL / LUMPECTOMY           MEDICATIONS:  Outpatient Encounter Medications as of 05/07/2022  Medication Sig Dispense Refill   alendronate (FOSAMAX) 70 MG tablet Take 1 tablet (70 mg total) by mouth once a week 4 tablet 12   amLODIPine (NORVASC) 5 MG tablet Take 5 mg by mouth once daily        atorvastatin (LIPITOR) 80 MG tablet Take 80 mg by mouth every evening.  1   BD INSULIN SYRINGE  ULTRA-FINE 0.3 mL 31 x 5/16" syringe   4   blood glucose diagnostic (ACCU-CHEK AVIVA PLUS TEST STRP) test strip Use 3 times daily dx e10.65 300 strip 2   calcium carbonate-vitamin D3 (CALTRATE 600+D) 600 mg(1,500mg) -400 unit tablet Take 2 tablets by mouth once daily     glucagon (BAQSIMI) 3 mg/actuation nasal spray One spray into nose in case of severe hypoglycemia 3 each 1   hydrALAZINE (APRESOLINE) 10 MG tablet Take 1 tablet by mouth 3 (three) times a day     insulin ASPART (NOVOLOG FLEXPEN U-100 INSULIN) pen injector (concentration 100 units/mL) Inject 10 units twice a day at breakfast and lunch. Inject 8 units at supper. ADD SLIDING SCALE AS NECESSARY. MAX DAILY DOSE 50 UNITS. 15 mL 6   insulin DEGLUDEC (TRESIBA FLEXTOUCH U-100) pen injector (concentration 100 units/mL) INJECT 24 UNITS SUBCUTANEOUSLY ONCE DAILY (Patient taking differently: Inject 22 Units subcutaneously once daily) 15 mL 6   losartan-hydrochlorothiazide (HYZAAR) 100-12.5 mg tablet Take 1 tablet by mouth once daily     ondansetron (ZOFRAN-ODT) 4 MG disintegrating tablet Take 1 tablet by mouth every 8 (eight) hours as needed     pen needle, diabetic (BD NANO 2ND GEN PEN NEEDLE) 32 gauge x 5/32" Ndle USE 1 EACH 4 (FOUR) TIMES DAILY 400 each 15   lancets Use 1 each 3 (three)   times daily E10.65 300 each 2   No facility-administered encounter medications on file as of 05/07/2022.     ALLERGIES:   Carrot extract, Colesevelam hcl, and Penicillins   SOCIAL HISTORY:  Social History   Socioeconomic History   Marital status: Married  Tobacco Use   Smoking status: Never   Smokeless tobacco: Never  Vaping Use   Vaping Use: Never used  Substance and Sexual Activity   Alcohol use: No    Alcohol/week: 0.0 standard drinks of alcohol   Drug use: No   Sexual activity: Defer    Partners: Male    FAMILY HISTORY:  Family History  Problem Relation Age of Onset   Diabetes Mother    Osteoarthritis Mother    Alzheimer's disease  Mother    Stroke Brother    Myocardial Infarction (Heart attack) Brother    High blood pressure (Hypertension) Father    Hyperlipidemia (Elevated cholesterol) Father    Diabetes Father    Multiple sclerosis Daughter      GENERAL REVIEW OF SYSTEMS:   General ROS: negative for - chills, fatigue, fever, weight gain or weight loss Allergy and Immunology ROS: negative for - hives  Hematological and Lymphatic ROS: negative for - bleeding problems or bruising, negative for palpable nodes Endocrine ROS: negative for - heat or cold intolerance, hair changes Respiratory ROS: negative for - cough, shortness of breath or wheezing Cardiovascular ROS: no chest pain or palpitations GI ROS: negative for nausea, vomiting, abdominal pain, diarrhea, constipation Musculoskeletal ROS: negative for - joint swelling or muscle pain Neurological ROS: Positive for - confusion Dermatological ROS: negative for pruritus and rash  PHYSICAL EXAM:  Vitals:   05/07/22 1356  BP: 131/78  Pulse: 77  .  Ht:157.5 cm (5' 2") Wt:59.9 kg (132 lb 0.9 oz) BSA:Body surface area is 1.62 meters squared. Body mass index is 24.15 kg/m..   GENERAL: Alert, active, oriented x3  HEENT: Pupils equal reactive to light. Extraocular movements are intact. Sclera clear. Palpebral conjunctiva normal red color.Pharynx clear.  NECK: Supple with no palpable mass and no adenopathy.  LUNGS: Sound clear with no rales rhonchi or wheezes.  HEART: Regular rhythm S1 and S2 without murmur.  BREAST: Large palpable mass of the left breast.  Palpable abnormal left axillary lymph node.  EXTREMITIES: Well-developed well-nourished symmetrical with no dependent edema.  NEUROLOGICAL: Awake alert oriented, facial expression symmetrical, moving all extremities.      IMPRESSION:     Malignant neoplasm involving both nipple and areola of left breast in female, estrogen receptor positive  [C50.012, Z17.0]  I had a long discussion with the family  about the surgical alternative of modified radical mastectomy.  I discussed with patient the extensive surgery including total mastectomy with axillary node dissection.  I discussed with family the possible risk of surgery including bleeding, infection, the easing of the wound, seromas, injury to axillary nerves, weakness, among others.  I also discussed with patient the possible risks of anesthesia including pneumonia, heart attack, stroke, worsening of dementia and even death.  I had a long discussion with the patient that without completing standard adjuvant therapy such as radiation therapy and chemotherapy this would not be considered curative.  I will consider resection of the tumor as a palliative indication due to increased mass that will ulcerated skin and decrease her quality of life.  At this time the family would like to proceed with modified radical mastectomy.          PLAN:    1.  Left breast modified radical mastectomy (19307) 2.  CBC, CMP 3.  Avoid taking aspirin or blood thinners 5 days before surgery 4.  Contact us if you have any concern  Patient' daughter and husband verbalized understanding, all questions were answered, and were agreeable with the plan outlined above.   Ingrid Shifrin Cintron-Diaz, MD  Electronically signed by Manuelito Poage Cintron-Diaz, MD  

## 2022-05-07 NOTE — H&P (Signed)
HISTORY OF PRESENT ILLNESS:    [name] is a 82 y.o.female patient who comes for evaluation of discussion of surgical management for breast cancer.  Patient diagnosed with left breast cancer with large palpable mass and clinically positive axillary lymph nodes.  She has advanced dementia to the point to need complete assisting for ADLs.  Her cancer was incidentally found by her family while giving her a bath.  Imaging showed 3.4 cm mass and abnormal left axillary lymph node.  Biopsy showed invasive mammary carcinoma with metastatic disease to the axillary lymph nodes.  She was evaluated by medical oncology and discussion has been going on about further management including surgical intervention versus antiestrogen therapy.  Patient had a PET scan without any evidence of distant metastatic disease.  Patient had a reevaluation by medical oncology and now they are willing to discuss about adjuvant radiation therapy.  As per medical oncology patient not a candidate for chemotherapy due to her medical comorbidities.      PAST MEDICAL HISTORY:  Past Medical History:  Diagnosis Date   Carpal tunnel syndrome    Dyslipidemia    Hypercholesterolemia    Hypertension    Incontinence    Lumbago    Menopause    Mild cognitive impairment with memory loss    Osteoporosis    Type 2 diabetes mellitus (CMS-HCC)    Vitamin D deficiency         PAST SURGICAL HISTORY:   Past Surgical History:  Procedure Laterality Date   BREAST EXCISIONAL BIOPSY     CATARACT EXTRACTION     COLONOSCOPY     MASTECTOMY PARTIAL / LUMPECTOMY           MEDICATIONS:  Outpatient Encounter Medications as of 05/07/2022  Medication Sig Dispense Refill   alendronate (FOSAMAX) 70 MG tablet Take 1 tablet (70 mg total) by mouth once a week 4 tablet 12   amLODIPine (NORVASC) 5 MG tablet Take 5 mg by mouth once daily        atorvastatin (LIPITOR) 80 MG tablet Take 80 mg by mouth every evening.  1   BD INSULIN SYRINGE  ULTRA-FINE 0.3 mL 31 x 5/16" syringe   4   blood glucose diagnostic (ACCU-CHEK AVIVA PLUS TEST STRP) test strip Use 3 times daily dx e10.65 300 strip 2   calcium carbonate-vitamin D3 (CALTRATE 600+D) 600 mg(1,'500mg'$ ) -400 unit tablet Take 2 tablets by mouth once daily     glucagon (BAQSIMI) 3 mg/actuation nasal spray One spray into nose in case of severe hypoglycemia 3 each 1   hydrALAZINE (APRESOLINE) 10 MG tablet Take 1 tablet by mouth 3 (three) times a day     insulin ASPART (NOVOLOG FLEXPEN U-100 INSULIN) pen injector (concentration 100 units/mL) Inject 10 units twice a day at breakfast and lunch. Inject 8 units at supper. ADD SLIDING SCALE AS NECESSARY. MAX DAILY DOSE 50 UNITS. 15 mL 6   insulin DEGLUDEC (TRESIBA FLEXTOUCH U-100) pen injector (concentration 100 units/mL) INJECT 24 UNITS SUBCUTANEOUSLY ONCE DAILY (Patient taking differently: Inject 22 Units subcutaneously once daily) 15 mL 6   losartan-hydrochlorothiazide (HYZAAR) 100-12.5 mg tablet Take 1 tablet by mouth once daily     ondansetron (ZOFRAN-ODT) 4 MG disintegrating tablet Take 1 tablet by mouth every 8 (eight) hours as needed     pen needle, diabetic (BD NANO 2ND GEN PEN NEEDLE) 32 gauge x 5/32" Ndle USE 1 EACH 4 (FOUR) TIMES DAILY 400 each 15   lancets Use 1 each 3 (three)  times daily E10.65 300 each 2   No facility-administered encounter medications on file as of 05/07/2022.     ALLERGIES:   Carrot extract, Colesevelam hcl, and Penicillins   SOCIAL HISTORY:  Social History   Socioeconomic History   Marital status: Married  Tobacco Use   Smoking status: Never   Smokeless tobacco: Never  Vaping Use   Vaping Use: Never used  Substance and Sexual Activity   Alcohol use: No    Alcohol/week: 0.0 standard drinks of alcohol   Drug use: No   Sexual activity: Defer    Partners: Male    FAMILY HISTORY:  Family History  Problem Relation Age of Onset   Diabetes Mother    Osteoarthritis Mother    Alzheimer's disease  Mother    Stroke Brother    Myocardial Infarction (Heart attack) Brother    High blood pressure (Hypertension) Father    Hyperlipidemia (Elevated cholesterol) Father    Diabetes Father    Multiple sclerosis Daughter      GENERAL REVIEW OF SYSTEMS:   General ROS: negative for - chills, fatigue, fever, weight gain or weight loss Allergy and Immunology ROS: negative for - hives  Hematological and Lymphatic ROS: negative for - bleeding problems or bruising, negative for palpable nodes Endocrine ROS: negative for - heat or cold intolerance, hair changes Respiratory ROS: negative for - cough, shortness of breath or wheezing Cardiovascular ROS: no chest pain or palpitations GI ROS: negative for nausea, vomiting, abdominal pain, diarrhea, constipation Musculoskeletal ROS: negative for - joint swelling or muscle pain Neurological ROS: Positive for - confusion Dermatological ROS: negative for pruritus and rash  PHYSICAL EXAM:  Vitals:   05/07/22 1356  BP: 131/78  Pulse: 77  .  Ht:157.5 cm ('5\' 2"'$ ) Wt:59.9 kg (132 lb 0.9 oz) [id number] surface area is 1.62 meters squared. Body mass index is 24.15 kg/m.Marland Kitchen   GENERAL: Alert, active, oriented x3  HEENT: Pupils equal reactive to light. Extraocular movements are intact. Sclera clear. Palpebral conjunctiva normal red color.Pharynx clear.  NECK: Supple with no palpable mass and no adenopathy.  LUNGS: Sound clear with no rales rhonchi or wheezes.  HEART: Regular rhythm S1 and S2 without murmur.  BREAST: Large palpable mass of the left breast.  Palpable abnormal left axillary lymph node.  EXTREMITIES: Well-developed well-nourished symmetrical with no dependent edema.  NEUROLOGICAL: Awake alert oriented, facial expression symmetrical, moving all extremities.      IMPRESSION:     Malignant neoplasm involving both nipple and areola of left breast in female, estrogen receptor positive  [C50.012, Z17.0]  I had a long discussion with the family  about the surgical alternative of modified radical mastectomy.  I discussed with patient the extensive surgery including total mastectomy with axillary node dissection.  I discussed with family the possible risk of surgery including bleeding, infection, the easing of the wound, seromas, injury to axillary nerves, weakness, among others.  I also discussed with patient the possible risks of anesthesia including pneumonia, heart attack, stroke, worsening of dementia and even death.  I had a long discussion with the patient that without completing standard adjuvant therapy such as radiation therapy and chemotherapy this would not be considered curative.  I will consider resection of the tumor as a palliative indication due to increased mass that will ulcerated skin and decrease her quality of life.  At this time the family would like to proceed with modified radical mastectomy.          PLAN:  1.  Left breast modified radical mastectomy [id number]) 2.  CBC, CMP 3.  Avoid taking aspirin or blood thinners 5 days before surgery 4.  Contact us if you have any concern  Patient' daughter and husband verbalized understanding, all questions were answered, and were agreeable with the plan outlined above.   Herbert Pun, MD  Electronically signed by Herbert Pun, MD

## 2022-05-12 ENCOUNTER — Encounter: Payer: Self-pay | Admitting: *Deleted

## 2022-05-12 DIAGNOSIS — C50012 Malignant neoplasm of nipple and areola, left female breast: Secondary | ICD-10-CM

## 2022-05-12 NOTE — Progress Notes (Signed)
Daughter Arrie Aran called asking about some resources that may be available for her mom.   Social work consult entered to discuss further with Bernie.

## 2022-05-14 ENCOUNTER — Inpatient Hospital Stay: Payer: Medicare HMO | Attending: Oncology | Admitting: Licensed Clinical Social Worker

## 2022-05-14 DIAGNOSIS — C50012 Malignant neoplasm of nipple and areola, left female breast: Secondary | ICD-10-CM

## 2022-05-14 NOTE — Progress Notes (Signed)
[place] Work  Initial Assessment   [name] is a 82 y.o. year old female contacted caregiver by phone. Clinical Social Work was referred by medical provider for assessment of psychosocial needs.   SDOH (Social Determinants of Health) assessments performed: Yes SDOH Interventions    Flowsheet Row Clinical Support from 05/14/2022 in [place] [facility] from [facility]/16/2021 in [facility]  SDOH Interventions    Food Insecurity Interventions -- Intervention Not Indicated  Transportation Interventions [place] ([place]. Only) Intervention Not Indicated  Financial Strain Interventions Intervention Not Indicated --  Physical Activity Interventions Intervention Not Indicated --  Stress Interventions Intervention Not Indicated --  Social Connections Interventions Intervention Not Indicated --       SDOH Screenings   Food Insecurity: No Food Insecurity (04/14/2022)  Housing: Low Risk  (04/14/2022)  Transportation Needs: No Transportation Needs (05/14/2022)  Utilities: Not At Risk (04/14/2022)  Alcohol Screen: Low Risk  (09/08/2019)  Depression (PHQ2-9): Low Risk  (04/14/2022)  Financial Resource Strain: Low Risk  (05/14/2022)  Physical Activity: Inactive (05/14/2022)  Social Connections: Socially Integrated (05/14/2022)  Stress: No Stress Concern Present (05/14/2022)  Tobacco Use: Low Risk  (05/04/2022)     Distress Screen completed: No    04/14/2022    2:54 PM  ONCBCN DISTRESS SCREENING  Screening Type Initial Screening  Distress experienced in past week (1-10) 0      Family/Social Information:  Housing Arrangement: patient lives with spouse,  main contact is daughter, Camden Goodenough [phone]  Family members/support persons in your life? Family and Medical Staff Transportation concerns: no, immediate concerns present, patient's daughter is concerned about frequency of radiation treatments and the  ability to provide transportation for patient. CSW recommended Ms. Blanchett contact  Medicare and ask about transportation, gave her information on [facility] and will send referral for cancer center transportation assistance. Employment: Retired  .  Income source: Conservation officer, historic buildings and Supported by [employer] Financial concerns: No Type of concern: Care giving (Child care or elder care services) Food access concerns: no Religious or spiritual practice: Not known Services Currently in place:  [employer]  Coping/ Adjustment to diagnosis: Patient understands treatment plan and what happens next? patient has dx of dementia, family and support system understand treatment Concerns about diagnosis and/or treatment: Quality of life and Caregiving concerns, patient needs assistance with ADLs. Patient reported stressors: Childcare/ elder care and Physical issues Hopes and/or priorities: caregiving assistance, CSW will send referral to CAP program Patient enjoys  N/A Current coping skills/ strengths: Scientist, research (life sciences)  and Supportive family/friends     SUMMARY: Current SDOH Barriers:  Limited social support, ADL IADL limitations, Cognitive Deficits, Memory Deficits, Inability to perform ADL's independently, and Inability to perform IADL's independently  Clinical Social Work Clinical Goal(s):  Patient will follow up with [facility] and [employer] for transportation assistance* as directed by SW  Interventions: Discussed common feeling and emotions when being diagnosed with cancer, and the importance of support during treatment Informed patient of the support team roles and support services at [facility] Provided CSW contact information and encouraged patient to call with any questions or concerns Referred patient to CAP program, [facility], and [facility] transportation assistance and Provided patient with information about CSW role in patient care, ALFs, CAP program, PACE program and home health  information.  Caregiver requested information on stair lift companies.   Follow Up Plan: Patient will contact CSW with any support  or resource needs Patient verbalizes understanding of plan: Yes    [employer], LCSW

## 2022-05-15 ENCOUNTER — Other Ambulatory Visit: Payer: Self-pay

## 2022-05-15 ENCOUNTER — Encounter
Admission: RE | Admit: 2022-05-15 | Discharge: 2022-05-15 | Disposition: A | Discharge status: Home / Self Care | Payer: Medicare HMO | Source: Ambulatory Visit | Attending: General Surgery | Admitting: General Surgery

## 2022-05-15 VITALS — Ht 62.0 in | Wt 132.0 lb

## 2022-05-15 DIAGNOSIS — I152 Hypertension secondary to endocrine disorders: Secondary | ICD-10-CM

## 2022-05-15 DIAGNOSIS — E1069 Type 1 diabetes mellitus with other specified complication: Secondary | ICD-10-CM

## 2022-05-15 DIAGNOSIS — E1065 Type 1 diabetes mellitus with hyperglycemia: Secondary | ICD-10-CM

## 2022-05-15 HISTORY — DX: Unspecified osteoarthritis, unspecified site: M19.90

## 2022-05-15 HISTORY — DX: Atherosclerosis of aorta: I70.0

## 2022-05-15 HISTORY — DX: Type 1 diabetes mellitus without complications: E10.9

## 2022-05-15 HISTORY — DX: Chronic kidney disease, stage 3 unspecified: N18.30

## 2022-05-15 NOTE — Patient Instructions (Addendum)
Your procedure is scheduled on: Monday 05/25/22 To find out your arrival time, please call [phone] between 1PM - 3PM on:   Friday 05/22/22 Report to the Registration Desk on the 1st floor of the [facility]. Valet parking is available.  If your arrival time is 6:00 am, do not arrive before that time as the [place] entrance doors do not open until 6:00 am.  REMEMBER: Instructions that are not followed completely may result in serious medical risk, up to and including death; or upon the discretion of your surgeon and anesthesiologist your surgery may need to be rescheduled.  Do not eat food or drink any liquids after midnight the night before surgery.  No gum chewing or hard candies.  One week prior to surgery: Stop Anti-inflammatories (NSAIDS) such as Advil, Aleve, Ibuprofen, Motrin, Naproxen, Naprosyn and Aspirin based products such as Excedrin, Goody's Powder, BC Powder. You may however, continue to take Tylenol if needed for pain up until the day of surgery.  Stop ANY OVER THE COUNTER supplements until after surgery.  Continue taking all prescribed medications.  **Follow guidelines for insulin and diabetes medications** Continue daily insulin thru Sunday then no insulin prior to arrival for surgery on Monday.  TAKE ONLY THESE MEDICATIONS THE MORNING OF SURGERY WITH A SIP OF WATER:  amLODipine (NORVASC) 5 MG tablet  atorvastatin (LIPITOR) 80 MG tablet  letrozole (FEMARA) 2.5 MG tablet   No Alcohol for 24 hours before or after surgery.  No Smoking including e-cigarettes for 24 hours before surgery.  No chewable tobacco products for at least 6 hours before surgery.  No nicotine patches on the day of surgery.  Do not use any "recreational" drugs for at least a week (preferably 2 weeks) before your surgery.  Please be advised that the combination of cocaine and anesthesia may have negative outcomes, up to and including death. If you test positive for cocaine, your  surgery will be cancelled.  On the morning of surgery brush your teeth with toothpaste and water, you may rinse your mouth with mouthwash if you wish. Do not swallow any toothpaste or mouthwash.  Use CHG Soap or wipes as directed on instruction sheet.  Do not wear lotions, powders, or perfumes. NO DEODORANT.  Do not shave body hair from the neck down 48 hours before surgery.  Wear comfortable clothing (specific to your surgery type) to the hospital.  Do not wear jewelry, make-up, hairpins, clips or nail polish.  Contact lenses, hearing aids and dentures may not be worn into surgery.  Do not bring valuables to the hospital. [facility] is not responsible for any missing/lost belongings or valuables.   Notify your doctor if there is any change in your medical condition (cold, fever, infection).  If you are being discharged the day of surgery, you will not be allowed to drive home. You will need a responsible individual to drive you home and stay with you for 24 hours after surgery.   If you are taking public transportation, you will need to have a responsible individual with you.  If you are being admitted to the hospital overnight, leave your suitcase in the car. After surgery it may be brought to your room.  In case of increased patient census, it may be necessary for you, the patient, to continue your postoperative care in the Same Day Surgery department.  After surgery, you can help prevent lung complications by doing breathing exercises.  Take deep breaths and cough every 1-2 hours. Your  doctor may order a device called an Incentive Spirometer to help you take deep breaths. When coughing or sneezing, hold a pillow firmly against your incision with both hands. This is called "splinting." Doing this helps protect your incision. It also decreases belly discomfort.  Surgery Visitation Policy:  Patients undergoing a surgery or procedure may have two family members or support persons  with them as long as the person is not COVID-19 positive or experiencing its symptoms.   Inpatient Visitation:    Visiting hours are 7 a.m. to 8 p.m. Up to four visitors are allowed at one time in a patient room. The visitors may rotate out with other people during the day. One designated support person (adult) may remain overnight.  Due to an increase in RSV and influenza rates and associated hospitalizations, children ages 45 and under will not be able to visit patients in [facility]. Masks continue to be strongly recommended.  Please call the [facility] Dept. at [phone] if you have any questions about these instructions.  Preparing the Skin Before Surgery     To help prevent the risk of infection at your surgical site, we are now providing you with rinse-free Sage 2% Chlorhexidine Gluconate (CHG) disposable wipes.  Chlorhexidine Gluconate (CHG) Soap  o An antiseptic cleaner that kills germs and bonds with the skin to continue killing germs even after washing  o Used for showering the night before surgery and morning of surgery  The night before surgery: Shower or bathe with warm water. Do not apply perfume, lotions, powders. Wait one hour after shower. Skin should be dry and cool. Open Sage wipe package - use 6 disposable cloths. Wipe body using one cloth for the right arm, one cloth for the left arm, one cloth for the right leg, one cloth for the left leg, one cloth for the chest/abdomen area, and one cloth for the back. Do not use on open wounds or sores. Do not use on face or genitals (private parts). If you are breast feeding, do not use on breasts. 5. Do not rinse, allow to dry. 6. Skin may feel "tacky" for several minutes. 7. Dress in clean clothes. 8. Place clean sheets on your bed and do not sleep with pets.  REPEAT ABOVE ON THE MORNING OF SURGERY BEFORE ARRIVING TO [place].

## 2022-05-18 ENCOUNTER — Encounter
Admission: RE | Admit: 2022-05-18 | Discharge: 2022-05-18 | Disposition: A | Discharge status: Home / Self Care | Payer: Medicare HMO | Source: Ambulatory Visit | Attending: General Surgery | Admitting: General Surgery

## 2022-05-18 DIAGNOSIS — I152 Hypertension secondary to endocrine disorders: Secondary | ICD-10-CM | POA: Diagnosis not present

## 2022-05-18 DIAGNOSIS — E1165 Type 2 diabetes mellitus with hyperglycemia: Secondary | ICD-10-CM | POA: Diagnosis not present

## 2022-05-18 DIAGNOSIS — Z01810 Encounter for preprocedural cardiovascular examination: Secondary | ICD-10-CM | POA: Insufficient documentation

## 2022-05-18 DIAGNOSIS — I4510 Unspecified right bundle-branch block: Secondary | ICD-10-CM | POA: Diagnosis not present

## 2022-05-18 DIAGNOSIS — E785 Hyperlipidemia, unspecified: Secondary | ICD-10-CM | POA: Insufficient documentation

## 2022-05-18 DIAGNOSIS — E1069 Type 1 diabetes mellitus with other specified complication: Secondary | ICD-10-CM

## 2022-05-18 DIAGNOSIS — E1065 Type 1 diabetes mellitus with hyperglycemia: Secondary | ICD-10-CM

## 2022-05-18 DIAGNOSIS — E1159 Type 2 diabetes mellitus with other circulatory complications: Secondary | ICD-10-CM | POA: Insufficient documentation

## 2022-05-24 MED ORDER — ORAL CARE MOUTH RINSE: 15.0000 mL | Freq: Once | OROMUCOSAL | Status: AC

## 2022-05-24 MED ORDER — FAMOTIDINE 20 MG PO TABS: 20.0000 mg | ORAL_TABLET | Freq: Once | ORAL | Status: AC

## 2022-05-24 MED ORDER — CHLORHEXIDINE GLUCONATE 0.12 % MT SOLN: 15.0000 mL | Freq: Once | OROMUCOSAL | Status: AC

## 2022-05-24 MED ORDER — SODIUM CHLORIDE 0.9 % IV SOLN: INTRAVENOUS | Status: DC

## 2022-05-24 MED ORDER — CEFAZOLIN SODIUM-DEXTROSE 2-4 GM/100ML-% IV SOLN
2.0000 g | INTRAVENOUS | Status: AC
  Administered 2022-05-25: 2 g via INTRAVENOUS

## 2022-05-25 ENCOUNTER — Other Ambulatory Visit: Payer: Self-pay

## 2022-05-25 ENCOUNTER — Observation Stay
Admission: RE | Admit: 2022-05-25 | Discharge: 2022-05-26 | Disposition: A | Discharge status: Home / Self Care | Payer: Medicare HMO | Source: Ambulatory Visit | Attending: General Surgery | Admitting: General Surgery

## 2022-05-25 ENCOUNTER — Encounter
Admission: RE | Disposition: A | Discharge status: Home / Self Care | Payer: Self-pay | Source: Ambulatory Visit | Attending: General Surgery

## 2022-05-25 ENCOUNTER — Ambulatory Visit: Payer: Medicare HMO | Admitting: Urgent Care

## 2022-05-25 ENCOUNTER — Encounter: Payer: Self-pay | Admitting: General Surgery

## 2022-05-25 DIAGNOSIS — E119 Type 2 diabetes mellitus without complications: Secondary | ICD-10-CM | POA: Diagnosis not present

## 2022-05-25 DIAGNOSIS — Z170 Estrogen receptor positive status [ER+]: Secondary | ICD-10-CM | POA: Insufficient documentation

## 2022-05-25 DIAGNOSIS — C50512 Malignant neoplasm of lower-outer quadrant of left female breast: Secondary | ICD-10-CM | POA: Diagnosis not present

## 2022-05-25 DIAGNOSIS — Z794 Long term (current) use of insulin: Secondary | ICD-10-CM | POA: Insufficient documentation

## 2022-05-25 DIAGNOSIS — Z79899 Other long term (current) drug therapy: Secondary | ICD-10-CM | POA: Diagnosis not present

## 2022-05-25 DIAGNOSIS — E1065 Type 1 diabetes mellitus with hyperglycemia: Secondary | ICD-10-CM

## 2022-05-25 DIAGNOSIS — I10 Essential (primary) hypertension: Secondary | ICD-10-CM | POA: Insufficient documentation

## 2022-05-25 DIAGNOSIS — K8012 Calculus of gallbladder with acute and chronic cholecystitis without obstruction: Secondary | ICD-10-CM | POA: Diagnosis not present

## 2022-05-25 DIAGNOSIS — C50919 Malignant neoplasm of unspecified site of unspecified female breast: Secondary | ICD-10-CM | POA: Diagnosis present

## 2022-05-25 DIAGNOSIS — C50012 Malignant neoplasm of nipple and areola, left female breast: Secondary | ICD-10-CM | POA: Diagnosis not present

## 2022-05-25 HISTORY — PX: MASTECTOMY MODIFIED RADICAL: SHX5962

## 2022-05-25 LAB — GLUCOSE, CAPILLARY
Glucose-Capillary: 236 mg/dL — ABNORMAL HIGH (ref 70–99)
Glucose-Capillary: 241 mg/dL — ABNORMAL HIGH (ref 70–99)
Glucose-Capillary: 187 mg/dL — ABNORMAL HIGH (ref 70–99)
Glucose-Capillary: 198 mg/dL — ABNORMAL HIGH (ref 70–99)
Glucose-Capillary: 297 mg/dL — ABNORMAL HIGH (ref 70–99)

## 2022-05-25 SURGERY — MASTECTOMY, MODIFIED RADICAL
Anesthesia: General | Site: Breast | Laterality: Left

## 2022-05-25 MED ORDER — INSULIN GLARGINE-YFGN 100 UNIT/ML ~~LOC~~ SOLN
22.0000 [IU] | Freq: Every day | SUBCUTANEOUS | Status: DC
  Administered 2022-05-26: 22 [IU] via SUBCUTANEOUS
  Filled 2022-05-25: qty 0.22

## 2022-05-25 MED ORDER — SODIUM CHLORIDE (PF) 0.9 % IJ SOLN
INTRAMUSCULAR | Status: AC
  Filled 2022-05-25: qty 50

## 2022-05-25 MED ORDER — ENOXAPARIN SODIUM 40 MG/0.4ML IJ SOSY
40.0000 mg | PREFILLED_SYRINGE | INTRAMUSCULAR | Status: DC
  Administered 2022-05-26: 40 mg via SUBCUTANEOUS
  Filled 2022-05-25: qty 0.4

## 2022-05-25 MED ORDER — PROPOFOL 10 MG/ML IV BOLUS
INTRAVENOUS | Status: DC | PRN
  Administered 2022-05-25: 150 mg via INTRAVENOUS

## 2022-05-25 MED ORDER — CEFAZOLIN SODIUM-DEXTROSE 2-4 GM/100ML-% IV SOLN
INTRAVENOUS | Status: AC
  Filled 2022-05-25: qty 100

## 2022-05-25 MED ORDER — LOSARTAN POTASSIUM 50 MG PO TABS
100.0000 mg | ORAL_TABLET | Freq: Every day | ORAL | Status: DC
  Administered 2022-05-25 – 2022-05-26 (×2): 100 mg via ORAL
  Filled 2022-05-25 (×2): qty 2

## 2022-05-25 MED ORDER — BUPIVACAINE LIPOSOME 1.3 % IJ SUSP
INTRAMUSCULAR | Status: DC | PRN
  Administered 2022-05-25: 20 mL

## 2022-05-25 MED ORDER — MORPHINE SULFATE (PF) 2 MG/ML IV SOLN: 2.0000 mg | INTRAVENOUS | Status: DC | PRN

## 2022-05-25 MED ORDER — HYDROCHLOROTHIAZIDE 12.5 MG PO TABS
12.5000 mg | ORAL_TABLET | Freq: Every day | ORAL | Status: DC
  Administered 2022-05-25 – 2022-05-26 (×2): 12.5 mg via ORAL
  Filled 2022-05-25 (×2): qty 1

## 2022-05-25 MED ORDER — BUPIVACAINE-EPINEPHRINE 0.5% -1:200000 IJ SOLN
INTRAMUSCULAR | Status: DC | PRN
  Administered 2022-05-25: 30 mL

## 2022-05-25 MED ORDER — INSULIN ASPART 100 UNIT/ML IJ SOLN: 5.0000 [IU] | Freq: Once | INTRAMUSCULAR | Status: AC

## 2022-05-25 MED ORDER — PROPOFOL 10 MG/ML IV BOLUS
INTRAVENOUS | Status: AC
  Filled 2022-05-25: qty 20

## 2022-05-25 MED ORDER — CHLORHEXIDINE GLUCONATE 0.12 % MT SOLN
OROMUCOSAL | Status: AC
  Administered 2022-05-25: 15 mL via OROMUCOSAL
  Filled 2022-05-25: qty 15

## 2022-05-25 MED ORDER — ACETAMINOPHEN 10 MG/ML IV SOLN
INTRAVENOUS | Status: AC
  Filled 2022-05-25: qty 100

## 2022-05-25 MED ORDER — PHENYLEPHRINE 80 MCG/ML (10ML) SYRINGE FOR IV PUSH (FOR BLOOD PRESSURE SUPPORT)
PREFILLED_SYRINGE | INTRAVENOUS | Status: DC | PRN
  Administered 2022-05-25 (×2): 80 ug via INTRAVENOUS

## 2022-05-25 MED ORDER — AMLODIPINE BESYLATE 5 MG PO TABS
5.0000 mg | ORAL_TABLET | Freq: Every day | ORAL | Status: DC
  Administered 2022-05-26: 5 mg via ORAL
  Filled 2022-05-25: qty 1

## 2022-05-25 MED ORDER — FAMOTIDINE 20 MG PO TABS
ORAL_TABLET | ORAL | Status: AC
  Administered 2022-05-25: 20 mg via ORAL
  Filled 2022-05-25: qty 1

## 2022-05-25 MED ORDER — LOSARTAN POTASSIUM-HCTZ 100-12.5 MG PO TABS: 1.0000 | ORAL_TABLET | Freq: Every day | ORAL | Status: DC

## 2022-05-25 MED ORDER — BUPIVACAINE HCL (PF) 0.5 % IJ SOLN
INTRAMUSCULAR | Status: AC
  Filled 2022-05-25: qty 30

## 2022-05-25 MED ORDER — INSULIN ASPART 100 UNIT/ML IJ SOLN
INTRAMUSCULAR | Status: AC
  Administered 2022-05-25: 5 [IU] via SUBCUTANEOUS
  Filled 2022-05-25: qty 1

## 2022-05-25 MED ORDER — INSULIN ASPART 100 UNIT/ML IJ SOLN
3.0000 [IU] | Freq: Once | INTRAMUSCULAR | Status: AC
  Administered 2022-05-25: 3 [IU] via SUBCUTANEOUS
  Filled 2022-05-25: qty 1

## 2022-05-25 MED ORDER — DEXAMETHASONE SODIUM PHOSPHATE 10 MG/ML IJ SOLN
INTRAMUSCULAR | Status: DC | PRN
  Administered 2022-05-25: 5 mg via INTRAVENOUS

## 2022-05-25 MED ORDER — FENTANYL CITRATE (PF) 100 MCG/2ML IJ SOLN
INTRAMUSCULAR | Status: AC
  Filled 2022-05-25: qty 2

## 2022-05-25 MED ORDER — SODIUM CHLORIDE 0.9 % IV SOLN: INTRAVENOUS | Status: DC

## 2022-05-25 MED ORDER — ONDANSETRON 4 MG PO TBDP: 4.0000 mg | ORAL_TABLET | Freq: Four times a day (QID) | ORAL | Status: DC | PRN

## 2022-05-25 MED ORDER — HYDROCODONE-ACETAMINOPHEN 5-325 MG PO TABS
1.0000 | ORAL_TABLET | ORAL | Status: DC | PRN
  Administered 2022-05-25 – 2022-05-26 (×2): 2 via ORAL
  Filled 2022-05-25 (×2): qty 2

## 2022-05-25 MED ORDER — INSULIN GLARGINE-YFGN 100 UNIT/ML ~~LOC~~ SOLN
22.0000 [IU] | Freq: Every day | SUBCUTANEOUS | Status: DC
  Filled 2022-05-25: qty 0.22

## 2022-05-25 MED ORDER — ONDANSETRON HCL 4 MG/2ML IJ SOLN
INTRAMUSCULAR | Status: DC | PRN
  Administered 2022-05-25: 4 mg via INTRAVENOUS

## 2022-05-25 MED ORDER — ALENDRONATE SODIUM 70 MG PO TABS: 70.0000 mg | ORAL_TABLET | ORAL | Status: DC

## 2022-05-25 MED ORDER — OXYCODONE HCL 5 MG/5ML PO SOLN: 5.0000 mg | Freq: Once | ORAL | Status: DC | PRN

## 2022-05-25 MED ORDER — HYDRALAZINE HCL 10 MG PO TABS: 10.0000 mg | ORAL_TABLET | Freq: Three times a day (TID) | ORAL | Status: DC | PRN

## 2022-05-25 MED ORDER — FENTANYL CITRATE (PF) 100 MCG/2ML IJ SOLN: 25.0000 ug | INTRAMUSCULAR | Status: DC | PRN

## 2022-05-25 MED ORDER — ACETAMINOPHEN 10 MG/ML IV SOLN
INTRAVENOUS | Status: DC | PRN
  Administered 2022-05-25: 1000 mg via INTRAVENOUS

## 2022-05-25 MED ORDER — FENTANYL CITRATE (PF) 100 MCG/2ML IJ SOLN
INTRAMUSCULAR | Status: DC | PRN
  Administered 2022-05-25 (×4): 25 ug via INTRAVENOUS

## 2022-05-25 MED ORDER — INSULIN ASPART 100 UNIT/ML IJ SOLN
0.0000 [IU] | Freq: Three times a day (TID) | INTRAMUSCULAR | Status: DC
  Administered 2022-05-25: 3 [IU] via SUBCUTANEOUS
  Administered 2022-05-26: 8 [IU] via SUBCUTANEOUS
  Administered 2022-05-26: 5 [IU] via SUBCUTANEOUS
  Filled 2022-05-25 (×3): qty 1

## 2022-05-25 MED ORDER — ONDANSETRON HCL 4 MG/2ML IJ SOLN: 4.0000 mg | Freq: Four times a day (QID) | INTRAMUSCULAR | Status: DC | PRN

## 2022-05-25 MED ORDER — LIDOCAINE HCL (CARDIAC) PF 100 MG/5ML IV SOSY
PREFILLED_SYRINGE | INTRAVENOUS | Status: DC | PRN
  Administered 2022-05-25: 60 mg via INTRAVENOUS

## 2022-05-25 MED ORDER — DONEPEZIL HCL 5 MG PO TABS
10.0000 mg | ORAL_TABLET | Freq: Every day | ORAL | Status: DC
  Administered 2022-05-25: 10 mg via ORAL
  Filled 2022-05-25: qty 2

## 2022-05-25 MED ORDER — EPINEPHRINE PF 1 MG/ML IJ SOLN
INTRAMUSCULAR | Status: AC
  Filled 2022-05-25: qty 1

## 2022-05-25 MED ORDER — BUPIVACAINE LIPOSOME 1.3 % IJ SUSP
INTRAMUSCULAR | Status: AC
  Filled 2022-05-25: qty 20

## 2022-05-25 MED ORDER — OXYCODONE HCL 5 MG PO TABS: 5.0000 mg | ORAL_TABLET | Freq: Once | ORAL | Status: DC | PRN

## 2022-05-25 SURGICAL SUPPLY — 45 items
ADH SKN CLS APL DERMABOND .7 (GAUZE/BANDAGES/DRESSINGS) ×2
APL PRP STRL LF DISP 70% ISPRP (MISCELLANEOUS) ×1
BINDER BREAST LRG (GAUZE/BANDAGES/DRESSINGS) IMPLANT
BULB RESERV EVAC DRAIN JP 100C (MISCELLANEOUS) IMPLANT
CHLORAPREP W/TINT 26 (MISCELLANEOUS) ×1 IMPLANT
COVER PROBE GAMMA FINDER SLV (MISCELLANEOUS) ×1 IMPLANT
DERMABOND ADVANCED .7 DNX12 (GAUZE/BANDAGES/DRESSINGS) ×1 IMPLANT
DRAIN CHANNEL JP 15F RND 16 (MISCELLANEOUS) ×2 IMPLANT
DRAIN CHANNEL JP 19F (MISCELLANEOUS) IMPLANT
DRAPE LAPAROTOMY TRNSV 106X77 (MISCELLANEOUS) ×1 IMPLANT
DRSG GAUZE FLUFF 36X18 (GAUZE/BANDAGES/DRESSINGS) ×1 IMPLANT
ELECT REM PT RETURN 9FT ADLT (ELECTROSURGICAL) ×1
ELECTRODE REM PT RTRN 9FT ADLT (ELECTROSURGICAL) ×1 IMPLANT
GAUZE 4X4 16PLY ~~LOC~~+RFID DBL (SPONGE) ×1 IMPLANT
GAUZE SPONGE 4X4 12PLY STRL (GAUZE/BANDAGES/DRESSINGS) ×1 IMPLANT
GLOVE BIO SURGEON STRL SZ 6.5 (GLOVE) ×1 IMPLANT
GLOVE BIOGEL PI IND STRL 6.5 (GLOVE) ×1 IMPLANT
GOWN STRL REUS W/ TWL LRG LVL3 (GOWN DISPOSABLE) ×2 IMPLANT
GOWN STRL REUS W/TWL LRG LVL3 (GOWN DISPOSABLE) ×4
KIT TURNOVER KIT A (KITS) ×1 IMPLANT
LABEL OR SOLS (LABEL) ×1 IMPLANT
MANIFOLD NEPTUNE II (INSTRUMENTS) ×1 IMPLANT
MARGIN MAP 10MM (MISCELLANEOUS) ×1 IMPLANT
PACK BASIN MAJOR ARMC (MISCELLANEOUS) ×1 IMPLANT
PAD ABD DERMACEA PRESS 5X9 (GAUZE/BANDAGES/DRESSINGS) ×1 IMPLANT
SPONGE T-LAP 18X18 ~~LOC~~+RFID (SPONGE) ×2 IMPLANT
SUT ETHILON 3-0 FS-10 30 BLK (SUTURE) ×1
SUT ETHILON 4-0 (SUTURE)
SUT ETHILON 4-0 FS2 18XMFL BLK (SUTURE)
SUT MNCRL 4-0 (SUTURE) ×1
SUT MNCRL 4-0 27XMFL (SUTURE) ×1
SUT SILK 2 0 SH (SUTURE) IMPLANT
SUT SILK 3 0 (SUTURE) ×1
SUT SILK 3-0 (SUTURE) ×1 IMPLANT
SUT SILK 3-0 18XBRD TIE 12 (SUTURE) IMPLANT
SUT VIC AB 3-0 54X BRD REEL (SUTURE) ×1 IMPLANT
SUT VIC AB 3-0 BRD 54 (SUTURE) ×1
SUT VIC AB 3-0 SH 27 (SUTURE)
SUT VIC AB 3-0 SH 27X BRD (SUTURE) ×2 IMPLANT
SUT VIC AB 3-0 SH 8-18 (SUTURE) ×1 IMPLANT
SUTURE EHLN 3-0 FS-10 30 BLK (SUTURE) ×1 IMPLANT
SUTURE ETHLN 4-0 FS2 18XMF BLK (SUTURE) IMPLANT
SUTURE MNCRL 4-0 27XMF (SUTURE) ×2 IMPLANT
TRAP FLUID SMOKE EVACUATOR (MISCELLANEOUS) ×1 IMPLANT
WATER STERILE IRR 500ML POUR (IV SOLUTION) ×1 IMPLANT

## 2022-05-25 NOTE — Op Note (Signed)
Preoperative diagnosis: Carcinoma of the left breast.   Postoperative diagnosis: Carcinoma of the left breast.  Procedure: Left modified radical mastectomy.  Anesthesia: GETA  Surgeon: Dr. Windell Moment  Wound Classification: Clean   Indications: Patient is a 82 y.o. female who had an abnormal mammogram that on workup with core needle biopsy was found to be invasive ductal carcinoma of the breast. She was demonstrated to have axillary node involvement. After discussion of alternatives, the patient would benefit best of modified radical mastectomy as oncologic treatment.   Findings: 1.  Clinically palpable abnormal axillary lymph nodes.  2.  Large palpable mass at the lateral left breast.   Description of procedure: The patient was brought to the operating room and the site of surgery was confirmed. General anesthesia was induced. A time-out was completed verifying correct patient, procedure, site, positioning, and implant(s) and/or special equipment prior to beginning this procedure. The breast, chest wall, axilla, and upper arm and neck were prepped and draped in the usual sterile fashion.  A skin incision was made that encompassed the nipple-areola complex and the previous biopsy scar and passed in a generally oblique direction across the breast. Flaps were raised in the avascular plane between subcutaneous tissue and breast tissue from clavicle superiorly, the sternum medially, the anterior rectus sheath inferiorly, and the anterior border of the latissimus dorsi muscle laterally. Hemostasis was achieved in the flaps. Next, the breast tissue and underlying pectoralis fascia were excised from the pectoralis major muscle, progressing from medially to laterally. At the lateral border of the pectoralis major muscle, the breast tissue was swung laterally and dissection progressed under the muscle.  The clavipectoral fascia was then incised along the edge of the pectoralis major and the pectoralis  major and minor were freed from surrounding fat and nodal tissue. Dissection progressed first under the pectoralis major and then under the pectoralis minor muscle. The pectoral muscles were retracted medially with a Richardson retractor. The medial pectoral neurovascular bundle was identified and preserved. The level II nodal tissue deep in the pectoralis minor was included in the dissection. The axillary vein was then identified and cleared of overlying fat. The first branch off the axillary vein was clamped, divided, and tied with 2-0 silk ties. The thoracodorsal nerve was then identified deep to the ligated vein and preserved. The long thoracic nerve was then identified along the edge of the latissimus dorsi on the chest wall and preserved. The remaining nodal tissue between these nerves was then carefully removed, taking care to protect the nerves. The specimen, consisting of breast and attached nodal tissue, was then excised by dividing the remaining lateral pedical and sent to pathology.  The wound was irrigated and hemostasis was achieved.  Two closed suction drains were brought into the operative field through a separate stab incision and sutured to the skin with a 3-0 nylon suture. The wound was closed with interrupted 3-0 Vicryl to the subcutaneous layer, followed by a subcuticular layer of Monocryl 3-0. The wound was dressed.  The patient tolerated the procedure well and was taken to the postanesthesia care unit in stable condition.    Specimen: Left Breast Modified radical mastectomy  Complications: None  Estimated Blood Loss: 25 mL

## 2022-05-25 NOTE — Anesthesia Preprocedure Evaluation (Addendum)
Anesthesia Evaluation  Patient identified by MRN, date of birth, ID band Patient awake and Patient confused    Reviewed: Allergy & Precautions, NPO status , Patient's Chart, lab work & pertinent test results  History of Anesthesia Complications Negative for: history of anesthetic complications  Airway Mallampati: III  TM Distance: >3 FB Neck ROM: full    Dental  (+) Chipped, Poor Dentition, Missing   Pulmonary neg pulmonary ROS, neg shortness of breath   Pulmonary exam normal        Cardiovascular Exercise Tolerance: Good hypertension, (-) angina Normal cardiovascular exam+ dysrhythmias      Neuro/Psych       Dementia  Neuromuscular disease    GI/Hepatic negative GI ROS, Neg liver ROS,neg GERD  ,,  Endo/Other  diabetes, Type 2, Insulin Dependent    Renal/GU Renal disease     Musculoskeletal   Abdominal   Peds  Hematology negative hematology ROS (+)   Anesthesia Other Findings Past Medical History: No date: Arthritis No date: Atherosclerosis of aorta (HCC) No date: CKD (chronic kidney disease) stage 3, GFR 30-59 ml/min (HCC) No date: Dementia ([place]) No date: Diabetes mellitus without complication (HCC) No date: Dysrhythmia No date: Heart murmur No date: Hypertension No date: Type 1 diabetes ([place])  Past Surgical History: 04/07/2022: AXILLARY LYMPH NODE BIOPSY; Left     Comment:  bx done, hydromarker placed, path pending 04/07/2022: BREAST BIOPSY; Left     Comment:  [place] bx mass, ribbon marker, path pending 04/07/2022: BREAST BIOPSY; Left     Comment:  [place] LT BREAST BX W LOC DEV 1ST LESION IMG BX [place]               GUIDE 04/07/2022 [facility] No date: BREAST EXCISIONAL BIOPSY; Left     Comment:  neg 1970: BREAST SURGERY No date: EYE SURGERY     Comment:  detached retina     Reproductive/Obstetrics negative OB ROS                             Anesthesia Physical Anesthesia  Plan  ASA: 3  Anesthesia Plan: General LMA   Post-op Pain Management:    Induction: Intravenous  PONV Risk Score and Plan: Dexamethasone, Ondansetron, Midazolam and Treatment may vary due to age or medical condition  Airway Management Planned: LMA  Additional Equipment:   Intra-op Plan:   Post-operative Plan: Extubation in OR  Informed Consent: I have reviewed the patients History and Physical, chart, labs and discussed the procedure including the risks, benefits and alternatives for the proposed anesthesia with the patient or authorized representative who has indicated his/her understanding and acceptance.     Dental Advisory Given  Plan Discussed with: Anesthesiologist, CRNA and Surgeon  Anesthesia Plan Comments: (Patient and daughter consented for risks of anesthesia including but not limited to:  - adverse reactions to medications - damage to eyes, teeth, lips or other oral mucosa - nerve damage due to positioning  - sore throat or hoarseness - Damage to heart, brain, nerves, lungs, other parts of body or loss of life  They voiced understanding.)       Anesthesia Quick Evaluation

## 2022-05-25 NOTE — Inpatient Diabetes Management (Addendum)
Inpatient Diabetes Program Recommendations  AACE/ADA: New Consensus Statement on Inpatient Glycemic Control (2015)  Target Ranges:  Prepandial:   less than 140 mg/dL      Peak postprandial:   less than 180 mg/dL (1-2 hours)      Critically ill patients:  140 - 180 mg/dL    Latest Reference Range & Units 05/25/22 08:56 05/25/22 09:59 05/25/22 13:10  Glucose-Capillary 70 - 99 mg/dL 236 (H) 241 (H)  5 units Novolog  5 mg Decadron @1103  187 (H)  (H): Data is abnormally high   History: Type 1 Diabetes   Home DM Meds: Tresiba 22 units Daily      Novolog 8-12 units TID           MD- Please order the following for pt post-op:  1. Start Semglee 22 units QHS (start tonight)  2. Start Novolog Sensitive Correction Scale/ SSI (0-9 units) TID AC + HS (start at 5pm)    Most Recent A1c was 6.7% (Dec 2023 at ENDO office visit)  ENDO: Dr. Honor Junes with [facility]  No Changes made to the insulin regimen at that visit: Tresiba 22 units Daily Novolog 10 units with Breakfast and Lunch Novolog 8 units with Dinner Takes extra 2 units Novolog TID with meals if Pre-Meal CBG >220     --Will follow patient during hospitalization--  Wyn Quaker RN, MSN, [place] [facility] Diabetes Coordinator Inpatient Glycemic Control Team Team Pager: [phone] (8a-5p)

## 2022-05-25 NOTE — Progress Notes (Signed)
PHARMACIST - PHYSICIAN COMMUNICATION  CONCERNING: P&T Medication Policy Regarding Oral Bisphosphonates  RECOMMENDATION: Your order for alendronate (Fosamax), ibandronate (Boniva), or risedronate (Actonel) has been discontinued at this time.  If the patient's post-hospital medical condition warrants safe use of this class of drugs, please resume the pre-hospital regimen upon discharge.  DESCRIPTION:  Alendronate (Fosamax), ibandronate (Boniva), and risedronate (Actonel) can cause severe esophageal erosions in patients who are unable to remain upright at least 30 minutes after taking this medication.   Since brief interruptions in therapy are thought to have minimal impact on bone mineral density, the [place] has established that bisphosphonate orders should be routinely discontinued during hospitalization.   To override this safety policy and permit administration of Boniva, Fosamax, or Actonel in the hospital, prescribers must write "DO NOT HOLD" in the comments section when placing the order for this class of medications.  Lu Duffel, PharmD, MBA

## 2022-05-25 NOTE — Transfer of Care (Signed)
Immediate Anesthesia Transfer of Care Note  Patient: [name]  Procedure(s) Performed: MASTECTOMY MODIFIED RADICAL (Left: Breast)  Patient Location: PACU  Anesthesia Type:General  Level of Consciousness: sedated  Airway & Oxygen Therapy: Patient Spontanous Breathing and Patient connected to face mask oxygen  Post-op Assessment: Report given to RN and Post -op Vital signs reviewed and stable  Post vital signs: Reviewed and stable  Last Vitals:  Vitals Value Taken Time  BP 148/55 05/25/22 1300  Temp 36.3 C 05/25/22 1300  Pulse 71 05/25/22 1302  Resp 14 05/25/22 1302  SpO2 93 % 05/25/22 1302  Vitals shown include unvalidated device data.  Last Pain:  Vitals:   05/25/22 0920  TempSrc: Tympanic  PainSc: 0-No pain         Complications: No notable events documented.

## 2022-05-25 NOTE — Anesthesia Procedure Notes (Signed)
Procedure Name: LMA Insertion Date/Time: 05/25/2022 10:52 AM  Performed by: Biagio Borg, CRNAPre-anesthesia Checklist: Patient identified, Emergency Drugs available, Suction available and Patient being monitored Patient Re-evaluated:Patient Re-evaluated prior to induction Oxygen Delivery Method: Circle system utilized Preoxygenation: Pre-oxygenation with 100% oxygen Induction Type: IV induction Ventilation: Mask ventilation without difficulty LMA: LMA inserted LMA Size: 4.0 Tube type: Oral Number of attempts: 1 Placement Confirmation: positive ETCO2 and breath sounds checked- equal and bilateral Tube secured with: Tape Dental Injury: Teeth and Oropharynx as per pre-operative assessment

## 2022-05-25 NOTE — Interval H&P Note (Signed)
History and Physical Interval Note:  05/25/2022 10:37 AM  [name]  has presented today for surgery, with the diagnosis of C50.012 Z17.0 malignant neoplasm involving both nipple and aerola of lt breast in female, estrogen receptor positive.  The various methods of treatment have been discussed with the patient and family. After consideration of risks, benefits and other options for treatment, the patient has consented to  Procedure(s): MASTECTOMY MODIFIED RADICAL (Left) as a surgical intervention.  The patient's history has been reviewed, patient examined, no change in status, stable for surgery.  I have reviewed the patient's chart and labs.  Questions were answered to the patient's satisfaction.     Herbert Pun

## 2022-05-25 NOTE — Anesthesia Postprocedure Evaluation (Signed)
Anesthesia Post Note  Patient: [name]  Procedure(s) Performed: MASTECTOMY MODIFIED RADICAL (Left: Breast)  Patient location during evaluation: PACU Anesthesia Type: General Level of consciousness: awake and alert Pain management: pain level controlled Vital Signs Assessment: post-procedure vital signs reviewed and stable Respiratory status: spontaneous breathing, nonlabored ventilation, respiratory function stable and patient connected to nasal cannula oxygen Cardiovascular status: blood pressure returned to baseline and stable Postop Assessment: no apparent nausea or vomiting Anesthetic complications: no   No notable events documented.   Last Vitals:  Vitals:   05/25/22 1415 05/25/22 1430  BP: (!) 116/58 (!) 124/52  Pulse: 74 74  Resp: 14 12  Temp:  (!) 36.2 C  SpO2: 95% 92%    Last Pain:  Vitals:   05/25/22 1430  TempSrc:   PainSc: 0-No pain                 Precious Haws Onyx Schirmer

## 2022-05-26 ENCOUNTER — Encounter: Payer: Self-pay | Admitting: General Surgery

## 2022-05-26 DIAGNOSIS — Z794 Long term (current) use of insulin: Secondary | ICD-10-CM | POA: Diagnosis not present

## 2022-05-26 DIAGNOSIS — E119 Type 2 diabetes mellitus without complications: Secondary | ICD-10-CM | POA: Diagnosis not present

## 2022-05-26 DIAGNOSIS — Z79899 Other long term (current) drug therapy: Secondary | ICD-10-CM | POA: Diagnosis not present

## 2022-05-26 DIAGNOSIS — Z170 Estrogen receptor positive status [ER+]: Secondary | ICD-10-CM | POA: Diagnosis not present

## 2022-05-26 DIAGNOSIS — C50012 Malignant neoplasm of nipple and areola, left female breast: Secondary | ICD-10-CM | POA: Diagnosis not present

## 2022-05-26 DIAGNOSIS — I10 Essential (primary) hypertension: Secondary | ICD-10-CM | POA: Diagnosis not present

## 2022-05-26 LAB — BASIC METABOLIC PANEL
Anion gap: 6 (ref 5–15)
BUN: 12 mg/dL (ref 8–23)
CO2: 21 mmol/L — ABNORMAL LOW (ref 22–32)
Calcium: 7.4 mg/dL — ABNORMAL LOW (ref 8.9–10.3)
Chloride: 112 mmol/L — ABNORMAL HIGH (ref 98–111)
Creatinine, Ser: 0.46 mg/dL (ref 0.44–1.00)
GFR, Estimated: >60 mL/min (ref >60)
Glucose, Bld: 247 mg/dL — ABNORMAL HIGH (ref 70–99)
Potassium: 3.2 mmol/L — ABNORMAL LOW (ref 3.5–5.1)
Sodium: 139 mmol/L (ref 135–145)

## 2022-05-26 LAB — GLUCOSE, CAPILLARY
Glucose-Capillary: 260 mg/dL — ABNORMAL HIGH (ref 70–99)
Glucose-Capillary: 238 mg/dL — ABNORMAL HIGH (ref 70–99)

## 2022-05-26 LAB — CBC
HCT: 30.3 % — ABNORMAL LOW (ref 36.0–46.0)
Hemoglobin: 10.3 g/dL — ABNORMAL LOW (ref 12.0–15.0)
MCH: 29.6 pg (ref 26.0–34.0)
MCHC: 34 g/dL (ref 30.0–36.0)
MCV: 87.1 fL (ref 80.0–100.0)
Platelets: 172 10*3/uL (ref 150–400)
RBC: 3.48 MIL/uL — ABNORMAL LOW (ref 3.87–5.11)
RDW: 13.4 % (ref 11.5–15.5)
WBC: 8.9 10*3/uL (ref 4.0–10.5)
nRBC: 0 % (ref 0.0–0.2)

## 2022-05-26 MED ORDER — TRAMADOL HCL 50 MG PO TABS: 50.0000 mg | ORAL_TABLET | Freq: Four times a day (QID) | ORAL | 0 refills | Status: DC | PRN

## 2022-05-26 MED ORDER — ORAL CARE MOUTH RINSE: 15.0000 mL | OROMUCOSAL | Status: DC | PRN

## 2022-05-26 NOTE — Discharge Summary (Signed)
Patient ID: [name] MRN: [id number] DOB/AGE: Jan 16, 1941 82 y.o.  Admit date: 05/25/2022 Discharge date: 05/26/2022   Discharge Diagnoses:  Principal Problem:   Breast cancer [facility])   Procedures: Left breast modified radical mastectomy  Hospital Course: Patient with advanced left breast cancer.  She had left breast modified radical mastectomy.  The patient is recovering adequately.  She tolerated the procedure well.  Today with pain controlled.  She has been able to get out of bed.  Drain with bloody output with adequate amounts.  No ischemic concern of the chest flap.  Physical Exam Vitals reviewed.  Constitutional:      Appearance: Normal appearance.  HENT:     Head: Normocephalic.  Cardiovascular:     Rate and Rhythm: Normal rate and regular rhythm.  Pulmonary:     Effort: Pulmonary effort is normal.     Breath sounds: Normal breath sounds.  Chest:  Breasts:    Left: Absent.     Comments: Left chest wound without bruises, no ischemic tissue, no drainage, no erythema.  No fluid collection. Abdominal:     General: Abdomen is flat.  Musculoskeletal:     Cervical back: Normal range of motion.  Skin:    Capillary Refill: Capillary refill takes less than 2 seconds.  Neurological:     General: No focal deficit present.     Mental Status: She is alert.      Consults: None  Disposition: Discharge disposition: 01-Home or Self Care       Discharge Instructions     Diet - low sodium heart healthy   Complete by: As directed    Increase activity slowly   Complete by: As directed       Allergies as of 05/26/2022       Reactions   Colesevelam Hcl    scotomas   Daucus [place]    Penicillins Itching        Medication List     TAKE these medications    Accu-Chek Softclix Lancets lancets   alendronate 70 MG tablet Commonly known as: FOSAMAX Take with a full glass of water on an empty stomach. What changed:  how much to take when to take this    amLODipine 5 MG tablet Commonly known as: NORVASC Take 1 tablet (5 mg total) by mouth daily.   atorvastatin 80 MG tablet Commonly known as: LIPITOR Take 1 tablet (80 mg total) by mouth daily.   Baqsimi One Pack 3 MG/DOSE Powd Generic drug: Glucagon One spray into nose in case of severe hypoglycemia   blood glucose meter kit and supplies   Calcium Carb-Cholecalciferol 600-10 MG-MCG Tabs Take 2 tablets by mouth daily.   [employer] Alcohol Swabs Pads USE 4 TIMES DAILY TO WIPE SKIN BEFORE USING INSULIN   donepezil 10 MG disintegrating tablet Commonly known as: ARICEPT ODT TAKE 1 TABLET BY MOUTH EVERYDAY AT BEDTIME   hydrALAZINE 10 MG tablet Commonly known as: APRESOLINE TAKE 1 TABLET (10 MG TOTAL) BY MOUTH 3 (THREE) TIMES DAILY AS NEEDED. BP ABOVE 150/90   insulin aspart 100 UNIT/ML FlexPen Commonly known as: NOVOLOG 8-12 Units 3 (three) times daily with meals.   letrozole 2.5 MG tablet Commonly known as: FEMARA Take 1 tablet (2.5 mg total) by mouth daily.   losartan-hydrochlorothiazide 100-12.5 MG tablet Commonly known as: HYZAAR Take 1 tablet by mouth daily.   traMADol 50 MG tablet Commonly known as: Ultram Take 1 tablet (50 mg total) by mouth every 6 (six) hours as needed.  [name] FlexTouch 100 UNIT/ML FlexTouch Pen Generic drug: insulin degludec Inject 22 Units into the skin daily. qam        Follow-up Information     Herbert Pun, MD Follow up on 06/02/2022.   Specialty: General Surgery Why: at 9:45am for evaluation of left chest wound and drain Contact information: [place] [address] [phone]

## 2022-05-26 NOTE — Discharge Instructions (Signed)
  Diet: Resume home heart healthy regular diet.   Activity: No heavy lifting >20 pounds (children, pets, laundry, garbage) or strenuous activity until follow-up, but light activity and walking are encouraged. Do not drive or drink alcohol if taking narcotic pain medications.  Wound care: May shower with soapy water and pat dry (do not rub incisions), but no baths or submerging incision underwater until follow-up. (no swimming)   Chart Drain output at least 2 ties per day.   Medications: Resume all home medications. For mild to moderate pain: acetaminophen (Tylenol) or ibuprofen (if no kidney disease). Combining Tylenol with alcohol can substantially increase your risk of causing liver disease. Narcotic pain medications, if prescribed, can be used for severe pain, though may cause nausea, constipation, and drowsiness.  If you do not need the narcotic pain medication, you do not need to fill the prescription.  Call office [phone]) at any time if any questions, worsening pain, fevers/chills, bleeding, drainage from incision site, or other concerns.

## 2022-05-26 NOTE — TOC Initial Note (Signed)
Transition of Care [facility]) - Initial/Assessment Note    Patient Details  Name: [name] MRN: [id number] Date of Birth: 09-Dec-1940  Transition of Care [facility]) CM/SW Contact:    Beverly Sessions, RN Phone Number: 05/26/2022, 12:13 PM  Clinical Narrative:                   Transition of Care [facility]) Screening Note   Patient Details  Name: [name] Date of Birth: [birth year]   Transition of Care [facility]) CM/SW Contact:    Beverly Sessions, RN Phone Number: 05/26/2022, 12:14 PM    Transition of Care Department [facility]) has reviewed patient and no TOC needs have been identified at this time. We will continue to monitor patient advancement through interdisciplinary progression rounds. If new patient transition needs arise, please place a TOC consult.         Patient Goals and CMS Choice            Expected Discharge Plan and Services         Expected Discharge Date: 05/26/22                                    Prior Living Arrangements/Services                       Activities of Daily Living Home Assistive Devices/Equipment: Gilford Rile (specify type), Eyeglasses, CBG Meter ADL Screening (condition at time of admission) Patient's cognitive ability adequate to safely complete daily activities?: Yes Is the patient deaf or have difficulty hearing?: No Does the patient have difficulty seeing, even when wearing glasses/contacts?: No (wears glasses) Does the patient have difficulty concentrating, remembering, or making decisions?: Yes Patient able to express need for assistance with ADLs?: Yes Does the patient have difficulty dressing or bathing?: Yes Independently performs ADLs?: Yes (appropriate for developmental age) Does the patient have difficulty walking or climbing stairs?: Yes Weakness of Legs: Both Weakness of Arms/Hands: Both  Permission Sought/Granted                  Emotional Assessment              Admission diagnosis:   Breast cancer ([place]) [C50.919] Patient Active Problem List   Diagnosis Date Noted   Breast cancer ([place]) 05/25/2022   Genetic testing 05/01/2022   Malignant neoplasm of areola of left breast in female, estrogen receptor positive ([place]) 04/14/2022   Mild protein-calorie malnutrition ([place]) 07/29/2021   Stage 3a chronic kidney disease ([place]) 07/29/2021   Gait instability 07/29/2021   Type 1 diabetes mellitus with hyperglycemia, with long-term current use of insulin ([place]) 04/28/2018   Atherosclerosis of aorta ([place]) 04/01/2016   Aortic sclerosis 09/19/2014   Hypertension associated with diabetes ([place]) 09/19/2014   Calculus of gallbladder 09/19/2014   Carpal tunnel syndrome 09/19/2014   Detached retina 09/19/2014   Type 1 diabetes mellitus with microalbuminuria ([place]) 09/19/2014   Dyslipidemia 09/19/2014   Hypophosphatemia 09/19/2014   Mild cognitive impairment with memory loss 09/19/2014   Microalbuminuria 09/19/2014   OP (osteoporosis) 09/19/2014   Menopause 09/19/2014   Ptosis of eyelid 09/19/2014   Hypoglycemia associated with diabetes ([place]) 10/24/2013   Urge incontinence 08/01/2013   Hyperlipidemia due to type 1 diabetes mellitus ([place]) 08/01/2013   Vitamin D deficiency 02/24/2007   PCP:  Steele Sizer, MD Pharmacy:   [id number] [address], [place]  [address] [place] [address] Phone: [phone] Fax: [phone]  [id number] [place], [place] [address] [place] [address] Phone: [phone] Fax: [phone]     Social Determinants of Health (SDOH) Social History: [place]: No Food Insecurity (05/25/2022)  Housing: Low Risk  (05/25/2022)  Transportation Needs: No Transportation Needs (05/25/2022)  Utilities: Not At Risk (05/25/2022)  Alcohol Screen: Low Risk  (09/08/2019)  Depression (PHQ2-9): Low Risk  (04/14/2022)  Financial Resource Strain: Low Risk  (05/14/2022)  Physical  Activity: Inactive (05/14/2022)  Social Connections: Socially Integrated (05/14/2022)  Stress: No Stress Concern Present (05/14/2022)  Tobacco Use: Low Risk  (05/26/2022)   SDOH Interventions:     Readmission Risk Interventions     No data to display

## 2022-05-27 ENCOUNTER — Other Ambulatory Visit: Payer: Self-pay | Admitting: Anatomic Pathology & Clinical Pathology

## 2022-05-27 ENCOUNTER — Encounter: Payer: Self-pay | Admitting: Oncology

## 2022-05-27 DIAGNOSIS — E1059 Type 1 diabetes mellitus with other circulatory complications: Secondary | ICD-10-CM | POA: Diagnosis not present

## 2022-05-27 DIAGNOSIS — Z483 Aftercare following surgery for neoplasm: Secondary | ICD-10-CM | POA: Diagnosis not present

## 2022-05-27 DIAGNOSIS — I252 Old myocardial infarction: Secondary | ICD-10-CM | POA: Diagnosis not present

## 2022-05-27 DIAGNOSIS — C773 Secondary and unspecified malignant neoplasm of axilla and upper limb lymph nodes: Secondary | ICD-10-CM | POA: Diagnosis not present

## 2022-05-27 DIAGNOSIS — C50012 Malignant neoplasm of nipple and areola, left female breast: Secondary | ICD-10-CM | POA: Diagnosis not present

## 2022-05-27 DIAGNOSIS — E1022 Type 1 diabetes mellitus with diabetic chronic kidney disease: Secondary | ICD-10-CM | POA: Diagnosis not present

## 2022-05-27 DIAGNOSIS — N1831 Chronic kidney disease, stage 3a: Secondary | ICD-10-CM | POA: Diagnosis not present

## 2022-05-27 DIAGNOSIS — F03C0 Unspecified dementia, severe, without behavioral disturbance, psychotic disturbance, mood disturbance, and anxiety: Secondary | ICD-10-CM | POA: Diagnosis not present

## 2022-05-27 DIAGNOSIS — Z4803 Encounter for change or removal of drains: Secondary | ICD-10-CM | POA: Diagnosis not present

## 2022-05-27 LAB — SURGICAL PATHOLOGY

## 2022-05-28 ENCOUNTER — Encounter: Payer: Self-pay | Admitting: *Deleted

## 2022-05-28 ENCOUNTER — Telehealth: Payer: Self-pay | Admitting: *Deleted

## 2022-05-28 DIAGNOSIS — I252 Old myocardial infarction: Secondary | ICD-10-CM | POA: Diagnosis not present

## 2022-05-28 DIAGNOSIS — C50012 Malignant neoplasm of nipple and areola, left female breast: Secondary | ICD-10-CM | POA: Diagnosis not present

## 2022-05-28 DIAGNOSIS — Z483 Aftercare following surgery for neoplasm: Secondary | ICD-10-CM | POA: Diagnosis not present

## 2022-05-28 DIAGNOSIS — C773 Secondary and unspecified malignant neoplasm of axilla and upper limb lymph nodes: Secondary | ICD-10-CM | POA: Diagnosis not present

## 2022-05-28 DIAGNOSIS — E1022 Type 1 diabetes mellitus with diabetic chronic kidney disease: Secondary | ICD-10-CM | POA: Diagnosis not present

## 2022-05-28 DIAGNOSIS — N1831 Chronic kidney disease, stage 3a: Secondary | ICD-10-CM | POA: Diagnosis not present

## 2022-05-28 DIAGNOSIS — Z4803 Encounter for change or removal of drains: Secondary | ICD-10-CM | POA: Diagnosis not present

## 2022-05-28 DIAGNOSIS — E1059 Type 1 diabetes mellitus with other circulatory complications: Secondary | ICD-10-CM | POA: Diagnosis not present

## 2022-05-28 DIAGNOSIS — F03C0 Unspecified dementia, severe, without behavioral disturbance, psychotic disturbance, mood disturbance, and anxiety: Secondary | ICD-10-CM | POA: Diagnosis not present

## 2022-05-28 NOTE — Telephone Encounter (Signed)
Received FMLA for daughter Arrie Aran, form completed and sent for doctor signature

## 2022-05-28 NOTE — Progress Notes (Signed)
Ms. Doring daughter Arrie Aran called needing to reschedule her appointments with Dr. Janese Banks and Dr. Baruch Gouty.  She also needed a letter stating her mom is currently under treatment for breast cancer and her staging information.

## 2022-05-29 ENCOUNTER — Telehealth (INDEPENDENT_AMBULATORY_CARE_PROVIDER_SITE_OTHER): Payer: Medicare HMO

## 2022-05-29 VITALS — Ht 62.0 in | Wt 135.0 lb

## 2022-05-29 DIAGNOSIS — Z0000 Encounter for general adult medical examination without abnormal findings: Secondary | ICD-10-CM

## 2022-05-29 NOTE — Telephone Encounter (Signed)
FMLA completed and faxed. My Chart message sent to patient to inform of this

## 2022-05-29 NOTE — Progress Notes (Signed)
I connected with  [name], he daughter and husband on 05/29/22 by a video and audio enabled telemedicine application and verified that I am speaking with the correct person using two identifiers.  Patient Location: Home  Provider Location: Office/Clinic  I discussed the limitations of evaluation and management by telemedicine. The patient expressed understanding and agreed to proceed.  Subjective:   [name] is a 82 y.o. female who presents for Medicare Annual (Subsequent) preventive examination.  Review of Systems           Objective:    Today's Vitals   05/29/22 1418  Weight: 135 lb (61.2 kg)  Height: 5\' 2"  (1.575 m)   Body mass index is 24.69 kg/m.     05/29/2022    2:28 PM 05/25/2022    7:52 PM 05/25/2022    9:17 AM 05/15/2022    3:36 PM 05/04/2022    1:29 PM 04/14/2022    2:52 PM 05/12/2019    7:59 PM  Advanced Directives  Does Patient Have a Medical Advance Directive? Yes Yes Yes Yes Yes Yes No  Type of Corporate treasurer of [place];Living will [place];Living will [place];Living will    Does patient want to make changes to medical advance directive?  No - Guardian declined    Yes (ED - Information included in AVS)   Copy of [place] in Chart?  No - copy requested  No - copy requested     Would patient like information on creating a medical advance directive?   Yes (Inpatient - patient requests chaplain consult to create a medical advance directive)   Yes (ED - Information included in AVS) No - Patient declined    Current Medications (verified) Outpatient Encounter Medications as of 05/29/2022  Medication Sig   Accu-Chek Softclix Lancets lancets    alendronate (FOSAMAX) 70 MG tablet Take with a full glass of water on an empty stomach. (Patient taking differently: 70 mg once a week. Take with a full glass of water on an empty stomach.)   amLODipine  (NORVASC) 5 MG tablet Take 1 tablet (5 mg total) by mouth daily.   atorvastatin (LIPITOR) 80 MG tablet Take 1 tablet (80 mg total) by mouth daily.   blood glucose meter kit and supplies    Calcium Carb-Cholecalciferol 600-10 MG-MCG TABS Take 2 tablets by mouth daily.   CVS ALCOHOL SWABS PADS USE 4 TIMES DAILY TO WIPE SKIN BEFORE USING INSULIN   donepezil (ARICEPT ODT) 10 MG disintegrating tablet TAKE 1 TABLET BY MOUTH EVERYDAY AT BEDTIME   Glucagon (BAQSIMI ONE PACK) 3 MG/DOSE POWD    hydrALAZINE (APRESOLINE) 10 MG tablet TAKE 1 TABLET (10 MG TOTAL) BY MOUTH 3 (THREE) TIMES DAILY AS NEEDED. BP ABOVE 150/90   insulin aspart (NOVOLOG) 100 UNIT/ML FlexPen 8-12 Units 3 (three) times daily with meals.   insulin degludec (TRESIBA FLEXTOUCH) 100 UNIT/ML FlexTouch Pen Inject 22 Units into the skin daily. qam   letrozole (FEMARA) 2.5 MG tablet Take 1 tablet (2.5 mg total) by mouth daily.   losartan-hydrochlorothiazide (HYZAAR) 100-12.5 MG tablet Take 1 tablet by mouth daily.   traMADol (ULTRAM) 50 MG tablet Take 1 tablet (50 mg total) by mouth every 6 (six) hours as needed.   No facility-administered encounter medications on file as of 05/29/2022.    Allergies (verified) Colesevelam hcl, Daucus carota, and Penicillins   History: Past Medical History:  Diagnosis Date  Arthritis    Atherosclerosis of aorta (HCC)    CKD (chronic kidney disease) stage 3, GFR 30-59 ml/min (HCC)    Dementia (HCC)    Diabetes mellitus without complication (HCC)    Dysrhythmia    Heart murmur    Hypertension    Type 1 diabetes ([place])    Past Surgical History:  Procedure Laterality Date   AXILLARY LYMPH NODE BIOPSY Left 04/07/2022   bx done, hydromarker placed, path pending   BREAST BIOPSY Left 04/07/2022   [place] bx mass, ribbon marker, path pending   BREAST BIOPSY Left 04/07/2022   [place] LT BREAST BX W LOC DEV 1ST LESION IMG BX [place] US GUIDE 04/07/2022 [facility]   BREAST EXCISIONAL BIOPSY Left    neg    BREAST SURGERY  1970   EYE SURGERY     detached retina   MASTECTOMY MODIFIED RADICAL Left 05/25/2022   Procedure: MASTECTOMY MODIFIED RADICAL;  Surgeon: Herbert Pun, MD;  Location: [facility] ORS;  Service: General;  Laterality: Left;   Family History  Problem Relation Age of Onset   Alzheimer's disease Mother    Diabetes Mother    Arthritis Mother    Diabetes Father    Hypertension Father    Hyperlipidemia Father    Stroke Brother    Multiple sclerosis Daughter    Social History   Socioeconomic History   Marital status: Married    Spouse name: [name]   Number of children: 3   Years of education: Not on file   Highest education level: 12th grade  Occupational History   Occupation: Retired  Tobacco Use   Smoking status: Never   Smokeless tobacco: Never   Tobacco comments:    smoking cessation materials not required  Vaping Use   Vaping Use: Never used  Substance and Sexual Activity   Alcohol use: No    Alcohol/week: 0.0 standard drinks of alcohol   Drug use: No   Sexual activity: Not Currently  Other Topics Concern   Not on file  Social History Narrative   Not on file   Social Determinants of Health   Financial Resource Strain: Low Risk  (05/14/2022)   Overall Financial Resource Strain (CARDIA)    Difficulty of Paying Living Expenses: Not very hard  Food Insecurity: No Food Insecurity (05/25/2022)   Hunger Vital Sign    Worried About Running Out of Food in the Last Year: Never true    Ran Out of Food in the Last Year: Never true  Transportation Needs: No Transportation Needs (05/25/2022)   PRAPARE - Hydrologist (Medical): No    Lack of Transportation (Non-Medical): No  Physical Activity: Inactive (05/14/2022)   Exercise Vital Sign    Days of Exercise per Week: 0 days    Minutes of Exercise per Session: 0 min  Stress: No Stress Concern Present (05/14/2022)   [place]     Feeling of Stress : Only a little  Social Connections: Socially Integrated (05/14/2022)   Social Connection and Isolation Panel [NHANES]    Frequency of Communication with Friends and Family: Never    Frequency of Social Gatherings with Friends and Family: More than three times a week    Attends Religious Services: 1 to 4 times per year    Active Member of [employer] or Organizations: Yes    Attends Archivist Meetings: Never    Marital Status: Married    Tobacco Counseling Counseling  given: Not Answered Tobacco comments: smoking cessation materials not required   Clinical Intake:  Pre-visit preparation completed: Yes  Pain : No/denies pain   BMI - recorded: 24.69 Nutritional Status: BMI of 19-24  Normal Nutritional Risks: None Diabetes: Yes CBG done?: No (bs 91 at home) Did pt. bring in CBG monitor from home?: No  How often do you need to have someone help you when you read instructions, pamphlets, or other written materials from your doctor or pharmacy?: 1 - Never  Diabetic?yes  Interpreter Needed?: No  Comments: lives w/husband and grandson Information entered by :: B.Glynn Freas,LPN   Activities of Daily Living    05/29/2022    2:28 PM 05/25/2022    3:00 PM  In your present state of health, do you have any difficulty performing the following activities:  Hearing? 0   Vision? 0   Difficulty concentrating or making decisions? 1   Walking or climbing stairs? 1   Dressing or bathing? 1   Doing errands, shopping? 1 0  Preparing Food and eating ? Y   Using the Toilet? Y   In the past six months, have you accidently leaked urine? Y   Do you have problems with loss of bowel control? Y   Managing your Medications? Y   Managing your Finances? Y   Housekeeping or managing your Housekeeping? Y     Patient Care Team: Steele Sizer, MD as PCP - General (Family Medicine) Eulogio Bear, MD as Consulting Physician (Ophthalmology) Lonia Farber, MD as  Consulting Physician (Internal Medicine) Daiva Huge, RN as Oncology Nurse Navigator  Indicate any recent Medical Services you may have received from other than [facility] providers in the past year (date may be approximate).     Assessment:   This is a routine wellness examination for [place].  Hearing/Vision screen Hearing Screening - Comments:: Adequate hearing Vision Screening - Comments:: Adequate vision w/glasses  Dietary issues and exercise activities discussed:     Goals Addressed             This Visit's Progress    DIET - INCREASE WATER INTAKE   Not on track    Recommend to drink at least 6-8 8oz glasses of water per day.     Exercise 3x per week (30 min per time)   Not on track    Recommend to exercise 3x per week (30 min per time)       Depression Screen    04/14/2022    3:01 PM 03/24/2022    2:14 PM 01/27/2022    1:12 PM 07/29/2021    1:21 PM 01/16/2021    1:47 PM 12/30/2020    1:44 PM 04/09/2020    3:40 PM  PHQ 2/9 Scores  PHQ - 2 Score 0 0 0 0 0 0 0  PHQ- 9 Score 0 0 0        Fall Risk    03/24/2022    2:14 PM 01/27/2022    1:11 PM 07/29/2021    1:21 PM 01/16/2021    1:06 PM 12/30/2020    1:44 PM  [place] in the past year? 0 1 0 0 0  Number falls in past yr: 0 0 0 0 0  Injury with Fall? 0 0 0  0  Risk for fall due to :  Impaired balance/gait Impaired balance/gait Medication side effect;Impaired balance/gait Impaired balance/gait  Follow up  Falls prevention discussed Falls prevention discussed Falls prevention discussed Falls  prevention discussed    FALL RISK PREVENTION PERTAINING TO THE HOME:  Any stairs in or around the home? Yes  If so, are there any without handrails? Yes  Home free of loose throw rugs in walkways, pet beds, electrical cords, etc? Yes  Adequate lighting in your home to reduce risk of falls? Yes   ASSISTIVE DEVICES UTILIZED TO PREVENT FALLS:  Life alert? No  Use of a cane, walker or w/c? Yes rollater Grab bars in  the bathroom? No  Shower chair or bench in shower? No  Elevated toilet seat or a handicapped toilet? Yes    Cognitive Function:        10/07/2017    2:37 PM 09/28/2016    3:37 PM  6CIT Screen  What Year? 4 points 0 points  What month? 3 points 0 points  What time? 0 points 3 points  Count back from 20 0 points 0 points  Months in reverse 0 points 0 points  Repeat phrase 6 points 10 points  Total Score 13 points 13 points    Immunizations Immunization History  Administered Date(s) Administered   Fluad Quad(high Dose 65+) 11/15/2018, 12/21/2019, 01/27/2022   Hepatitis B, PED/ADOLESCENT 04/15/2006, 05/13/2006, 08/24/2006   Influenza, High Dose Seasonal PF 04/01/2016, 12/23/2016, 12/28/2017, 12/30/2020   Influenza,inj,Quad PF,6+ Mos 01/01/2015   Influenza-Unspecified 11/27/2013, 12/08/2018   Moderna Sars-Covid-2 Vaccination 04/21/2019, 05/19/2019, 01/02/2020   PFIZER(Purple Top)SARS-COV-2 Vaccination 05/05/2019, 05/31/2019   Pfizer Covid-19 Vaccine Bivalent Booster 4yrs & up 08/28/2020   Pneumococcal Conjugate-13 03/29/2014, 04/12/2014   Pneumococcal Polysaccharide-23 10/03/2009, 06/14/2015   Tdap 10/29/2011    TDAP status: Up to date  Flu Vaccine status: Up to date  Pneumococcal vaccine status: Up to date  Covid-19 vaccine status: Completed vaccines  Qualifies for Shingles Vaccine? Yes   Zostavax completed No   Shingrix Completed?: No.    Education has been provided regarding the importance of this vaccine. Patient has been advised to call insurance company to determine out of pocket expense if they have not yet received this vaccine. Advised may also receive vaccine at local pharmacy or Health Dept. Verbalized acceptance and understanding.  Screening Tests Health Maintenance  Topic Date Due   Zoster Vaccines- Shingrix (1 of 2) Never done   DTaP/Tdap/Td (2 - Td or Tdap) 10/28/2021   COVID-19 Vaccine (7 - 2023-24 season) 11/07/2021   OPHTHALMOLOGY EXAM  02/13/2022    HEMOGLOBIN A1C  03/26/2022   FOOT EXAM  07/30/2022   Diabetic kidney evaluation - Urine ACR  01/28/2023   Diabetic kidney evaluation - eGFR measurement  05/26/2023   Medicare Annual Wellness (AWV)  05/29/2023   Pneumonia Vaccine 3+ Years old  Completed   INFLUENZA VACCINE  Completed   DEXA SCAN  Completed   Hepatitis C Screening  Completed   HPV VACCINES  Aged Out   COLONOSCOPY (Pts 45-54yrs Insurance coverage will need to be confirmed)  Discontinued    Health Maintenance  Health Maintenance Due  Topic Date Due   Zoster Vaccines- Shingrix (1 of 2) Never done   DTaP/Tdap/Td (2 - Td or Tdap) 10/28/2021   COVID-19 Vaccine (7 - 2023-24 season) 11/07/2021   OPHTHALMOLOGY EXAM  02/13/2022   HEMOGLOBIN A1C  03/26/2022    Colorectal cancer screening: No longer required.   Mammogram status: No longer required due to age.  Bone Density status: Completed yes. Results reflect: Bone density results: OSTEOPOROSIS. Repeat every 3 years.  Lung Cancer Screening: (Low Dose CT Chest recommended if Age 72-80  years, 30 pack-year currently smoking OR have quit w/in 15years.) does not qualify.   Lung Cancer Screening Referral: no  Additional Screening:  Hepatitis C Screening: does not qualify; Completed yes  Vision Screening: Recommended annual ophthalmology exams for early detection of glaucoma and other disorders of the eye. Is the patient up to date with their annual eye exam?  Yes  Who is the provider or what is the name of the office in which the patient attends annual eye exams? Dr Edison Pace If pt is not established with a provider, would they like to be referred to a provider to establish care? No .   Dental Screening: Recommended annual dental exams for proper oral hygiene  Community Resource Referral / Chronic Care Management: CRR required this visit?  No   CCM required this visit?  No    Plan:     I have personally reviewed and noted the following in the patient's chart:    Medical and social history Use of alcohol, tobacco or illicit drugs  Current medications and supplements including opioid prescriptions. Patient is not currently taking opioid prescriptions. Functional ability and status Nutritional status Physical activity Advanced directives List of other physicians Hospitalizations, surgeries, and ER visits in previous 12 months Vitals Screenings to include cognitive, depression, and falls Referrals and appointments  In addition, I have reviewed and discussed with patient certain preventive protocols, quality metrics, and best practice recommendations. A written personalized care plan for preventive services as well as general preventive health recommendations were provided to patient.    Roger Shelter, LPN   [id number]   Nurse Notes: pt is s/p left mastectomy on 05/25/2022 after breast Ca diagnosis. Pt states she has no pain and is eating her lunch very well feeding herself as prepared by family. Pt presents with daughter/[name] and husband/[name] on the video as they are her caregivers. She is incontinent and needs assistance with everything except feeding herself. Pt has dementia and unable to do/respond to mini-cog questions. They have no questions or concerns at this visit. They indicate she is doing well at this time. Daughter states they have Mych[facility] assess for information and appts. She is aware of upcoming appts.

## 2022-06-02 ENCOUNTER — Telehealth: Payer: Self-pay | Admitting: Family Medicine

## 2022-06-02 ENCOUNTER — Ambulatory Visit: Payer: Medicare HMO | Admitting: Oncology

## 2022-06-02 ENCOUNTER — Institutional Professional Consult (permissible substitution): Payer: Medicare HMO | Admitting: Radiation Oncology

## 2022-06-02 NOTE — Telephone Encounter (Signed)
Contacted Eureka Wuollet to schedule their annual wellness visit. Appointment made for 07/17/2022.  [place] Direct Dial: [phone]

## 2022-06-03 DIAGNOSIS — I252 Old myocardial infarction: Secondary | ICD-10-CM | POA: Diagnosis not present

## 2022-06-03 DIAGNOSIS — Z4803 Encounter for change or removal of drains: Secondary | ICD-10-CM | POA: Diagnosis not present

## 2022-06-03 DIAGNOSIS — E1059 Type 1 diabetes mellitus with other circulatory complications: Secondary | ICD-10-CM | POA: Diagnosis not present

## 2022-06-03 DIAGNOSIS — Z483 Aftercare following surgery for neoplasm: Secondary | ICD-10-CM | POA: Diagnosis not present

## 2022-06-03 DIAGNOSIS — C50012 Malignant neoplasm of nipple and areola, left female breast: Secondary | ICD-10-CM | POA: Diagnosis not present

## 2022-06-03 DIAGNOSIS — F03C0 Unspecified dementia, severe, without behavioral disturbance, psychotic disturbance, mood disturbance, and anxiety: Secondary | ICD-10-CM | POA: Diagnosis not present

## 2022-06-03 DIAGNOSIS — N1831 Chronic kidney disease, stage 3a: Secondary | ICD-10-CM | POA: Diagnosis not present

## 2022-06-03 DIAGNOSIS — E1022 Type 1 diabetes mellitus with diabetic chronic kidney disease: Secondary | ICD-10-CM | POA: Diagnosis not present

## 2022-06-03 DIAGNOSIS — C773 Secondary and unspecified malignant neoplasm of axilla and upper limb lymph nodes: Secondary | ICD-10-CM | POA: Diagnosis not present

## 2022-06-04 DIAGNOSIS — C50012 Malignant neoplasm of nipple and areola, left female breast: Secondary | ICD-10-CM | POA: Diagnosis not present

## 2022-06-04 DIAGNOSIS — C773 Secondary and unspecified malignant neoplasm of axilla and upper limb lymph nodes: Secondary | ICD-10-CM | POA: Diagnosis not present

## 2022-06-04 DIAGNOSIS — F03C0 Unspecified dementia, severe, without behavioral disturbance, psychotic disturbance, mood disturbance, and anxiety: Secondary | ICD-10-CM | POA: Diagnosis not present

## 2022-06-04 DIAGNOSIS — N1831 Chronic kidney disease, stage 3a: Secondary | ICD-10-CM | POA: Diagnosis not present

## 2022-06-04 DIAGNOSIS — E1022 Type 1 diabetes mellitus with diabetic chronic kidney disease: Secondary | ICD-10-CM | POA: Diagnosis not present

## 2022-06-04 DIAGNOSIS — I252 Old myocardial infarction: Secondary | ICD-10-CM | POA: Diagnosis not present

## 2022-06-04 DIAGNOSIS — E1059 Type 1 diabetes mellitus with other circulatory complications: Secondary | ICD-10-CM | POA: Diagnosis not present

## 2022-06-04 DIAGNOSIS — Z483 Aftercare following surgery for neoplasm: Secondary | ICD-10-CM | POA: Diagnosis not present

## 2022-06-04 DIAGNOSIS — Z4803 Encounter for change or removal of drains: Secondary | ICD-10-CM | POA: Diagnosis not present

## 2022-06-08 DIAGNOSIS — C50012 Malignant neoplasm of nipple and areola, left female breast: Secondary | ICD-10-CM | POA: Diagnosis not present

## 2022-06-08 DIAGNOSIS — Z4803 Encounter for change or removal of drains: Secondary | ICD-10-CM | POA: Diagnosis not present

## 2022-06-08 DIAGNOSIS — Z483 Aftercare following surgery for neoplasm: Secondary | ICD-10-CM | POA: Diagnosis not present

## 2022-06-08 DIAGNOSIS — I252 Old myocardial infarction: Secondary | ICD-10-CM | POA: Diagnosis not present

## 2022-06-08 DIAGNOSIS — E1059 Type 1 diabetes mellitus with other circulatory complications: Secondary | ICD-10-CM | POA: Diagnosis not present

## 2022-06-08 DIAGNOSIS — F03C0 Unspecified dementia, severe, without behavioral disturbance, psychotic disturbance, mood disturbance, and anxiety: Secondary | ICD-10-CM | POA: Diagnosis not present

## 2022-06-08 DIAGNOSIS — E1022 Type 1 diabetes mellitus with diabetic chronic kidney disease: Secondary | ICD-10-CM | POA: Diagnosis not present

## 2022-06-08 DIAGNOSIS — N1831 Chronic kidney disease, stage 3a: Secondary | ICD-10-CM | POA: Diagnosis not present

## 2022-06-08 DIAGNOSIS — C773 Secondary and unspecified malignant neoplasm of axilla and upper limb lymph nodes: Secondary | ICD-10-CM | POA: Diagnosis not present

## 2022-06-09 DIAGNOSIS — E1022 Type 1 diabetes mellitus with diabetic chronic kidney disease: Secondary | ICD-10-CM | POA: Diagnosis not present

## 2022-06-09 DIAGNOSIS — C50012 Malignant neoplasm of nipple and areola, left female breast: Secondary | ICD-10-CM | POA: Diagnosis not present

## 2022-06-09 DIAGNOSIS — C773 Secondary and unspecified malignant neoplasm of axilla and upper limb lymph nodes: Secondary | ICD-10-CM | POA: Diagnosis not present

## 2022-06-09 DIAGNOSIS — E1059 Type 1 diabetes mellitus with other circulatory complications: Secondary | ICD-10-CM | POA: Diagnosis not present

## 2022-06-09 DIAGNOSIS — I252 Old myocardial infarction: Secondary | ICD-10-CM | POA: Diagnosis not present

## 2022-06-09 DIAGNOSIS — N1831 Chronic kidney disease, stage 3a: Secondary | ICD-10-CM | POA: Diagnosis not present

## 2022-06-09 DIAGNOSIS — Z483 Aftercare following surgery for neoplasm: Secondary | ICD-10-CM | POA: Diagnosis not present

## 2022-06-09 DIAGNOSIS — F03C0 Unspecified dementia, severe, without behavioral disturbance, psychotic disturbance, mood disturbance, and anxiety: Secondary | ICD-10-CM | POA: Diagnosis not present

## 2022-06-09 DIAGNOSIS — Z4803 Encounter for change or removal of drains: Secondary | ICD-10-CM | POA: Diagnosis not present

## 2022-06-10 DIAGNOSIS — N1831 Chronic kidney disease, stage 3a: Secondary | ICD-10-CM | POA: Diagnosis not present

## 2022-06-10 DIAGNOSIS — I252 Old myocardial infarction: Secondary | ICD-10-CM | POA: Diagnosis not present

## 2022-06-10 DIAGNOSIS — Z4803 Encounter for change or removal of drains: Secondary | ICD-10-CM | POA: Diagnosis not present

## 2022-06-10 DIAGNOSIS — F03C0 Unspecified dementia, severe, without behavioral disturbance, psychotic disturbance, mood disturbance, and anxiety: Secondary | ICD-10-CM | POA: Diagnosis not present

## 2022-06-10 DIAGNOSIS — Z483 Aftercare following surgery for neoplasm: Secondary | ICD-10-CM | POA: Diagnosis not present

## 2022-06-10 DIAGNOSIS — C773 Secondary and unspecified malignant neoplasm of axilla and upper limb lymph nodes: Secondary | ICD-10-CM | POA: Diagnosis not present

## 2022-06-10 DIAGNOSIS — C50012 Malignant neoplasm of nipple and areola, left female breast: Secondary | ICD-10-CM | POA: Diagnosis not present

## 2022-06-10 DIAGNOSIS — E1022 Type 1 diabetes mellitus with diabetic chronic kidney disease: Secondary | ICD-10-CM | POA: Diagnosis not present

## 2022-06-10 DIAGNOSIS — E1059 Type 1 diabetes mellitus with other circulatory complications: Secondary | ICD-10-CM | POA: Diagnosis not present

## 2022-06-15 ENCOUNTER — Ambulatory Visit
Admission: RE | Admit: 2022-06-15 | Discharge: 2022-06-15 | Disposition: A | Discharge status: Home / Self Care | Payer: Medicare HMO | Source: Ambulatory Visit | Attending: Radiation Oncology | Admitting: Radiation Oncology

## 2022-06-15 ENCOUNTER — Inpatient Hospital Stay: Payer: Medicare HMO | Attending: Oncology | Admitting: Oncology

## 2022-06-15 ENCOUNTER — Encounter: Payer: Self-pay | Admitting: Oncology

## 2022-06-15 VITALS — BP 123/59 | HR 62 | Temp 96.1°F | Resp 16 | Wt 133.0 lb

## 2022-06-15 DIAGNOSIS — C50012 Malignant neoplasm of nipple and areola, left female breast: Secondary | ICD-10-CM | POA: Insufficient documentation

## 2022-06-15 DIAGNOSIS — Z79811 Long term (current) use of aromatase inhibitors: Secondary | ICD-10-CM | POA: Insufficient documentation

## 2022-06-15 DIAGNOSIS — Z7189 Other specified counseling: Secondary | ICD-10-CM | POA: Diagnosis not present

## 2022-06-15 DIAGNOSIS — R011 Cardiac murmur, unspecified: Secondary | ICD-10-CM | POA: Insufficient documentation

## 2022-06-15 DIAGNOSIS — Z79899 Other long term (current) drug therapy: Secondary | ICD-10-CM | POA: Insufficient documentation

## 2022-06-15 DIAGNOSIS — E109 Type 1 diabetes mellitus without complications: Secondary | ICD-10-CM | POA: Insufficient documentation

## 2022-06-15 DIAGNOSIS — I10 Essential (primary) hypertension: Secondary | ICD-10-CM | POA: Insufficient documentation

## 2022-06-15 DIAGNOSIS — N183 Chronic kidney disease, stage 3 unspecified: Secondary | ICD-10-CM | POA: Insufficient documentation

## 2022-06-15 DIAGNOSIS — I700 Atherosclerosis of aorta: Secondary | ICD-10-CM | POA: Insufficient documentation

## 2022-06-15 DIAGNOSIS — Z170 Estrogen receptor positive status [ER+]: Secondary | ICD-10-CM | POA: Insufficient documentation

## 2022-06-15 DIAGNOSIS — F0390 Unspecified dementia without behavioral disturbance: Secondary | ICD-10-CM | POA: Insufficient documentation

## 2022-06-15 DIAGNOSIS — M129 Arthropathy, unspecified: Secondary | ICD-10-CM | POA: Insufficient documentation

## 2022-06-15 DIAGNOSIS — M810 Age-related osteoporosis without current pathological fracture: Secondary | ICD-10-CM | POA: Insufficient documentation

## 2022-06-15 NOTE — Progress Notes (Signed)
Hematology/Oncology Consult note [facility]  Telephone:([phone] [phone]  Patient Care Team: Alba Cory, MD as PCP - General (Family Medicine) [place] Crane, MD as Consulting Physician (Ophthalmology) Sherlon Handing, MD as Consulting Physician (Internal Medicine) Hulen Luster, RN as Oncology Nurse Navigator   Name of the patient: [name]  [id number]  11/13/1940   Date of visit: 06/15/22  Diagnosis-pathological prognostic stage IIa invasive mammary carcinoma of the left breast pT2 N1a M0 ER/PR positive HER2 negative  Chief complaint/ Reason for visit-discuss final pathology results and further management  Heme/Onc history: Patient is a 82 year old female who underwent a bilateral diagnostic mammogram for palpable mass in the left breast in January 2024.  Mammogram showed hypoechoic mass measuring 3.1 x 1.9 x 3.4 cm in the left retroareolar breast at 2 o'clock position with invasion into the dermis and retroareolar breast.  2 enlarged left axillary lymph nodes.  No mammographic evidence of malignancy in the right breast.  Patient had a left breast biopsy as well as biopsy of the left axillary lymph which showed invasive mammary carcinoma grade 310 mm in the primary breast specimen and 7 mm in the lymph node.  Tumor was ER greater than 90% positive, PR 60% positive and HER2 negative.   Patient is here with her daughter today who is her healthcare power of attorney.  Patient has advanced dementia and is dependent on her family members for ADLs including dressing and ambulation to some extent.  She is incontinent.  She has not had any recent infections or hospitalizations.   Final pathology showed 3.2 cm grade 3 invasive mammary carcinoma with negative margins.  3 out of 15 lymph nodes were positive for malignancy.  Interval history-patient is here with her daughter today.  She is nonverbal due to her underlying dementia  ECOG  PS- 2-3 Pain scale-unable to assess   Review of systems- ROS    Allergies  Allergen Reactions   Colesevelam Hcl     scotomas   Daucus Carota    Penicillins Itching     Past Medical History:  Diagnosis Date   Arthritis    Atherosclerosis of aorta    CKD (chronic kidney disease) stage 3, GFR 30-59 ml/min    Dementia    Diabetes mellitus without complication    Dysrhythmia    Heart murmur    Hypertension    Type 1 diabetes      Past Surgical History:  Procedure Laterality Date   AXILLARY LYMPH NODE BIOPSY Left 04/07/2022   bx done, hydromarker placed, path pending   BREAST BIOPSY Left 04/07/2022   [place] bx mass, ribbon marker, path pending   BREAST BIOPSY Left 04/07/2022   [place] LT BREAST BX W LOC DEV 1ST LESION IMG BX SPEC US GUIDE 04/07/2022 [facility]   BREAST EXCISIONAL BIOPSY Left    neg   BREAST SURGERY  1970   EYE SURGERY     detached retina   MASTECTOMY MODIFIED RADICAL Left 05/25/2022   Procedure: MASTECTOMY MODIFIED RADICAL;  Surgeon: Carolan Shiver, MD;  Location: [facility] ORS;  Service: General;  Laterality: Left;    Social History   Socioeconomic History   Marital status: Married    Spouse name: Sherilyn Cooter   Number of children: 3   Years of education: Not on file   Highest education level: 12th grade  Occupational History   Occupation: Retired  Tobacco Use   Smoking status: Never   Smokeless tobacco: Never  Tobacco comments:    smoking cessation materials not required  Vaping Use   Vaping Use: Never used  Substance and Sexual Activity   Alcohol use: No    Alcohol/week: 0.0 standard drinks of alcohol   Drug use: No   Sexual activity: Not Currently  Other Topics Concern   Not on file  Social History Narrative   Not on file   Social Determinants of Health   Financial Resource Strain: Low Risk  (05/14/2022)   Overall Financial Resource Strain (CARDIA)    Difficulty of Paying Living Expenses: Not very hard  Food Insecurity: No Food  Insecurity (05/25/2022)   Hunger Vital Sign    Worried About Running Out of Food in the Last Year: Never true    Ran Out of Food in the Last Year: Never true  Transportation Needs: No Transportation Needs (05/25/2022)   PRAPARE - Administrator, Civil ServiceTransportation    Lack of Transportation (Medical): No    Lack of Transportation (Non-Medical): No  Physical Activity: Inactive (05/14/2022)   Exercise Vital Sign    Days of Exercise per Week: 0 days    Minutes of Exercise per Session: 0 min  Stress: No Stress Concern Present (05/14/2022)   [employer] - Occupational Stress Questionnaire    Feeling of Stress : Only a little  Social Connections: Socially Integrated (05/14/2022)   Social Connection and Isolation Panel [NHANES]    Frequency of Communication with Friends and Family: Never    Frequency of Social Gatherings with Friends and Family: More than three times a week    Attends Religious Services: 1 to 4 times per year    Active Member of [employer]ubs or Organizations: Yes    Attends BankerClub or Organization Meetings: Never    Marital Status: Married  Catering managerntimate Partner Violence: Not At Risk (05/25/2022)   Humiliation, Afraid, Rape, and Kick questionnaire    Fear of Current or Ex-Partner: No    Emotionally Abused: No    Physically Abused: No    Sexually Abused: No    Family History  Problem Relation Age of Onset   Alzheimer's disease Mother    Diabetes Mother    Arthritis Mother    Diabetes Father    Hypertension Father    Hyperlipidemia Father    Stroke Brother    Multiple sclerosis Daughter      Current Outpatient Medications:    Accu-Chek Softclix Lancets lancets, , Disp: , Rfl:    alendronate (FOSAMAX) 70 MG tablet, Take with a full glass of water on an empty stomach. (Patient taking differently: 70 mg once a week. Take with a full glass of water on an empty stomach.), Disp: 12 tablet, Rfl: 1   amLODipine (NORVASC) 5 MG tablet, Take 1 tablet (5 mg total) by mouth daily., Disp: 90  tablet, Rfl: 1   atorvastatin (LIPITOR) 80 MG tablet, Take 1 tablet (80 mg total) by mouth daily., Disp: 90 tablet, Rfl: 1   blood glucose meter kit and supplies, , Disp: , Rfl:    Calcium Carb-Cholecalciferol 600-10 MG-MCG TABS, Take 2 tablets by mouth daily., Disp: , Rfl:    [employer] ALCOHOL SWABS PADS, USE 4 TIMES DAILY TO WIPE SKIN BEFORE USING INSULIN, Disp: 400 each, Rfl: 2   donepezil (ARICEPT ODT) 10 MG disintegrating tablet, TAKE 1 TABLET BY MOUTH EVERYDAY AT BEDTIME, Disp: 90 tablet, Rfl: 1   Glucagon (BAQSIMI ONE PACK) 3 MG/DOSE POWD, , Disp: , Rfl:    hydrALAZINE (APRESOLINE) 10 MG tablet,  TAKE 1 TABLET (10 MG TOTAL) BY MOUTH 3 (THREE) TIMES DAILY AS NEEDED. BP ABOVE 150/90, Disp: 270 tablet, Rfl: 0   insulin aspart (NOVOLOG) 100 UNIT/ML FlexPen, 8-12 Units 3 (three) times daily with meals., Disp: , Rfl:    insulin degludec (TRESIBA FLEXTOUCH) 100 UNIT/ML FlexTouch Pen, Inject 22 Units into the skin daily. qam, Disp: , Rfl:    letrozole (FEMARA) 2.5 MG tablet, Take 1 tablet (2.5 mg total) by mouth daily., Disp: 30 tablet, Rfl: 3   losartan-hydrochlorothiazide (HYZAAR) 100-12.5 MG tablet, Take 1 tablet by mouth daily., Disp: 90 tablet, Rfl: 1   traMADol (ULTRAM) 50 MG tablet, Take 1 tablet (50 mg total) by mouth every 6 (six) hours as needed., Disp: 20 tablet, Rfl: 0  Physical exam:  Vitals:   06/15/22 1003  BP: (!) 123/59  Pulse: 62  Resp: 16  Temp: (!) 96.1 F (35.6 C)  TempSrc: Tympanic  Weight: 133 lb (60.3 kg)   Physical Exam Constitutional:      Comments: Sitting in a wheelchair.  Appears in no acute distress  Cardiovascular:     Rate and Rhythm: Normal rate and regular rhythm.     Heart sounds: Normal heart sounds.  Pulmonary:     Effort: Pulmonary effort is normal.  Abdominal:     General: Bowel sounds are normal.     Palpations: Abdomen is soft.  Skin:    General: Skin is warm and dry.  Neurological:     Mental Status: She is alert and oriented to person, place,  and time.   Chest wall exam: Patient is s/p left mastectomy without reconstruction.  Mastectomy wound is healing well.  No signs of infection.     Latest Ref Rng & Units 05/26/2022    6:53 AM  CMP  Glucose 70 - 99 mg/dL 409   BUN 8 - 23 mg/dL 12   Creatinine 8.11 - 1.00 mg/dL 9.14   Sodium 782 - 956 mmol/L 139   Potassium 3.5 - 5.1 mmol/L 3.2   Chloride 98 - 111 mmol/L 112   CO2 22 - 32 mmol/L 21   Calcium 8.9 - 10.3 mg/dL 7.4       Latest Ref Rng & Units 05/26/2022    6:53 AM  CBC  WBC 4.0 - 10.5 K/uL 8.9   Hemoglobin 12.0 - 15.0 g/dL 21.3   Hematocrit 08.6 - 46.0 % 30.3   Platelets 150 - 400 K/uL 172      Assessment and plan- Patient is a 82 y.o. female with pathological prognostic stage IIb invasive mammary carcinoma of the left breast pT2 N1a M0 ER/PR positive HER2 negative here to discuss further management  Discussed results of mastectomy which showed a 3.2 cm tumor with negative margins.  3 out of 15 lymph nodes positive for malignancy.  Tumor is ER/PR positive and HER2 negative.  She is meeting with radiation oncology to discuss adjuvant radiation.  I do not recommend Oncotype testing or adjuvant chemotherapy for her given her underlying dementia, potential for side effects with chemotherapy and her ability to sit through chemotherapy treatments.  Patient will continue with adjuvant letrozole at this time.  She was started on letrozole even prior to her surgery.  Patient does have baseline osteoporosis and I discussed risks and benefits of both letrozole and tamoxifen with her daughter.  We are continuing with letrozole since patient is on bisphosphonates for her osteoporosis and will keep a close eye on her bone density.  Patient would  qualify for adjuvant Verzenio but given the potential for side effects and need to monitor frequent blood counts I am not offering that to her.  I will see her back in 3 months with labs.  Treatment will be given with a curative intent    Cancer Staging  Malignant neoplasm of areola of left breast in female, estrogen receptor positive Staging form: Breast, AJCC 8th Edition - Clinical stage from 04/14/2022: Stage IIB (cT2, cN1(f), cM0, G3, ER+, PR+, HER2-) - Signed by Creig Hines, MD on 04/14/2022 Stage prefix: Initial diagnosis Method of lymph node assessment: Core biopsy Histologic grading system: 3 grade system - Pathologic stage from 06/15/2022: Stage IIA (pT2, pN1a, cM0, G3, ER+, PR+, HER2-) - Signed by Creig Hines, MD on 06/15/2022 Stage prefix: Initial diagnosis Multigene prognostic tests performed: None Histologic grading system: 3 grade system     Visit Diagnosis 1. Malignant neoplasm of areola of left breast in female, estrogen receptor positive   2. Goals of care, counseling/discussion   3. Age related osteoporosis, unspecified pathological fracture presence      Dr. Owens Shark, MD, MPH [facility] [phone] 06/15/2022 1:09 PM

## 2022-06-15 NOTE — Progress Notes (Signed)
Pt and daughter in for follow up, denies any concerns today. 

## 2022-06-15 NOTE — Consult Note (Signed)
NEW PATIENT EVALUATION  Name: [name]  MRN: [id number]  Date:   06/15/2022     DOB: 1940/10/27   This 82 y.o. female patient presents to the clinic for initial evaluation of stage IIb (pT2 pN1 M0) ER/PR positive HER2 negative invasive mammary carcinoma left breast status post left modified radical mastectomy.  REFERRING PHYSICIAN: Alba Cory, MD  CHIEF COMPLAINT:  Chief Complaint  Patient presents with   Breast Cancer    DIAGNOSIS: The encounter diagnosis was Malignant neoplasm of areola of left breast in female, estrogen receptor positive.   PREVIOUS INVESTIGATIONS:  Pathology reports reviewed Mammogram and ultrasound reviewed Clinical notes reviewed Case presented at cancer conference  HPI: Patient is an 82 year old female with dementia who presented with a palpable mass in the left breast.  Mammogram and ultrasound demonstrated a 3.4 cm mass in the left retroareolar region concerning for malignancy.  There is also sonographic and mammographic concerns for dermal areolar involvement.  There is also 2 enlarged left axillary nodes.  PET scan confirmed left breast mass with ipsilateral axillary nodal metastasis.  Biopsy of her breast and lymph nodes both showed metastatic mammary carcinoma in her lymph nodes as well as high-grade 3 in the retroareolar region.  She underwent a left modified radical mastectomy and axillary lymph node dissection showing a 3.2 cm area of overall grade 3 invasive mammary carcinoma of no special type.  Tumor involved carcinoma directly invading the dermis without skin ulceration there is also lymphatic and vascular invasion present.  Margins were clear at 1 cm for invasive component.  There were 3 lymph nodes with macro metastatic disease.  16 lymph nodes were examined.  Tumor was ER/PR positive HER2/neu not overexpressed.  Patient is seen today she is doing well she definitely has dementia is accompanied by her daughter.  She has been declined for  systemic treatment by medical oncology based on her age and mental status.  PLANNED TREATMENT REGIMEN: Left chest wall and peripheral lymphatic radiation  PAST MEDICAL HISTORY:  has a past medical history of Arthritis, Atherosclerosis of aorta, CKD (chronic kidney disease) stage 3, GFR 30-59 ml/min, Dementia, Diabetes mellitus without complication, Dysrhythmia, Heart murmur, Hypertension, and Type 1 diabetes.    PAST SURGICAL HISTORY:  Past Surgical History:  Procedure Laterality Date   AXILLARY LYMPH NODE BIOPSY Left 04/07/2022   bx done, hydromarker placed, path pending   BREAST BIOPSY Left 04/07/2022   [place] bx mass, ribbon marker, path pending   BREAST BIOPSY Left 04/07/2022   [place] LT BREAST BX W LOC DEV 1ST LESION IMG BX SPEC US GUIDE 04/07/2022 [facility]   BREAST EXCISIONAL BIOPSY Left    neg   BREAST SURGERY  1970   EYE SURGERY     detached retina   MASTECTOMY MODIFIED RADICAL Left 05/25/2022   Procedure: MASTECTOMY MODIFIED RADICAL;  Surgeon: Carolan Shiver, MD;  Location: [facility] OR[facility];  Service: General;  Laterality: Left;    FAMILY HISTORY: family history includes Alzheimer's disease in her mother; Arthritis in her mother; Diabetes in her father and mother; Hyperlipidemia in her father; Hypertension in her father; Multiple sclerosis in her daughter; Stroke in her brother.  SOCIAL HISTORY:  reports that she has never smoked. She has never used smokeless tobacco. She reports that she does not drink alcohol and does not use drugs.  ALLERGIES: Colesevelam hcl, Daucus carota, and Penicillins  MEDICATIONS:  Current Outpatient Medications  Medication Sig Dispense Refill   Accu-Chek Softclix Lancets lancets  alendronate (FOSAMAX) 70 MG tablet Take with a full glass of water on an empty stomach. (Patient taking differently: 70 mg once a week. Take with a full glass of water on an empty stomach.) 12 tablet 1   amLODipine (NORVASC) 5 MG tablet Take 1 tablet (5 mg total) by  mouth daily. 90 tablet 1   atorvastatin (LIPITOR) 80 MG tablet Take 1 tablet (80 mg total) by mouth daily. 90 tablet 1   blood glucose meter kit and supplies      Calcium Carb-Cholecalciferol 600-10 MG-MCG TABS Take 2 tablets by mouth daily.     CVS ALCOHOL SWABS PADS USE 4 TIMES DAILY TO WIPE SKIN BEFORE USING INSULIN 400 each 2   donepezil (ARICEPT ODT) 10 MG disintegrating tablet TAKE 1 TABLET BY MOUTH EVERYDAY AT BEDTIME 90 tablet 1   Glucagon (BAQSIMI ONE PACK) 3 MG/DOSE POWD      hydrALAZINE (APRESOLINE) 10 MG tablet TAKE 1 TABLET (10 MG TOTAL) BY MOUTH 3 (THREE) TIMES DAILY AS NEEDED. BP ABOVE 150/90 270 tablet 0   insulin aspart (NOVOLOG) 100 UNIT/ML FlexPen 8-12 Units 3 (three) times daily with meals.     insulin degludec (TRESIBA FLEXTOUCH) 100 UNIT/ML FlexTouch Pen Inject 22 Units into the skin daily. qam     letrozole (FEMARA) 2.5 MG tablet Take 1 tablet (2.5 mg total) by mouth daily. 30 tablet 3   losartan-hydrochlorothiazide (HYZAAR) 100-12.5 MG tablet Take 1 tablet by mouth daily. 90 tablet 1   traMADol (ULTRAM) 50 MG tablet Take 1 tablet (50 mg total) by mouth every 6 (six) hours as needed. 20 tablet 0   No current facility-administered medications for this encounter.    ECOG PERFORMANCE STATUS:  0 - Asymptomatic  REVIEW OF SYSTEMS: Patient does have dementia. Patient denies any weight loss, fatigue, weakness, fever, chills or night sweats. Patient denies any loss of vision, blurred vision. Patient denies any ringing  of the ears or hearing loss. No irregular heartbeat. Patient denies heart murmur or history of fainting. Patient denies any chest pain or pain radiating to her upper extremities. Patient denies any shortness of breath, difficulty breathing at night, cough or hemoptysis. Patient denies any swelling in the lower legs. Patient denies any nausea vomiting, vomiting of blood, or coffee ground material in the vomitus. Patient denies any stomach pain. Patient states has had  normal bowel movements no significant constipation or diarrhea. Patient denies any dysuria, hematuria or significant nocturia. Patient denies any problems walking, swelling in the joints or loss of balance. Patient denies any skin changes, loss of hair or loss of weight. Patient denies any excessive worrying or anxiety or significant depression. Patient denies any problems with insomnia. Patient denies excessive thirst, polyuria, polydipsia. Patient denies any swollen glands, patient denies easy bruising or easy bleeding. Patient denies any recent infections, allergies or URI. Patient "s visual fields have not changed significantly in recent time.   PHYSICAL EXAM: There were no vitals taken for this visit. Patient is status post left modified radical mastectomy incision still is healing somewhat in the lateral portion of the scar towards the axilla.  No evidence of chest wall mass or nodularity is noted.  Right breast is free of dominant mass.  No axillary or supraclavicular adenopathy is identified.  Patient is wheelchair-bound obviously with dementia.  Well-developed well-nourished patient in NAD. HEENT reveals PERLA, EOMI, discs not visualized.  Oral cavity is clear. No oral mucosal lesions are identified. Neck is clear without evidence of cervical or  supraclavicular adenopathy. Lungs are clear to A&P. Cardiac examination is essentially unremarkable with regular rate and rhythm without murmur rub or thrill. Abdomen is benign with no organomegaly or masses noted. Motor sensory and DTR levels are equal and symmetric in the upper and lower extremities. Cranial nerves II through XII are grossly intact. Proprioception is intact. No peripheral adenopathy or edema is identified. No motor or sensory levels are noted. Crude visual fields are within normal range.  LABORATORY DATA: Pathology reports reviewed    RADIOLOGY RESULTS: Mammogram ultrasound and PET CT scan reviewed compatible with above-stated  findings   IMPRESSION: Stage IIb invasive mammary carcinoma of the left breast with dermal involvement and multiple axillary lymph nodes involved with macro metastatic disease in 82 year old female with dementia who will not receive systemic treatment.  PLAN: At this time elect to attempt simulation on this patient.  I believe she will be cooperative as she has been another test to be able to undergo radiation therapy to her left chest wall and peripheral lymphatics.  I would plan on delivering 50 Gray in 25 fractions to both areas.  Based on clear margins will not boost her scar.  Risks and benefits of treatment including skin reaction fatigue alteration blood counts possible development of lymphedema of her left upper extremity all were explained in detail to the patient's daughter.  She comprehends my treatment plan well.  I will allow 2 more weeks for healing of her scar.  I have personally 7 ordered CT simulation.  Patient also will benefit possibly from endocrine therapy after completion of radiation.  I would like to take this opportunity to thank you for allowing me to participate in the care of your patient.Carmina Miller.  Nikira Kushnir, MD

## 2022-06-17 DIAGNOSIS — C50012 Malignant neoplasm of nipple and areola, left female breast: Secondary | ICD-10-CM | POA: Diagnosis not present

## 2022-06-17 DIAGNOSIS — I252 Old myocardial infarction: Secondary | ICD-10-CM | POA: Diagnosis not present

## 2022-06-17 DIAGNOSIS — C773 Secondary and unspecified malignant neoplasm of axilla and upper limb lymph nodes: Secondary | ICD-10-CM | POA: Diagnosis not present

## 2022-06-17 DIAGNOSIS — F03C0 Unspecified dementia, severe, without behavioral disturbance, psychotic disturbance, mood disturbance, and anxiety: Secondary | ICD-10-CM | POA: Diagnosis not present

## 2022-06-17 DIAGNOSIS — N1831 Chronic kidney disease, stage 3a: Secondary | ICD-10-CM | POA: Diagnosis not present

## 2022-06-17 DIAGNOSIS — E1022 Type 1 diabetes mellitus with diabetic chronic kidney disease: Secondary | ICD-10-CM | POA: Diagnosis not present

## 2022-06-17 DIAGNOSIS — Z483 Aftercare following surgery for neoplasm: Secondary | ICD-10-CM | POA: Diagnosis not present

## 2022-06-17 DIAGNOSIS — Z4803 Encounter for change or removal of drains: Secondary | ICD-10-CM | POA: Diagnosis not present

## 2022-06-17 DIAGNOSIS — E1059 Type 1 diabetes mellitus with other circulatory complications: Secondary | ICD-10-CM | POA: Diagnosis not present

## 2022-06-19 ENCOUNTER — Ambulatory Visit: Payer: Medicare HMO | Admitting: Oncology

## 2022-06-19 NOTE — Telephone Encounter (Unsigned)
Copied from CRM [id number]. Topic: Quick Communication - Home Health Verbal Orders >> Jun 19, 2022  8:30 AM Everette C wrote: Caller/Agency: Delorise Jackson Number: [phone] Requesting OT/PT/Skilled Nursing/Social Work/Speech Therapy: OT Frequency: 1w6

## 2022-06-19 NOTE — Telephone Encounter (Signed)
Per Dr. Sowles verbal orders given 

## 2022-06-22 DIAGNOSIS — E1059 Type 1 diabetes mellitus with other circulatory complications: Secondary | ICD-10-CM | POA: Diagnosis not present

## 2022-06-22 DIAGNOSIS — I252 Old myocardial infarction: Secondary | ICD-10-CM | POA: Diagnosis not present

## 2022-06-22 DIAGNOSIS — E1022 Type 1 diabetes mellitus with diabetic chronic kidney disease: Secondary | ICD-10-CM | POA: Diagnosis not present

## 2022-06-22 DIAGNOSIS — C50012 Malignant neoplasm of nipple and areola, left female breast: Secondary | ICD-10-CM | POA: Diagnosis not present

## 2022-06-22 DIAGNOSIS — Z483 Aftercare following surgery for neoplasm: Secondary | ICD-10-CM | POA: Diagnosis not present

## 2022-06-22 DIAGNOSIS — N1831 Chronic kidney disease, stage 3a: Secondary | ICD-10-CM | POA: Diagnosis not present

## 2022-06-22 DIAGNOSIS — Z4803 Encounter for change or removal of drains: Secondary | ICD-10-CM | POA: Diagnosis not present

## 2022-06-22 DIAGNOSIS — F03C0 Unspecified dementia, severe, without behavioral disturbance, psychotic disturbance, mood disturbance, and anxiety: Secondary | ICD-10-CM | POA: Diagnosis not present

## 2022-06-22 DIAGNOSIS — C773 Secondary and unspecified malignant neoplasm of axilla and upper limb lymph nodes: Secondary | ICD-10-CM | POA: Diagnosis not present

## 2022-06-23 ENCOUNTER — Ambulatory Visit
Admission: EM | Admit: 2022-06-23 | Discharge status: Against Medical Advice | Payer: Medicare HMO | Source: Home / Self Care

## 2022-06-23 ENCOUNTER — Ambulatory Visit: Payer: Self-pay

## 2022-06-23 ENCOUNTER — Other Ambulatory Visit: Payer: Self-pay

## 2022-06-23 ENCOUNTER — Emergency Department: Payer: Medicare HMO

## 2022-06-23 ENCOUNTER — Inpatient Hospital Stay
Admission: EM | Admit: 2022-06-23 | Discharge: 2022-06-28 | DRG: 418 | Disposition: A | Discharge status: Other Acute Inpatient Hospital | Payer: Medicare HMO | Attending: Internal Medicine | Admitting: Internal Medicine

## 2022-06-23 DIAGNOSIS — K9186 Retained cholelithiasis following cholecystectomy: Secondary | ICD-10-CM | POA: Diagnosis not present

## 2022-06-23 DIAGNOSIS — Z820 Family history of epilepsy and other diseases of the nervous system: Secondary | ICD-10-CM

## 2022-06-23 DIAGNOSIS — F0390 Unspecified dementia without behavioral disturbance: Secondary | ICD-10-CM | POA: Diagnosis not present

## 2022-06-23 DIAGNOSIS — Z170 Estrogen receptor positive status [ER+]: Secondary | ICD-10-CM | POA: Diagnosis not present

## 2022-06-23 DIAGNOSIS — F02C0 Dementia in other diseases classified elsewhere, severe, without behavioral disturbance, psychotic disturbance, mood disturbance, and anxiety: Secondary | ICD-10-CM | POA: Diagnosis present

## 2022-06-23 DIAGNOSIS — E441 Mild protein-calorie malnutrition: Secondary | ICD-10-CM | POA: Diagnosis not present

## 2022-06-23 DIAGNOSIS — Z794 Long term (current) use of insulin: Secondary | ICD-10-CM

## 2022-06-23 DIAGNOSIS — Y838 Other surgical procedures as the cause of abnormal reaction of the patient, or of later complication, without mention of misadventure at the time of the procedure: Secondary | ICD-10-CM | POA: Diagnosis not present

## 2022-06-23 DIAGNOSIS — R112 Nausea with vomiting, unspecified: Secondary | ICD-10-CM | POA: Diagnosis not present

## 2022-06-23 DIAGNOSIS — C50912 Malignant neoplasm of unspecified site of left female breast: Secondary | ICD-10-CM | POA: Diagnosis present

## 2022-06-23 DIAGNOSIS — N1831 Chronic kidney disease, stage 3a: Secondary | ICD-10-CM | POA: Diagnosis not present

## 2022-06-23 DIAGNOSIS — Z8349 Family history of other endocrine, nutritional and metabolic diseases: Secondary | ICD-10-CM

## 2022-06-23 DIAGNOSIS — Z4803 Encounter for change or removal of drains: Secondary | ICD-10-CM | POA: Diagnosis not present

## 2022-06-23 DIAGNOSIS — I152 Hypertension secondary to endocrine disorders: Secondary | ICD-10-CM | POA: Diagnosis present

## 2022-06-23 DIAGNOSIS — K8012 Calculus of gallbladder with acute and chronic cholecystitis without obstruction: Principal | ICD-10-CM | POA: Diagnosis present

## 2022-06-23 DIAGNOSIS — E1159 Type 2 diabetes mellitus with other circulatory complications: Secondary | ICD-10-CM | POA: Diagnosis not present

## 2022-06-23 DIAGNOSIS — M1990 Unspecified osteoarthritis, unspecified site: Secondary | ICD-10-CM | POA: Diagnosis present

## 2022-06-23 DIAGNOSIS — C50919 Malignant neoplasm of unspecified site of unspecified female breast: Secondary | ICD-10-CM | POA: Diagnosis not present

## 2022-06-23 DIAGNOSIS — Z823 Family history of stroke: Secondary | ICD-10-CM | POA: Diagnosis not present

## 2022-06-23 DIAGNOSIS — Z79811 Long term (current) use of aromatase inhibitors: Secondary | ICD-10-CM | POA: Diagnosis not present

## 2022-06-23 DIAGNOSIS — K8020 Calculus of gallbladder without cholecystitis without obstruction: Secondary | ICD-10-CM | POA: Diagnosis present

## 2022-06-23 DIAGNOSIS — Z79899 Other long term (current) drug therapy: Secondary | ICD-10-CM

## 2022-06-23 DIAGNOSIS — E785 Hyperlipidemia, unspecified: Secondary | ICD-10-CM | POA: Diagnosis present

## 2022-06-23 DIAGNOSIS — Y733 Surgical instruments, materials and gastroenterology and urology devices (including sutures) associated with adverse incidents: Secondary | ICD-10-CM | POA: Diagnosis not present

## 2022-06-23 DIAGNOSIS — Z9049 Acquired absence of other specified parts of digestive tract: Secondary | ICD-10-CM | POA: Diagnosis not present

## 2022-06-23 DIAGNOSIS — Z483 Aftercare following surgery for neoplasm: Secondary | ICD-10-CM | POA: Diagnosis not present

## 2022-06-23 DIAGNOSIS — G309 Alzheimer's disease, unspecified: Secondary | ICD-10-CM | POA: Diagnosis present

## 2022-06-23 DIAGNOSIS — F03C0 Unspecified dementia, severe, without behavioral disturbance, psychotic disturbance, mood disturbance, and anxiety: Secondary | ICD-10-CM | POA: Diagnosis not present

## 2022-06-23 DIAGNOSIS — Z833 Family history of diabetes mellitus: Secondary | ICD-10-CM

## 2022-06-23 DIAGNOSIS — Z8261 Family history of arthritis: Secondary | ICD-10-CM | POA: Diagnosis not present

## 2022-06-23 DIAGNOSIS — C773 Secondary and unspecified malignant neoplasm of axilla and upper limb lymph nodes: Secondary | ICD-10-CM | POA: Diagnosis not present

## 2022-06-23 DIAGNOSIS — Z7983 Long term (current) use of bisphosphonates: Secondary | ICD-10-CM

## 2022-06-23 DIAGNOSIS — M810 Age-related osteoporosis without current pathological fracture: Secondary | ICD-10-CM | POA: Diagnosis present

## 2022-06-23 DIAGNOSIS — Z8249 Family history of ischemic heart disease and other diseases of the circulatory system: Secondary | ICD-10-CM

## 2022-06-23 DIAGNOSIS — R7989 Other specified abnormal findings of blood chemistry: Secondary | ICD-10-CM | POA: Diagnosis not present

## 2022-06-23 DIAGNOSIS — E1065 Type 1 diabetes mellitus with hyperglycemia: Secondary | ICD-10-CM | POA: Diagnosis present

## 2022-06-23 DIAGNOSIS — E1069 Type 1 diabetes mellitus with other specified complication: Secondary | ICD-10-CM | POA: Diagnosis present

## 2022-06-23 DIAGNOSIS — I700 Atherosclerosis of aorta: Secondary | ICD-10-CM | POA: Diagnosis present

## 2022-06-23 DIAGNOSIS — E1022 Type 1 diabetes mellitus with diabetic chronic kidney disease: Secondary | ICD-10-CM | POA: Diagnosis not present

## 2022-06-23 DIAGNOSIS — E876 Hypokalemia: Secondary | ICD-10-CM | POA: Diagnosis not present

## 2022-06-23 DIAGNOSIS — K8050 Calculus of bile duct without cholangitis or cholecystitis without obstruction: Secondary | ICD-10-CM

## 2022-06-23 DIAGNOSIS — I252 Old myocardial infarction: Secondary | ICD-10-CM | POA: Diagnosis not present

## 2022-06-23 DIAGNOSIS — Z9012 Acquired absence of left breast and nipple: Secondary | ICD-10-CM

## 2022-06-23 DIAGNOSIS — K5900 Constipation, unspecified: Secondary | ICD-10-CM | POA: Diagnosis not present

## 2022-06-23 DIAGNOSIS — Z880 Allergy status to penicillin: Secondary | ICD-10-CM | POA: Diagnosis not present

## 2022-06-23 DIAGNOSIS — Z888 Allergy status to other drugs, medicaments and biological substances status: Secondary | ICD-10-CM | POA: Diagnosis not present

## 2022-06-23 DIAGNOSIS — R1011 Right upper quadrant pain: Secondary | ICD-10-CM | POA: Diagnosis present

## 2022-06-23 DIAGNOSIS — C50012 Malignant neoplasm of nipple and areola, left female breast: Secondary | ICD-10-CM | POA: Diagnosis not present

## 2022-06-23 DIAGNOSIS — E1059 Type 1 diabetes mellitus with other circulatory complications: Secondary | ICD-10-CM | POA: Diagnosis not present

## 2022-06-23 LAB — COMPREHENSIVE METABOLIC PANEL
ALT: 123 U/L — ABNORMAL HIGH (ref 0–44)
AST: 232 U/L — ABNORMAL HIGH (ref 15–41)
Albumin: 3.5 g/dL (ref 3.5–5.0)
Alkaline Phosphatase: 122 U/L (ref 38–126)
Anion gap: 11 (ref 5–15)
BUN: 20 mg/dL (ref 8–23)
CO2: 25 mmol/L (ref 22–32)
Calcium: 9.4 mg/dL (ref 8.9–10.3)
Chloride: 100 mmol/L (ref 98–111)
Creatinine, Ser: 0.72 mg/dL (ref 0.44–1.00)
GFR, Estimated: >60 mL/min (ref >60)
Glucose, Bld: 249 mg/dL — ABNORMAL HIGH (ref 70–99)
Potassium: 3.7 mmol/L (ref 3.5–5.1)
Sodium: 136 mmol/L (ref 135–145)
Total Bilirubin: 1.5 mg/dL — ABNORMAL HIGH (ref 0.3–1.2)
Total Protein: 7.1 g/dL (ref 6.5–8.1)

## 2022-06-23 LAB — CBC
HCT: 36.5 % (ref 36.0–46.0)
Hemoglobin: 12.1 g/dL (ref 12.0–15.0)
MCH: 29.6 pg (ref 26.0–34.0)
MCHC: 33.2 g/dL (ref 30.0–36.0)
MCV: 89.2 fL (ref 80.0–100.0)
Platelets: 243 10*3/uL (ref 150–400)
RBC: 4.09 MIL/uL (ref 3.87–5.11)
RDW: 13.7 % (ref 11.5–15.5)
WBC: 9.6 10*3/uL (ref 4.0–10.5)
nRBC: 0 % (ref 0.0–0.2)

## 2022-06-23 LAB — LIPASE, BLOOD: Lipase: 56 U/L — ABNORMAL HIGH (ref 11–51)

## 2022-06-23 MED ORDER — INSULIN ASPART 100 UNIT/ML IJ SOLN
6.0000 [IU] | Freq: Once | INTRAMUSCULAR | Status: AC
  Administered 2022-06-24: 6 [IU] via SUBCUTANEOUS
  Filled 2022-06-23: qty 1

## 2022-06-23 NOTE — ED Triage Notes (Signed)
Pt to ED for emesis today, also had right sided abd pain today.  Recent left sided mastectomy.  Hx dementia, pt denies pain at this time, daughter reports had abd pain earlier at [facility].  Pt in NAD

## 2022-06-23 NOTE — Telephone Encounter (Signed)
  Chief Complaint: abdominal pain Symptoms: R sided abdominal pain, 8/10, vomiting x 2 Frequency: today around 3pm when sat down to eat lunch Pertinent Negatives: NA Disposition: [] ED /[x] Urgent Care (no appt availability in office) / [] Appointment(In office/virtual)/ []  [facility]/ [] Home Care/ [] Refused Recommended Disposition /[] [place] [facility]/ []  Follow-up with PCP Additional Notes: pt's daughter calling to report above sx. Advised UC or ED. Daughter preferred UC, held spot for UC around 530.   Reason for Disposition  [1] SEVERE pain AND [2] age > 60 years  Answer Assessment - Initial Assessment Questions 1. LOCATION: "Where does it hurt?"      R sided abdomen  3. ONSET: "When did the pain begin?" (e.g., minutes, hours or days ago)      Today around 3 when sitting down to eat lunch  5. PATTERN "Does the pain come and go, or is it constant?"    - If it comes and goes: "How long does it last?" "Do you have pain now?"     (Note: Comes and goes means the pain is intermittent. It goes away completely between bouts.)    - If constant: "Is it getting better, staying the same, or getting worse?"      (Note: Constant means the pain never goes away completely; most serious pain is constant and gets worse.)      Constant  6. SEVERITY: "How bad is the pain?"  (e.g., Scale 1-10; mild, moderate, or severe)    - MILD (1-3): Doesn't interfere with normal activities, abdomen soft and not tender to touch.     - MODERATE (4-7): Interferes with normal activities or awakens from sleep, abdomen tender to touch.     - SEVERE (8-10): Excruciating pain, doubled over, unable to do any normal activities.       8/10 10. OTHER SYMPTOMS: "Do you have any other symptoms?" (e.g., back pain, diarrhea, fever, urination pain, vomiting)       Vomiting x 2  Protocols used: Abdominal Pain - Female-A-AH

## 2022-06-23 NOTE — ED Provider Triage Note (Signed)
Emergency Medicine Provider Triage Evaluation Note  [name] , a 82 y.o. female  was evaluated in triage.  Per daughter, patient complaining of RUQ abdominal pain with 2 episodes of vomiting after returning from endocrinology appointment this afternoon. Mastectomy in late March without complication. Last BM was yesterday.  Physical Exam  BP (!) 112/53   Pulse 60   Temp 98.1 F (36.7 C)   Resp 16   Wt 63.5 kg   SpO2 93%   BMI 25.61 kg/m  Gen:   Awake, no distress   Resp:  Normal effort  MSK:   Moves extremities without difficulty  Other:    Medical Decision Making  Medically screening exam initiated at 5:46 PM.  Appropriate orders placed.  [name] was informed that the remainder of the evaluation will be completed by another provider, this initial triage assessment does not replace that evaluation, and the importance of remaining in the ED until their evaluation is complete.  Abdominal pain protocol ordered.   Chinita Pester, FNP 06/23/22 1749

## 2022-06-24 ENCOUNTER — Encounter: Payer: Self-pay | Admitting: *Deleted

## 2022-06-24 ENCOUNTER — Inpatient Hospital Stay: Payer: Medicare HMO

## 2022-06-24 ENCOUNTER — Encounter: Payer: Self-pay | Admitting: Internal Medicine

## 2022-06-24 DIAGNOSIS — R7989 Other specified abnormal findings of blood chemistry: Secondary | ICD-10-CM | POA: Diagnosis not present

## 2022-06-24 DIAGNOSIS — K8012 Calculus of gallbladder with acute and chronic cholecystitis without obstruction: Secondary | ICD-10-CM | POA: Diagnosis present

## 2022-06-24 DIAGNOSIS — F0390 Unspecified dementia without behavioral disturbance: Secondary | ICD-10-CM | POA: Insufficient documentation

## 2022-06-24 DIAGNOSIS — E876 Hypokalemia: Secondary | ICD-10-CM | POA: Diagnosis not present

## 2022-06-24 DIAGNOSIS — I700 Atherosclerosis of aorta: Secondary | ICD-10-CM | POA: Diagnosis present

## 2022-06-24 DIAGNOSIS — Z888 Allergy status to other drugs, medicaments and biological substances status: Secondary | ICD-10-CM | POA: Diagnosis not present

## 2022-06-24 DIAGNOSIS — Z79811 Long term (current) use of aromatase inhibitors: Secondary | ICD-10-CM | POA: Diagnosis not present

## 2022-06-24 DIAGNOSIS — Y838 Other surgical procedures as the cause of abnormal reaction of the patient, or of later complication, without mention of misadventure at the time of the procedure: Secondary | ICD-10-CM | POA: Diagnosis not present

## 2022-06-24 DIAGNOSIS — G309 Alzheimer's disease, unspecified: Secondary | ICD-10-CM | POA: Diagnosis present

## 2022-06-24 DIAGNOSIS — K8050 Calculus of bile duct without cholangitis or cholecystitis without obstruction: Secondary | ICD-10-CM | POA: Diagnosis not present

## 2022-06-24 DIAGNOSIS — E441 Mild protein-calorie malnutrition: Secondary | ICD-10-CM | POA: Diagnosis not present

## 2022-06-24 DIAGNOSIS — Z9049 Acquired absence of other specified parts of digestive tract: Secondary | ICD-10-CM | POA: Diagnosis not present

## 2022-06-24 DIAGNOSIS — Z820 Family history of epilepsy and other diseases of the nervous system: Secondary | ICD-10-CM | POA: Diagnosis not present

## 2022-06-24 DIAGNOSIS — M810 Age-related osteoporosis without current pathological fracture: Secondary | ICD-10-CM | POA: Diagnosis present

## 2022-06-24 DIAGNOSIS — E785 Hyperlipidemia, unspecified: Secondary | ICD-10-CM | POA: Diagnosis present

## 2022-06-24 DIAGNOSIS — Z794 Long term (current) use of insulin: Secondary | ICD-10-CM | POA: Diagnosis not present

## 2022-06-24 DIAGNOSIS — E1159 Type 2 diabetes mellitus with other circulatory complications: Secondary | ICD-10-CM | POA: Diagnosis not present

## 2022-06-24 DIAGNOSIS — Z880 Allergy status to penicillin: Secondary | ICD-10-CM | POA: Diagnosis not present

## 2022-06-24 DIAGNOSIS — Z8249 Family history of ischemic heart disease and other diseases of the circulatory system: Secondary | ICD-10-CM | POA: Diagnosis not present

## 2022-06-24 DIAGNOSIS — Z79899 Other long term (current) drug therapy: Secondary | ICD-10-CM | POA: Diagnosis not present

## 2022-06-24 DIAGNOSIS — Y733 Surgical instruments, materials and gastroenterology and urology devices (including sutures) associated with adverse incidents: Secondary | ICD-10-CM | POA: Diagnosis not present

## 2022-06-24 DIAGNOSIS — Z7983 Long term (current) use of bisphosphonates: Secondary | ICD-10-CM | POA: Diagnosis not present

## 2022-06-24 DIAGNOSIS — Z9012 Acquired absence of left breast and nipple: Secondary | ICD-10-CM

## 2022-06-24 DIAGNOSIS — Z170 Estrogen receptor positive status [ER+]: Secondary | ICD-10-CM | POA: Diagnosis not present

## 2022-06-24 DIAGNOSIS — K9186 Retained cholelithiasis following cholecystectomy: Secondary | ICD-10-CM | POA: Diagnosis not present

## 2022-06-24 DIAGNOSIS — I152 Hypertension secondary to endocrine disorders: Secondary | ICD-10-CM | POA: Diagnosis present

## 2022-06-24 DIAGNOSIS — C50912 Malignant neoplasm of unspecified site of left female breast: Secondary | ICD-10-CM | POA: Diagnosis present

## 2022-06-24 DIAGNOSIS — R1011 Right upper quadrant pain: Secondary | ICD-10-CM | POA: Diagnosis present

## 2022-06-24 DIAGNOSIS — Z8261 Family history of arthritis: Secondary | ICD-10-CM | POA: Diagnosis not present

## 2022-06-24 DIAGNOSIS — E1065 Type 1 diabetes mellitus with hyperglycemia: Secondary | ICD-10-CM | POA: Diagnosis present

## 2022-06-24 DIAGNOSIS — F02C0 Dementia in other diseases classified elsewhere, severe, without behavioral disturbance, psychotic disturbance, mood disturbance, and anxiety: Secondary | ICD-10-CM | POA: Diagnosis present

## 2022-06-24 DIAGNOSIS — C50012 Malignant neoplasm of nipple and areola, left female breast: Secondary | ICD-10-CM | POA: Diagnosis not present

## 2022-06-24 DIAGNOSIS — E1069 Type 1 diabetes mellitus with other specified complication: Secondary | ICD-10-CM | POA: Diagnosis present

## 2022-06-24 DIAGNOSIS — K5900 Constipation, unspecified: Secondary | ICD-10-CM | POA: Diagnosis not present

## 2022-06-24 DIAGNOSIS — M1990 Unspecified osteoarthritis, unspecified site: Secondary | ICD-10-CM | POA: Diagnosis present

## 2022-06-24 DIAGNOSIS — Z823 Family history of stroke: Secondary | ICD-10-CM | POA: Diagnosis not present

## 2022-06-24 DIAGNOSIS — C50919 Malignant neoplasm of unspecified site of unspecified female breast: Secondary | ICD-10-CM | POA: Diagnosis not present

## 2022-06-24 DIAGNOSIS — K8020 Calculus of gallbladder without cholecystitis without obstruction: Secondary | ICD-10-CM | POA: Diagnosis not present

## 2022-06-24 LAB — HEPATIC FUNCTION PANEL
ALT: 126 U/L — ABNORMAL HIGH (ref 0–44)
ALT: 249 U/L — ABNORMAL HIGH (ref 0–44)
AST: 238 U/L — ABNORMAL HIGH (ref 15–41)
AST: 343 U/L — ABNORMAL HIGH (ref 15–41)
Albumin: 3.6 g/dL (ref 3.5–5.0)
Albumin: 3.6 g/dL (ref 3.5–5.0)
Alkaline Phosphatase: 120 U/L (ref 38–126)
Alkaline Phosphatase: 151 U/L — ABNORMAL HIGH (ref 38–126)
Bilirubin, Direct: 0.7 mg/dL — ABNORMAL HIGH (ref 0.0–0.2)
Bilirubin, Direct: 0.9 mg/dL — ABNORMAL HIGH (ref 0.0–0.2)
Indirect Bilirubin: 0.7 mg/dL (ref 0.3–0.9)
Indirect Bilirubin: 0.9 mg/dL (ref 0.3–0.9)
Total Bilirubin: 1.4 mg/dL — ABNORMAL HIGH (ref 0.3–1.2)
Total Bilirubin: 1.8 mg/dL — ABNORMAL HIGH (ref 0.3–1.2)
Total Protein: 7.2 g/dL (ref 6.5–8.1)
Total Protein: 7 g/dL (ref 6.5–8.1)

## 2022-06-24 LAB — URINALYSIS, ROUTINE W REFLEX MICROSCOPIC
Bilirubin Urine: NEGATIVE
Glucose, UA: 50 mg/dL — AB
Hgb urine dipstick: NEGATIVE
Ketones, ur: 5 mg/dL — AB
Leukocytes,Ua: NEGATIVE
Nitrite: NEGATIVE
Protein, ur: 30 mg/dL — AB
Specific Gravity, Urine: 1.02 (ref 1.005–1.030)
pH: 5 (ref 5.0–8.0)

## 2022-06-24 LAB — HEMOGLOBIN A1C
Hgb A1c MFr Bld: 6.5 % — ABNORMAL HIGH (ref 4.8–5.6)
Mean Plasma Glucose: 139.85 mg/dL

## 2022-06-24 LAB — GLUCOSE, CAPILLARY
Glucose-Capillary: 113 mg/dL — ABNORMAL HIGH (ref 70–99)
Glucose-Capillary: 195 mg/dL — ABNORMAL HIGH (ref 70–99)
Glucose-Capillary: 208 mg/dL — ABNORMAL HIGH (ref 70–99)
Glucose-Capillary: 63 mg/dL — ABNORMAL LOW (ref 70–99)
Glucose-Capillary: 94 mg/dL (ref 70–99)

## 2022-06-24 LAB — MAGNESIUM: Magnesium: 1.9 mg/dL (ref 1.7–2.4)

## 2022-06-24 LAB — LIPASE, BLOOD: Lipase: 31 U/L (ref 11–51)

## 2022-06-24 LAB — CBG MONITORING, ED
Glucose-Capillary: 282 mg/dL — ABNORMAL HIGH (ref 70–99)
Glucose-Capillary: 162 mg/dL — ABNORMAL HIGH (ref 70–99)

## 2022-06-24 LAB — PHOSPHORUS: Phosphorus: 4.6 mg/dL (ref 2.5–4.6)

## 2022-06-24 MED ORDER — ONDANSETRON HCL 4 MG PO TABS: 4.0000 mg | ORAL_TABLET | Freq: Four times a day (QID) | ORAL | Status: DC | PRN

## 2022-06-24 MED ORDER — HYDRALAZINE HCL 10 MG PO TABS: 10.0000 mg | ORAL_TABLET | Freq: Three times a day (TID) | ORAL | Status: DC | PRN

## 2022-06-24 MED ORDER — INSULIN ASPART 100 UNIT/ML IJ SOLN
0.0000 [IU] | INTRAMUSCULAR | Status: DC
  Administered 2022-06-24: 3 [IU] via SUBCUTANEOUS
  Filled 2022-06-24: qty 1

## 2022-06-24 MED ORDER — HYDRALAZINE HCL 50 MG PO TABS: 50.0000 mg | ORAL_TABLET | Freq: Three times a day (TID) | ORAL | Status: DC | PRN

## 2022-06-24 MED ORDER — ATORVASTATIN CALCIUM 20 MG PO TABS: 80.0000 mg | ORAL_TABLET | Freq: Every day | ORAL | Status: DC

## 2022-06-24 MED ORDER — AMLODIPINE BESYLATE 5 MG PO TABS
5.0000 mg | ORAL_TABLET | Freq: Every day | ORAL | Status: DC
  Administered 2022-06-24 – 2022-06-27 (×3): 5 mg via ORAL
  Filled 2022-06-24 (×4): qty 1

## 2022-06-24 MED ORDER — LACTATED RINGERS IV SOLN: INTRAVENOUS | Status: DC

## 2022-06-24 MED ORDER — DEXTROSE IN LACTATED RINGERS 5 % IV SOLN: INTRAVENOUS | Status: DC

## 2022-06-24 MED ORDER — ACETAMINOPHEN 650 MG RE SUPP: 650.0000 mg | Freq: Four times a day (QID) | RECTAL | Status: DC | PRN

## 2022-06-24 MED ORDER — ACETAMINOPHEN 325 MG PO TABS
650.0000 mg | ORAL_TABLET | Freq: Four times a day (QID) | ORAL | Status: DC | PRN
  Administered 2022-06-25 – 2022-06-27 (×4): 650 mg via ORAL
  Filled 2022-06-24 (×4): qty 2

## 2022-06-24 MED ORDER — DEXTROSE 50 % IV SOLN
12.5000 g | INTRAVENOUS | Status: AC
  Administered 2022-06-24: 12.5 g via INTRAVENOUS
  Filled 2022-06-24: qty 50

## 2022-06-24 MED ORDER — LETROZOLE 2.5 MG PO TABS
2.5000 mg | ORAL_TABLET | Freq: Every day | ORAL | Status: DC
  Administered 2022-06-24 – 2022-06-27 (×3): 2.5 mg via ORAL
  Filled 2022-06-24 (×4): qty 1

## 2022-06-24 MED ORDER — INSULIN ASPART 100 UNIT/ML IJ SOLN
0.0000 [IU] | INTRAMUSCULAR | Status: DC
  Administered 2022-06-24: 2 [IU] via SUBCUTANEOUS
  Administered 2022-06-24 – 2022-06-25 (×2): 3 [IU] via SUBCUTANEOUS
  Administered 2022-06-25: 2 [IU] via SUBCUTANEOUS
  Administered 2022-06-25: 5 [IU] via SUBCUTANEOUS
  Administered 2022-06-25: 7 [IU] via SUBCUTANEOUS
  Administered 2022-06-26: 3 [IU] via SUBCUTANEOUS
  Administered 2022-06-26 (×2): 5 [IU] via SUBCUTANEOUS
  Administered 2022-06-26: 7 [IU] via SUBCUTANEOUS
  Administered 2022-06-26: 2 [IU] via SUBCUTANEOUS
  Administered 2022-06-26: 7 [IU] via SUBCUTANEOUS
  Administered 2022-06-27: 2 [IU] via SUBCUTANEOUS
  Administered 2022-06-27: 3 [IU] via SUBCUTANEOUS
  Administered 2022-06-27: 2 [IU] via SUBCUTANEOUS
  Filled 2022-06-24 (×16): qty 1

## 2022-06-24 MED ORDER — DONEPEZIL HCL 5 MG PO TABS
10.0000 mg | ORAL_TABLET | Freq: Every day | ORAL | Status: DC
  Administered 2022-06-24 – 2022-06-27 (×4): 10 mg via ORAL
  Filled 2022-06-24 (×4): qty 2

## 2022-06-24 MED ORDER — ONDANSETRON HCL 4 MG/2ML IJ SOLN
4.0000 mg | Freq: Four times a day (QID) | INTRAMUSCULAR | Status: DC | PRN
  Administered 2022-06-25: 4 mg via INTRAVENOUS

## 2022-06-24 MED ORDER — MORPHINE SULFATE (PF) 2 MG/ML IV SOLN: 2.0000 mg | INTRAVENOUS | Status: DC | PRN

## 2022-06-24 NOTE — Assessment & Plan Note (Signed)
Continue Femara 

## 2022-06-24 NOTE — TOC Initial Note (Signed)
Transition of Care [facility]) - Initial/Assessment Note    Patient Details  Name: [name] MRN: [id number] Date of Birth: 07-25-1940  Transition of Care [facility]) CM/SW Contact:    Allena Katz, LCSW Phone Number: 06/24/2022, 2:53 PM  Clinical Narrative:       CSW spoke with patients daughter who reports she is staying with patient and her residence. Daughter states pt is currently active with [facility] and would like to resume those services at discharge. Daughter reports pt has a rollator but she would be interested in having a wheelchair if insurance would pay for it. Daughter is unsure if the rollator was processed through her insurance. Daughter was interested in getting additional assistance. CSW spoke with patient about free aide services that are offered to some [employer] recipients that always best care assists to coordinate. Daughter says that she is agreeable to this referral being made to see if patient qualifies. Daughter says patient does not qualify for medicaid. Daughter did have aide services through [employer] for 4 hours 4 times a week but feels she may need additional help. She states that she would also like to have a 3in1 for patient at home.               Patient Goals and CMS Choice            Expected Discharge Plan and Services                                              Prior Living Arrangements/Services                       Activities of Daily Living Home Assistive Devices/Equipment: Environmental consultant (specify type), Eyeglasses ADL Screening (condition at time of admission) Patient's cognitive ability adequate to safely complete daily activities?: Yes Is the patient deaf or have difficulty hearing?: No Does the patient have difficulty seeing, even when wearing glasses/contacts?: No Does the patient have difficulty concentrating, remembering, or making decisions?: No Patient able to express need for assistance with ADLs?: Yes Does the  patient have difficulty dressing or bathing?: No Independently performs ADLs?: Yes (appropriate for developmental age) Does the patient have difficulty walking or climbing stairs?: Yes Weakness of Legs: Both Weakness of Arms/Hands: None  Permission Sought/Granted                  Emotional Assessment              Admission diagnosis:  Biliary colic [K80.50] Nausea and vomiting, unspecified vomiting type [R11.2] Patient Active Problem List   Diagnosis Date Noted   Dementia 06/24/2022   Biliary colic 06/24/2022   Abnormal LFTs 06/24/2022   Breast cancer s/p  unilateral modified radical mastectomy, left 05/2022 06/24/2022   Breast cancer 05/25/2022   Genetic testing 05/01/2022   Malignant neoplasm of areola of left breast in female, estrogen receptor positive 04/14/2022   Mild protein-calorie malnutrition 07/29/2021   Stage 3a chronic kidney disease 07/29/2021   Gait instability 07/29/2021   Type 1 diabetes mellitus with hyperglycemia, with long-term current use of insulin 04/28/2018   Atherosclerosis of aorta 04/01/2016   Aortic sclerosis 09/19/2014   Hypertension associated with diabetes (HCC) 09/19/2014   Cholelithiasis 09/19/2014   Carpal tunnel syndrome 09/19/2014   Detached retina 09/19/2014   Type 1 diabetes mellitus with microalbuminuria (HCC) 09/19/2014  Dyslipidemia 09/19/2014   Hypophosphatemia 09/19/2014   Mild cognitive impairment with memory loss 09/19/2014   Microalbuminuria 09/19/2014   OP (osteoporosis) 09/19/2014   Menopause 09/19/2014   Ptosis of eyelid 09/19/2014   Hypoglycemia associated with diabetes 10/24/2013   Urge incontinence 08/01/2013   Hyperlipidemia due to type 1 diabetes mellitus 08/01/2013   Vitamin D deficiency 02/24/2007   PCP:  Alba Cory, MD Pharmacy:   [phone] [place], [place] - [address] [place] [address] Phone: [phone] Fax: [phone]  [id number] [place]  [facility] - [address] [place] [address] Phone: [phone] Fax: [phone]     Social Determinants of Health (SDOH) Social History: SDOH Screenings   Food Insecurity: No Food Insecurity (06/24/2022)  Housing: Low Risk  (06/24/2022)  Transportation Needs: No Transportation Needs (06/24/2022)  Utilities: Not At Risk (06/24/2022)  Alcohol Screen: Low Risk  (09/08/2019)  Depression (PHQ2-9): Low Risk  (04/14/2022)  Financial Resource Strain: Low Risk  (05/14/2022)  Physical Activity: Inactive (05/14/2022)  Social Connections: Socially Integrated (05/14/2022)  Stress: No Stress Concern Present (05/14/2022)  Tobacco Use: Low Risk  (06/24/2022)   SDOH Interventions:     Readmission Risk Interventions     No data to display

## 2022-06-24 NOTE — Progress Notes (Signed)
Spoke with daughter Alvis Lemmings, her mom has been admitted to the hospital and may need cholecystectomy.   She was scheduled for simulation on Monday, we have moved this out a week to 4/29.

## 2022-06-24 NOTE — Progress Notes (Signed)
Inpatient Diabetes Program Recommendations  AACE/ADA: New Consensus Statement on Inpatient Glycemic Control (2015)  Target Ranges:  Prepandial:   less than 140 mg/dL      Peak postprandial:   less than 180 mg/dL (1-2 hours)      Critically ill patients:  140 - 180 mg/dL   Lab Results  Component Value Date   GLUCAP 63 (L) 06/24/2022   HGBA1C 6.5 (H) 06/23/2022    Review of Glycemic Control  Latest Reference Range & Units 06/24/22 02:13 06/24/22 05:11 06/24/22 08:00 06/24/22 11:26  Glucose-Capillary 70 - 99 mg/dL 350 (H) 093 (H) 94 63 (L)   Diabetes history:  DM Outpatient Diabetes medications:  Novolog 6-10 units tid with meals  Tresiba 22 units daily Current orders for Inpatient glycemic control:  Novolog moderate q 4 hours  Inpatient Diabetes Program Recommendations:    Please consider reducing Novolog to sensitive q 4 hours. May need basal insulin added back eventually but will follow.   Thanks,  Beryl Meager, RN, BC-ADM Inpatient Diabetes Coordinator Pager [phone]  (8a-5p)

## 2022-06-24 NOTE — Plan of Care (Signed)
Patient was seen and examined at bedside, patient denies any abdominal pain, no nausea vomiting.  Patient is AAO x 1 due to severe dementia. Patient was admitted with elevated LFTs, presented with pain and nausea vomiting which has been resolved. MRCP is pending.  Patient's daughter was at bedside.  Management plan discussed.  All question and concerns answered.  Will continue current treatment and follow along.

## 2022-06-24 NOTE — Assessment & Plan Note (Addendum)
Cholelithiasis without cholecystitis Elevated LFTs and normal lipase Likely passed stone abnormal LFTs with AST 343, ALT 249, alk phos 151 and total bili 1.8.  Lipase normal at 31. CBD diameter 2 mm Surgical consult to evaluate for cholecystectomy Will keep n.p.o.

## 2022-06-24 NOTE — ED Provider Notes (Signed)
[facility] Provider Note    Event Date/Time   First MD Initiated Contact with Patient 06/23/22 2341     (approximate)  History   Chief Complaint: Emesis  HPI  [name] is a 82 y.o. female with a past medical history of arthritis, CKD, hypertension, dementia, diabetes, presents to the emergency department after an episode of vomiting and had complained of right-sided abdominal pain.  According to the daughter states the patient has a history of dementia and.  They ate lunch today and within several minutes after eating patient was complaining of right-sided abdominal pain and had several episodes of vomiting.  Since then the patient has felt better since coming to the emergency department.  Is no longer complaining of pain.  When asked if the patient hurts she says no but daughter states patient has fairly advanced dementia.  No known fever, afebrile in the emergency department.  Physical Exam   Triage Vital Signs: ED Triage Vitals  Enc Vitals Group     BP 06/23/22 1745 (!) 112/53     Pulse Rate 06/23/22 1745 60     Resp 06/23/22 1745 16     Temp 06/23/22 1745 98.1 F (36.7 C)     Temp Source 06/24/22 0105 Oral     SpO2 06/23/22 1745 93 %     Weight 06/23/22 1745 140 lb (63.5 kg)     Height --      Head Circumference --      Peak Flow --      Pain Score --      Pain Loc --      Pain Edu? --      Excl. in GC? --     Most recent vital signs: Vitals:   06/23/22 1745 06/24/22 0105  BP: (!) 112/53 (!) 160/72  Pulse: 60 62  Resp: 16 17  Temp: 98.1 F (36.7 C) 97.6 F (36.4 C)  SpO2: 93% 97%    General: Awake, no distress.  CV:  Good peripheral perfusion.  Regular rate and rhythm  Resp:  Normal effort.  Equal breath sounds bilaterally.  Abd:  No distention.  Soft, nontender.  No rebound or guarding.  Benign abdomen with special attention paid right upper quadrant and epigastrium.   ED Results / Procedures / Treatments    RADIOLOGY  Ultrasound shows cholelithiasis without evidence of acute cholecystitis.   MEDICATIONS ORDERED IN ED: Medications  insulin aspart (novoLOG) injection 6 Units (has no administration in time range)     IMPRESSION / MDM / ASSESSMENT AND PLAN / ED COURSE  I reviewed the triage vital signs and the nursing notes.  Patient's presentation is most consistent with acute presentation with potential threat to life or bodily function.  Patient presents emergency department complaint of right-sided abdominal pain and episode of vomiting.  Since that time patient states she has been feeling better, patient has advanced dementia but is not having any complaints.  Daughter states the patient has not complained or vomited since riving to the emergency department.  Patient's lab work does show moderate LFT elevation as well as a mild lipase elevation.  Reassuringly white blood cell count is normal on the CBC.  Ultrasound shows gallstones but no signs of acute cholecystitis.  Patient has a completely benign abdomen on my exam.  However given the moderate LFT elevation and dementia I did recommend to the daughter admission to the hospital for surgical evaluation and possible cholecystectomy.  Daughter is very  reluctant to any further surgery.  Given the patient's benign abdomen and normal white blood cell count with a overall reassuring ultrasound patient has now been in the emergency department almost 8 hours, we will recheck LFTs and lipase.  If they appear stable or downtrending then the patient could potentially be discharged home as long she can tolerate p.o. trial.  If uptrending patient will require admission for surgical evaluation.  Daughter is agreeable to this plan.  Patient was able to eat with no significant pain or vomiting.  We rechecked the patient's LFTs and unfortunately appear to be uptrending AST went from 230 now elevated to 340 ALT went from 126 now to 249.  Given the patient's  uptrending LFTs I spoke to Dr. Claudine Mouton of general surgery.  He would like the patient admitted for monitoring.  We will discuss with the hospitalist for possible admission with surgery consulting.  Daughter and patient are agreeable to this plan.  FINAL CLINICAL IMPRESSION(S) / ED DIAGNOSES   Biliary colic Nausea vomiting   Note:  This document was prepared using Dragon voice recognition software and may include unintentional dictation errors.   Minna Antis, MD 06/24/22 [phone]

## 2022-06-24 NOTE — Assessment & Plan Note (Signed)
Hydralazine as needed while n.p.o. 

## 2022-06-24 NOTE — Assessment & Plan Note (Signed)
Delirium precautions  continue Aricept

## 2022-06-24 NOTE — H&P (Signed)
History and Physical    Patient: [name] ZOX:[id number] DOB: December 13, 1940 DOA: 06/23/2022 DOS: the patient was seen and examined on 06/24/2022 PCP: Alba Cory, MD  Patient coming from: Home  Chief Complaint:  Chief Complaint  Patient presents with   Emesis    HPI: [name] is a 82 y.o. female with medical history significant for Diabetes, hypertension, cholelithiasis, dementia, left breast cancer s/p modified radical mastectomy on 05/15/2022, who presents to the ED with right-sided abdominal pain and vomiting.  The symptoms started after having lunch however since arriving to the ED the pain has resolved and with no further vomiting ED course and data review: BP 112/53 with otherwise normal vitals.  Labs significant for abnormal LFTs with AST 343, ALT 249, alk phos 151 and total bili 1.8.  Lipase normal at 31.  WBC normal.  Glucose elevated at 249 Right upper quadrant ultrasound showed cholelithiasis without sonographic evidence of acute cholecystitis with CBD diameter of 2 mm. Patient was given IV insulin in the ED her blood sugar. Hospitalist consulted for admission.    Past Medical History:  Diagnosis Date   Arthritis    Atherosclerosis of aorta    CKD (chronic kidney disease) stage 3, GFR 30-59 ml/min    Dementia    Diabetes mellitus without complication    Dysrhythmia    Heart murmur    Hypertension    Type 1 diabetes    Past Surgical History:  Procedure Laterality Date   AXILLARY LYMPH NODE BIOPSY Left 04/07/2022   bx done, hydromarker placed, path pending   BREAST BIOPSY Left 04/07/2022   [place] bx mass, ribbon marker, path pending   BREAST BIOPSY Left 04/07/2022   [place] LT BREAST BX W LOC DEV 1ST LESION IMG BX SPEC US GUIDE 04/07/2022 [facility]   BREAST EXCISIONAL BIOPSY Left    neg   BREAST SURGERY  1970   EYE SURGERY     detached retina   MASTECTOMY MODIFIED RADICAL Left 05/25/2022   Procedure: MASTECTOMY MODIFIED RADICAL;  Surgeon: Carolan Shiver, MD;  Location: [facility] ORS;  Service: General;  Laterality: Left;   Social History:  reports that she has never smoked. She has never used smokeless tobacco. She reports that she does not drink alcohol and does not use drugs.  Allergies  Allergen Reactions   Colesevelam Hcl     scotomas   Daucus Carota    Penicillins Itching    Family History  Problem Relation Age of Onset   Alzheimer's disease Mother    Diabetes Mother    Arthritis Mother    Diabetes Father    Hypertension Father    Hyperlipidemia Father    Stroke Brother    Multiple sclerosis Daughter     Prior to Admission medications   Medication Sig Start Date End Date Taking? Authorizing Provider  Accu-Chek Softclix Lancets lancets  08/26/20   [provider]  alendronate (FOSAMAX) 70 MG tablet Take with a full glass of water on an empty stomach. Patient taking differently: 70 mg once a week. Take with a full glass of water on an empty stomach. 01/27/22   Alba Cory, MD  amLODipine (NORVASC) 5 MG tablet Take 1 tablet (5 mg total) by mouth daily. 01/27/22   Alba Cory, MD  atorvastatin (LIPITOR) 80 MG tablet Take 1 tablet (80 mg total) by mouth daily. 01/27/22   Alba Cory, MD  blood glucose meter kit and supplies  10/02/13   [provider]  Calcium Carb-Cholecalciferol 600-10  MG-MCG TABS Take 2 tablets by mouth daily.    [provider]  [facility] ALCOHOL SWABS PADS USE 4 TIMES DAILY TO WIPE SKIN BEFORE USING INSULIN 04/08/15   Carlynn Purl, Danna Hefty, MD  donepezil (ARICEPT ODT) 10 MG disintegrating tablet TAKE 1 TABLET BY MOUTH EVERYDAY AT BEDTIME 01/27/22   Sowles, Danna Hefty, MD  Glucagon (BAQSIMI ONE PACK) 3 MG/DOSE POWD  09/13/20   [provider]  hydrALAZINE (APRESOLINE) 10 MG tablet TAKE 1 TABLET (10 MG TOTAL) BY MOUTH 3 (THREE) TIMES DAILY AS NEEDED. BP ABOVE 150/90 04/28/22   Alba Cory, MD  insulin aspart (NOVOLOG) 100 UNIT/ML FlexPen 8-12 Units 3 (three) times daily with  meals. 12/05/20   [provider]  insulin degludec (TRESIBA FLEXTOUCH) 100 UNIT/ML FlexTouch Pen Inject 22 Units into the skin daily. qam 12/05/20   Sherlon Handing, MD  letrozole [facility]) 2.5 MG tablet Take 1 tablet (2.5 mg total) by mouth daily. 04/14/22   Creig Hines, MD  losartan-hydrochlorothiazide (HYZAAR) 100-12.5 MG tablet Take 1 tablet by mouth daily. 01/27/22   Alba Cory, MD  traMADol (ULTRAM) 50 MG tablet Take 1 tablet (50 mg total) by mouth every 6 (six) hours as needed. 05/26/22 05/26/23  Carolan Shiver, MD    Physical Exam: Vitals:   06/23/22 1745 06/24/22 0105  BP: (!) 112/53 (!) 160/72  Pulse: 60 62  Resp: 16 17  Temp: 98.1 F (36.7 C) 97.6 F (36.4 C)  TempSrc:  Oral  SpO2: 93% 97%  Weight: 63.5 kg    Physical Exam Vitals and nursing note reviewed.  Constitutional:      General: She is not in acute distress. HENT:     Head: Normocephalic and atraumatic.  Cardiovascular:     Rate and Rhythm: Normal rate and regular rhythm.     Heart sounds: Normal heart sounds.  Pulmonary:     Effort: Pulmonary effort is normal.     Breath sounds: Normal breath sounds.  Abdominal:     Palpations: Abdomen is soft.     Tenderness: There is no abdominal tenderness.  Neurological:     Mental Status: Mental status is at baseline.     Labs on Admission: I have personally reviewed following labs and imaging studies  CBC: Recent Labs  Lab 06/23/22 1748  WBC 9.6  HGB 12.1  HCT 36.5  MCV 89.2  PLT 243   Basic Metabolic Panel: Recent Labs  Lab 06/23/22 1748  NA 136  K 3.7  CL 100  CO2 25  GLUCOSE 249*  BUN 20  CREATININE 0.72  CALCIUM 9.4   GFR: Estimated Creatinine Clearance: 50 mL/min (by C-G formula based on SCr of 0.72 mg/dL). Liver Function Tests: Recent Labs  Lab 06/23/22 1748 06/24/22 0133  AST 238*  232* 343*  ALT 126*  123* 249*  ALKPHOS 120  122 151*  BILITOT 1.4*  1.5* 1.8*  PROT 7.2  7.1 7.0  ALBUMIN 3.6  3.5  3.6   Recent Labs  Lab 06/23/22 1748 06/24/22 0133  LIPASE 56* 31   No results for input(s): "AMMONIA" in the last 168 hours. Coagulation Profile: No results for input(s): "INR", "PROTIME" in the last 168 hours. Cardiac Enzymes: No results for input(s): "CKTOTAL", "CKMB", "CKMBINDEX", "TROPONINI" in the last 168 hours. BNP (last 3 results) No results for input(s): "PROBNP" in the last 8760 hours. HbA1C: No results for input(s): "HGBA1C" in the last 72 hours. CBG: Recent Labs  Lab 06/24/22 0213  GLUCAP 282*  Lipid Profile: No results for input(s): "CHOL", "HDL", "LDLCALC", "TRIG", "CHOLHDL", "LDLDIRECT" in the last 72 hours. Thyroid Function Tests: No results for input(s): "TSH", "T4TOTAL", "FREET4", "T3FREE", "THYROIDAB" in the last 72 hours. Anemia Panel: No results for input(s): "VITAMINB12", "FOLATE", "FERRITIN", "TIBC", "IRON", "RETICCTPCT" in the last 72 hours. Urine analysis:    Component Value Date/Time   COLORURINE AMBER (A) 06/24/2022 0133   APPEARANCEUR CLOUDY (A) 06/24/2022 0133   APPEARANCEUR Clear 02/25/2013 1537   LABSPEC 1.020 06/24/2022 0133   LABSPEC 1.024 02/25/2013 1537   PHURINE 5.0 06/24/2022 0133   GLUCOSEU 50 (A) 06/24/2022 0133   GLUCOSEU >=500 02/25/2013 1537   HGBUR NEGATIVE 06/24/2022 0133   BILIRUBINUR NEGATIVE 06/24/2022 0133   BILIRUBINUR neg 03/14/2018 1655   BILIRUBINUR Negative 02/25/2013 1537   KETONESUR 5 (A) 06/24/2022 0133   PROTEINUR 30 (A) 06/24/2022 0133   UROBILINOGEN 0.2 03/14/2018 1655   NITRITE NEGATIVE 06/24/2022 0133   LEUKOCYTESUR NEGATIVE 06/24/2022 0133   LEUKOCYTESUR Negative 02/25/2013 1537    Radiological Exams on Admission: US Abdomen Limited RUQ (LIVER/GB)  Result Date: 06/23/2022 CLINICAL DATA:  Right upper quadrant abdominal pain EXAM: ULTRASOUND ABDOMEN LIMITED RIGHT UPPER QUADRANT COMPARISON:  PET CT 04/28/2022 FINDINGS: Gallbladder: The gallbladder is stone filled resulting in a "wall echo shadow"  complex. No superimposed gallbladder wall thickening or pericholecystic fluid is identified. The sonographic Eulah Pont sign is reportedly negative. Common bile duct: Diameter: 2 mm in proximal diameter Liver: No focal lesion identified. Within normal limits in parenchymal echogenicity. Portal vein is patent on color Doppler imaging with normal direction of blood flow towards the liver. Other: None. IMPRESSION: 1. Cholelithiasis without sonographic evidence of acute cholecystitis. Electronically Signed   By: Helyn Numbers M.D.   On: 06/23/2022 21:59     Data Reviewed: Relevant notes from primary care and specialist visits, past discharge summaries as available in EHR, including Care Everywhere. Prior diagnostic testing as pertinent to current admission diagnoses Updated medications and problem lists for reconciliation ED course, including vitals, labs, imaging, treatment and response to treatment Triage notes, nursing and pharmacy notes and ED provider's notes Notable results as noted in HPI   Assessment and Plan: * Biliary colic Cholelithiasis without cholecystitis Elevated LFTs and normal lipase Likely passed stone abnormal LFTs with AST 343, ALT 249, alk phos 151 and total bili 1.8.  Lipase normal at 31. CBD diameter 2 mm Surgical consult to evaluate for cholecystectomy Will keep n.p.o.  Type 1 diabetes mellitus with hyperglycemia, with long-term current use of insulin Sliding scale insulin coverage  Breast cancer s/p  unilateral modified radical mastectomy, left 05/2022 Continue Femara  Dementia Delirium precautions  continue Aricept  Hypertension associated with diabetes (HCC) Hydralazine as needed while n.p.o.     DVT prophylaxis: SCD  Consults: Surgery, Dr. Dolores Frame  Advance Care Planning:   Code Status: Prior   Family Communication: none  Disposition Plan: Back to previous home environment  Severity of Illness: The appropriate patient status for this patient is  INPATIENT. Inpatient status is judged to be reasonable and necessary in order to provide the required intensity of service to ensure the patient's safety. The patient's presenting symptoms, physical exam findings, and initial radiographic and laboratory data in the context of their chronic comorbidities is felt to place them at high risk for further clinical deterioration. Furthermore, it is not anticipated that the patient will be medically stable for discharge from the hospital within 2 midnights of admission.   * I certify that at  the point of admission it is my clinical judgment that the patient will require inpatient hospital care spanning beyond 2 midnights from the point of admission due to high intensity of service, high risk for further deterioration and high frequency of surveillance required.*  Author: Andris Baumann, MD 06/24/2022 4:07 AM  For on call review [email or web address].

## 2022-06-24 NOTE — Assessment & Plan Note (Signed)
Sliding scale insulin coverage 

## 2022-06-24 NOTE — Consult Note (Signed)
SURGICAL CONSULTATION NOTE   HISTORY OF PRESENT ILLNESS (HPI):  82 y.o. female presented to [facility] ED for evaluation of nausea and vomiting. Patient has advanced dementia but daughter at bedside.  As per daughter the patient has been having nausea and vomiting for the last couple of days.  There has been no complaint of abdominal pain.  This has been retractable and she has not been able to keep anything down.  At the ED she was found with persistent nausea.  There was no significant abdominal pain.  Again patient not reliable due to severe dementia.  Lab workup shows elevated bilirubin.  This was repeated and was after and that the bilirubin was increasing.  Ultrasound shows cholelithiasis.  No sign of cholecystitis.  I personally evaluated the images of the abdominal ultrasound.  Surgery is consulted by Dr. Para March in this context for evaluation and management of cholelithiasis with elevated bilirubin.  PAST MEDICAL HISTORY (PMH):  Past Medical History:  Diagnosis Date   Arthritis    Atherosclerosis of aorta    CKD (chronic kidney disease) stage 3, GFR 30-59 ml/min    Dementia    Diabetes mellitus without complication    Dysrhythmia    Heart murmur    Hypertension    Type 1 diabetes      PAST SURGICAL HISTORY (PSH):  Past Surgical History:  Procedure Laterality Date   AXILLARY LYMPH NODE BIOPSY Left 04/07/2022   bx done, hydromarker placed, path pending   BREAST BIOPSY Left 04/07/2022   [place] bx mass, ribbon marker, path pending   BREAST BIOPSY Left 04/07/2022   [place] LT BREAST BX W LOC DEV 1ST LESION IMG BX SPEC US GUIDE 04/07/2022 [facility]   BREAST EXCISIONAL BIOPSY Left    neg   BREAST SURGERY  1970   EYE SURGERY     detached retina   MASTECTOMY MODIFIED RADICAL Left 05/25/2022   Procedure: MASTECTOMY MODIFIED RADICAL;  Surgeon: Carolan Shiver, MD;  Location: [facility] ORS;  Service: General;  Laterality: Left;     MEDICATIONS:  Prior to Admission medications    Medication Sig Start Date End Date Taking? Authorizing Provider  amLODipine (NORVASC) 5 MG tablet Take 1 tablet (5 mg total) by mouth daily. 01/27/22  Yes Sowles, Danna Hefty, MD  atorvastatin (LIPITOR) 80 MG tablet Take 1 tablet (80 mg total) by mouth daily. 01/27/22  Yes Sowles, Danna Hefty, MD  Calcium Carb-Cholecalciferol 600-10 MG-MCG TABS Take 1 tablet by mouth 2 (two) times daily.   Yes [provider]  hydrALAZINE (APRESOLINE) 10 MG tablet TAKE 1 TABLET (10 MG TOTAL) BY MOUTH 3 (THREE) TIMES DAILY AS NEEDED. BP ABOVE 150/90 Patient taking differently: Take 10 mg by mouth 3 (three) times daily as needed (BP above 150/90). 04/28/22  Yes Sowles, Danna Hefty, MD  insulin aspart (NOVOLOG) 100 UNIT/ML FlexPen Inject 6-10 Units into the skin 3 (three) times daily with meals. 12/05/20  Yes [provider]  insulin degludec (TRESIBA FLEXTOUCH) 100 UNIT/ML FlexTouch Pen Inject 22 Units into the skin daily. qam 12/05/20  Yes Sherlon Handing, MD  letrozole [facility]) 2.5 MG tablet Take 1 tablet (2.5 mg total) by mouth daily. 04/14/22  Yes Creig Hines, MD  losartan-hydrochlorothiazide (HYZAAR) 100-12.5 MG tablet Take 1 tablet by mouth daily. 01/27/22  Yes Sowles, Danna Hefty, MD  traMADol (ULTRAM) 50 MG tablet Take 1 tablet (50 mg total) by mouth every 6 (six) hours as needed. 05/26/22 05/26/23 Yes Carolan Shiver, MD  Accu-Chek Softclix Lancets lancets  08/26/20  [provider]  alendronate (FOSAMAX) 70 MG tablet Take with a full glass of water on an empty stomach. Patient taking differently: 70 mg once a week. Take with a full glass of water on an empty stomach. 01/27/22   Alba Cory, MD  blood glucose meter kit and supplies  10/02/13   [provider]  [employer] ALCOHOL SWABS PADS USE 4 TIMES DAILY TO WIPE SKIN BEFORE USING INSULIN 04/08/15   Carlynn Purl, Danna Hefty, MD  donepezil (ARICEPT ODT) 10 MG disintegrating tablet TAKE 1 TABLET BY MOUTH EVERYDAY AT BEDTIME Patient taking  differently: Take 10 mg by mouth at bedtime. TAKE 1 TABLET BY MOUTH EVERYDAY AT BEDTIME 01/27/22   Alba Cory, MD  Glucagon (BAQSIMI ONE PACK) 3 MG/DOSE POWD  09/13/20   [provider]     ALLERGIES:  Allergies  Allergen Reactions   Colesevelam Hcl     scotomas   Daucus Carota    Penicillins Itching     SOCIAL HISTORY:  Social History   Socioeconomic History   Marital status: Married    Spouse name: Sherilyn Cooter   Number of children: 3   Years of education: Not on file   Highest education level: 12th grade  Occupational History   Occupation: Retired  Tobacco Use   Smoking status: Never   Smokeless tobacco: Never   Tobacco comments:    smoking cessation materials not required  Vaping Use   Vaping Use: Never used  Substance and Sexual Activity   Alcohol use: No    Alcohol/week: 0.0 standard drinks of alcohol   Drug use: No   Sexual activity: Not Currently  Other Topics Concern   Not on file  Social History Narrative   Not on file   Social Determinants of Health   Financial Resource Strain: Low Risk  (05/14/2022)   Overall Financial Resource Strain (CARDIA)    Difficulty of Paying Living Expenses: Not very hard  Food Insecurity: No Food Insecurity (06/24/2022)   Hunger Vital Sign    Worried About Running Out of Food in the Last Year: Never true    Ran Out of Food in the Last Year: Never true  Transportation Needs: No Transportation Needs (06/24/2022)   PRAPARE - Administrator, Civil Service (Medical): No    Lack of Transportation (Non-Medical): No  Physical Activity: Inactive (05/14/2022)   Exercise Vital Sign    Days of Exercise per Week: 0 days    Minutes of Exercise per Session: 0 min  Stress: No Stress Concern Present (05/14/2022)   [employer] of Occupational Health - Occupational Stress Questionnaire    Feeling of Stress : Only a little  Social Connections: Socially Integrated (05/14/2022)   Social Connection and Isolation Panel [NHANES]     Frequency of Communication with Friends and Family: Never    Frequency of Social Gatherings with Friends and Family: More than three times a week    Attends Religious Services: 1 to 4 times per year    Active Member of [employer] or Organizations: Yes    Attends Banker Meetings: Never    Marital Status: Married  Catering manager Violence: Not At Risk (06/24/2022)   Humiliation, Afraid, Rape, and Kick questionnaire    Fear of Current or Ex-Partner: No    Emotionally Abused: No    Physically Abused: No    Sexually Abused: No      FAMILY HISTORY:  Family History  Problem Relation Age of Onset   Alzheimer's disease  Mother    Diabetes Mother    Arthritis Mother    Diabetes Father    Hypertension Father    Hyperlipidemia Father    Stroke Brother    Multiple sclerosis Daughter      REVIEW OF SYSTEMS:  Constitutional: denies weight loss, fever, chills, or sweats  Eyes: denies any other vision changes, history of eye injury  ENT: denies sore throat, hearing problems  Respiratory: denies shortness of breath, wheezing  Cardiovascular: denies chest pain, palpitations  Gastrointestinal: Positive nausea and vomiting Genitourinary: denies burning with urination or urinary frequency Musculoskeletal: denies any other joint pains or cramps  Skin: denies any other rashes or skin discolorations  Neurological: denies any other headache, dizziness, weakness  Psychiatric: denies any other depression, anxiety   All other review of systems were negative   VITAL SIGNS:  Temp:  [97.6 F (36.4 C)-98.1 F (36.7 C)] 97.7 F (36.5 C) (04/17 0754) Pulse Rate:  [57-66] 59 (04/17 0754) Resp:  [14-17] 14 (04/17 0754) BP: (112-166)/(53-76) 166/60 (04/17 0754) SpO2:  [93 %-100 %] 100 % (04/17 0754) Weight:  [63.5 kg] 63.5 kg (04/16 1745)       Weight: 63.5 kg     INTAKE/OUTPUT:  This shift: No intake/output data recorded.  Last 2 shifts: @IOLAST2SHIFTS @   PHYSICAL EXAM:   Constitutional:  -- Normal body habitus  -- Awake, alert, and in no distress Eyes:  -- Pupils equally round and reactive to light  -- No scleral icterus  Ear, nose, and throat:  -- No jugular venous distension  Pulmonary:  -- No crackles  -- Equal breath sounds bilaterally -- Breathing non-labored at rest Cardiovascular:  -- S1, S2 present  -- No pericardial rubs Gastrointestinal:  -- Abdomen soft, nontender, non-distended, no guarding or rebound tenderness -- No abdominal masses appreciated, pulsatile or otherwise  Musculoskeletal and Integumentary:  -- Wounds: None appreciated -- Extremities: B/L UE and LE FROM, hands and feet warm, no edema  Neurologic:  -- Motor function: intact and symmetric -- Sensation: intact and symmetric   Labs:     Latest Ref Rng & Units 06/23/2022    5:48 PM 05/26/2022    6:53 AM 01/27/2022    1:52 PM  CBC  WBC 4.0 - 10.5 K/uL 9.6  8.9  6.7   Hemoglobin 12.0 - 15.0 g/dL 95.1  88.4  16.6   Hematocrit 36.0 - 46.0 % 36.5  30.3  38.6   Platelets 150 - 400 K/uL 243  172  253       Latest Ref Rng & Units 06/24/2022    1:33 AM 06/23/2022    5:48 PM 05/26/2022    6:53 AM  CMP  Glucose 70 - 99 mg/dL  063  016   BUN 8 - 23 mg/dL  20  12   Creatinine 0.10 - 1.00 mg/dL  9.32  3.55   Sodium 732 - 145 mmol/L  136  139   Potassium 3.5 - 5.1 mmol/L  3.7  3.2   Chloride 98 - 111 mmol/L  100  112   CO2 22 - 32 mmol/L  25  21   Calcium 8.9 - 10.3 mg/dL  9.4  7.4   Total Protein 6.5 - 8.1 g/dL 7.0  7.1    7.2    Total Bilirubin 0.3 - 1.2 mg/dL 1.8  1.5    1.4    Alkaline Phos 38 - 126 U/L 151  122    120    AST 15 -  41 U/L 343  232    238    ALT 0 - 44 U/L 249  123    126      Imaging studies:  EXAM: ULTRASOUND ABDOMEN LIMITED RIGHT UPPER QUADRANT   COMPARISON:  PET CT 04/28/2022   FINDINGS: Gallbladder:   The gallbladder is stone filled resulting in a "wall echo shadow" complex. No superimposed gallbladder wall thickening  or pericholecystic fluid is identified. The sonographic Eulah Pont sign is reportedly negative.   Common bile duct:   Diameter: 2 mm in proximal diameter   Liver:   No focal lesion identified. Within normal limits in parenchymal echogenicity. Portal vein is patent on color Doppler imaging with normal direction of blood flow towards the liver.   Other: None.   IMPRESSION: 1. Cholelithiasis without sonographic evidence of acute cholecystitis.     Electronically Signed   By: Helyn Numbers M.D.   On: 06/23/2022 21:59  Assessment/Plan:  82 y.o. female with nausea and vomiting with cholelithiasis and elevated liver enzymes, complicated by pertinent comorbidities including Alzheimer's dementia, breast cancer.  Cholelithiasis with jaundice  -With persistent increasing trend of the bilirubin, suspected choledocholithiasis.  I order MRCP -If MRCP positive will recommend ERCP evaluation -If MRCP negative will discuss with patient's daughter if she would like to proceed with cholecystectomy to prevent recurrent episode of choledocholithiasis versus continues with patient. -Continue nausea and pain management    Gae Gallop, MD

## 2022-06-25 ENCOUNTER — Inpatient Hospital Stay: Payer: Medicare HMO | Admitting: Anesthesiology

## 2022-06-25 ENCOUNTER — Encounter
Admission: EM | Disposition: A | Discharge status: Other Acute Inpatient Hospital | Payer: Self-pay | Source: Home / Self Care | Attending: Student

## 2022-06-25 ENCOUNTER — Other Ambulatory Visit: Payer: Self-pay

## 2022-06-25 DIAGNOSIS — K8012 Calculus of gallbladder with acute and chronic cholecystitis without obstruction: Secondary | ICD-10-CM | POA: Diagnosis not present

## 2022-06-25 DIAGNOSIS — K8050 Calculus of bile duct without cholangitis or cholecystitis without obstruction: Secondary | ICD-10-CM | POA: Diagnosis not present

## 2022-06-25 LAB — CBC
HCT: 35 % — ABNORMAL LOW (ref 36.0–46.0)
Hemoglobin: 11.9 g/dL — ABNORMAL LOW (ref 12.0–15.0)
MCH: 29.7 pg (ref 26.0–34.0)
MCHC: 34 g/dL (ref 30.0–36.0)
MCV: 87.3 fL (ref 80.0–100.0)
Platelets: 201 10*3/uL (ref 150–400)
RBC: 4.01 MIL/uL (ref 3.87–5.11)
RDW: 13.7 % (ref 11.5–15.5)
WBC: 5.4 10*3/uL (ref 4.0–10.5)
nRBC: 0 % (ref 0.0–0.2)

## 2022-06-25 LAB — GLUCOSE, CAPILLARY
Glucose-Capillary: 123 mg/dL — ABNORMAL HIGH (ref 70–99)
Glucose-Capillary: 196 mg/dL — ABNORMAL HIGH (ref 70–99)
Glucose-Capillary: 218 mg/dL — ABNORMAL HIGH (ref 70–99)
Glucose-Capillary: 227 mg/dL — ABNORMAL HIGH (ref 70–99)
Glucose-Capillary: 257 mg/dL — ABNORMAL HIGH (ref 70–99)
Glucose-Capillary: 324 mg/dL — ABNORMAL HIGH (ref 70–99)
Glucose-Capillary: 325 mg/dL — ABNORMAL HIGH (ref 70–99)

## 2022-06-25 LAB — HEPATIC FUNCTION PANEL
ALT: 319 U/L — ABNORMAL HIGH (ref 0–44)
AST: 272 U/L — ABNORMAL HIGH (ref 15–41)
Albumin: 3.1 g/dL — ABNORMAL LOW (ref 3.5–5.0)
Alkaline Phosphatase: 188 U/L — ABNORMAL HIGH (ref 38–126)
Bilirubin, Direct: 0.3 mg/dL — ABNORMAL HIGH (ref 0.0–0.2)
Indirect Bilirubin: 0.7 mg/dL (ref 0.3–0.9)
Total Bilirubin: 1 mg/dL (ref 0.3–1.2)
Total Protein: 6.7 g/dL (ref 6.5–8.1)

## 2022-06-25 LAB — BASIC METABOLIC PANEL
Anion gap: 8 (ref 5–15)
BUN: 8 mg/dL (ref 8–23)
CO2: 27 mmol/L (ref 22–32)
Calcium: 8.9 mg/dL (ref 8.9–10.3)
Chloride: 104 mmol/L (ref 98–111)
Creatinine, Ser: 0.53 mg/dL (ref 0.44–1.00)
GFR, Estimated: >60 mL/min (ref >60)
Glucose, Bld: 234 mg/dL — ABNORMAL HIGH (ref 70–99)
Potassium: 3.3 mmol/L — ABNORMAL LOW (ref 3.5–5.1)
Sodium: 139 mmol/L (ref 135–145)

## 2022-06-25 LAB — PHOSPHORUS: Phosphorus: 3 mg/dL (ref 2.5–4.6)

## 2022-06-25 LAB — LIPASE, BLOOD: Lipase: 25 U/L (ref 11–51)

## 2022-06-25 LAB — MAGNESIUM: Magnesium: 1.7 mg/dL (ref 1.7–2.4)

## 2022-06-25 SURGERY — CHOLECYSTECTOMY, ROBOT-ASSISTED, LAPAROSCOPIC
Anesthesia: General | Site: Abdomen

## 2022-06-25 MED ORDER — BUPIVACAINE-EPINEPHRINE 0.25% -1:200000 IJ SOLN
INTRAMUSCULAR | Status: DC | PRN
  Administered 2022-06-25: 20 mL

## 2022-06-25 MED ORDER — POTASSIUM CHLORIDE CRYS ER 20 MEQ PO TBCR
40.0000 meq | EXTENDED_RELEASE_TABLET | Freq: Once | ORAL | Status: DC
  Filled 2022-06-25: qty 2

## 2022-06-25 MED ORDER — INSULIN GLARGINE-YFGN 100 UNIT/ML ~~LOC~~ SOLN
8.0000 [IU] | Freq: Every day | SUBCUTANEOUS | Status: DC
  Administered 2022-06-26: 8 [IU] via SUBCUTANEOUS
  Filled 2022-06-25 (×3): qty 0.08

## 2022-06-25 MED ORDER — DEXAMETHASONE SODIUM PHOSPHATE 10 MG/ML IJ SOLN
INTRAMUSCULAR | Status: DC | PRN
  Administered 2022-06-25: 10 mg via INTRAVENOUS

## 2022-06-25 MED ORDER — ACETAMINOPHEN 10 MG/ML IV SOLN
INTRAVENOUS | Status: DC | PRN
  Administered 2022-06-25: 1000 mg via INTRAVENOUS

## 2022-06-25 MED ORDER — PHENYLEPHRINE HCL (PRESSORS) 10 MG/ML IV SOLN
INTRAVENOUS | Status: DC | PRN
  Administered 2022-06-25: 160 ug via INTRAVENOUS
  Administered 2022-06-25: 80 ug via INTRAVENOUS

## 2022-06-25 MED ORDER — EPINEPHRINE PF 1 MG/ML IJ SOLN
INTRAMUSCULAR | Status: AC
  Filled 2022-06-25: qty 1

## 2022-06-25 MED ORDER — SUCCINYLCHOLINE CHLORIDE 200 MG/10ML IV SOSY
PREFILLED_SYRINGE | INTRAVENOUS | Status: DC | PRN
  Administered 2022-06-25: 100 mg via INTRAVENOUS

## 2022-06-25 MED ORDER — OXYCODONE HCL 5 MG PO TABS: 5.0000 mg | ORAL_TABLET | Freq: Once | ORAL | Status: DC | PRN

## 2022-06-25 MED ORDER — OXYCODONE HCL 5 MG/5ML PO SOLN: 5.0000 mg | Freq: Once | ORAL | Status: DC | PRN

## 2022-06-25 MED ORDER — SUGAMMADEX SODIUM 200 MG/2ML IV SOLN
INTRAVENOUS | Status: DC | PRN
  Administered 2022-06-25: 200 mg via INTRAVENOUS

## 2022-06-25 MED ORDER — ENOXAPARIN SODIUM 40 MG/0.4ML IJ SOSY
40.0000 mg | PREFILLED_SYRINGE | Freq: Every evening | INTRAMUSCULAR | Status: DC
  Administered 2022-06-26 – 2022-06-27 (×2): 40 mg via SUBCUTANEOUS
  Filled 2022-06-25 (×2): qty 0.4

## 2022-06-25 MED ORDER — LIDOCAINE HCL (CARDIAC) PF 100 MG/5ML IV SOSY
PREFILLED_SYRINGE | INTRAVENOUS | Status: DC | PRN
  Administered 2022-06-25: 30 mg via INTRAVENOUS

## 2022-06-25 MED ORDER — ONDANSETRON HCL 4 MG/2ML IJ SOLN: 4.0000 mg | Freq: Once | INTRAMUSCULAR | Status: DC | PRN

## 2022-06-25 MED ORDER — ROCURONIUM BROMIDE 100 MG/10ML IV SOLN
INTRAVENOUS | Status: DC | PRN
  Administered 2022-06-25 (×2): 20 mg via INTRAVENOUS

## 2022-06-25 MED ORDER — LABETALOL HCL 5 MG/ML IV SOLN
INTRAVENOUS | Status: DC | PRN
  Administered 2022-06-25: 10 mg via INTRAVENOUS

## 2022-06-25 MED ORDER — ACETAMINOPHEN 10 MG/ML IV SOLN: 1000.0000 mg | Freq: Once | INTRAVENOUS | Status: DC | PRN

## 2022-06-25 MED ORDER — CEFAZOLIN SODIUM-DEXTROSE 2-3 GM-%(50ML) IV SOLR
INTRAVENOUS | Status: DC | PRN
  Administered 2022-06-25: 2 g via INTRAVENOUS

## 2022-06-25 MED ORDER — INDOCYANINE GREEN 25 MG IV SOLR
1.2500 mg | Freq: Once | INTRAVENOUS | Status: AC
  Administered 2022-06-25: 2.5 mg via INTRAVENOUS
  Filled 2022-06-25: qty 0.5

## 2022-06-25 MED ORDER — FENTANYL CITRATE (PF) 100 MCG/2ML IJ SOLN
INTRAMUSCULAR | Status: DC | PRN
  Administered 2022-06-25 (×2): 50 ug via INTRAVENOUS

## 2022-06-25 MED ORDER — FENTANYL CITRATE (PF) 100 MCG/2ML IJ SOLN
INTRAMUSCULAR | Status: AC
  Filled 2022-06-25: qty 2

## 2022-06-25 MED ORDER — BUPIVACAINE HCL (PF) 0.25 % IJ SOLN
INTRAMUSCULAR | Status: AC
  Filled 2022-06-25: qty 30

## 2022-06-25 MED ORDER — PROPOFOL 10 MG/ML IV BOLUS
INTRAVENOUS | Status: DC | PRN
  Administered 2022-06-25: 80 mg via INTRAVENOUS

## 2022-06-25 MED ORDER — CEFAZOLIN SODIUM-DEXTROSE 2-4 GM/100ML-% IV SOLN
INTRAVENOUS | Status: AC
  Filled 2022-06-25: qty 100

## 2022-06-25 MED ORDER — ACETAMINOPHEN 10 MG/ML IV SOLN
INTRAVENOUS | Status: AC
  Filled 2022-06-25: qty 100

## 2022-06-25 MED ORDER — LACTATED RINGERS IV SOLN: INTRAVENOUS | Status: DC

## 2022-06-25 MED ORDER — GLYCOPYRROLATE 0.2 MG/ML IJ SOLN
INTRAMUSCULAR | Status: DC | PRN
  Administered 2022-06-25: .2 mg via INTRAVENOUS

## 2022-06-25 MED ORDER — FENTANYL CITRATE (PF) 100 MCG/2ML IJ SOLN
25.0000 ug | INTRAMUSCULAR | Status: DC | PRN
  Administered 2022-06-25: 25 ug via INTRAVENOUS

## 2022-06-25 MED ORDER — LACTATED RINGERS IV SOLN: INTRAVENOUS | Status: DC | PRN

## 2022-06-25 MED ORDER — 0.9 % SODIUM CHLORIDE (POUR BTL) OPTIME
TOPICAL | Status: DC | PRN
  Administered 2022-06-25: 500 mL

## 2022-06-25 SURGICAL SUPPLY — 52 items
ADH SKN CLS APL DERMABOND .7 (GAUZE/BANDAGES/DRESSINGS) ×2
BAG PRESSURE INF REUSE 1000 (BAG) IMPLANT
BLADE SURG SZ11 CARB STEEL (BLADE) ×2 IMPLANT
CANNULA REDUCER 12-8 DVNC XI (CANNULA) ×2 IMPLANT
CATH REDDICK CHOLANGI 4FR 50CM (CATHETERS) IMPLANT
CAUTERY HOOK MNPLR 1.6 DVNC XI (INSTRUMENTS) ×2 IMPLANT
CLIP LIGATING HEM O LOK PURPLE (MISCELLANEOUS) IMPLANT
CLIP LIGATING HEMO O LOK GREEN (MISCELLANEOUS) ×2 IMPLANT
DERMABOND ADVANCED .7 DNX12 (GAUZE/BANDAGES/DRESSINGS) ×2 IMPLANT
DRAPE ARM DVNC X/XI (DISPOSABLE) ×8 IMPLANT
DRAPE C-ARM XRAY 36X54 (DRAPES) IMPLANT
DRAPE COLUMN DVNC XI (DISPOSABLE) ×2 IMPLANT
ELECT REM PT RETURN 9FT ADLT (ELECTROSURGICAL) ×2
ELECTRODE REM PT RTRN 9FT ADLT (ELECTROSURGICAL) ×2 IMPLANT
FORCEPS BPLR 8 MD DVNC XI (FORCEP) ×2 IMPLANT
FORCEPS BPLR R/ABLATION 8 DVNC (INSTRUMENTS) ×2 IMPLANT
FORCEPS PROGRASP DVNC XI (FORCEP) ×2 IMPLANT
GLOVE BIO SURGEON STRL SZ 6.5 (GLOVE) ×4 IMPLANT
GLOVE BIOGEL PI IND STRL 6.5 (GLOVE) ×4 IMPLANT
GOWN STRL REUS W/ TWL LRG LVL3 (GOWN DISPOSABLE) ×6 IMPLANT
GOWN STRL REUS W/TWL LRG LVL3 (GOWN DISPOSABLE) ×6
GRASPER SUT TROCAR 14GX15 (MISCELLANEOUS) ×2 IMPLANT
IRRIGATOR SUCT 8 DISP DVNC XI (IRRIGATION / IRRIGATOR) IMPLANT
IV CATH ANGIO 12GX3 LT BLUE (NEEDLE) IMPLANT
IV NS 1000ML (IV SOLUTION)
IV NS 1000ML BAXH (IV SOLUTION) IMPLANT
KIT PINK PAD W/HEAD ARE REST (MISCELLANEOUS) ×2 IMPLANT
KIT PINK PAD W/HEAD ARM REST (MISCELLANEOUS) ×2 IMPLANT
LABEL OR SOLS (LABEL) ×2 IMPLANT
MANIFOLD NEPTUNE II (INSTRUMENTS) ×2 IMPLANT
NDL HYPO 22X1.5 SAFETY MO (MISCELLANEOUS) ×2 IMPLANT
NDL INSUFFLATION 14GA 120MM (NEEDLE) ×2 IMPLANT
NEEDLE HYPO 22X1.5 SAFETY MO (MISCELLANEOUS) ×2 IMPLANT
NEEDLE INSUFFLATION 14GA 120MM (NEEDLE) ×2 IMPLANT
NS IRRIG 500ML POUR BTL (IV SOLUTION) ×2 IMPLANT
OBTURATOR OPTICAL STND 8 DVNC (TROCAR) ×2
OBTURATOR OPTICALSTD 8 DVNC (TROCAR) ×2 IMPLANT
PACK LAP CHOLECYSTECTOMY (MISCELLANEOUS) ×2 IMPLANT
SEAL UNIV 5-12 XI (MISCELLANEOUS) ×8 IMPLANT
SET TUBE SMOKE EVAC HIGH FLOW (TUBING) ×2 IMPLANT
SOL ELECTROSURG ANTI STICK (MISCELLANEOUS) ×2
SOLUTION ELECTROSURG ANTI STCK (MISCELLANEOUS) ×2 IMPLANT
SPIKE FLUID TRANSFER (MISCELLANEOUS) ×4 IMPLANT
SPONGE T-LAP 4X18 ~~LOC~~+RFID (SPONGE) IMPLANT
SUT MNCRL 4-0 (SUTURE) ×4
SUT MNCRL 4-0 27XMFL (SUTURE) ×4
SUT VICRYL 0 UR6 27IN ABS (SUTURE) ×2 IMPLANT
SUTURE MNCRL 4-0 27XMF (SUTURE) ×2 IMPLANT
SYS BAG RETRIEVAL 10MM (BASKET) ×2
SYSTEM BAG RETRIEVAL 10MM (BASKET) ×2 IMPLANT
TRAP FLUID SMOKE EVACUATOR (MISCELLANEOUS) ×2 IMPLANT
WATER STERILE IRR 500ML POUR (IV SOLUTION) ×2 IMPLANT

## 2022-06-25 NOTE — Transfer of Care (Signed)
Immediate Anesthesia Transfer of Care Note  Patient: [name]  Procedure(s) Performed: XI ROBOTIC ASSISTED LAPAROSCOPIC CHOLECYSTECTOMY (Abdomen) INDOCYANINE GREEN FLUORESCENCE IMAGING (ICG)  Patient Location: PACU  Anesthesia Type:General  Level of Consciousness: drowsy and patient cooperative  Airway & Oxygen Therapy: Patient Spontanous Breathing and Patient connected to face mask oxygen  Post-op Assessment: Report given to RN and Post -op Vital signs reviewed and stable  Post vital signs: stable  Last Vitals:  Vitals Value Taken Time  BP 160/71 06/25/22 1422  Temp 36.1 C 06/25/22 1422  Pulse 58 06/25/22 1425  Resp 15 06/25/22 1425  SpO2 100 % 06/25/22 1425  Vitals shown include unvalidated device data.  Last Pain:  Vitals:   06/25/22 1135  TempSrc: Temporal  PainSc: 0-No pain         Complications: No notable events documented.

## 2022-06-25 NOTE — Progress Notes (Signed)
       CROSS COVER NOTE  NAME: [name] MRN: [id number] DOB : 02-20-1941    HPI/Events of Note   Report:Notified by nurse cbg [id number], s/p lap chole on D5LR continuous IV fluids and diet ordered and patient tolerating diet well  On review of chart:BP slightly hypertensive 166/65. HR 70 afebrile    Assessment and  Interventions   Assessment:  Plan: Discontinue continuous IV fluids     Donnie Mesa NP [facility]

## 2022-06-25 NOTE — Op Note (Signed)
Preoperative diagnosis: Cholelithiasis with choledocholithiasis  Postoperative diagnosis: Same  Procedure: Robotic Assisted Laparoscopic Cholecystectomy.   Anesthesia: GETA   Surgeon: Dr. Hazle Quant  Wound Classification: Clean Contaminated  Indications: Patient is a 82 y.o. female developed vomiting.  She went to ED for evaluation.  She was found to wait cholelithiasis to wait elevated bilirubin.  MRCP negative for choledocholithiasis.  Follow-up lipase normalized.  Cholecystectomy indicated for prevention of recurrent episode of passing stones in the common bile duct.  Findings:  Critical view of safety achieved Cystic duct and artery identified, ligated and divided Adequate hemostasis  Description of procedure: The patient was placed on the operating table in the supine position. General anesthesia was induced. A time-out was completed verifying correct patient, procedure, site, positioning, and implant(s) and/or special equipment prior to beginning this procedure. An orogastric tube was placed. The abdomen was prepped and draped in the usual sterile fashion.  An incision was made in a natural skin line below the umbilicus.  The fascia was elevated and the Veress needle inserted. Proper position was confirmed by aspiration and saline meniscus test.  The abdomen was insufflated with carbon dioxide to a pressure of 15 mmHg. The patient tolerated insufflation well. A 8-mm trocar was then inserted in optiview fashion.  The laparoscope was inserted and the abdomen inspected. No injuries from initial trocar placement were noted. Additional trocars were then inserted in the following locations: an 8-mm trocar in the left lateral abdomen, and another two 8-mm trocars to the right side of the abdomen 5 cm appart. The umbilical trocar was changed to a 12 mm trocar all under direct visualization. The abdomen was inspected and no abnormalities were found. The table was placed in the reverse  Trendelenburg position with the right side up. The robotic arms were docked and target anatomy identified. Instrument inserted under direct visualization.  Filmy adhesions between the gallbladder and omentum, duodenum and transverse colon were lysed with electrocautery. The dome of the gallbladder was grasped with a prograsp and retracted over the dome of the liver. The infundibulum was also grasped with an atraumatic grasper and retracted toward the right lower quadrant. This maneuver exposed Calot's triangle. The peritoneum overlying the gallbladder infundibulum was then incised and the cystic duct and cystic artery identified and circumferentially dissected. Critical view of safety reviewed before ligating any structure. Firefly images taken to visualize biliary ducts. The cystic duct and cystic artery were then doubly clipped and divided close to the gallbladder.  The gallbladder was then dissected from its peritoneal attachments by electrocautery. Hemostasis was checked and the gallbladder and contained stones were removed using an endoscopic retrieval bag. The gallbladder was passed off the table as a specimen.  There was no evidence of bleeding from the gallbladder fossa or cystic artery or leakage of the bile from the cystic duct stump. Secondary trocars were removed under direct vision. No bleeding was noted. The robotic arms were undoked. The scope was withdrawn and the umbilical trocar removed. The abdomen was allowed to collapse. The fascia of the 55mm trocar sites was closed with figure-of-eight 0 vicryl sutures. The skin was closed with subcuticular sutures of 4-0 monocryl and topical skin adhesive. The orogastric tube was removed.  The patient tolerated the procedure well and was taken to the postanesthesia care unit in stable condition.   Specimen: Gallbladder  Complications: None  EBL: 5 mL

## 2022-06-25 NOTE — Progress Notes (Signed)
Patient received from the PACU status-post laparoscopic chole , daughter at bedside. Patient resting comfortably. Laparoscopic sites are intact. Patient with oxygen on at 2 liters. VSS with the exception of slightly elevated BP. Patient is drowsy but easily arouses to voice and stimulation.

## 2022-06-25 NOTE — Anesthesia Postprocedure Evaluation (Signed)
Anesthesia Post Note  Patient: [name]  Procedure(s) Performed: XI ROBOTIC ASSISTED LAPAROSCOPIC CHOLECYSTECTOMY (Abdomen) INDOCYANINE GREEN FLUORESCENCE IMAGING (ICG)  Patient location during evaluation: PACU Anesthesia Type: General Level of consciousness: awake and alert, patient cooperative and confused (at preoperative baseline) Pain management: pain level controlled Vital Signs Assessment: post-procedure vital signs reviewed and stable Respiratory status: spontaneous breathing, nonlabored ventilation and respiratory function stable Cardiovascular status: blood pressure returned to baseline and stable Postop Assessment: adequate PO intake Anesthetic complications: no   There were no known notable events for this encounter.   Last Vitals:  Vitals:   06/25/22 1445 06/25/22 1500  BP: (!) 163/57 (!) 148/51  Pulse: 61 (!) 59  Resp: 12 11  Temp:    SpO2: 98% 91%    Last Pain:  Vitals:   06/25/22 1500  TempSrc:   PainSc: Asleep                 Reed Breech

## 2022-06-25 NOTE — Anesthesia Preprocedure Evaluation (Addendum)
Anesthesia Evaluation  Patient identified by MRN, date of birth, ID band Patient awake and Patient confused    Reviewed: Allergy & Precautions, NPO status , Patient's Chart, lab work & pertinent test results  History of Anesthesia Complications Negative for: history of anesthetic complications  Airway Mallampati: II   Neck ROM: Full    Dental  (+) Missing   Pulmonary neg pulmonary ROS   Pulmonary exam normal breath sounds clear to auscultation       Cardiovascular hypertension, Normal cardiovascular exam Rhythm:Regular Rate:Normal  ECG 05/18/22: NSR, RBBB   Neuro/Psych  PSYCHIATRIC DISORDERS     Dementia negative neurological ROS     GI/Hepatic negative GI ROS,,,  Endo/Other  diabetes, Type 1, Insulin Dependent    Renal/GU Renal disease (stage III CKD)     Musculoskeletal  (+) Arthritis ,    Abdominal   Peds  Hematology Breast CA   Anesthesia Other Findings   Reproductive/Obstetrics                             Anesthesia Physical Anesthesia Plan  ASA: 2  Anesthesia Plan: General   Post-op Pain Management:    Induction: Intravenous  PONV Risk Score and Plan: 3 and Ondansetron, Dexamethasone and Treatment may vary due to age or medical condition  Airway Management Planned: Oral ETT  Additional Equipment:   Intra-op Plan:   Post-operative Plan: Extubation in OR  Informed Consent: I have reviewed the patients History and Physical, chart, labs and discussed the procedure including the risks, benefits and alternatives for the proposed anesthesia with the patient or authorized representative who has indicated his/her understanding and acceptance.     Dental advisory given and Consent reviewed with POA  Plan Discussed with: CRNA  Anesthesia Plan Comments: (Patient's daughter and POA, Shaianne Grossberg, (at bedside) consented for risks of anesthesia including but not limited to:  -  adverse reactions to medications - damage to eyes, teeth, lips or other oral mucosa - nerve damage due to positioning  - sore throat or hoarseness - damage to heart, brain, nerves, lungs, other parts of body or loss of life  Informed patient's daughter about role of CRNA in peri- and intra-operative care; she voiced understanding.)        Anesthesia Quick Evaluation

## 2022-06-25 NOTE — Progress Notes (Signed)
[facility] Progress Note  Patient: Judyth Demarais    UJW:[id number]  DOA: 06/23/2022     Date of Service: the patient was seen and examined on 06/25/2022  Chief Complaint  Patient presents with   Emesis   Brief hospital course: Brayden Betters is a 82 y.o. female with medical history significant for Diabetes, hypertension, cholelithiasis, dementia, left breast cancer s/p modified radical mastectomy on 05/15/2022, who presents to the ED with right-sided abdominal pain and vomiting.  The symptoms started after having lunch however since arriving to the ED the pain has resolved and with no further vomiting ED course and data review: BP 112/53 with otherwise normal vitals.  Labs significant for abnormal LFTs with AST 343, ALT 249, alk phos 151 and total bili 1.8.  Lipase normal at 31.  WBC normal.  Glucose elevated at 249 Right upper quadrant ultrasound showed cholelithiasis without sonographic evidence of acute cholecystitis with CBD diameter of 2 mm. Patient was given IV insulin in the ED her blood sugar. Hospitalist consulted for admission.   Assessment and Plan:  # Cholelithiasis without cholecystitis Biliary colic, presented with acute abdominal pain which has been subsided. Elevated LFTs and normal lipase, Likely passed stone abnormal LFTs with AST 343, ALT 249, alk phos 151 and total bili 1.8.  Lipase normal at 31. CBD diameter 2 mm MRCP reviewed, showed cholelithiasis, no any other significant findings. LFTs alk phos 188, ALT 319 slightly elevated, AST 272 improved, bilirubin 1.0 improved. General surgery consulted, recommended lap chole and scheduled for today. Patient is n.p.o.    # Type 1 diabetes mellitus with hyperglycemia, with long-term current use of insulin Sliding scale insulin coverage   Breast cancer s/p  unilateral modified radical mastectomy, left 05/2022 Continue Femara   Dementia Delirium precautions  continue Aricept   Hypertension associated with diabetes  (HCC) Hydralazine as needed while n.p.o.   Body mass index is 25.61 kg/m.  Interventions:    Diet: NPO for lap chole DVT Prophylaxis: Subcutaneous Lovenox   Advance goals of care discussion: Full code  Family Communication: family was present at bedside, at the time of interview.  The pt provided permission to discuss medical plan with the family. Opportunity was given to ask question and all questions were answered satisfactorily.   Disposition:  Pt is from Home, admitted with biliary colic, scheduled for lap chole today, which precludes a safe discharge. Discharge to Home, when cleared by general surgery, most likely tomorrow a.m.  Subjective: No significant event overnight.  Patient is pain-free, denies any abdominal pain, no nausea or vomiting.  No chest pain or palpitations, no shortness of breath.  Physical Exam: General: NAD, lying comfortably Appear in no distress, affect appropriate Eyes: PERRLA ENT: Oral Mucosa Clear, moist  Neck: no JVD,  Cardiovascular: S1 and S2 Present, no Murmur,  Respiratory: good respiratory effort, Bilateral Air entry equal and Decreased, no Crackles, no wheezes Abdomen: Bowel Sound present, Soft and no tenderness,  Skin: no rashes Extremities: no Pedal edema, no calf tenderness Neurologic: without any new focal findings Gait not checked due to patient safety concerns  Vitals:   06/24/22 1556 06/24/22 1947 06/25/22 0358 06/25/22 0748  BP: (!) 140/65 (!) 152/72 (!) 142/66 (!) 155/60  Pulse: 60 70 64 (!) 55  Resp: Temp: 97.8 F (36.6 C) 98.4 F (36.9 C) 98 F (36.7 C) 98.4 F (36.9 C)  TempSrc: Oral     SpO2: 99% 99% 98% 98%  Weight:  Intake/Output Summary (Last 24 hours) at 06/25/2022 1104 Last data filed at 06/25/2022 0836 Gross per 24 hour  Intake 487.39 ml  Output 1500 ml  Net -1012.61 ml   Filed Weights   06/23/22 1745  Weight: 63.5 kg    Data Reviewed: I have personally reviewed and interpreted  daily labs, tele strips, imagings as discussed above. I reviewed all nursing notes, pharmacy notes, vitals, pertinent old records I have discussed plan of care as described above with RN and patient/family.  CBC: Recent Labs  Lab 06/23/22 1748 06/25/22 0505  WBC 9.6 5.4  HGB 12.1 11.9*  HCT 36.5 35.0*  MCV 89.2 87.3  PLT 243 201   Basic Metabolic Panel: Recent Labs  Lab 06/23/22 1748 06/24/22 0133 06/25/22 0505  NA 136  --  139  K 3.7  --  3.3*  CL 100  --  104  CO2 25  --  27  GLUCOSE 249*  --  234*  BUN 20  --  8  CREATININE 0.72  --  0.53  CALCIUM 9.4  --  8.9  MG  --  1.9 1.7  PHOS  --  4.6 3.0    Studies: MR ABDOMEN MRCP WO CONTRAST  Result Date: 06/24/2022 CLINICAL DATA:  Jaundice and cholelithiasis. History of diabetes and breast cancer. EXAM: MRI ABDOMEN WITHOUT CONTRAST  (INCLUDING MRCP) TECHNIQUE: Multiplanar multisequence MR imaging of the abdomen was performed. Heavily T2-weighted images of the biliary and pancreatic ducts were obtained, and three-dimensional MRCP images were rendered by post processing. COMPARISON:  Abdominal ultrasound 06/23/2022. PET-CT 04/28/2022. Abdominopelvic CT 01/24/2012. FINDINGS: Despite efforts by the technologist and patient, moderate to severe motion artifact is present on today's exam and could not be eliminated. This reduces exam sensitivity and specificity. Lower chest: No significant findings are seen within the visualized lower chest status post left mastectomy. Hepatobiliary: No focal hepatic abnormalities are identified. As demonstrated on PET-CT, there are innumerable calcified gallstones throughout the gallbladder. No evidence of gallbladder wall thickening or surrounding inflammation. The common hepatic duct measures 6 mm in diameter and there is no intrahepatic biliary dilatation. No definite intraductal calculi are demonstrated, although evaluation is limited by the motion. Pancreas: Unremarkable. No pancreatic ductal  dilatation or surrounding inflammatory changes. Spleen: Normal in size without focal abnormality. Adrenals/Urinary Tract: Both adrenal glands appear normal. Small bilateral renal cysts for which no follow-up imaging is recommended. No hydronephrosis. Stomach/Bowel: The stomach appears unremarkable for its degree of distension. No evidence of bowel wall thickening, distention or surrounding inflammatory change. Vascular/Lymphatic: There are no enlarged abdominal lymph nodes. Aortic and branch vessel atherosclerosis without evidence of aneurysm. Other: Bilateral flank edema, asymmetric to the left. No ascites or focal extraluminal fluid collection. Uterine fibroids are partially imaged. Musculoskeletal: No acute or significant osseous findings. IMPRESSION: 1. Cholelithiasis without evidence of cholecystitis. 2. No evidence of biliary dilatation or definite choledocholithiasis, although evaluation is limited by motion. 3. No acute abdominal findings. 4. Bilateral flank edema, asymmetric to the left. Electronically Signed   By: Carey Bullocks M.D.   On: 06/24/2022 14:54   MR 3D Recon At Scanner  Result Date: 06/24/2022 CLINICAL DATA:  Jaundice and cholelithiasis. History of diabetes and breast cancer. EXAM: MRI ABDOMEN WITHOUT CONTRAST  (INCLUDING MRCP) TECHNIQUE: Multiplanar multisequence MR imaging of the abdomen was performed. Heavily T2-weighted images of the biliary and pancreatic ducts were obtained, and three-dimensional MRCP images were rendered by post processing. COMPARISON:  Abdominal ultrasound 06/23/2022. PET-CT 04/28/2022. Abdominopelvic CT 01/24/2012. FINDINGS:  Despite efforts by the technologist and patient, moderate to severe motion artifact is present on today's exam and could not be eliminated. This reduces exam sensitivity and specificity. Lower chest: No significant findings are seen within the visualized lower chest status post left mastectomy. Hepatobiliary: No focal hepatic abnormalities are  identified. As demonstrated on PET-CT, there are innumerable calcified gallstones throughout the gallbladder. No evidence of gallbladder wall thickening or surrounding inflammation. The common hepatic duct measures 6 mm in diameter and there is no intrahepatic biliary dilatation. No definite intraductal calculi are demonstrated, although evaluation is limited by the motion. Pancreas: Unremarkable. No pancreatic ductal dilatation or surrounding inflammatory changes. Spleen: Normal in size without focal abnormality. Adrenals/Urinary Tract: Both adrenal glands appear normal. Small bilateral renal cysts for which no follow-up imaging is recommended. No hydronephrosis. Stomach/Bowel: The stomach appears unremarkable for its degree of distension. No evidence of bowel wall thickening, distention or surrounding inflammatory change. Vascular/Lymphatic: There are no enlarged abdominal lymph nodes. Aortic and branch vessel atherosclerosis without evidence of aneurysm. Other: Bilateral flank edema, asymmetric to the left. No ascites or focal extraluminal fluid collection. Uterine fibroids are partially imaged. Musculoskeletal: No acute or significant osseous findings. IMPRESSION: 1. Cholelithiasis without evidence of cholecystitis. 2. No evidence of biliary dilatation or definite choledocholithiasis, although evaluation is limited by motion. 3. No acute abdominal findings. 4. Bilateral flank edema, asymmetric to the left. Electronically Signed   By: Carey Bullocks M.D.   On: 06/24/2022 14:54    Scheduled Meds:  amLODipine  5 mg Oral Daily   donepezil  10 mg Oral QHS   indocyanine green  1.25 mg Intravenous Once   insulin aspart  0-9 Units Subcutaneous Q4H   letrozole  2.5 mg Oral Daily   potassium chloride  40 mEq Oral Once   Continuous Infusions:  dextrose 5% lactated ringers 75 mL/hr at 06/25/22 0403   PRN Meds: acetaminophen **OR** acetaminophen, hydrALAZINE, morphine injection, ondansetron **OR** ondansetron  (ZOFRAN) IV  Time spent: 35 minutes  Author: Gillis Santa. MD [facility] 06/25/2022 11:04 AM  To reach On-call, see care teams to locate the attending and reach out to them via [email or web address]. If 7PM-7AM, please contact night-coverage If you still have difficulty reaching the attending provider, please page the [facility] (Director on Call) for [facility] on amion for assistance.

## 2022-06-25 NOTE — Anesthesia Procedure Notes (Signed)
Anesthesia Procedure Image    

## 2022-06-25 NOTE — Inpatient Diabetes Management (Signed)
Inpatient Diabetes Program Recommendations  AACE/ADA: New Consensus Statement on Inpatient Glycemic Control (2015)  Target Ranges:  Prepandial:   less than 140 mg/dL      Peak postprandial:   less than 180 mg/dL (1-2 hours)      Critically ill patients:  140 - 180 mg/dL   Lab Results  Component Value Date   GLUCAP 218 (H) 06/25/2022   HGBA1C 6.5 (H) 06/23/2022    Review of Glycemic Control  Latest Reference Range & Units 06/24/22 16:00 06/24/22 19:49 06/24/22 23:37 06/25/22 03:54 06/25/22 07:49 06/25/22 11:47 06/25/22 14:22  Glucose-Capillary 70 - 99 mg/dL 387 (H) 564 (H) 332 (H) 227 (H) 196 (H) 123 (H) 218 (H)  Diabetes history:  DM Outpatient Diabetes medications:  Novolog 6-10 units tid with meals  Tresiba 22 units daily Current orders for Inpatient glycemic control:  Novolog sensitive q 4 hours  Inpatient Diabetes Program Recommendations:    Consider adding Semglee 8 units daily.  (Anticipate that blood sugars will increase as patient did receive Decadron 10 mg intra-op).   Thanks,  Beryl Meager, RN, BC-ADM Inpatient Diabetes Coordinator Pager [phone]  (8a-5p)

## 2022-06-25 NOTE — Progress Notes (Signed)
Patient ID: [name], female   DOB: 11/10/1940, 82 y.o.   MRN: [id number]     SURGICAL PROGRESS NOTE   Hospital Day(s): 1.   Interval History: Patient seen and examined, no acute events or new complaints overnight. Patient reports feeling well this morning.  She denies any nausea after eating for liquid diet.  Denies abdominal pain.  Daughter at bedside and denies any issues overnight.  Vital signs in last 24 hours: [min-max] current  Temp:  [97.7 F (36.5 C)-98.4 F (36.9 C)] 98 F (36.7 C) (04/18 0358) Pulse Rate:  [57-70] 64 (04/18 0358) Resp:  [14-19] 16 (04/18 0358) BP: (137-166)/(56-76) 142/66 (04/18 0358) SpO2:  [97 %-100 %] 98 % (04/18 0358)       Weight: 63.5 kg     Physical Exam:  Constitutional: alert, cooperative and no distress  Respiratory: breathing non-labored at rest  Cardiovascular: regular rate and sinus rhythm  Gastrointestinal: soft, non-tender, and non-distended  Labs:     Latest Ref Rng & Units 06/25/2022    5:05 AM 06/23/2022    5:48 PM 05/26/2022    6:53 AM  CBC  WBC 4.0 - 10.5 K/uL 5.4  9.6  8.9   Hemoglobin 12.0 - 15.0 g/dL 40.9  81.1  91.4   Hematocrit 36.0 - 46.0 % 35.0  36.5  30.3   Platelets 150 - 400 K/uL 201  243  172       Latest Ref Rng & Units 06/25/2022    5:05 AM 06/24/2022    1:33 AM 06/23/2022    5:48 PM  CMP  Glucose 70 - 99 mg/dL 782   956   BUN 8 - 23 mg/dL 8   20   Creatinine 2.13 - 1.00 mg/dL 0.86   5.78   Sodium 469 - 145 mmol/L 139   136   Potassium 3.5 - 5.1 mmol/L 3.3   3.7   Chloride 98 - 111 mmol/L 104   100   CO2 22 - 32 mmol/L 27   25   Calcium 8.9 - 10.3 mg/dL 8.9   9.4   Total Protein 6.5 - 8.1 g/dL 6.7  7.0  7.1    7.2   Total Bilirubin 0.3 - 1.2 mg/dL 1.0  1.8  1.5    1.4   Alkaline Phos 38 - 126 U/L 188  151  122    120   AST 15 - 41 U/L 272  343  232    238   ALT 0 - 44 U/L 319  249  123    126     Imaging studies: No new pertinent imaging studies   Assessment/Plan:  82 y.o. female with nausea  and vomiting with cholelithiasis and elevated liver enzymes, complicated by pertinent comorbidities including Alzheimer's dementia, breast cancer.   Cholelithiasis with jaundice  -MRCP with motion artifact but no obvious choledocholithiasis.  No common bile duct dilation. -Today bilirubin normalized to 1.0 with almost normal direct bilirubin. -Most likely patient had an episode of choledocholithiasis that resolved spontaneously -I discussed with daughter that the usual recommendation after choledocholithiasis is to do cholecystectomy to prevent another episode -Risk versus benefit discussed. -At this point the daughter and the husband would like to proceed with robotic assisted laparoscopic cholecystectomy -I discussed the risks of bleeding, infection, bile leak, injury to common bile duct and adjacent organs, retained stone, worsening of dementia, among others.  The patient's daughter report she understood and agreed to proceed with robotic assisted laparoscopic  cholecystectomy.   Gae Gallop, MD

## 2022-06-25 NOTE — Anesthesia Procedure Notes (Signed)
Procedure Name: Intubation Date/Time: 06/25/2022 1:01 PM  Performed by: Maryla Morrow., CRNAPre-anesthesia Checklist: Patient identified, Patient being monitored, Timeout performed, Emergency Drugs available and Suction available Patient Re-evaluated:Patient Re-evaluated prior to induction Oxygen Delivery Method: Circle system utilized Preoxygenation: Pre-oxygenation with 100% oxygen Induction Type: IV induction Ventilation: Mask ventilation without difficulty Laryngoscope Size: 3 and McGraph Grade View: Grade I Tube type: Oral Tube size: 7.0 mm Number of attempts: 1 Airway Equipment and Method: Stylet Placement Confirmation: ETT inserted through vocal cords under direct vision, positive ETCO2 and breath sounds checked- equal and bilateral Secured at: 19 cm Tube secured with: Tape Dental Injury: Teeth and Oropharynx as per pre-operative assessment

## 2022-06-26 DIAGNOSIS — K8050 Calculus of bile duct without cholangitis or cholecystitis without obstruction: Secondary | ICD-10-CM | POA: Diagnosis not present

## 2022-06-26 LAB — CBC
HCT: 33.8 % — ABNORMAL LOW (ref 36.0–46.0)
Hemoglobin: 11.5 g/dL — ABNORMAL LOW (ref 12.0–15.0)
MCH: 29.9 pg (ref 26.0–34.0)
MCHC: 34 g/dL (ref 30.0–36.0)
MCV: 87.8 fL (ref 80.0–100.0)
Platelets: 194 10*3/uL (ref 150–400)
RBC: 3.85 MIL/uL — ABNORMAL LOW (ref 3.87–5.11)
RDW: 13.7 % (ref 11.5–15.5)
WBC: 7.1 10*3/uL (ref 4.0–10.5)
nRBC: 0 % (ref 0.0–0.2)

## 2022-06-26 LAB — HEPATIC FUNCTION PANEL
ALT: 560 U/L — ABNORMAL HIGH (ref 0–44)
AST: 693 U/L — ABNORMAL HIGH (ref 15–41)
Albumin: 3.1 g/dL — ABNORMAL LOW (ref 3.5–5.0)
Alkaline Phosphatase: 341 U/L — ABNORMAL HIGH (ref 38–126)
Bilirubin, Direct: 1 mg/dL — ABNORMAL HIGH (ref 0.0–0.2)
Indirect Bilirubin: 0.8 mg/dL (ref 0.3–0.9)
Total Bilirubin: 1.8 mg/dL — ABNORMAL HIGH (ref 0.3–1.2)
Total Protein: 6.5 g/dL (ref 6.5–8.1)

## 2022-06-26 LAB — LIPASE, BLOOD: Lipase: 22 U/L (ref 11–51)

## 2022-06-26 LAB — BASIC METABOLIC PANEL
Anion gap: 9 (ref 5–15)
BUN: 17 mg/dL (ref 8–23)
CO2: 25 mmol/L (ref 22–32)
Calcium: 9 mg/dL (ref 8.9–10.3)
Chloride: 104 mmol/L (ref 98–111)
Creatinine, Ser: 0.83 mg/dL (ref 0.44–1.00)
GFR, Estimated: >60 mL/min (ref >60)
Glucose, Bld: 236 mg/dL — ABNORMAL HIGH (ref 70–99)
Potassium: 3.8 mmol/L (ref 3.5–5.1)
Sodium: 138 mmol/L (ref 135–145)

## 2022-06-26 LAB — PHOSPHORUS: Phosphorus: 3.4 mg/dL (ref 2.5–4.6)

## 2022-06-26 LAB — GLUCOSE, CAPILLARY
Glucose-Capillary: 242 mg/dL — ABNORMAL HIGH (ref 70–99)
Glucose-Capillary: 193 mg/dL — ABNORMAL HIGH (ref 70–99)
Glucose-Capillary: 267 mg/dL — ABNORMAL HIGH (ref 70–99)
Glucose-Capillary: 309 mg/dL — ABNORMAL HIGH (ref 70–99)
Glucose-Capillary: 261 mg/dL — ABNORMAL HIGH (ref 70–99)

## 2022-06-26 LAB — MAGNESIUM: Magnesium: 1.7 mg/dL (ref 1.7–2.4)

## 2022-06-26 MED ORDER — HYDROCHLOROTHIAZIDE 12.5 MG PO TABS
12.5000 mg | ORAL_TABLET | Freq: Every day | ORAL | Status: DC
  Administered 2022-06-26 – 2022-06-27 (×2): 12.5 mg via ORAL
  Filled 2022-06-26 (×2): qty 1

## 2022-06-26 MED ORDER — OYSTER SHELL CALCIUM/D3 500-5 MG-MCG PO TABS
1.0000 | ORAL_TABLET | Freq: Two times a day (BID) | ORAL | Status: DC
  Administered 2022-06-26 – 2022-06-27 (×4): 1 via ORAL
  Filled 2022-06-26 (×4): qty 1

## 2022-06-26 MED ORDER — LOSARTAN POTASSIUM-HCTZ 100-12.5 MG PO TABS: 1.0000 | ORAL_TABLET | Freq: Every day | ORAL | Status: DC

## 2022-06-26 MED ORDER — LOSARTAN POTASSIUM 50 MG PO TABS
100.0000 mg | ORAL_TABLET | Freq: Every day | ORAL | Status: DC
  Administered 2022-06-26 – 2022-06-27 (×2): 100 mg via ORAL
  Filled 2022-06-26 (×2): qty 2

## 2022-06-26 NOTE — Progress Notes (Signed)
Patient ID: [name], female   DOB: 08-01-1940, 82 y.o.   MRN: [id number]     SURGICAL PROGRESS NOTE   Hospital Day(s): 2.   Interval History: Patient seen and examined, no acute events or new complaints overnight. Patient reports she feels well.  Patient does not remember that she had surgery yesterday.  She denies any abdominal pain.  She denies any nausea or vomiting.  She tolerated soup yesterday  Vital signs in last 24 hours: [min-max] current  Temp:  [97 F (36.1 C)-98.7 F (37.1 C)] 98.1 F (36.7 C) (04/19 0820) Pulse Rate:  [44-70] 59 (04/19 0820) Resp:  [11-20] 17 (04/19 0820) BP: (123-173)/(47-80) 149/52 (04/19 0820) SpO2:  [91 %-100 %] 98 % (04/19 0820)       Weight: 63.5 kg     Physical Exam:  Constitutional: alert, cooperative and no distress  Respiratory: breathing non-labored at rest  Cardiovascular: regular rate and sinus rhythm  Gastrointestinal: soft, non-tender, and non-distended.  The wounds are dry and clean  Labs:     Latest Ref Rng & Units 06/26/2022    5:00 AM 06/25/2022    5:05 AM 06/23/2022    5:48 PM  CBC  WBC 4.0 - 10.5 K/uL 7.1  5.4  9.6   Hemoglobin 12.0 - 15.0 g/dL 13.2  44.0  10.2   Hematocrit 36.0 - 46.0 % 33.8  35.0  36.5   Platelets 150 - 400 K/uL 194  201  243       Latest Ref Rng & Units 06/26/2022    5:00 AM 06/25/2022    5:05 AM 06/24/2022    1:33 AM  CMP  Glucose 70 - 99 mg/dL 725  366    BUN 8 - 23 mg/dL 17  8    Creatinine 4.40 - 1.00 mg/dL 3.47  4.25    Sodium 956 - 145 mmol/L 138  139    Potassium 3.5 - 5.1 mmol/L 3.8  3.3    Chloride 98 - 111 mmol/L 104  104    CO2 22 - 32 mmol/L 25  27    Calcium 8.9 - 10.3 mg/dL 9.0  8.9    Total Protein 6.5 - 8.1 g/dL 6.5  6.7  7.0   Total Bilirubin 0.3 - 1.2 mg/dL 1.8  1.0  1.8   Alkaline Phos 38 - 126 U/L 341  188  151   AST 15 - 41 U/L 693  272  343   ALT 0 - 44 U/L 560  319  249     Imaging studies: No new pertinent imaging studies   Assessment/Plan:  82 y.o. female with  choledocholithiasis 1 Day Post-Op s/p robotic cholecystectomy, complicated by pertinent comorbidities including Alzheimer's dementia, breast cancer.  -Had cholecystectomy yesterday -Today total bilirubin and direct bilirubin elevated again.  Liver enzymes also trending up. -Patient is currently asymptomatic without nausea like she was presented before surgery.  At this point I think the patient will benefit of further observation for another 24 hours with repeating labs in the morning to see if bilirubin started to trend down. -The elevation of the liver enzymes and bilirubin could be manipulation from the surgery or a retained stone.  If the bilirubin trends up tomorrow I would recommend MRCP tomorrow. -Continue soft diet today as tolerated -Patient may ambulate   Gae Gallop, MD

## 2022-06-26 NOTE — Progress Notes (Signed)
Mobility Specialist - Progress Note   06/26/22 1547  Mobility  Activity Ambulated with assistance in room;Transferred from chair to bed;Ambulated with assistance in hallway  Level of Assistance Standby assist, set-up cues, supervision of patient - no hands on  Assistive Device Front wheel walker  Distance Ambulated (ft) 20 ft  Activity Response Tolerated well  $Mobility charge 1 Mobility   Pt sitting in recliner upon entry, utilizing RA. Pt agreeable to amb in room this date. Pt STS to RW MinA, min VCs for hand placement and manual manipulation of RW throughout session. Pt amb to the NS and back, occasionally looks down while amb-- self correction applied when mentioned. Pt denied any dizziness or SOB, however had slight pain in incision site stating "I can feel it". Pt returned to room, took a few backwards steps before sitting EOB. Once seated EOB Pt required MaxA for trunk and BLE to return supine. Pt left supine with alarm set and needs within reach, family member present at bedside. RN notified.   Zetta Bills Mobility Specialist 06/26/22 3:55 PM

## 2022-06-26 NOTE — Progress Notes (Signed)
[facility] Progress Note  Patient: [name]    ZOX:[id number]  DOA: 06/23/2022     Date of Service: the patient was seen and examined on 06/26/2022  Chief Complaint  Patient presents with   Emesis   Brief hospital course: [name] is a 82 y.o. female with medical history significant for Diabetes, hypertension, cholelithiasis, dementia, left breast cancer s/p modified radical mastectomy on 05/15/2022, who presents to the ED with right-sided abdominal pain and vomiting.  The symptoms started after having lunch however since arriving to the ED the pain has resolved and with no further vomiting ED course and data review: BP 112/53 with otherwise normal vitals.  Labs significant for abnormal LFTs with AST 343, ALT 249, alk phos 151 and total bili 1.8.  Lipase normal at 31.  WBC normal.  Glucose elevated at 249 Right upper quadrant ultrasound showed cholelithiasis without sonographic evidence of acute cholecystitis with CBD diameter of 2 mm. Patient was given IV insulin in the ED her blood sugar. Hospitalist consulted for admission.   Assessment and Plan:  # Cholelithiasis without cholecystitis Biliary colic, presented with acute abdominal pain which has been subsided. Elevated LFTs and normal lipase, Likely passed stone abnormal LFTs with AST 343, ALT 249, alk phos 151 and total bili 1.8.  Lipase normal at 31. CBD diameter 2 mm MRCP reviewed, showed cholelithiasis, no any other significant findings. LFTs alk phos 188, ALT 319 slightly elevated, AST 272 improved, bilirubin 1.0 improved. General surgery consulted, s/p lap chole done on 4/18 4/19, elevated LFTs As per general surgery monitor today, repeat labs tomorrow a.m. and then decide for disposition.    # Type 1 diabetes mellitus with hyperglycemia, with long-term current use of insulin Sliding scale insulin coverage   Breast cancer s/p  unilateral modified radical mastectomy, left 05/2022 Continue Femara    Dementia Delirium precautions  continue Aricept   Hypertension associated with diabetes (HCC) Hydralazine as needed while n.p.o.   Body mass index is 25.61 kg/m.  Interventions:    Diet: Soft/carb modified diet DVT Prophylaxis: Subcutaneous Lovenox   Advance goals of care discussion: Full code  Family Communication: family was present at bedside, at the time of interview.  The pt provided permission to discuss medical plan with the family. Opportunity was given to ask question and all questions were answered satisfactorily.   Disposition:  Pt is from Home, admitted with biliary colic, scheduled for lap chole today, which precludes a safe discharge. Discharge to Home, when cleared by general surgery, most likely tomorrow a.m.  Subjective: No significant event overnight.  Patient was complaining of mild pain in the abdomen, no any other active issues, resting comfortably.  No nausea or vomiting.  No chest pain or palpitation, no shortness of breath.  Physical Exam: General: NAD, lying comfortably Appear in no distress, affect appropriate Eyes: PERRLA ENT: Oral Mucosa Clear, moist  Neck: no JVD,  Cardiovascular: S1 and S2 Present, no Murmur,  Respiratory: good respiratory effort, Bilateral Air entry equal and Decreased, no Crackles, no wheezes Abdomen: Bowel Sound present, Soft and mild epigastric tenderness,  Skin: no rashes Extremities: no Pedal edema, no calf tenderness Neurologic: without any new focal findings Gait not checked due to patient safety concerns  Vitals:   06/25/22 1800 06/25/22 2041 06/26/22 0345 06/26/22 0820  BP: (!) 167/71 (!) 166/65 (!) 123/47 (!) 149/52  Pulse: 67 70 (!) 56 (!) 59  Resp: Temp: 97.6 F (36.4 C) 98.7 F (37.1  C) 97.7 F (36.5 C) 98.1 F (36.7 C)  TempSrc: Oral  Oral   SpO2: 100% 99% 97% 98%  Weight:        Intake/Output Summary (Last 24 hours) at 06/26/2022 1333 Last data filed at 06/26/2022 0955 Gross per 24  hour  Intake 240 ml  Output 600 ml  Net -360 ml   Filed Weights   06/23/22 1745  Weight: 63.5 kg    Data Reviewed: I have personally reviewed and interpreted daily labs, tele strips, imagings as discussed above. I reviewed all nursing notes, pharmacy notes, vitals, pertinent old records I have discussed plan of care as described above with RN and patient/family.  CBC: Recent Labs  Lab 06/23/22 1748 06/25/22 0505 06/26/22 0500  WBC 9.6 5.4 7.1  HGB 12.1 11.9* 11.5*  HCT 36.5 35.0* 33.8*  MCV 89.2 87.3 87.8  PLT 243 201 194   Basic Metabolic Panel: Recent Labs  Lab 06/23/22 1748 06/24/22 0133 06/25/22 0505 06/26/22 0500  NA 136  --  139 138  K 3.7  --  3.3* 3.8  CL 100  --  104 104  CO2 25  --  27 25  GLUCOSE 249*  --  234* 236*  BUN 20  --  8 17  CREATININE 0.72  --  0.53 0.83  CALCIUM 9.4  --  8.9 9.0  MG  --  1.9 1.7 1.7  PHOS  --  4.6 3.0 3.4    Studies: No results found.  Scheduled Meds:  amLODipine  5 mg Oral Daily   calcium-vitamin D  1 tablet Oral BID   donepezil  10 mg Oral QHS   enoxaparin (LOVENOX) injection  40 mg Subcutaneous QPM   losartan  100 mg Oral Daily   And   hydrochlorothiazide  12.5 mg Oral Daily   insulin aspart  0-9 Units Subcutaneous Q4H   insulin glargine-yfgn  8 Units Subcutaneous Daily   letrozole  2.5 mg Oral Daily   potassium chloride  40 mEq Oral Once   Continuous Infusions:   PRN Meds: acetaminophen **OR** acetaminophen, hydrALAZINE, morphine injection, ondansetron **OR** ondansetron (ZOFRAN) IV  Time spent: 35 minutes  Author: Gillis Stokes. MD [facility] 06/26/2022 1:33 PM  To reach On-call, see care teams to locate the attending and reach out to them via [email or web address]. If 7PM-7AM, please contact night-coverage If you still have difficulty reaching the attending provider, please page the [facility] (Director on Call) for [facility] on amion for assistance.

## 2022-06-27 ENCOUNTER — Encounter (HOSPITAL_COMMUNITY): Payer: Self-pay

## 2022-06-27 ENCOUNTER — Inpatient Hospital Stay: Payer: Medicare HMO

## 2022-06-27 DIAGNOSIS — K8050 Calculus of bile duct without cholangitis or cholecystitis without obstruction: Secondary | ICD-10-CM | POA: Diagnosis present

## 2022-06-27 LAB — HEPATIC FUNCTION PANEL
ALT: 672 U/L — ABNORMAL HIGH (ref 0–44)
AST: 626 U/L — ABNORMAL HIGH (ref 15–41)
Albumin: 3.2 g/dL — ABNORMAL LOW (ref 3.5–5.0)
Alkaline Phosphatase: 372 U/L — ABNORMAL HIGH (ref 38–126)
Bilirubin, Direct: 1.3 mg/dL — ABNORMAL HIGH (ref 0.0–0.2)
Indirect Bilirubin: 1.2 mg/dL — ABNORMAL HIGH (ref 0.3–0.9)
Total Bilirubin: 2.5 mg/dL — ABNORMAL HIGH (ref 0.3–1.2)
Total Protein: 6.8 g/dL (ref 6.5–8.1)

## 2022-06-27 LAB — CBC
HCT: 35.2 % — ABNORMAL LOW (ref 36.0–46.0)
Hemoglobin: 12 g/dL (ref 12.0–15.0)
MCH: 29.6 pg (ref 26.0–34.0)
MCHC: 34.1 g/dL (ref 30.0–36.0)
MCV: 86.9 fL (ref 80.0–100.0)
Platelets: 187 10*3/uL (ref 150–400)
RBC: 4.05 MIL/uL (ref 3.87–5.11)
RDW: 13.8 % (ref 11.5–15.5)
WBC: 8.1 10*3/uL (ref 4.0–10.5)
nRBC: 0 % (ref 0.0–0.2)

## 2022-06-27 LAB — BASIC METABOLIC PANEL
Anion gap: 7 (ref 5–15)
BUN: 16 mg/dL (ref 8–23)
CO2: 28 mmol/L (ref 22–32)
Calcium: 9.4 mg/dL (ref 8.9–10.3)
Chloride: 103 mmol/L (ref 98–111)
Creatinine, Ser: 0.58 mg/dL (ref 0.44–1.00)
GFR, Estimated: >60 mL/min (ref >60)
Glucose, Bld: 195 mg/dL — ABNORMAL HIGH (ref 70–99)
Potassium: 3.3 mmol/L — ABNORMAL LOW (ref 3.5–5.1)
Sodium: 138 mmol/L (ref 135–145)

## 2022-06-27 LAB — GLUCOSE, CAPILLARY
Glucose-Capillary: 185 mg/dL — ABNORMAL HIGH (ref 70–99)
Glucose-Capillary: 190 mg/dL — ABNORMAL HIGH (ref 70–99)
Glucose-Capillary: 190 mg/dL — ABNORMAL HIGH (ref 70–99)
Glucose-Capillary: 202 mg/dL — ABNORMAL HIGH (ref 70–99)
Glucose-Capillary: 238 mg/dL — ABNORMAL HIGH (ref 70–99)
Glucose-Capillary: 242 mg/dL — ABNORMAL HIGH (ref 70–99)
Glucose-Capillary: 258 mg/dL — ABNORMAL HIGH (ref 70–99)
Glucose-Capillary: 260 mg/dL — ABNORMAL HIGH (ref 70–99)

## 2022-06-27 LAB — MAGNESIUM: Magnesium: 1.7 mg/dL (ref 1.7–2.4)

## 2022-06-27 LAB — PHOSPHORUS: Phosphorus: 2.8 mg/dL (ref 2.5–4.6)

## 2022-06-27 LAB — LIPASE, BLOOD: Lipase: 38 U/L (ref 11–51)

## 2022-06-27 MED ORDER — MAGNESIUM SULFATE IN D5W 1-5 GM/100ML-% IV SOLN
1.0000 g | Freq: Once | INTRAVENOUS | Status: AC
  Administered 2022-06-27: 1 g via INTRAVENOUS
  Filled 2022-06-27: qty 100

## 2022-06-27 MED ORDER — CIPROFLOXACIN IN D5W 400 MG/200ML IV SOLN
400.0000 mg | Freq: Two times a day (BID) | INTRAVENOUS | Status: DC
  Administered 2022-06-27: 400 mg via INTRAVENOUS
  Filled 2022-06-27 (×2): qty 200

## 2022-06-27 MED ORDER — INSULIN ASPART 100 UNIT/ML IJ SOLN
0.0000 [IU] | INTRAMUSCULAR | Status: DC
  Administered 2022-06-27: 3 [IU] via SUBCUTANEOUS
  Administered 2022-06-27: 5 [IU] via SUBCUTANEOUS
  Filled 2022-06-27: qty 1

## 2022-06-27 MED ORDER — METRONIDAZOLE 500 MG/100ML IV SOLN
500.0000 mg | Freq: Two times a day (BID) | INTRAVENOUS | Status: DC
  Administered 2022-06-27: 500 mg via INTRAVENOUS
  Filled 2022-06-27 (×2): qty 100

## 2022-06-27 MED ORDER — INSULIN ASPART 100 UNIT/ML IJ SOLN: 0.0000 [IU] | Freq: Three times a day (TID) | INTRAMUSCULAR | Status: DC

## 2022-06-27 MED ORDER — INSULIN GLARGINE-YFGN 100 UNIT/ML ~~LOC~~ SOLN
10.0000 [IU] | Freq: Every day | SUBCUTANEOUS | Status: DC
  Administered 2022-06-27: 10 [IU] via SUBCUTANEOUS
  Filled 2022-06-27: qty 0.1

## 2022-06-27 MED ORDER — POTASSIUM CHLORIDE CRYS ER 20 MEQ PO TBCR
40.0000 meq | EXTENDED_RELEASE_TABLET | Freq: Once | ORAL | Status: AC
  Administered 2022-06-27: 40 meq via ORAL
  Filled 2022-06-27: qty 2

## 2022-06-27 MED ORDER — INSULIN ASPART 100 UNIT/ML IJ SOLN: 3.0000 [IU] | Freq: Three times a day (TID) | INTRAMUSCULAR | Status: DC

## 2022-06-27 MED ORDER — INSULIN ASPART 100 UNIT/ML IJ SOLN
0.0000 [IU] | Freq: Every day | INTRAMUSCULAR | Status: DC
  Filled 2022-06-27: qty 1

## 2022-06-27 NOTE — Progress Notes (Addendum)
[facility] Progress Note  Patient: [name]    ZOX:[id number]  DOA: 06/23/2022     Date of Service: the patient was seen and examined on 06/27/2022  Chief Complaint  Patient presents with   Emesis   Brief hospital course: 82 year old female with PMH of Chronic Dementia, Hypertension, Diabetes, left breast cancer s/p 05/15/22 modified radical mastectomy who presented to the ED with right sided abdominal pain, nausea and vomiting as well as fatigue, malaise, and mild icterus.  RUQ Ultrasound showed cholelithiasis without evidence of cholecystitis.  CBD was 2 mm.   On 4/18, patient had a robotic cholecystectomy (Dr. Hazle Quant). Since then, LFTs have trended up.     06/24/22 06/25/22  06/26/22  06/27/22   AST 343 (H) 272 (H) 693 (H) 626 (H)  ALT 249 (H) 319 (H) 560 (H) 672 (H)  Total Protein 7.0 6.7 6.5 6.8  Bilirubin, Direct 0.9 (H) 0.3 (H) 1.0 (H) 1.3 (H)  Indirect Bilirubin 0.9 0.7 0.8 1.2 (H)  Total Bilirubin 1.8 (H) 1.0 1.8 (H) 2.5 (H)    06/24/22 06/25/22 06/26/22 06/27/22  Alkaline Phosphatase 151 (H) 188 (H) 341 (H) 372 (H)   On 4/20, MRCP showed no evidence of obstruction. Patient will be transferred [facility] for an ERCP.  Case was discussed with gastroenterologist Dr. Elnoria Howard.  Accepting physician was hospitalist Dr. Chipper Herb.  Assessment and Plan:  Conjugated Hyperbilirubinemia with Mild Jaundice - secondary to extra hepatic obstruction vs drug induced liver injury: Tbili is up to 2.5 and direct bili is up to 1.2 and patient has developed a mild icterus.  This pattern is suggestive of a hepatocellular injury or cholestasis due to an extra-hepatic obstruction, which would necessitate ERCP.  ALT/AST is 672/626 which is more specific to liver injury.  Alk Ph is up to 372 indicating biliary obstruction. - 4/20 MRCP reports "no definite choledocholithiasis or findings of biliary tract obstruction." - Given patient's continued abdominal pain, this may be a retained  biliary stone or possible obstruction/leakage vs other.  Patient will be transferred to [facility] for an ERCP. - Hold Letrozole as this can uncommonly cause hepatitis with autoimmune features. - General Surgery is following, appreciate assistance and recommendations. - If patient has any fevers, we will start antibiotics.  Diabetes Mellitus Type 1: Glucose is 195-242 mmol/L. - Increase Glargine from 8 to 10 units daily and start Lispro 3 units TID. - Continue Lispro SSI with POC glucose q4h.  Hypokalemia: - K 3.3 mmol/L, replaced.  Hypomagnesium: - Mg 1.7 mg/dL, replaced.   Breast cancer s/p 05/15/22 left unilateral modified radical mastectomy: - Hold Letrozole as this can uncommonly cause hepatitis with autoimmune features.  Chronic Dementia: - Mental status is at baseline.  Essential Hypertension: BP is 153/74 mmHg. - Hold Hydrochlorothiazide to avoid dehydration. - Continue Amlodipine 5 mg daily and Losartan 100 mg daily. - If blood pressure remains high, we will increase Amlodipine from 5 to 10 mg daily.  Hyperlipidemia: - Hold Statin due to elevated LFTs.  Osteoporosis Body mass index is 25.61 kg/m.   Diet: Soft/carb modified diet DVT Prophylaxis: Subcutaneous Lovenox  Dispo: Floors Discharge Plan: Pending GI workup.  Advance goals of care discussion: Full code  Family Communication: Daughter was present at bedside, at the time of interview.  The pt provided permission to discuss medical plan with the family. Opportunity was given to ask question and all questions were answered satisfactorily.   Subjective:  [name] is resting comfortably this morning.  She did sleep well last night. She has some abdominal discomfort on light palpation. She denies any fevers, chills, nausea, vomiting.  Physical Exam: Physical Exam Constitutional:      Appearance: She is ill-appearing.  HENT:     Head: Normocephalic and atraumatic.     Mouth/Throat:     Mouth: Mucous  membranes are dry.  Eyes:     Extraocular Movements: Extraocular movements intact.     Pupils: Pupils are equal, round, and reactive to light.  Cardiovascular:     Rate and Rhythm: Normal rate and regular rhythm.     Pulses: Normal pulses.     Heart sounds: Normal heart sounds.  Pulmonary:     Effort: Pulmonary effort is normal.     Breath sounds: Normal breath sounds.  Abdominal:     General: There is no distension.     Palpations: Abdomen is soft.     Tenderness: There is abdominal tenderness.  Musculoskeletal:     Cervical back: Normal range of motion and neck supple.  Skin:    General: Skin is warm and dry.     Capillary Refill: Capillary refill takes less than 2 seconds.     Coloration: Skin is jaundiced.  Neurological:     General: No focal deficit present.  Psychiatric:        Mood and Affect: Mood normal.    Vitals:   06/26/22 0820 06/26/22 1953 06/27/22 0410 06/27/22 0722  BP: (!) 149/52 (!) 156/62 (!) 156/68 (!) 153/74  Pulse: (!) 59 71 61 68  Resp: Temp: 98.1 F (36.7 C) 98.7 F (37.1 C) 98.4 F (36.9 C) 98.6 F (37 C)  TempSrc:   Oral Oral  SpO2: 98% 95% 98% 97%  Weight:        Intake/Output Summary (Last 24 hours) at 06/27/2022 1300 Last data filed at 06/27/2022 0212 Gross per 24 hour  Intake --  Output 750 ml  Net -750 ml    Filed Weights   06/23/22 1745  Weight: 63.5 kg    Data Reviewed: I have personally reviewed and interpreted daily labs, tele strips, imagings as discussed above. I reviewed all nursing notes, pharmacy notes, vitals, pertinent old records I have discussed plan of care as described above with RN and patient/family.  CBC: Recent Labs  Lab 06/23/22 1748 06/25/22 0505 06/26/22 0500 06/27/22 0546  WBC 9.6 5.4 7.1 8.1  HGB 12.1 11.9* 11.5* 12.0  HCT 36.5 35.0* 33.8* 35.2*  MCV 89.2 87.3 87.8 86.9  PLT 243 201 194 187    Basic Metabolic Panel: Recent Labs  Lab 06/23/22 1748 06/24/22 0133 06/25/22 0505  06/26/22 0500 06/27/22 0546  NA 136  --  139 138 138  K 3.7  --  3.3* 3.8 3.3*  CL 100  --  104 104 103  CO2 25  --  GLUCOSE 249*  --  234* 236* 195*  BUN 20  --  CREATININE 0.72  --  0.53 0.83 0.58  CALCIUM 9.4  --  8.9 9.0 9.4  MG  --  1.9 1.7 1.7 1.7  PHOS  --  4.6 3.0 3.4 2.8     Studies: MR ABDOMEN MRCP WO CONTRAST  Result Date: 06/27/2022 CLINICAL DATA:  82 year old female status post cholecystectomy presenting with history of jaundice. Suspected retained choledocholithiasis. EXAM: MRI ABDOMEN WITHOUT CONTRAST  (INCLUDING MRCP) TECHNIQUE: Multiplanar multisequence MR imaging of the abdomen was performed. Heavily  T2-weighted images of the biliary and pancreatic ducts were obtained, and three-dimensional MRCP images were rendered by post processing. COMPARISON:  Abdominal MRI with MRCP 06/24/2022. FINDINGS: Comment: Today's study is limited for detection of visceral and/or vascular lesions by lack of IV gadolinium. Considerable respiratory motion also limits portions of today's examination. Lower chest: Unremarkable. Hepatobiliary: No suspicious cystic or solid hepatic lesions are confidently identified on today's noncontrast examination. Status post cholecystectomy. Trace amount of T2 hyperintense fluid in the gallbladder fossa, within normal limits in this postoperative patient. This fluid does not appear to exert mass effect or have organized properties to strongly suggest abscess or definite biloma at this time. MRCP images are severely limited by motion artifact. With these limitations in mind, there is no substantial intrahepatic biliary ductal dilatation. Common bile duct is normal in caliber measuring 4 mm in the porta hepatis. Accurate assessment for choledocholithiasis is not possible on today's motion limited examination, but no definite filling defects are confidently identified in the common bile duct to clearly indicate the presence of retained ductal stones.  Pancreas: No pancreatic mass noted on today's limited noncontrast examination. No pancreatic ductal dilatation. No peripancreatic fluid collections or inflammatory changes. Spleen:  Unremarkable. Adrenals/Urinary Tract: T1 hypointense and T2 hyperintense lesions in the kidneys bilaterally, incompletely characterized on today's noncontrast CT examination, but statistically likely to represent cysts (no imaging follow-up recommended), largest of which measures 1.3 cm in the lateral aspect of the lower pole of the right kidney. No hydroureteronephrosis in the visualized portions of the abdomen. Bilateral adrenal glands are normal in appearance. Stomach/Bowel: Visualized portions are unremarkable. Vascular/Lymphatic: No aneurysm identified in the visualized abdominal vasculature. No definite lymphadenopathy noted in the abdomen. Other: No significant volume of ascites noted in the visualized portions of the peritoneal cavity. Musculoskeletal: Soft tissue edema in the flank regions bilaterally, similar to the prior study. No aggressive appearing osseous lesions are noted in the visualized portions of the skeleton. IMPRESSION: 1. Limited study secondary to patient respiratory motion and lack of IV contrast administration. With these limitations in mind, there is no definite choledocholithiasis or findings of biliary tract obstruction. Small postoperative fluid collection in the gallbladder fossa is within normal limits given the recent cholecystectomy. Electronically Signed   By: Trudie Reed M.D.   On: 06/27/2022 12:30   MR 3D Recon At Scanner  Result Date: 06/27/2022 CLINICAL DATA:  82 year old female status post cholecystectomy presenting with history of jaundice. Suspected retained choledocholithiasis. EXAM: MRI ABDOMEN WITHOUT CONTRAST  (INCLUDING MRCP) TECHNIQUE: Multiplanar multisequence MR imaging of the abdomen was performed. Heavily T2-weighted images of the biliary and pancreatic ducts were obtained,  and three-dimensional MRCP images were rendered by post processing. COMPARISON:  Abdominal MRI with MRCP 06/24/2022. FINDINGS: Comment: Today's study is limited for detection of visceral and/or vascular lesions by lack of IV gadolinium. Considerable respiratory motion also limits portions of today's examination. Lower chest: Unremarkable. Hepatobiliary: No suspicious cystic or solid hepatic lesions are confidently identified on today's noncontrast examination. Status post cholecystectomy. Trace amount of T2 hyperintense fluid in the gallbladder fossa, within normal limits in this postoperative patient. This fluid does not appear to exert mass effect or have organized properties to strongly suggest abscess or definite biloma at this time. MRCP images are severely limited by motion artifact. With these limitations in mind, there is no substantial intrahepatic biliary ductal dilatation. Common bile duct is normal in caliber measuring 4 mm in the porta hepatis. Accurate assessment for choledocholithiasis is not possible on  today's motion limited examination, but no definite filling defects are confidently identified in the common bile duct to clearly indicate the presence of retained ductal stones. Pancreas: No pancreatic mass noted on today's limited noncontrast examination. No pancreatic ductal dilatation. No peripancreatic fluid collections or inflammatory changes. Spleen:  Unremarkable. Adrenals/Urinary Tract: T1 hypointense and T2 hyperintense lesions in the kidneys bilaterally, incompletely characterized on today's noncontrast CT examination, but statistically likely to represent cysts (no imaging follow-up recommended), largest of which measures 1.3 cm in the lateral aspect of the lower pole of the right kidney. No hydroureteronephrosis in the visualized portions of the abdomen. Bilateral adrenal glands are normal in appearance. Stomach/Bowel: Visualized portions are unremarkable. Vascular/Lymphatic: No aneurysm  identified in the visualized abdominal vasculature. No definite lymphadenopathy noted in the abdomen. Other: No significant volume of ascites noted in the visualized portions of the peritoneal cavity. Musculoskeletal: Soft tissue edema in the flank regions bilaterally, similar to the prior study. No aggressive appearing osseous lesions are noted in the visualized portions of the skeleton. IMPRESSION: 1. Limited study secondary to patient respiratory motion and lack of IV contrast administration. With these limitations in mind, there is no definite choledocholithiasis or findings of biliary tract obstruction. Small postoperative fluid collection in the gallbladder fossa is within normal limits given the recent cholecystectomy. Electronically Signed   By: Trudie Reed M.D.   On: 06/27/2022 12:30    Scheduled Meds:  amLODipine  5 mg Oral Daily   calcium-vitamin D  1 tablet Oral BID   donepezil  10 mg Oral QHS   enoxaparin (LOVENOX) injection  40 mg Subcutaneous QPM   losartan  100 mg Oral Daily   And   hydrochlorothiazide  12.5 mg Oral Daily   insulin aspart  0-9 Units Subcutaneous Q4H   insulin glargine-yfgn  10 Units Subcutaneous Daily   letrozole  2.5 mg Oral Daily   potassium chloride  40 mEq Oral Once   Continuous Infusions:   PRN Meds: acetaminophen **OR** acetaminophen, hydrALAZINE, morphine injection, ondansetron **OR** ondansetron (ZOFRAN) IV  Time spent: 60 minutes  Author: Baldwin Jamaica, MD [facility] 06/27/2022 1:00 PM  To reach On-call, see care teams to locate the attending and reach out to them via [email or web address]. If 7PM-7AM, please contact night-coverage If you still have difficulty reaching the attending provider, please page the [facility] (Director on Call) for [facility] on amion for assistance.

## 2022-06-27 NOTE — Progress Notes (Deleted)
Patient transported for MRCP in bed in stab;e condition.

## 2022-06-27 NOTE — Discharge Summary (Addendum)
TRANSFER SUMMARY  PATIENT IS BEING DISCHARGED TO [facility] FOR AN ERCP AND GI EVALUATION.   Patient: [name] MRN: [id number] DOB: 1940/12/12  Admit date:     06/23/2022  Discharge date: 06/27/22  Discharge Physician: Baldwin Jamaica   PCP: Alba Cory, MD    Discharge Diagnoses: Principal Problem:   Biliary colic Active Problems:   Cholelithiasis   Abnormal LFTs   Type 1 diabetes mellitus with hyperglycemia, with long-term current use of insulin   Hypertension associated with diabetes (HCC)   Dementia   Breast cancer s/p  unilateral modified radical mastectomy, left 05/2022   Choledocholithiasis  Resolved Problems:   * No resolved hospital problems. *  82 year old female with PMH of Chronic Dementia, Hypertension, Diabetes, left breast cancer s/p 05/15/22 modified radical mastectomy who presented to the ED with right sided abdominal pain, nausea and vomiting as well as fatigue, malaise, and mild icterus.   RUQ Ultrasound showed cholelithiasis without evidence of cholecystitis.  CBD was 2 mm.   On 4/18, patient had a robotic cholecystectomy (Dr. Hazle Quant). Since then, LFTs have trended up.       06/24/22 06/25/22  06/26/22  06/27/22   AST 343 (H) 272 (H) 693 (H) 626 (H)  ALT 249 (H) 319 (H) 560 (H) 672 (H)  Total Protein 7.0 6.7 6.5 6.8  Bilirubin, Direct 0.9 (H) 0.3 (H) 1.0 (H) 1.3 (H)  Indirect Bilirubin 0.9 0.7 0.8 1.2 (H)  Total Bilirubin 1.8 (H) 1.0 1.8 (H) 2.5 (H)      06/24/22 06/25/22 06/26/22 06/27/22  Alkaline Phosphatase 151 (H) 188 (H) 341 (H) 372 (H)    On 4/20, MRCP showed no evidence of obstruction. Patient will be transferred to [facility] for an ERCP.  Case was discussed with gastroenterologist Dr. Elnoria Howard.  Accepting physician was hospitalist Dr. Chipper Herb.   Assessment and Plan:   Conjugated Hyperbilirubinemia with Mild Jaundice - secondary to extra hepatic obstruction vs drug induced liver injury: Tbili is up to 2.5 and direct bili is up  to 1.2 and patient has developed a mild icterus.  This pattern is suggestive of a hepatocellular injury or cholestasis due to an extra-hepatic obstruction, which would necessitate ERCP.  ALT/AST is 672/626 which is more specific to liver injury.  Alk Ph is up to 372 indicating biliary obstruction. - 4/20 MRCP reports "no definite choledocholithiasis or findings of biliary tract obstruction."  General Surgery reviewed imaging and states "I do see a feeling defect on the MRCP on Series #17 on image 8/17.: - Given patient's continued abdominal pain, this may be a retained biliary stone or possible obstruction/leakage vs other.  Patient will be transferred to [facility] for an ERCP. - Hold Letrozole as this can uncommonly cause hepatitis with autoimmune features. - General Surgery is following, appreciate assistance and recommendations. - If patient has any fevers, we will start antibiotics.   Diabetes Mellitus Type 1: Glucose is 195-242 mmol/L. - Increase Glargine from 8 to 10 units daily and start Lispro 3 units TID. - Continue Lispro SSI with POC glucose q4h.   Hypokalemia: - K 3.3 mmol/L, replaced.   Hypomagnesium: - Mg 1.7 mg/dL, replaced.   Breast cancer s/p 05/15/22 left unilateral modified radical mastectomy: - Hold Letrozole as this can uncommonly cause hepatitis with autoimmune features.   Chronic Dementia: - Mental status is at baseline.   Essential Hypertension: BP is 153/74 mmHg. - Hold Hydrochlorothiazide to avoid dehydration. - Continue Amlodipine 5 mg daily and Losartan  100 mg daily. - If blood pressure remains high, we will increase Amlodipine from 5 to 10 mg daily.   Hyperlipidemia: - Hold Statin due to elevated LFTs.   Osteoporosis Body mass index is 25.61 kg/m.        Consultants: Case was discussed with Gastroenterology Dr. Elnoria Howard Procedures performed:   4/18 Cholecystectomy (Dr. Hazle Quant)   Preoperative diagnosis: Cholelithiasis with  choledocholithiasis   Postoperative diagnosis: Same   Procedure: Robotic Assisted Laparoscopic Cholecystectomy.    Anesthesia: GETA   Surgeon: Dr. Hazle Quant   Wound Classification: Clean Contaminated   Indications: Patient is a 82 y.o. female developed vomiting.  She went to ED for evaluation.  She was found to wait cholelithiasis to wait elevated bilirubin.  MRCP negative for choledocholithiasis.  Follow-up lipase normalized.  Cholecystectomy indicated for prevention of recurrent episode of passing stones in the common bile duct.      Disposition: [facility] Diet recommendation:  Discharge Diet Orders (From admission, onward)     Start     Ordered   06/27/22 0000  Diet - low sodium heart healthy        06/27/22 1819           Cardiac diet   Current Facility-Administered Medications (Endocrine & Metabolic):    insulin aspart (novoLOG) injection 0-5 Units   insulin aspart (novoLOG) injection 0-9 Units   insulin aspart (novoLOG) injection 3 Units   insulin glargine-yfgn (SEMGLEE) injection 10 Units   Current Facility-Administered Medications (Cardiovascular):    amLODipine (NORVASC) tablet 5 mg   hydrALAZINE (APRESOLINE) tablet 50 mg   losartan (COZAAR) tablet 100 mg **AND** [DISCONTINUED] hydrochlorothiazide (HYDRODIURIL) tablet 12.5 mg     Current Facility-Administered Medications (Analgesics):    acetaminophen (TYLENOL) tablet 650 mg **OR** acetaminophen (TYLENOL) suppository 650 mg   morphine (PF) 2 MG/ML injection 2 mg   Current Facility-Administered Medications (Hematological):    enoxaparin (LOVENOX) injection 40 mg   Current Facility-Administered Medications (Other):    calcium-vitamin D (OSCAL WITH D) 500-5 MG-MCG per tablet 1 tablet   ciprofloxacin (CIPRO) IVPB 400 mg   donepezil (ARICEPT) tablet 10 mg   metroNIDAZOLE (FLAGYL) IVPB 500 mg   ondansetron (ZOFRAN) tablet 4 mg **OR** ondansetron (ZOFRAN) injection 4 mg  No current  outpatient medications on file.    Discharge Exam: Filed Weights   06/23/22 1745  Weight: 63.5 kg   Physical Exam Constitutional:      Appearance: She is ill-appearing.  HENT:     Head: Normocephalic and atraumatic.     Mouth/Throat:     Mouth: Mucous membranes are dry.  Eyes:     Extraocular Movements: Extraocular movements intact.     Pupils: Pupils are equal, round, and reactive to light.  Cardiovascular:     Rate and Rhythm: Normal rate and regular rhythm.     Pulses: Normal pulses.     Heart sounds: Normal heart sounds.  Pulmonary:     Effort: Pulmonary effort is normal.     Breath sounds: Normal breath sounds.  Abdominal:     General: There is no distension.     Palpations: Abdomen is soft.     Tenderness: There is abdominal tenderness.  Musculoskeletal:     Cervical back: Normal range of motion and neck supple.  Skin:    General: Skin is warm and dry.     Capillary Refill: Capillary refill takes less than 2 seconds.     Coloration: Skin is jaundiced.  Neurological:  General: No focal deficit present.  Psychiatric:        Mood and Affect: Mood normal.   Condition at discharge: stable  The results of significant diagnostics from this hospitalization (including imaging, microbiology, ancillary and laboratory) are listed below for reference.   Imaging Studies: MR ABDOMEN MRCP WO CONTRAST  Result Date: 06/27/2022 CLINICAL DATA:  82 year old female status post cholecystectomy presenting with history of jaundice. Suspected retained choledocholithiasis. EXAM: MRI ABDOMEN WITHOUT CONTRAST  (INCLUDING MRCP) TECHNIQUE: Multiplanar multisequence MR imaging of the abdomen was performed. Heavily T2-weighted images of the biliary and pancreatic ducts were obtained, and three-dimensional MRCP images were rendered by post processing. COMPARISON:  Abdominal MRI with MRCP 06/24/2022. FINDINGS: Comment: Today's study is limited for detection of visceral and/or vascular lesions by  lack of IV gadolinium. Considerable respiratory motion also limits portions of today's examination. Lower chest: Unremarkable. Hepatobiliary: No suspicious cystic or solid hepatic lesions are confidently identified on today's noncontrast examination. Status post cholecystectomy. Trace amount of T2 hyperintense fluid in the gallbladder fossa, within normal limits in this postoperative patient. This fluid does not appear to exert mass effect or have organized properties to strongly suggest abscess or definite biloma at this time. MRCP images are severely limited by motion artifact. With these limitations in mind, there is no substantial intrahepatic biliary ductal dilatation. Common bile duct is normal in caliber measuring 4 mm in the porta hepatis. Accurate assessment for choledocholithiasis is not possible on today's motion limited examination, but no definite filling defects are confidently identified in the common bile duct to clearly indicate the presence of retained ductal stones. Pancreas: No pancreatic mass noted on today's limited noncontrast examination. No pancreatic ductal dilatation. No peripancreatic fluid collections or inflammatory changes. Spleen:  Unremarkable. Adrenals/Urinary Tract: T1 hypointense and T2 hyperintense lesions in the kidneys bilaterally, incompletely characterized on today's noncontrast CT examination, but statistically likely to represent cysts (no imaging follow-up recommended), largest of which measures 1.3 cm in the lateral aspect of the lower pole of the right kidney. No hydroureteronephrosis in the visualized portions of the abdomen. Bilateral adrenal glands are normal in appearance. Stomach/Bowel: Visualized portions are unremarkable. Vascular/Lymphatic: No aneurysm identified in the visualized abdominal vasculature. No definite lymphadenopathy noted in the abdomen. Other: No significant volume of ascites noted in the visualized portions of the peritoneal cavity.  Musculoskeletal: Soft tissue edema in the flank regions bilaterally, similar to the prior study. No aggressive appearing osseous lesions are noted in the visualized portions of the skeleton. IMPRESSION: 1. Limited study secondary to patient respiratory motion and lack of IV contrast administration. With these limitations in mind, there is no definite choledocholithiasis or findings of biliary tract obstruction. Small postoperative fluid collection in the gallbladder fossa is within normal limits given the recent cholecystectomy. Electronically Signed   By: Trudie Reed M.D.   On: 06/27/2022 12:30   MR 3D Recon At Scanner  Result Date: 06/27/2022 CLINICAL DATA:  82 year old female status post cholecystectomy presenting with history of jaundice. Suspected retained choledocholithiasis. EXAM: MRI ABDOMEN WITHOUT CONTRAST  (INCLUDING MRCP) TECHNIQUE: Multiplanar multisequence MR imaging of the abdomen was performed. Heavily T2-weighted images of the biliary and pancreatic ducts were obtained, and three-dimensional MRCP images were rendered by post processing. COMPARISON:  Abdominal MRI with MRCP 06/24/2022. FINDINGS: Comment: Today's study is limited for detection of visceral and/or vascular lesions by lack of IV gadolinium. Considerable respiratory motion also limits portions of today's examination. Lower chest: Unremarkable. Hepatobiliary: No suspicious cystic or solid hepatic lesions  are confidently identified on today's noncontrast examination. Status post cholecystectomy. Trace amount of T2 hyperintense fluid in the gallbladder fossa, within normal limits in this postoperative patient. This fluid does not appear to exert mass effect or have organized properties to strongly suggest abscess or definite biloma at this time. MRCP images are severely limited by motion artifact. With these limitations in mind, there is no substantial intrahepatic biliary ductal dilatation. Common bile duct is normal in caliber  measuring 4 mm in the porta hepatis. Accurate assessment for choledocholithiasis is not possible on today's motion limited examination, but no definite filling defects are confidently identified in the common bile duct to clearly indicate the presence of retained ductal stones. Pancreas: No pancreatic mass noted on today's limited noncontrast examination. No pancreatic ductal dilatation. No peripancreatic fluid collections or inflammatory changes. Spleen:  Unremarkable. Adrenals/Urinary Tract: T1 hypointense and T2 hyperintense lesions in the kidneys bilaterally, incompletely characterized on today's noncontrast CT examination, but statistically likely to represent cysts (no imaging follow-up recommended), largest of which measures 1.3 cm in the lateral aspect of the lower pole of the right kidney. No hydroureteronephrosis in the visualized portions of the abdomen. Bilateral adrenal glands are normal in appearance. Stomach/Bowel: Visualized portions are unremarkable. Vascular/Lymphatic: No aneurysm identified in the visualized abdominal vasculature. No definite lymphadenopathy noted in the abdomen. Other: No significant volume of ascites noted in the visualized portions of the peritoneal cavity. Musculoskeletal: Soft tissue edema in the flank regions bilaterally, similar to the prior study. No aggressive appearing osseous lesions are noted in the visualized portions of the skeleton. IMPRESSION: 1. Limited study secondary to patient respiratory motion and lack of IV contrast administration. With these limitations in mind, there is no definite choledocholithiasis or findings of biliary tract obstruction. Small postoperative fluid collection in the gallbladder fossa is within normal limits given the recent cholecystectomy. Electronically Signed   By: Trudie Reed M.D.   On: 06/27/2022 12:30   MR ABDOMEN MRCP WO CONTRAST  Result Date: 06/24/2022 CLINICAL DATA:  Jaundice and cholelithiasis. History of diabetes  and breast cancer. EXAM: MRI ABDOMEN WITHOUT CONTRAST  (INCLUDING MRCP) TECHNIQUE: Multiplanar multisequence MR imaging of the abdomen was performed. Heavily T2-weighted images of the biliary and pancreatic ducts were obtained, and three-dimensional MRCP images were rendered by post processing. COMPARISON:  Abdominal ultrasound 06/23/2022. PET-CT 04/28/2022. Abdominopelvic CT 01/24/2012. FINDINGS: Despite efforts by the technologist and patient, moderate to severe motion artifact is present on today's exam and could not be eliminated. This reduces exam sensitivity and specificity. Lower chest: No significant findings are seen within the visualized lower chest status post left mastectomy. Hepatobiliary: No focal hepatic abnormalities are identified. As demonstrated on PET-CT, there are innumerable calcified gallstones throughout the gallbladder. No evidence of gallbladder wall thickening or surrounding inflammation. The common hepatic duct measures 6 mm in diameter and there is no intrahepatic biliary dilatation. No definite intraductal calculi are demonstrated, although evaluation is limited by the motion. Pancreas: Unremarkable. No pancreatic ductal dilatation or surrounding inflammatory changes. Spleen: Normal in size without focal abnormality. Adrenals/Urinary Tract: Both adrenal glands appear normal. Small bilateral renal cysts for which no follow-up imaging is recommended. No hydronephrosis. Stomach/Bowel: The stomach appears unremarkable for its degree of distension. No evidence of bowel wall thickening, distention or surrounding inflammatory change. Vascular/Lymphatic: There are no enlarged abdominal lymph nodes. Aortic and branch vessel atherosclerosis without evidence of aneurysm. Other: Bilateral flank edema, asymmetric to the left. No ascites or focal extraluminal fluid collection. Uterine fibroids are partially  imaged. Musculoskeletal: No acute or significant osseous findings. IMPRESSION: 1.  Cholelithiasis without evidence of cholecystitis. 2. No evidence of biliary dilatation or definite choledocholithiasis, although evaluation is limited by motion. 3. No acute abdominal findings. 4. Bilateral flank edema, asymmetric to the left. Electronically Signed   By: Carey Bullocks M.D.   On: 06/24/2022 14:54   MR 3D Recon At Scanner  Result Date: 06/24/2022 CLINICAL DATA:  Jaundice and cholelithiasis. History of diabetes and breast cancer. EXAM: MRI ABDOMEN WITHOUT CONTRAST  (INCLUDING MRCP) TECHNIQUE: Multiplanar multisequence MR imaging of the abdomen was performed. Heavily T2-weighted images of the biliary and pancreatic ducts were obtained, and three-dimensional MRCP images were rendered by post processing. COMPARISON:  Abdominal ultrasound 06/23/2022. PET-CT 04/28/2022. Abdominopelvic CT 01/24/2012. FINDINGS: Despite efforts by the technologist and patient, moderate to severe motion artifact is present on today's exam and could not be eliminated. This reduces exam sensitivity and specificity. Lower chest: No significant findings are seen within the visualized lower chest status post left mastectomy. Hepatobiliary: No focal hepatic abnormalities are identified. As demonstrated on PET-CT, there are innumerable calcified gallstones throughout the gallbladder. No evidence of gallbladder wall thickening or surrounding inflammation. The common hepatic duct measures 6 mm in diameter and there is no intrahepatic biliary dilatation. No definite intraductal calculi are demonstrated, although evaluation is limited by the motion. Pancreas: Unremarkable. No pancreatic ductal dilatation or surrounding inflammatory changes. Spleen: Normal in size without focal abnormality. Adrenals/Urinary Tract: Both adrenal glands appear normal. Small bilateral renal cysts for which no follow-up imaging is recommended. No hydronephrosis. Stomach/Bowel: The stomach appears unremarkable for its degree of distension. No evidence of  bowel wall thickening, distention or surrounding inflammatory change. Vascular/Lymphatic: There are no enlarged abdominal lymph nodes. Aortic and branch vessel atherosclerosis without evidence of aneurysm. Other: Bilateral flank edema, asymmetric to the left. No ascites or focal extraluminal fluid collection. Uterine fibroids are partially imaged. Musculoskeletal: No acute or significant osseous findings. IMPRESSION: 1. Cholelithiasis without evidence of cholecystitis. 2. No evidence of biliary dilatation or definite choledocholithiasis, although evaluation is limited by motion. 3. No acute abdominal findings. 4. Bilateral flank edema, asymmetric to the left. Electronically Signed   By: Carey Bullocks M.D.   On: 06/24/2022 14:54   US Abdomen Limited RUQ (LIVER/GB)  Result Date: 06/23/2022 CLINICAL DATA:  Right upper quadrant abdominal pain EXAM: ULTRASOUND ABDOMEN LIMITED RIGHT UPPER QUADRANT COMPARISON:  PET CT 04/28/2022 FINDINGS: Gallbladder: The gallbladder is stone filled resulting in a "wall echo shadow" complex. No superimposed gallbladder wall thickening or pericholecystic fluid is identified. The sonographic Eulah Pont sign is reportedly negative. Common bile duct: Diameter: 2 mm in proximal diameter Liver: No focal lesion identified. Within normal limits in parenchymal echogenicity. Portal vein is patent on color Doppler imaging with normal direction of blood flow towards the liver. Other: None. IMPRESSION: 1. Cholelithiasis without sonographic evidence of acute cholecystitis. Electronically Signed   By: Helyn Numbers M.D.   On: 06/23/2022 21:59    Microbiology: Results for orders placed or performed in visit on 05/15/19  Urine Culture     Status: None   Collection Time: 05/15/19  2:08 PM  Result Value Ref Range Status   MICRO NUMBER: [id number]  Final   SPECIMEN QUALITY: Adequate  Final   Sample Source URINE  Final   STATUS: FINAL  Final   ISOLATE 1:   Final    Growth of mixed flora was  isolated, suggesting probable contamination. No further testing will be performed. If clinically indicated,  recollection using a method to minimize contamination, with prompt transfer to Urine Culture Transport Tube, is  recommended.     Labs: CBC: Recent Labs  Lab 06/23/22 1748 06/25/22 0505 06/26/22 0500 06/27/22 0546  WBC 9.6 5.4 7.1 8.1  HGB 12.1 11.9* 11.5* 12.0  HCT 36.5 35.0* 33.8* 35.2*  MCV 89.2 87.3 87.8 86.9  PLT 243 201 194 187   Basic Metabolic Panel: Recent Labs  Lab 06/23/22 1748 06/24/22 0133 06/25/22 0505 06/26/22 0500 06/27/22 0546  NA 136  --  139 138 138  K 3.7  --  3.3* 3.8 3.3*  CL 100  --  104 104 103  CO2 25  --  GLUCOSE 249*  --  234* 236* 195*  BUN 20  --  CREATININE 0.72  --  0.53 0.83 0.58  CALCIUM 9.4  --  8.9 9.0 9.4  MG  --  1.9 1.7 1.7 1.7  PHOS  --  4.6 3.0 3.4 2.8   Liver Function Tests: Recent Labs  Lab 06/23/22 1748 06/24/22 0133 06/25/22 0505 06/26/22 0500 06/27/22 0546  AST 238*  232* 343* 272* 693* 626*  ALT 126*  123* 249* 319* 560* 672*  ALKPHOS 120  122 151* 188* 341* 372*  BILITOT 1.4*  1.5* 1.8* 1.0 1.8* 2.5*  PROT 7.2  7.1 7.0 6.7 6.5 6.8  ALBUMIN 3.6  3.5 3.6 3.1* 3.1* 3.2*   CBG: Recent Labs  Lab 06/27/22 0412 06/27/22 0724 06/27/22 1125 06/27/22 1518 06/27/22 1732  GLUCAP 185* 202* 242* 190* 260*    Discharge time spent: greater than 60 minutes.  Signed: Baldwin Jamaica, MD [facility] 06/27/2022

## 2022-06-27 NOTE — Progress Notes (Signed)
Patient initially came to [facility] for cholecystitis and gallstone, with elevated LFTs mild elevation of total bilirubin.  Initial MRCP did not pick up any obstructions.  Patient underwent cholecystectomy however after surgery patient's LFTs continue to trend up and repeat MRI today is nonconclusive due to severe motion artifact.  GI Dr. Elnoria Howard at [facility] consulted and recommend patient transferred to [facility] for ERCP. vital signs stable afebrile, none hypotensive.  Hospitalist at [facility] recommend restart antibiotics as there is increasing concern about patient going to cholangitis with trending up LFTs.  Accepted to MedSurg at [facility], inform GI upon patient's arrival.

## 2022-06-27 NOTE — Progress Notes (Signed)
Patient transferred to [facility] on [address] room 111 via [facility] [place], Report called and given to Alden Server, Charity fundraiser.

## 2022-06-27 NOTE — Progress Notes (Signed)
Patient ID: [name], female   DOB: Mar 22, 1940, 82 y.o.   MRN: [id number]  I followed MRCP. Due to motion artifact unable to completely confirm that there is no stone on the common bile duct. I do see a feeling defect on the MRCP on Series #17 on image 8/17. This was not reproducible on other series. With total and direct bilirubin uptrend I think that patient will benefit of ERCP vs endoscopic ultrasound to diagnose and treat retained stone after cholecsytectomy. Case discussed with GI specialist Dr. Elnoria Howard and Hospitalist Dr. Chipper Herb accepted patient for transfer due to unavailability of ERCP in this institution.   Patient's daughter notified about recommendations and agreed. With plan.

## 2022-06-27 NOTE — Progress Notes (Signed)
Telephone call to [facility] [phone] to verify if the pt was on their list to transport to [facility] in [place], [place];  the pt is on their list and they are waiting on a bed; they will call when a bed is available

## 2022-06-27 NOTE — Progress Notes (Signed)
Patient returned from MRCP in bed in stable condition.  Daughter present in room.

## 2022-06-27 NOTE — Progress Notes (Signed)
Patient ID: [name], female   DOB: 01/19/1941, 83 y.o.   MRN: [id number]     SURGICAL PROGRESS NOTE   Hospital Day(s): 3.   Interval History: Patient seen and examined, no acute events or new complaints overnight.  As per daughter, patient without any issues yesterday.  She was able to tolerate soft diet.  No complaints.  Vital signs in last 24 hours: [min-max] current  Temp:  [98.4 F (36.9 C)-98.7 F (37.1 C)] 98.6 F (37 C) (04/20 0722) Pulse Rate:  [61-71] 68 (04/20 0722) Resp:  [12-16] 16 (04/20 0722) BP: (153-156)/(62-74) 153/74 (04/20 0722) SpO2:  [95 %-98 %] 97 % (04/20 0722)       Weight: 63.5 kg     Physical Exam:  Constitutional: alert, cooperative and no distress  Respiratory: breathing non-labored at rest  Cardiovascular: regular rate and sinus rhythm  Gastrointestinal: soft, non-tender, and non-distended  Labs:     Latest Ref Rng & Units 06/27/2022    5:46 AM 06/26/2022    5:00 AM 06/25/2022    5:05 AM  CBC  WBC 4.0 - 10.5 K/uL 8.1  7.1  5.4   Hemoglobin 12.0 - 15.0 g/dL 52.8  41.3  24.4   Hematocrit 36.0 - 46.0 % 35.2  33.8  35.0   Platelets 150 - 400 K/uL 187  194  201       Latest Ref Rng & Units 06/27/2022    5:46 AM 06/26/2022    5:00 AM 06/25/2022    5:05 AM  CMP  Glucose 70 - 99 mg/dL 010  272  536   BUN 8 - 23 mg/dL Creatinine 0.44 - 1.00 mg/dL 6.44  0.34  7.42   Sodium 135 - 145 mmol/L 138  138  139   Potassium 3.5 - 5.1 mmol/L 3.3  3.8  3.3   Chloride 98 - 111 mmol/L 103  104  104   CO2 22 - 32 mmol/L Calcium 8.9 - 10.3 mg/dL 9.4  9.0  8.9   Total Protein 6.5 - 8.1 g/dL 6.8  6.5  6.7   Total Bilirubin 0.3 - 1.2 mg/dL 2.5  1.8  1.0   Alkaline Phos 38 - 126 U/L 372  341  188   AST 15 - 41 U/L 626  693  272   ALT 0 - 44 U/L 672  560  319     Imaging studies: No new pertinent imaging studies   Assessment/Plan:  82 y.o. female with choledocholithiasis 2 Day Post-Op s/p robotic cholecystectomy, complicated by  pertinent comorbidities including Alzheimer's dementia, breast cancer.   -Today with uptrending total and direct bilirubin. -Seems to have GI specialist to wait ERCP capabilities, I will repeat MRCP.  She may need to be transfer to institution with ERCP abilities over the weekend. -Otherwise the patient is currently stable, no indication for any emergent procedure. -Continue pain management. -I will follow-up MRCP result for further recommendations.  Gae Gallop, MD

## 2022-06-27 NOTE — Progress Notes (Incomplete)
Patient transferred to [facility] on [facility] [address] room 111 via [facility], Report called and given to [place], Charity fundraiser.

## 2022-06-27 NOTE — Progress Notes (Signed)
Patient transported for MRCP in bed in stable condition.

## 2022-06-28 ENCOUNTER — Encounter (HOSPITAL_COMMUNITY): Payer: Self-pay | Admitting: Family Medicine

## 2022-06-28 ENCOUNTER — Inpatient Hospital Stay (HOSPITAL_COMMUNITY)
Admission: AD | Admit: 2022-06-28 | Discharge: 2022-07-01 | DRG: 443 | Disposition: A | Discharge status: Home Health | Payer: Medicare HMO | Source: Other Acute Inpatient Hospital | Attending: Family Medicine | Admitting: Family Medicine

## 2022-06-28 ENCOUNTER — Other Ambulatory Visit: Payer: Self-pay

## 2022-06-28 DIAGNOSIS — Z8249 Family history of ischemic heart disease and other diseases of the circulatory system: Secondary | ICD-10-CM | POA: Diagnosis not present

## 2022-06-28 DIAGNOSIS — R7989 Other specified abnormal findings of blood chemistry: Secondary | ICD-10-CM | POA: Diagnosis not present

## 2022-06-28 DIAGNOSIS — Z170 Estrogen receptor positive status [ER+]: Secondary | ICD-10-CM | POA: Diagnosis not present

## 2022-06-28 DIAGNOSIS — E1159 Type 2 diabetes mellitus with other circulatory complications: Secondary | ICD-10-CM | POA: Diagnosis not present

## 2022-06-28 DIAGNOSIS — Z888 Allergy status to other drugs, medicaments and biological substances status: Secondary | ICD-10-CM | POA: Diagnosis not present

## 2022-06-28 DIAGNOSIS — E1169 Type 2 diabetes mellitus with other specified complication: Secondary | ICD-10-CM | POA: Diagnosis present

## 2022-06-28 DIAGNOSIS — Z79899 Other long term (current) drug therapy: Secondary | ICD-10-CM

## 2022-06-28 DIAGNOSIS — Z833 Family history of diabetes mellitus: Secondary | ICD-10-CM

## 2022-06-28 DIAGNOSIS — N1831 Chronic kidney disease, stage 3a: Secondary | ICD-10-CM | POA: Diagnosis present

## 2022-06-28 DIAGNOSIS — R7401 Elevation of levels of liver transaminase levels: Secondary | ICD-10-CM | POA: Diagnosis not present

## 2022-06-28 DIAGNOSIS — I152 Hypertension secondary to endocrine disorders: Secondary | ICD-10-CM | POA: Diagnosis present

## 2022-06-28 DIAGNOSIS — I129 Hypertensive chronic kidney disease with stage 1 through stage 4 chronic kidney disease, or unspecified chronic kidney disease: Secondary | ICD-10-CM | POA: Diagnosis not present

## 2022-06-28 DIAGNOSIS — K759 Inflammatory liver disease, unspecified: Principal | ICD-10-CM | POA: Diagnosis present

## 2022-06-28 DIAGNOSIS — K5900 Constipation, unspecified: Secondary | ICD-10-CM | POA: Insufficient documentation

## 2022-06-28 DIAGNOSIS — Z794 Long term (current) use of insulin: Secondary | ICD-10-CM | POA: Diagnosis not present

## 2022-06-28 DIAGNOSIS — E876 Hypokalemia: Secondary | ICD-10-CM | POA: Diagnosis present

## 2022-06-28 DIAGNOSIS — Z880 Allergy status to penicillin: Secondary | ICD-10-CM

## 2022-06-28 DIAGNOSIS — E441 Mild protein-calorie malnutrition: Secondary | ICD-10-CM | POA: Diagnosis present

## 2022-06-28 DIAGNOSIS — F0390 Unspecified dementia without behavioral disturbance: Secondary | ICD-10-CM | POA: Diagnosis present

## 2022-06-28 DIAGNOSIS — Z9012 Acquired absence of left breast and nipple: Secondary | ICD-10-CM

## 2022-06-28 DIAGNOSIS — Z8261 Family history of arthritis: Secondary | ICD-10-CM

## 2022-06-28 DIAGNOSIS — R17 Unspecified jaundice: Secondary | ICD-10-CM | POA: Diagnosis present

## 2022-06-28 DIAGNOSIS — E785 Hyperlipidemia, unspecified: Secondary | ICD-10-CM | POA: Diagnosis present

## 2022-06-28 DIAGNOSIS — E1122 Type 2 diabetes mellitus with diabetic chronic kidney disease: Secondary | ICD-10-CM | POA: Diagnosis not present

## 2022-06-28 DIAGNOSIS — E1165 Type 2 diabetes mellitus with hyperglycemia: Secondary | ICD-10-CM | POA: Diagnosis present

## 2022-06-28 DIAGNOSIS — F068 Other specified mental disorders due to known physiological condition: Secondary | ICD-10-CM | POA: Diagnosis not present

## 2022-06-28 DIAGNOSIS — I700 Atherosclerosis of aorta: Secondary | ICD-10-CM | POA: Diagnosis not present

## 2022-06-28 DIAGNOSIS — M1990 Unspecified osteoarthritis, unspecified site: Secondary | ICD-10-CM | POA: Diagnosis present

## 2022-06-28 DIAGNOSIS — Z7983 Long term (current) use of bisphosphonates: Secondary | ICD-10-CM | POA: Diagnosis not present

## 2022-06-28 DIAGNOSIS — C50012 Malignant neoplasm of nipple and areola, left female breast: Secondary | ICD-10-CM

## 2022-06-28 DIAGNOSIS — K8050 Calculus of bile duct without cholangitis or cholecystitis without obstruction: Secondary | ICD-10-CM | POA: Diagnosis not present

## 2022-06-28 DIAGNOSIS — C50919 Malignant neoplasm of unspecified site of unspecified female breast: Secondary | ICD-10-CM | POA: Diagnosis present

## 2022-06-28 LAB — COMPREHENSIVE METABOLIC PANEL
ALT: 544 U/L — ABNORMAL HIGH (ref 0–44)
AST: 364 U/L — ABNORMAL HIGH (ref 15–41)
Albumin: 2.9 g/dL — ABNORMAL LOW (ref 3.5–5.0)
Alkaline Phosphatase: 356 U/L — ABNORMAL HIGH (ref 38–126)
Anion gap: 9 (ref 5–15)
BUN: 17 mg/dL (ref 8–23)
CO2: 22 mmol/L (ref 22–32)
Calcium: 8.9 mg/dL (ref 8.9–10.3)
Chloride: 101 mmol/L (ref 98–111)
Creatinine, Ser: 0.75 mg/dL (ref 0.44–1.00)
GFR, Estimated: >60 mL/min (ref >60)
Glucose, Bld: 325 mg/dL — ABNORMAL HIGH (ref 70–99)
Potassium: 3.7 mmol/L (ref 3.5–5.1)
Sodium: 132 mmol/L — ABNORMAL LOW (ref 135–145)
Total Bilirubin: 2.6 mg/dL — ABNORMAL HIGH (ref 0.3–1.2)
Total Protein: 6.3 g/dL — ABNORMAL LOW (ref 6.5–8.1)

## 2022-06-28 LAB — CBC WITH DIFFERENTIAL/PLATELET
Abs Immature Granulocytes: 0.06 10*3/uL (ref 0.00–0.07)
Basophils Absolute: 0 10*3/uL (ref 0.0–0.1)
Basophils Relative: 0 %
Eosinophils Absolute: 0 10*3/uL (ref 0.0–0.5)
Eosinophils Relative: 0 %
HCT: 32.6 % — ABNORMAL LOW (ref 36.0–46.0)
Hemoglobin: 11.4 g/dL — ABNORMAL LOW (ref 12.0–15.0)
Immature Granulocytes: 1 %
Lymphocytes Relative: 36 %
Lymphs Abs: 2.6 10*3/uL (ref 0.7–4.0)
MCH: 30.2 pg (ref 26.0–34.0)
MCHC: 35 g/dL (ref 30.0–36.0)
MCV: 86.5 fL (ref 80.0–100.0)
Monocytes Absolute: 0.6 10*3/uL (ref 0.1–1.0)
Monocytes Relative: 8 %
Neutro Abs: 4 10*3/uL (ref 1.7–7.7)
Neutrophils Relative %: 55 %
Platelets: 162 10*3/uL (ref 150–400)
RBC: 3.77 MIL/uL — ABNORMAL LOW (ref 3.87–5.11)
RDW: 14.2 % (ref 11.5–15.5)
WBC: 7.3 10*3/uL (ref 4.0–10.5)
nRBC: 0 % (ref 0.0–0.2)

## 2022-06-28 LAB — PROTIME-INR
INR: 1.3 — ABNORMAL HIGH (ref 0.8–1.2)
Prothrombin Time: 15.6 seconds — ABNORMAL HIGH (ref 11.4–15.2)

## 2022-06-28 LAB — GLUCOSE, CAPILLARY
Glucose-Capillary: 289 mg/dL — ABNORMAL HIGH (ref 70–99)
Glucose-Capillary: 139 mg/dL — ABNORMAL HIGH (ref 70–99)
Glucose-Capillary: 115 mg/dL — ABNORMAL HIGH (ref 70–99)
Glucose-Capillary: 132 mg/dL — ABNORMAL HIGH (ref 70–99)
Glucose-Capillary: 300 mg/dL — ABNORMAL HIGH (ref 70–99)
Glucose-Capillary: 130 mg/dL — ABNORMAL HIGH (ref 70–99)
Glucose-Capillary: 107 mg/dL — ABNORMAL HIGH (ref 70–99)

## 2022-06-28 LAB — AMMONIA: Ammonia: 41 umol/L — ABNORMAL HIGH (ref 9–35)

## 2022-06-28 MED ORDER — HYDROCHLOROTHIAZIDE 12.5 MG PO TABS
12.5000 mg | ORAL_TABLET | Freq: Every day | ORAL | Status: DC
  Administered 2022-06-28 – 2022-07-01 (×4): 12.5 mg via ORAL
  Filled 2022-06-28 (×4): qty 1

## 2022-06-28 MED ORDER — SENNOSIDES-DOCUSATE SODIUM 8.6-50 MG PO TABS
1.0000 | ORAL_TABLET | Freq: Every evening | ORAL | Status: DC | PRN
  Administered 2022-06-29: 1 via ORAL
  Filled 2022-06-28: qty 1

## 2022-06-28 MED ORDER — ONDANSETRON HCL 4 MG/2ML IJ SOLN: 4.0000 mg | Freq: Four times a day (QID) | INTRAMUSCULAR | Status: DC | PRN

## 2022-06-28 MED ORDER — INSULIN DEGLUDEC 100 UNIT/ML ~~LOC~~ SOPN: 22.0000 [IU] | PEN_INJECTOR | Freq: Every day | SUBCUTANEOUS | Status: DC

## 2022-06-28 MED ORDER — LOSARTAN POTASSIUM 50 MG PO TABS
100.0000 mg | ORAL_TABLET | Freq: Every day | ORAL | Status: DC
  Administered 2022-06-28 – 2022-07-01 (×4): 100 mg via ORAL
  Filled 2022-06-28 (×4): qty 2

## 2022-06-28 MED ORDER — DONEPEZIL HCL 10 MG PO TBDP: 10.0000 mg | ORAL_TABLET | Freq: Every day | ORAL | Status: DC

## 2022-06-28 MED ORDER — LOSARTAN POTASSIUM-HCTZ 100-12.5 MG PO TABS: 1.0000 | ORAL_TABLET | Freq: Every day | ORAL | Status: DC

## 2022-06-28 MED ORDER — ONDANSETRON HCL 4 MG PO TABS: 4.0000 mg | ORAL_TABLET | Freq: Four times a day (QID) | ORAL | Status: DC | PRN

## 2022-06-28 MED ORDER — INSULIN GLARGINE-YFGN 100 UNIT/ML ~~LOC~~ SOLN
20.0000 [IU] | Freq: Every day | SUBCUTANEOUS | Status: DC
  Administered 2022-06-29: 20 [IU] via SUBCUTANEOUS
  Filled 2022-06-28 (×3): qty 0.2

## 2022-06-28 MED ORDER — AMLODIPINE BESYLATE 5 MG PO TABS
5.0000 mg | ORAL_TABLET | Freq: Every day | ORAL | Status: DC
  Administered 2022-06-28 – 2022-07-01 (×4): 5 mg via ORAL
  Filled 2022-06-28 (×4): qty 1

## 2022-06-28 MED ORDER — HYDROMORPHONE HCL 1 MG/ML IJ SOLN: 0.5000 mg | INTRAMUSCULAR | Status: DC | PRN

## 2022-06-28 MED ORDER — INSULIN DEGLUDEC 100 UNIT/ML ~~LOC~~ SOPN: 20.0000 [IU] | PEN_INJECTOR | Freq: Every day | SUBCUTANEOUS | Status: DC

## 2022-06-28 MED ORDER — INSULIN DEGLUDEC 100 UNIT/ML ~~LOC~~ SOPN: 11.0000 [IU] | PEN_INJECTOR | Freq: Every day | SUBCUTANEOUS | Status: DC

## 2022-06-28 MED ORDER — HEPARIN SODIUM (PORCINE) 5000 UNIT/ML IJ SOLN
5000.0000 [IU] | Freq: Three times a day (TID) | INTRAMUSCULAR | Status: AC
  Administered 2022-06-28 – 2022-06-29 (×5): 5000 [IU] via SUBCUTANEOUS
  Filled 2022-06-28 (×5): qty 1

## 2022-06-28 MED ORDER — POTASSIUM CHLORIDE IN NACL 20-0.9 MEQ/L-% IV SOLN
INTRAVENOUS | Status: DC
  Filled 2022-06-28 (×3): qty 1000

## 2022-06-28 MED ORDER — SODIUM CHLORIDE 0.9 % IV SOLN: INTRAVENOUS | Status: DC

## 2022-06-28 MED ORDER — DONEPEZIL HCL 10 MG PO TABS
10.0000 mg | ORAL_TABLET | Freq: Every day | ORAL | Status: DC
  Administered 2022-06-28 – 2022-06-30 (×3): 10 mg via ORAL
  Filled 2022-06-28 (×3): qty 1

## 2022-06-28 MED ORDER — INSULIN ASPART 100 UNIT/ML IJ SOLN
0.0000 [IU] | INTRAMUSCULAR | Status: DC
  Administered 2022-06-28: 8 [IU] via SUBCUTANEOUS
  Administered 2022-06-28 (×2): 2 [IU] via SUBCUTANEOUS
  Administered 2022-06-28: 8 [IU] via SUBCUTANEOUS
  Administered 2022-06-29: 2 [IU] via SUBCUTANEOUS
  Administered 2022-06-29: 5 [IU] via SUBCUTANEOUS
  Administered 2022-06-29 (×3): 3 [IU] via SUBCUTANEOUS
  Administered 2022-06-30: 15 [IU] via SUBCUTANEOUS
  Administered 2022-06-30: 5 [IU] via SUBCUTANEOUS
  Administered 2022-06-30 (×2): 3 [IU] via SUBCUTANEOUS
  Administered 2022-06-30: 5 [IU] via SUBCUTANEOUS
  Administered 2022-07-01 (×3): 3 [IU] via SUBCUTANEOUS

## 2022-06-28 NOTE — Consult Note (Signed)
Reason for Consult: Elevated liver enzymes and possible choledocholithiasis Referring Physician: Family Medicine  [name] HPI: This is a 82 year old female with a PMH of left breast cancer s/p mastectomy on 05/15/2022, dementia, HTN, and CKD originally admitted for elevated liver enzymes and biliary colic.  Further evaluation with an MRCP showed that she had innumerable calcified small gallstones.  A lap chole was performed on 06/25/2022.  There was a transient improvement in her liver enzymes and then her values started to increase.  A repeat MRCP was performed, but it was degraded by motion.  The admission MRCP was negative for biliary ductal dilation.  The patient does not have any fever or leukocytosis.  Past Medical History:  Diagnosis Date   Arthritis    Atherosclerosis of aorta    CKD (chronic kidney disease) stage 3, GFR 30-59 ml/min    Dementia    Diabetes mellitus without complication    Dysrhythmia    Heart murmur    Hypertension    Type 1 diabetes     Past Surgical History:  Procedure Laterality Date   AXILLARY LYMPH NODE BIOPSY Left 04/07/2022   bx done, hydromarker placed, path pending   BREAST BIOPSY Left 04/07/2022   [place] bx mass, ribbon marker, path pending   BREAST BIOPSY Left 04/07/2022   [place] LT BREAST BX W LOC DEV 1ST LESION IMG BX SPEC US GUIDE 04/07/2022 [facility]   BREAST EXCISIONAL BIOPSY Left    neg   BREAST SURGERY  1970   EYE SURGERY     detached retina   MASTECTOMY MODIFIED RADICAL Left 05/25/2022   Procedure: MASTECTOMY MODIFIED RADICAL;  Surgeon: Carolan Shiver, MD;  Location: [facility]S;  Service: General;  Laterality: Left;    Family History  Problem Relation Age of Onset   Alzheimer's disease Mother    Diabetes Mother    Arthritis Mother    Diabetes Father    Hypertension Father    Hyperlipidemia Father    Stroke Brother    Multiple sclerosis Daughter     Social History:  reports that she has never smoked. She has never used  smokeless tobacco. She reports that she does not drink alcohol and does not use drugs.  Allergies:  Allergies  Allergen Reactions   Colesevelam Hcl     scotomas   Daucus Carota    Penicillins Itching    Medications: Scheduled:  amLODipine  5 mg Oral Daily   donepezil  10 mg Oral QHS   heparin  5,000 Units Subcutaneous Q8H   losartan  100 mg Oral Daily   And   hydrochlorothiazide  12.5 mg Oral Daily   insulin aspart  0-15 Units Subcutaneous Q4H   insulin glargine-yfgn  20 Units Subcutaneous Daily   Continuous:  0.9 % NaCl with KCl 20 mEq / L 75 mL/hr at 06/28/22 0229    Results for orders placed or performed during the hospital encounter of 06/28/22 (from the past 24 hour(s))  Glucose, capillary     Status: Abnormal   Collection Time: 06/28/22  2:32 AM  Result Value Ref Range   Glucose-Capillary 289 (H) 70 - 99 mg/dL  Comprehensive metabolic panel     Status: Abnormal   Collection Time: 06/28/22  3:13 AM  Result Value Ref Range   Sodium 132 (L) 135 - 145 mmol/L   Potassium 3.7 3.5 - 5.1 mmol/L   Chloride 101 98 - 111 mmol/L   CO2 22 22 - 32 mmol/L   Glucose,  Bld 325 (H) 70 - 99 mg/dL   BUN 17 8 - 23 mg/dL   Creatinine, Ser 1.61 0.44 - 1.00 mg/dL   Calcium 8.9 8.9 - 09.6 mg/dL   Total Protein 6.3 (L) 6.5 - 8.1 g/dL   Albumin 2.9 (L) 3.5 - 5.0 g/dL   AST 045 (H) 15 - 41 U/L   ALT 544 (H) 0 - 44 U/L   Alkaline Phosphatase 356 (H) 38 - 126 U/L   Total Bilirubin 2.6 (H) 0.3 - 1.2 mg/dL   GFR, Estimated >40 >98 mL/min   Anion gap 9 5 - 15  CBC with Differential/Platelet     Status: Abnormal   Collection Time: 06/28/22  3:13 AM  Result Value Ref Range   WBC 7.3 4.0 - 10.5 K/uL   RBC 3.77 (L) 3.87 - 5.11 MIL/uL   Hemoglobin 11.4 (L) 12.0 - 15.0 g/dL   HCT 11.9 (L) 14.7 - 82.9 %   MCV 86.5 80.0 - 100.0 fL   MCH 30.2 26.0 - 34.0 pg   MCHC 35.0 30.0 - 36.0 g/dL   RDW 56.2 13.0 - 86.5 %   Platelets 162 150 - 400 K/uL   nRBC 0.0 0.0 - 0.2 %   Neutrophils Relative % 55  %   Neutro Abs 4.0 1.7 - 7.7 K/uL   Lymphocytes Relative 36 %   Lymphs Abs 2.6 0.7 - 4.0 K/uL   Monocytes Relative 8 %   Monocytes Absolute 0.6 0.1 - 1.0 K/uL   Eosinophils Relative 0 %   Eosinophils Absolute 0.0 0.0 - 0.5 K/uL   Basophils Relative 0 %   Basophils Absolute 0.0 0.0 - 0.1 K/uL   Immature Granulocytes 1 %   Abs Immature Granulocytes 0.06 0.00 - 0.07 K/uL  Protime-INR     Status: Abnormal   Collection Time: 06/28/22  3:13 AM  Result Value Ref Range   Prothrombin Time 15.6 (H) 11.4 - 15.2 seconds   INR 1.3 (H) 0.8 - 1.2  Ammonia     Status: Abnormal   Collection Time: 06/28/22  3:13 AM  Result Value Ref Range   Ammonia 41 (H) 9 - 35 umol/L  Glucose, capillary     Status: Abnormal   Collection Time: 06/28/22  6:38 AM  Result Value Ref Range   Glucose-Capillary 139 (H) 70 - 99 mg/dL     MR ABDOMEN MRCP WO CONTRAST  Result Date: 06/27/2022 CLINICAL DATA:  82 year old female status post cholecystectomy presenting with history of jaundice. Suspected retained choledocholithiasis. EXAM: MRI ABDOMEN WITHOUT CONTRAST  (INCLUDING MRCP) TECHNIQUE: Multiplanar multisequence MR imaging of the abdomen was performed. Heavily T2-weighted images of the biliary and pancreatic ducts were obtained, and three-dimensional MRCP images were rendered by post processing. COMPARISON:  Abdominal MRI with MRCP 06/24/2022. FINDINGS: Comment: Today's study is limited for detection of visceral and/or vascular lesions by lack of IV gadolinium. Considerable respiratory motion also limits portions of today's examination. Lower chest: Unremarkable. Hepatobiliary: No suspicious cystic or solid hepatic lesions are confidently identified on today's noncontrast examination. Status post cholecystectomy. Trace amount of T2 hyperintense fluid in the gallbladder fossa, within normal limits in this postoperative patient. This fluid does not appear to exert mass effect or have organized properties to strongly suggest  abscess or definite biloma at this time. MRCP images are severely limited by motion artifact. With these limitations in mind, there is no substantial intrahepatic biliary ductal dilatation. Common bile duct is normal in caliber measuring 4 mm in the porta  hepatis. Accurate assessment for choledocholithiasis is not possible on today's motion limited examination, but no definite filling defects are confidently identified in the common bile duct to clearly indicate the presence of retained ductal stones. Pancreas: No pancreatic mass noted on today's limited noncontrast examination. No pancreatic ductal dilatation. No peripancreatic fluid collections or inflammatory changes. Spleen:  Unremarkable. Adrenals/Urinary Tract: T1 hypointense and T2 hyperintense lesions in the kidneys bilaterally, incompletely characterized on today's noncontrast CT examination, but statistically likely to represent cysts (no imaging follow-up recommended), largest of which measures 1.3 cm in the lateral aspect of the lower pole of the right kidney. No hydroureteronephrosis in the visualized portions of the abdomen. Bilateral adrenal glands are normal in appearance. Stomach/Bowel: Visualized portions are unremarkable. Vascular/Lymphatic: No aneurysm identified in the visualized abdominal vasculature. No definite lymphadenopathy noted in the abdomen. Other: No significant volume of ascites noted in the visualized portions of the peritoneal cavity. Musculoskeletal: Soft tissue edema in the flank regions bilaterally, similar to the prior study. No aggressive appearing osseous lesions are noted in the visualized portions of the skeleton. IMPRESSION: 1. Limited study secondary to patient respiratory motion and lack of IV contrast administration. With these limitations in mind, there is no definite choledocholithiasis or findings of biliary tract obstruction. Small postoperative fluid collection in the gallbladder fossa is within normal limits given  the recent cholecystectomy. Electronically Signed   By: Trudie Reed M.D.   On: 06/27/2022 12:30   MR 3D Recon At Scanner  Result Date: 06/27/2022 CLINICAL DATA:  82 year old female status post cholecystectomy presenting with history of jaundice. Suspected retained choledocholithiasis. EXAM: MRI ABDOMEN WITHOUT CONTRAST  (INCLUDING MRCP) TECHNIQUE: Multiplanar multisequence MR imaging of the abdomen was performed. Heavily T2-weighted images of the biliary and pancreatic ducts were obtained, and three-dimensional MRCP images were rendered by post processing. COMPARISON:  Abdominal MRI with MRCP 06/24/2022. FINDINGS: Comment: Today's study is limited for detection of visceral and/or vascular lesions by lack of IV gadolinium. Considerable respiratory motion also limits portions of today's examination. Lower chest: Unremarkable. Hepatobiliary: No suspicious cystic or solid hepatic lesions are confidently identified on today's noncontrast examination. Status post cholecystectomy. Trace amount of T2 hyperintense fluid in the gallbladder fossa, within normal limits in this postoperative patient. This fluid does not appear to exert mass effect or have organized properties to strongly suggest abscess or definite biloma at this time. MRCP images are severely limited by motion artifact. With these limitations in mind, there is no substantial intrahepatic biliary ductal dilatation. Common bile duct is normal in caliber measuring 4 mm in the porta hepatis. Accurate assessment for choledocholithiasis is not possible on today's motion limited examination, but no definite filling defects are confidently identified in the common bile duct to clearly indicate the presence of retained ductal stones. Pancreas: No pancreatic mass noted on today's limited noncontrast examination. No pancreatic ductal dilatation. No peripancreatic fluid collections or inflammatory changes. Spleen:  Unremarkable. Adrenals/Urinary Tract: T1  hypointense and T2 hyperintense lesions in the kidneys bilaterally, incompletely characterized on today's noncontrast CT examination, but statistically likely to represent cysts (no imaging follow-up recommended), largest of which measures 1.3 cm in the lateral aspect of the lower pole of the right kidney. No hydroureteronephrosis in the visualized portions of the abdomen. Bilateral adrenal glands are normal in appearance. Stomach/Bowel: Visualized portions are unremarkable. Vascular/Lymphatic: No aneurysm identified in the visualized abdominal vasculature. No definite lymphadenopathy noted in the abdomen. Other: No significant volume of ascites noted in the visualized portions of the peritoneal  cavity. Musculoskeletal: Soft tissue edema in the flank regions bilaterally, similar to the prior study. No aggressive appearing osseous lesions are noted in the visualized portions of the skeleton. IMPRESSION: 1. Limited study secondary to patient respiratory motion and lack of IV contrast administration. With these limitations in mind, there is no definite choledocholithiasis or findings of biliary tract obstruction. Small postoperative fluid collection in the gallbladder fossa is within normal limits given the recent cholecystectomy. Electronically Signed   By: Trudie Reed M.D.   On: 06/27/2022 12:30    ROS:  As stated above in the HPI otherwise negative.  Blood pressure (!) 170/68, pulse 66, temperature 98.4 F (36.9 C), temperature source Oral, resp. rate 17, weight 58.8 kg, SpO2 94 %.    PE: Gen: NAD, Alert and Oriented HEENT:  [place]/AT, EOMI Neck: Supple, no LAD Lungs: CTA Bilaterally CV: RRR without M/G/R ABD: Soft, NTND, +BS Ext: No C/C/E  Assessment/Plan: 1) Probable choledocholithiasis. 2) Elevated liver enzymes. 3) S/p lap chole.   With her history of the small gallstones, and the elevation of her liver enzymes, it is possible that she has some small stones in her CBD.  Plan: 1) ERCP  tomorrow with [facility].  Trine Fread D 06/28/2022, 8:40 AM

## 2022-06-28 NOTE — Progress Notes (Signed)
SCD PLACED PER ORDER.

## 2022-06-28 NOTE — Hospital Course (Addendum)
Same day note   Patient is a 82 year old female with past medical history of history of DM 2, dementia, HTN, CKD stage III, left breast cancer, sp mastectomy 05/15/22 who was recently admitted to [facility] for cholecystitis without cholelithiasis and cholecystectomy was done on 4/18.  Postoperatively patient had elevated bilirubin and LFTs so patient was transferred to [facility] for possible ERCP endoscopic ultrasound.     Patient was admitted to the hospital for elevated bilirubin.  At the time of my evaluation, patient complains of  Physical examination reveals  Laboratory data and imaging was reviewed  Assessment and Plan.  Abnormal LFTs/jaundice Patient was transferred from [facility].  GI has been contacted.  Continue n.p.o. IV fluids antiemetics analgesics as necessary.  MRI of the abdomen done 06/27/2022 showed limited study with no definite choledocholithiasis.  LFT elevated but slightly down from yesterday.  Breast cancer, s/p 05/15/22 left unilateral modified radical mastectomy Letrozole on hold due to elevated LFTs.   Dementia Continue Aricept   Hyperlipidemia Continue statins    Hypertension Continue Hyzaar, Norvasc   Diabetes mellitus type 2. -Continue sliding scale insulin.    No Charge  Signed,  Tenny Craw, MD [facility]

## 2022-06-28 NOTE — Progress Notes (Signed)
Same day note  Patient is a 82 year old female with past medical history of history of DM 2, dementia, HTN, CKD stage III, left breast cancer, sp mastectomy 05/15/22 who was recently admitted to [facility] for cholecystitis without cholelithiasis and cholecystectomy was done on 4/18.  Postoperatively patient had elevated bilirubin and LFTs so patient was transferred to [facility] for possible ERCP endoscopic ultrasound.     Patient was admitted to the hospital for elevated bilirubin.  At the time of my evaluation, patient complains of mild abdominal discomfort.  No fever chills nausea vomiting.  Physical examination reveals clean dry and intact surgical wound.  Alert awake oriented.  Laboratory data and imaging was reviewed  Assessment and Plan.  Abnormal LFTs/jaundice probable choledocholithiasis. Patient was transferred from [facility].  GI on board.  Continue clears, IV fluids antiemetics analgesics as necessary.  MRI of the abdomen done 06/27/2022 showed limited study with no definite choledocholithiasis.  LFT elevated but slightly down from yesterday.  Plan for tentative plan ERCP 06/29/2022.  Keep n.p.o. after midnight.  Check LFTs in AM.  Breast cancer, s/p 05/15/22 left unilateral modified radical mastectomy Letrozole on hold due to elevated LFTs.   Dementia Continue Aricept   Hyperlipidemia Continue statins    Hypertension Continue Hyzaar, Norvasc   Diabetes mellitus type 2. -Continue sliding scale insulin.    No Charge  Signed,  Tenny Craw, MD [facility]

## 2022-06-28 NOTE — Progress Notes (Signed)
Patient has arrived to [facility] from [facility] via [facility]. [facility] Admits paged to notify of patient's arrival. Registration notified.

## 2022-06-28 NOTE — H&P (Addendum)
PCP:   Alba Cory, MD   Chief Complaint:  Elevated bilirubin.  HPI: This is a pleasant 82 year old female who presents with history of DM 2, dementia, HTN, CKD stage III, and a current history of left breast cancer, sp mastectomy 05/15/22.  Patient is admitted at [facility] 4/17- 4/21, diagnosed with cholelithiasis with jaundice and biliary colic.  At the time of admission patient's AST was 343, ALT 249, T. bili 1.5.  MRI abdomen and pelvis confirmed cholecystitis without cholelithiasis and no evidence of biliary dilatation or definite choledocholithiasis.  Cholecystectomy done 4/18.  On subsequent follow-up T bili normalized to 1.0.  Postop day 1 total bili was again elevated.  Repeat MRCP done postop day 3 again confirmed no evidence of choledocholithiasis.  As total and direct bilirubin continued to uptrend the decision was made to transfer patient for ERCP or endoscopic [place], in an attempt to diagnose and treat if retained stone after cholecsytectomy.  Dr. Elnoria Howard contacted by surgeon, transfer arranged.  AST/ALT were also uptrending  Review of Systems:  The patient denies anorexia, fever, weight loss,, vision loss, decreased hearing, hoarseness, chest pain, syncope, dyspnea on exertion, peripheral edema, balance deficits, hemoptysis, abdominal pain, melena, hematochezia, severe indigestion/heartburn, hematuria, incontinence, genital sores, muscle weakness, suspicious skin lesions, transient blindness, difficulty walking, depression, unusual weight change, abnormal bleeding, enlarged lymph nodes, angioedema, and breast masses. Positives: Elevated bilirubin, LFTs  Past Medical History: Past Medical History:  Diagnosis Date   Arthritis    Atherosclerosis of aorta    CKD (chronic kidney disease) stage 3, GFR 30-59 ml/min    Dementia    Diabetes mellitus without complication    Dysrhythmia    Heart murmur    Hypertension    Type 1 diabetes    Past Surgical History:  Procedure  Laterality Date   AXILLARY LYMPH NODE BIOPSY Left 04/07/2022   bx done, hydromarker placed, path pending   BREAST BIOPSY Left 04/07/2022   [place] bx mass, ribbon marker, path pending   BREAST BIOPSY Left 04/07/2022   [place] LT BREAST BX W LOC DEV 1ST LESION IMG BX SPEC US GUIDE 04/07/2022 [facility]   BREAST EXCISIONAL BIOPSY Left    neg   BREAST SURGERY  1970   EYE SURGERY     detached retina   MASTECTOMY MODIFIED RADICAL Left 05/25/2022   Procedure: MASTECTOMY MODIFIED RADICAL;  Surgeon: Carolan Shiver, MD;  Location: [facility] ORS;  Service: General;  Laterality: Left;    Medications: Prior to Admission medications   Medication Sig Start Date End Date Taking? Authorizing Provider  Accu-Chek Softclix Lancets lancets  08/26/20   [provider]  alendronate (FOSAMAX) 70 MG tablet Take with a full glass of water on an empty stomach. Patient taking differently: 70 mg once a week. Take with a full glass of water on an empty stomach. 01/27/22   Alba Cory, MD  amLODipine (NORVASC) 5 MG tablet Take 1 tablet (5 mg total) by mouth daily. 01/27/22   Alba Cory, MD  atorvastatin (LIPITOR) 80 MG tablet Take 1 tablet (80 mg total) by mouth daily. 01/27/22   Alba Cory, MD  blood glucose meter kit and supplies  10/02/13   [provider]  Calcium Carb-Cholecalciferol 600-10 MG-MCG TABS Take 1 tablet by mouth 2 (two) times daily.    [provider]  [employer] ALCOHOL SWABS PADS USE 4 TIMES DAILY TO WIPE SKIN BEFORE USING INSULIN 04/08/15   Carlynn Purl, Danna Hefty, MD  donepezil (ARICEPT ODT) 10 MG disintegrating tablet  TAKE 1 TABLET BY MOUTH EVERYDAY AT BEDTIME Patient taking differently: Take 10 mg by mouth at bedtime. TAKE 1 TABLET BY MOUTH EVERYDAY AT BEDTIME 01/27/22   Alba Cory, MD  Glucagon (BAQSIMI ONE PACK) 3 MG/DOSE POWD  09/13/20   [provider]  hydrALAZINE (APRESOLINE) 10 MG tablet TAKE 1 TABLET (10 MG TOTAL) BY MOUTH 3 (THREE) TIMES DAILY AS  NEEDED. BP ABOVE 150/90 Patient taking differently: Take 10 mg by mouth 3 (three) times daily as needed (BP above 150/90). 04/28/22   Carlynn Purl, Danna Hefty, MD  insulin aspart (NOVOLOG) 100 UNIT/ML FlexPen Inject 6-10 Units into the skin 3 (three) times daily with meals. 12/05/20   [provider]  insulin degludec (TRESIBA FLEXTOUCH) 100 UNIT/ML FlexTouch Pen Inject 22 Units into the skin daily. qam 12/05/20   Sherlon Handing, MD  losartan-hydrochlorothiazide (HYZAAR) 100-12.5 MG tablet Take 1 tablet by mouth daily. 01/27/22   Alba Cory, MD  traMADol (ULTRAM) 50 MG tablet Take 1 tablet (50 mg total) by mouth every 6 (six) hours as needed. 05/26/22 05/26/23  Carolan Shiver, MD    Allergies:   Allergies  Allergen Reactions   Colesevelam Hcl     scotomas   Daucus Carota    Penicillins Itching    Social History:  reports that she has never smoked. She has never used smokeless tobacco. She reports that she does not drink alcohol and does not use drugs.  Family History: Family History  Problem Relation Age of Onset   Alzheimer's disease Mother    Diabetes Mother    Arthritis Mother    Diabetes Father    Hypertension Father    Hyperlipidemia Father    Stroke Brother    Multiple sclerosis Daughter     Physical Exam: There were no vitals filed for this visit.  General:  Alert and oriented times three, well developed and nourished, no acute distress Eyes: PERRLA, pink conjunctiva, no scleral icterus ENT: Moist oral mucosa, neck supple, no thyromegaly Lungs: clear to ascultation, no wheeze, no crackles, no use of accessory muscles Cardiovascular: regular rate and rhythm, no regurgitation, no gallops, no murmurs. No carotid bruits, no JVD Abdomen: soft, positive BS, non-tender, non-distended, no organomegaly, not an acute abdomen.  Surgical wound dry and clean.  Expected postop TTP GU: not examined Neuro: CN II - XII grossly intact, sensation intact Musculoskeletal:  strength 5/5 all extremities, no clubbing, cyanosis or edema Skin: no rash, no subcutaneous crepitation, no decubitus Psych: appropriate patient   Labs on Admission:  Recent Labs    06/26/22 0500 06/27/22 0546  NA 138 138  K 3.8 3.3*  CL 104 103  CO2 25 28  GLUCOSE 236* 195*  BUN 17 16  CREATININE 0.83 0.58  CALCIUM 9.0 9.4  MG 1.7 1.7  PHOS 3.4 2.8   Recent Labs    06/26/22 0500 06/27/22 0546  AST 693* 626*  ALT 560* 672*  ALKPHOS 341* 372*  BILITOT 1.8* 2.5*  PROT 6.5 6.8  ALBUMIN 3.1* 3.2*   Recent Labs    06/26/22 0500 06/27/22 0546  LIPASE 22 38   Recent Labs    06/26/22 0500 06/27/22 0546  WBC 7.1 8.1  HGB 11.5* 12.0  HCT 33.8* 35.2*  MCV 87.8 86.9  PLT 194 187     Radiological Exams on Admission: MR ABDOMEN MRCP WO CONTRAST  Result Date: 06/27/2022 CLINICAL DATA:  82 year old female status post cholecystectomy presenting with history of jaundice. Suspected retained choledocholithiasis. EXAM: MRI ABDOMEN WITHOUT  CONTRAST  (INCLUDING MRCP) TECHNIQUE: Multiplanar multisequence MR imaging of the abdomen was performed. Heavily T2-weighted images of the biliary and pancreatic ducts were obtained, and three-dimensional MRCP images were rendered by post processing. COMPARISON:  Abdominal MRI with MRCP 06/24/2022. FINDINGS: Comment: Today's study is limited for detection of visceral and/or vascular lesions by lack of IV gadolinium. Considerable respiratory motion also limits portions of today's examination. Lower chest: Unremarkable. Hepatobiliary: No suspicious cystic or solid hepatic lesions are confidently identified on today's noncontrast examination. Status post cholecystectomy. Trace amount of T2 hyperintense fluid in the gallbladder fossa, within normal limits in this postoperative patient. This fluid does not appear to exert mass effect or have organized properties to strongly suggest abscess or definite biloma at this time. MRCP images are severely limited  by motion artifact. With these limitations in mind, there is no substantial intrahepatic biliary ductal dilatation. Common bile duct is normal in caliber measuring 4 mm in the porta hepatis. Accurate assessment for choledocholithiasis is not possible on today's motion limited examination, but no definite filling defects are confidently identified in the common bile duct to clearly indicate the presence of retained ductal stones. Pancreas: No pancreatic mass noted on today's limited noncontrast examination. No pancreatic ductal dilatation. No peripancreatic fluid collections or inflammatory changes. Spleen:  Unremarkable. Adrenals/Urinary Tract: T1 hypointense and T2 hyperintense lesions in the kidneys bilaterally, incompletely characterized on today's noncontrast CT examination, but statistically likely to represent cysts (no imaging follow-up recommended), largest of which measures 1.3 cm in the lateral aspect of the lower pole of the right kidney. No hydroureteronephrosis in the visualized portions of the abdomen. Bilateral adrenal glands are normal in appearance. Stomach/Bowel: Visualized portions are unremarkable. Vascular/Lymphatic: No aneurysm identified in the visualized abdominal vasculature. No definite lymphadenopathy noted in the abdomen. Other: No significant volume of ascites noted in the visualized portions of the peritoneal cavity. Musculoskeletal: Soft tissue edema in the flank regions bilaterally, similar to the prior study. No aggressive appearing osseous lesions are noted in the visualized portions of the skeleton. IMPRESSION: 1. Limited study secondary to patient respiratory motion and lack of IV contrast administration. With these limitations in mind, there is no definite choledocholithiasis or findings of biliary tract obstruction. Small postoperative fluid collection in the gallbladder fossa is within normal limits given the recent cholecystectomy. Electronically Signed   By: Trudie Reed  M.D.   On: 06/27/2022 12:30   MR 3D Recon At Scanner  Result Date: 06/27/2022 CLINICAL DATA:  82 year old female status post cholecystectomy presenting with history of jaundice. Suspected retained choledocholithiasis. EXAM: MRI ABDOMEN WITHOUT CONTRAST  (INCLUDING MRCP) TECHNIQUE: Multiplanar multisequence MR imaging of the abdomen was performed. Heavily T2-weighted images of the biliary and pancreatic ducts were obtained, and three-dimensional MRCP images were rendered by post processing. COMPARISON:  Abdominal MRI with MRCP 06/24/2022. FINDINGS: Comment: Today's study is limited for detection of visceral and/or vascular lesions by lack of IV gadolinium. Considerable respiratory motion also limits portions of today's examination. Lower chest: Unremarkable. Hepatobiliary: No suspicious cystic or solid hepatic lesions are confidently identified on today's noncontrast examination. Status post cholecystectomy. Trace amount of T2 hyperintense fluid in the gallbladder fossa, within normal limits in this postoperative patient. This fluid does not appear to exert mass effect or have organized properties to strongly suggest abscess or definite biloma at this time. MRCP images are severely limited by motion artifact. With these limitations in mind, there is no substantial intrahepatic biliary ductal dilatation. Common bile duct is normal in caliber  measuring 4 mm in the porta hepatis. Accurate assessment for choledocholithiasis is not possible on today's motion limited examination, but no definite filling defects are confidently identified in the common bile duct to clearly indicate the presence of retained ductal stones. Pancreas: No pancreatic mass noted on today's limited noncontrast examination. No pancreatic ductal dilatation. No peripancreatic fluid collections or inflammatory changes. Spleen:  Unremarkable. Adrenals/Urinary Tract: T1 hypointense and T2 hyperintense lesions in the kidneys bilaterally, incompletely  characterized on today's noncontrast CT examination, but statistically likely to represent cysts (no imaging follow-up recommended), largest of which measures 1.3 cm in the lateral aspect of the lower pole of the right kidney. No hydroureteronephrosis in the visualized portions of the abdomen. Bilateral adrenal glands are normal in appearance. Stomach/Bowel: Visualized portions are unremarkable. Vascular/Lymphatic: No aneurysm identified in the visualized abdominal vasculature. No definite lymphadenopathy noted in the abdomen. Other: No significant volume of ascites noted in the visualized portions of the peritoneal cavity. Musculoskeletal: Soft tissue edema in the flank regions bilaterally, similar to the prior study. No aggressive appearing osseous lesions are noted in the visualized portions of the skeleton. IMPRESSION: 1. Limited study secondary to patient respiratory motion and lack of IV contrast administration. With these limitations in mind, there is no definite choledocholithiasis or findings of biliary tract obstruction. Small postoperative fluid collection in the gallbladder fossa is within normal limits given the recent cholecystectomy. Electronically Signed   By: Trudie Reed M.D.   On: 06/27/2022 12:30    Assessment/Plan Present on Admission:  Abnormal LFTs/jaundice -Patient transferred from [place] -Dr. Elnoria Howard contacted via epic chat -N.p.o., gentle IV fluid hydration -CMP now and in a.m. -Dilaudid as needed pain medications -No fever, no antibiotics.   Breast cancer, s/p 05/15/22 left unilateral modified radical mastectomy -Stage II.  Per oncology -Letrozole on hold, this can uncommonly cause hepatitis with autoimmune feature   Dementia -Aricept resumed   Hyperlipidemia -Statin held at this evening because of elevated LFTs   Hypertension -Hyzaar, Norvasc resumed   Diabetes (HCC) -SSI, home meds resumed   Erline Siddoway 06/28/2022, 1:36 AM

## 2022-06-29 ENCOUNTER — Ambulatory Visit: Payer: Medicare HMO

## 2022-06-29 DIAGNOSIS — F0390 Unspecified dementia without behavioral disturbance: Secondary | ICD-10-CM | POA: Diagnosis not present

## 2022-06-29 DIAGNOSIS — E1159 Type 2 diabetes mellitus with other circulatory complications: Secondary | ICD-10-CM | POA: Diagnosis not present

## 2022-06-29 DIAGNOSIS — R7989 Other specified abnormal findings of blood chemistry: Secondary | ICD-10-CM | POA: Diagnosis not present

## 2022-06-29 DIAGNOSIS — C50919 Malignant neoplasm of unspecified site of unspecified female breast: Secondary | ICD-10-CM | POA: Diagnosis not present

## 2022-06-29 LAB — BASIC METABOLIC PANEL
Anion gap: 8 (ref 5–15)
BUN: 8 mg/dL (ref 8–23)
CO2: 25 mmol/L (ref 22–32)
Calcium: 8.8 mg/dL — ABNORMAL LOW (ref 8.9–10.3)
Chloride: 104 mmol/L (ref 98–111)
Creatinine, Ser: 0.66 mg/dL (ref 0.44–1.00)
GFR, Estimated: >60 mL/min (ref >60)
Glucose, Bld: 167 mg/dL — ABNORMAL HIGH (ref 70–99)
Potassium: 3.6 mmol/L (ref 3.5–5.1)
Sodium: 137 mmol/L (ref 135–145)

## 2022-06-29 LAB — CBC
HCT: 31.9 % — ABNORMAL LOW (ref 36.0–46.0)
Hemoglobin: 10.9 g/dL — ABNORMAL LOW (ref 12.0–15.0)
MCH: 30.1 pg (ref 26.0–34.0)
MCHC: 34.2 g/dL (ref 30.0–36.0)
MCV: 88.1 fL (ref 80.0–100.0)
Platelets: 160 10*3/uL (ref 150–400)
RBC: 3.62 MIL/uL — ABNORMAL LOW (ref 3.87–5.11)
RDW: 14.5 % (ref 11.5–15.5)
WBC: 7 10*3/uL (ref 4.0–10.5)
nRBC: 0 % (ref 0.0–0.2)

## 2022-06-29 LAB — MAGNESIUM: Magnesium: 1.6 mg/dL — ABNORMAL LOW (ref 1.7–2.4)

## 2022-06-29 LAB — GLUCOSE, CAPILLARY
Glucose-Capillary: 190 mg/dL — ABNORMAL HIGH (ref 70–99)
Glucose-Capillary: 118 mg/dL — ABNORMAL HIGH (ref 70–99)
Glucose-Capillary: 179 mg/dL — ABNORMAL HIGH (ref 70–99)
Glucose-Capillary: 233 mg/dL — ABNORMAL HIGH (ref 70–99)
Glucose-Capillary: 149 mg/dL — ABNORMAL HIGH (ref 70–99)
Glucose-Capillary: 165 mg/dL — ABNORMAL HIGH (ref 70–99)
Glucose-Capillary: 184 mg/dL — ABNORMAL HIGH (ref 70–99)

## 2022-06-29 LAB — HEPATIC FUNCTION PANEL
ALT: 392 U/L — ABNORMAL HIGH (ref 0–44)
AST: 213 U/L — ABNORMAL HIGH (ref 15–41)
Albumin: 2.6 g/dL — ABNORMAL LOW (ref 3.5–5.0)
Alkaline Phosphatase: 310 U/L — ABNORMAL HIGH (ref 38–126)
Bilirubin, Direct: 0.3 mg/dL — ABNORMAL HIGH (ref 0.0–0.2)
Indirect Bilirubin: 0.7 mg/dL (ref 0.3–0.9)
Total Bilirubin: 1 mg/dL (ref 0.3–1.2)
Total Protein: 6 g/dL — ABNORMAL LOW (ref 6.5–8.1)

## 2022-06-29 LAB — SURGICAL PATHOLOGY

## 2022-06-29 MED ORDER — MAGNESIUM OXIDE -MG SUPPLEMENT 400 (240 MG) MG PO TABS
400.0000 mg | ORAL_TABLET | Freq: Two times a day (BID) | ORAL | Status: DC
  Administered 2022-06-29 – 2022-07-01 (×5): 400 mg via ORAL
  Filled 2022-06-29 (×5): qty 1

## 2022-06-29 NOTE — Progress Notes (Addendum)
PROGRESS NOTE    [name]  ZOX:[id number] DOB: [birth year] DOA: 06/28/2022 PCP: Alba Cory, MD    Brief Narrative:   Patient is a 82 year old female with past medical history of history of DM 2, dementia, HTN, CKD stage III, left breast cancer, sp mastectomy 05/15/22 who was recently admitted to [facility] for cholecystitis without cholelithiasis and cholecystectomy was done on 06/25/22.  Postoperatively, patient had elevated bilirubin and LFTs so patient was transferred to [facility] for possible ERCP.   Assessment and Plan.  Abnormal LFTs/jaundice Patient was transferred from [facility].  GI on board.   MRI of the abdomen done 06/27/2022 showed limited study with no definite choledocholithiasis.  LFT elevated but downtrending.  GI plans to follow-up with no definitive plan for ERCP today.  Has been started on oral diet.  GI to follow in AM to decide regarding further plan..  Trend LFTs in AM.     Latest Ref Rng & Units 06/29/2022    3:45 AM 06/28/2022    3:13 AM 06/27/2022    5:46 AM  Hepatic Function  Total Protein 6.5 - 8.1 g/dL 6.0  6.3  6.8   Albumin 3.5 - 5.0 g/dL 2.6  2.9  3.2   AST 15 - 41 U/L 213  364  626   ALT 0 - 44 U/L 392  544  672   Alk Phosphatase 38 - 126 U/L 310  356  372   Total Bilirubin 0.3 - 1.2 mg/dL 1.0  2.6  2.5   Bilirubin, Direct 0.0 - 0.2 mg/dL 0.3   1.3      Breast cancer, s/p 05/15/22 left unilateral modified radical mastectomy Letrozole on hold due to elevated LFTs.   Dementia Continue Aricept   Hyperlipidemia Hold statins.    Hypertension Continue Hyzaar, Norvasc.  Patient appears to be slightly better this morning.   Diabetes mellitus type 2 with hyperglycemia.. -Continue sliding scale insulin.  Closely monitor blood glucose levels.  Mild hypomagnesemia.  Continue magnesium oxide.     DVT prophylaxis: Place and maintain sequential compression device Start: 06/28/22 0810 heparin injection 5,000 Units Start:  06/28/22 0615   Code Status:     Code Status: Full Code  Disposition: Home likely in 1 to 2 days, pending decision on ERCP,  Status is: Inpatient  Remains inpatient appropriate because: Need for possible ERCP follow-up LFTs,.   Family Communication: Spoke with the patient's daughter at bedside.  Consultants:  General surgery  Procedures:  None  Antimicrobials:  None  Anti-infectives (From admission, onward)    None       Subjective: Today, patient was seen and examined at bedside.  Complains of left sided pain especially on movement.  Denies any nausea, vomiting, fever, chills or rigor  Objective: Vitals:   06/29/22 0614 06/29/22 0833 06/29/22 1015 06/29/22 1203  BP: (!) 152/67 (!) 169/64 (!) 164/79 136/61  Pulse: 65 69 77 68  Resp: Temp: 97.6 F (36.4 C) 97.6 F (36.4 C) (!) 97.4 F (36.3 C) 98.3 F (36.8 C)  TempSrc: Oral Oral Oral Oral  SpO2: 97% 98% 99% 100%  Weight:        Intake/Output Summary (Last 24 hours) at 06/29/2022 1305 Last data filed at 06/29/2022 0830 Gross per 24 hour  Intake 2009.25 ml  Output 2000 ml  Net 9.25 ml   Filed Weights   06/28/22 0325  Weight: 58.8 kg    Physical Examination: Body mass index is  23.71 kg/m.   General:  Average built, not in obvious distress HENT:   No scleral pallor or icterus noted. Oral mucosa is moist.  Chest:   Diminished breath sounds bilaterally. No crackles or wheezes.  CVS: S1 &S2 heard. No murmur.  Regular rate and rhythm. Abdomen: Soft, nontender, nondistended.  Bowel sounds are heard.   Extremities: No cyanosis, clubbing or edema.  Peripheral pulses are palpable. Psych: Alert, awake and oriented, normal mood CNS:  No cranial nerve deficits.  Power equal in all extremities.   Skin: Warm and dry.  No rashes noted.  Data Reviewed:   CBC: Recent Labs  Lab 06/25/22 0505 06/26/22 0500 06/27/22 0546 06/28/22 0313 06/29/22 0345  WBC 5.4 7.1 8.1 7.3 7.0  NEUTROABS  --   --    --  4.0  --   HGB 11.9* 11.5* 12.0 11.4* 10.9*  HCT 35.0* 33.8* 35.2* 32.6* 31.9*  MCV 87.3 87.8 86.9 86.5 88.1  PLT 201 194 187 162 160    Basic Metabolic Panel: Recent Labs  Lab 06/24/22 0133 06/25/22 0505 06/26/22 0500 06/27/22 0546 06/28/22 0313 06/29/22 0345  NA  --  139 138 138 132* 137  K  --  3.3* 3.8 3.3* 3.7 3.6  CL  --  104 104 103 101 104  CO2  --  GLUCOSE  --  234* 236* 195* 325* 167*  BUN  --  CREATININE  --  0.53 0.83 0.58 0.75 0.66  CALCIUM  --  8.9 9.0 9.4 8.9 8.8*  MG 1.9 1.7 1.7 1.7  --  1.6*  PHOS 4.6 3.0 3.4 2.8  --   --     Liver Function Tests: Recent Labs  Lab 06/25/22 0505 06/26/22 0500 06/27/22 0546 06/28/22 0313 06/29/22 0345  AST 272* 693* 626* 364* 213*  ALT 319* 560* 672* 544* 392*  ALKPHOS 188* 341* 372* 356* 310*  BILITOT 1.0 1.8* 2.5* 2.6* 1.0  PROT 6.7 6.5 6.8 6.3* 6.0*  ALBUMIN 3.1* 3.1* 3.2* 2.9* 2.6*     Radiology Studies: No results found.    LOS: 1 day    Joycelyn Das, MD [facility] Available via Epic secure chat 7am-7pm After these hours, please refer to coverage provider listed on [email or web address] 06/29/2022, 1:05 PM

## 2022-06-29 NOTE — Progress Notes (Addendum)
Daily Progress Note  DOA: 06/28/2022 Hospital Day: 2 Chief Complaint:  elevated liver tests, ? CBD stone   ASSESSMENT   #82 yo female with history of left breast cancer s/p mastectomy on 05/15/2022, dementia, HTN, CKD , cholelithiasis s/p lap chole on 4/18.   #Elevated liver chemistries / cholelithiasis s/p lap chole on 4/18  MRCP was limited exam but no definite choledocholithiasis or findings of biliary tract.   Liver tests have improved overnight.    PLAN   -Bilirubin has normalized so not sure if ERCP still needed. Would not be able to do the procedure anyway today due to lack of availability in Endo. Can eat today and will decide about ERCP for tomorrow.   Addendum: will review am LFTs, right now it is very possible she will not needs MRCP. Just in case, I will make her NPO after MN and stop SQ heparin after MN.    Subjective / New events:   No complaints. Sleepy. Daughter at bedside and tells me patient has abdominal discomfort when she move around.    Objective    Recent Labs    06/27/22 0546 06/28/22 0313 06/29/22 0345  WBC 8.1 7.3 7.0  HGB 12.0 11.4* 10.9*  HCT 35.2* 32.6* 31.9*  PLT 187 162 160   BMET Recent Labs    06/27/22 0546 06/28/22 0313 06/29/22 0345  NA 138 132* 137  K 3.3* 3.7 3.6  CL 103 101 104  CO2 GLUCOSE 195* 325* 167*  BUN CREATININE 0.58 0.75 0.66  CALCIUM 9.4 8.9 8.8*   LFT Recent Labs    06/29/22 0345  PROT 6.0*  ALBUMIN 2.6*  AST 213*  ALT 392*  ALKPHOS 310*  BILITOT 1.0  BILIDIR 0.3*  IBILI 0.7   PT/INR Recent Labs    06/28/22 0313  LABPROT 15.6*  INR 1.3*     Scheduled inpatient medications:   amLODipine  5 mg Oral Daily   donepezil  10 mg Oral QHS   heparin  5,000 Units Subcutaneous Q8H   losartan  100 mg Oral Daily   And   hydrochlorothiazide  12.5 mg Oral Daily   insulin aspart  0-15 Units Subcutaneous Q4H   insulin glargine-yfgn  20 Units Subcutaneous Daily   Continuous  inpatient infusions:   sodium chloride     0.9 % NaCl with KCl 20 mEq / L 75 mL/hr at 06/29/22 0349   PRN inpatient medications: HYDROmorphone (DILAUDID) injection, ondansetron **OR** ondansetron (ZOFRAN) IV, senna-docusate  Vital signs in last 24 hours: Temp:  [97.4 F (36.3 C)-98.9 F (37.2 C)] 97.4 F (36.3 C) (04/22 1015) Pulse Rate:  [65-77] 77 (04/22 1015) Resp:  [16-18] 18 (04/22 1015) BP: (123-169)/(56-79) 164/79 (04/22 1015) SpO2:  [97 %-99 %] 99 % (04/22 1015) Last BM Date : 06/23/22  Intake/Output Summary (Last 24 hours) at 06/29/2022 1125 Last data filed at 06/29/2022 0830 Gross per 24 hour  Intake 2009.25 ml  Output 2000 ml  Net 9.25 ml    Intake/Output from previous day: 04/21 0701 - 04/22 0700 In: 2009.3 [P.O.:300; I.V.:1709.3] Out: 1050 [Urine:1050] Intake/Output this shift: Total I/O In: -  Out: 950 [Urine:950]   Physical Exam:  General: Alert female in NAD Heart:  Regular rate and rhythm.  Pulmonary: Normal respiratory effort Abdomen: Soft, nondistended. Normal bowel sounds. Extremities: No lower extremity edema  Neurologic: Alert and oriented Psych: Pleasant. Cooperative. Insight appears normal.    Principal Problem:  Abnormal LFTs Active Problems:   Hypertension associated with diabetes (HCC)   Mild protein-calorie malnutrition   Malignant neoplasm of areola of left breast in female, estrogen receptor positive   Breast cancer   Dementia     LOS: 1 day   Willette Cluster ,NP 06/29/2022, 11:25 AM

## 2022-06-29 NOTE — Evaluation (Signed)
Physical Therapy Evaluation Patient Details Name: [name] MRN: [id number] DOB: 1940-09-28 Today's Date: 06/29/2022  History of Present Illness  82 yo female presents to [facility] on 4/16 with emesis, R-sided abdominal pain, elevated LFTs. S/p lap chole on 4/18. Transfer from [facility] on 4/20 for possible ERCP. PMH includes arthritis, CKD, hypertension, dementia, diabetes,left breast cancer s/p modified radical mastectomy on 05/15/2022.  Clinical Impression   Pt presents with generalized weakness, abdominal discomfort with mobility, impaired balance ,and decreased activity tolerance vs baseline. Pt to benefit from acute PT to address deficits. Pt ambulated short room distance to/from [facility], pt limited in tolerance by pain and fatigue. Pt incontinent of urine, found soiled in brief so PT assisted in clean up of pt and bed. Per RN purewicks are no longer used on the unit, PT recommending frequent toileting opportunities for pt and frequent bed checks given pt is incontinent at baseline. PT to progress mobility as tolerated, and will continue to follow acutely.         Recommendations for follow up therapy are one component of a multi-disciplinary discharge planning process, led by the attending physician.  Recommendations may be updated based on patient status, additional functional criteria and insurance authorization.  Follow Up Recommendations       Assistance Recommended at Discharge Frequent or constant Supervision/Assistance  Patient can return home with the following  A little help with walking and/or transfers;A little help with bathing/dressing/bathroom    Equipment Recommendations Other (comment) (tbd pending pt progress)  Recommendations for Other Services  OT consult    Functional Status Assessment       Precautions / Restrictions Precautions Precautions: Fall;Other (comment) Precaution Comments: abd Restrictions Weight Bearing Restrictions: No      Mobility  Bed  Mobility Overal bed mobility: Needs Assistance Bed Mobility: Supine to Sit, Sit to Supine     Supine to sit: Mod assist Sit to supine: Mod assist   General bed mobility comments: assist for trunk and LE management, modified log roll technique utilized given recent abdominal surgery    Transfers Overall transfer level: Needs assistance Equipment used: 2 person hand held assist Transfers: Sit to/from Stand Sit to Stand: Mod assist, +2 physical assistance           General transfer comment: mod +2 for rise, steady. Cues for hand placement when rising and sitting, stand x2 from EOB and BSC.    Ambulation/Gait Ambulation/Gait assistance: Min assist, +2 safety/equipment Gait Distance (Feet): 5 Feet (x2 - to and from [facility]) Assistive device: 2 person hand held assist Gait Pattern/deviations: Step-through pattern, Decreased stride length, Trunk flexed Gait velocity: decr     General Gait Details: assist to steady and support trunk via HHA, cues for upright posture and room navigation  Stairs            Wheelchair Mobility    Modified Rankin (Stroke Patients Only)       Balance Overall balance assessment: Needs assistance Sitting-balance support: No upper extremity supported, Feet supported Sitting balance-Leahy Scale: Fair     Standing balance support: Bilateral upper extremity supported, During functional activity Standing balance-Leahy Scale: Poor                               Pertinent Vitals/Pain Pain Assessment Pain Assessment: Faces Faces Pain Scale: Hurts little more Pain Location: abdomen, during mobility Pain Descriptors / Indicators: Sore, Discomfort Pain Intervention(s): Limited activity within patient's tolerance,  Monitored during session, Repositioned    Home Living Family/patient expects to be discharged to:: Private residence Living Arrangements: Children;Spouse/significant other (husband, son, and son's child) Available Help at  Discharge: Family Type of Home: House Home Access: Stairs to enter       Home Layout: Two level;Able to live on main level with bedroom/bathroom Home Equipment: None Additional Comments: pt uses HHA from family for mobility    Prior Function Prior Level of Function : Needs assist             Mobility Comments: pt receives HHA during gait, transfers ADLs Comments: pt's family assists with toileting, dressing, bathing, cooking, cleaning. Pt receiving HH services after mastectomy, still active with HH     Hand Dominance   Dominant Hand: Right    Extremity/Trunk Assessment   Upper Extremity Assessment Upper Extremity Assessment: Defer to OT evaluation    Lower Extremity Assessment Lower Extremity Assessment: Generalized weakness    Cervical / Trunk Assessment Cervical / Trunk Assessment: Normal  Communication   Communication: No difficulties  Cognition Arousal/Alertness: Awake/alert Behavior During Therapy: WFL for tasks assessed/performed, Flat affect Overall Cognitive Status: History of cognitive impairments - at baseline                                 General Comments: history of dementia, oriented to self only. Pleasant, cooperative, follows one-step commands well        General Comments      Exercises     Assessment/Plan    PT Assessment Patient needs continued PT services  PT Problem List Decreased strength;Decreased mobility;Decreased activity tolerance;Decreased balance;Decreased knowledge of use of DME;Pain;Decreased skin integrity;Decreased safety awareness       PT Treatment Interventions DME instruction;Therapeutic activities;Gait training;Therapeutic exercise;Patient/family education;Balance training;Stair training;Neuromuscular re-education;Functional mobility training    PT Goals (Current goals can be found in the Care Plan section)  Acute Rehab PT Goals PT Goal Formulation: With patient Time For Goal Achievement:  07/13/22 Potential to Achieve Goals: Good    Frequency Min 3X/week     Co-evaluation               AM-PAC PT "6 Clicks" Mobility  Outcome Measure Help needed turning from your back to your side while in a flat bed without using bedrails?: A Little Help needed moving from lying on your back to sitting on the side of a flat bed without using bedrails?: A Lot Help needed moving to and from a bed to a chair (including a wheelchair)?: A Lot Help needed standing up from a chair using your arms (e.g., wheelchair or bedside chair)?: A Little Help needed to walk in hospital room?: A Little Help needed climbing 3-5 steps with a railing? : A Lot 6 Click Score: 15    End of Session   Activity Tolerance: Patient tolerated treatment well;Patient limited by fatigue Patient left: in bed;with call bell/phone within reach;with bed alarm set;with family/visitor present Nurse Communication: Mobility status PT Visit Diagnosis: Other abnormalities of gait and mobility (R26.89);Muscle weakness (generalized) (M62.81)    Time: 1610-9604 PT Time Calculation (min) (ACUTE ONLY): 28 min   Charges:   PT Evaluation $PT Eval Low Complexity: 1 Low PT Treatments $Therapeutic Activity: 8-22 mins        Marye Round, PT DPT Acute Rehabilitation Services Secure Chat Preferred  Office [phone]   Truddie Coco 06/29/2022, 4:39 PM

## 2022-06-30 ENCOUNTER — Encounter (HOSPITAL_COMMUNITY): Payer: Self-pay | Admitting: Anesthesiology

## 2022-06-30 ENCOUNTER — Encounter (HOSPITAL_COMMUNITY)
Admission: AD | Disposition: A | Discharge status: Home Health | Payer: Self-pay | Source: Other Acute Inpatient Hospital | Attending: Internal Medicine

## 2022-06-30 DIAGNOSIS — K5900 Constipation, unspecified: Secondary | ICD-10-CM | POA: Insufficient documentation

## 2022-06-30 DIAGNOSIS — F0390 Unspecified dementia without behavioral disturbance: Secondary | ICD-10-CM | POA: Diagnosis not present

## 2022-06-30 DIAGNOSIS — E1159 Type 2 diabetes mellitus with other circulatory complications: Secondary | ICD-10-CM | POA: Diagnosis not present

## 2022-06-30 DIAGNOSIS — R7989 Other specified abnormal findings of blood chemistry: Secondary | ICD-10-CM | POA: Diagnosis not present

## 2022-06-30 DIAGNOSIS — C50919 Malignant neoplasm of unspecified site of unspecified female breast: Secondary | ICD-10-CM | POA: Diagnosis not present

## 2022-06-30 LAB — CBC
HCT: 31.3 % — ABNORMAL LOW (ref 36.0–46.0)
Hemoglobin: 10.7 g/dL — ABNORMAL LOW (ref 12.0–15.0)
MCH: 29.9 pg (ref 26.0–34.0)
MCHC: 34.2 g/dL (ref 30.0–36.0)
MCV: 87.4 fL (ref 80.0–100.0)
Platelets: 165 10*3/uL (ref 150–400)
RBC: 3.58 MIL/uL — ABNORMAL LOW (ref 3.87–5.11)
RDW: 14.3 % (ref 11.5–15.5)
WBC: 6.4 10*3/uL (ref 4.0–10.5)
nRBC: 0 % (ref 0.0–0.2)

## 2022-06-30 LAB — COMPREHENSIVE METABOLIC PANEL
ALT: 317 U/L — ABNORMAL HIGH (ref 0–44)
AST: 175 U/L — ABNORMAL HIGH (ref 15–41)
Albumin: 2.6 g/dL — ABNORMAL LOW (ref 3.5–5.0)
Alkaline Phosphatase: 267 U/L — ABNORMAL HIGH (ref 38–126)
Anion gap: 11 (ref 5–15)
BUN: 13 mg/dL (ref 8–23)
CO2: 23 mmol/L (ref 22–32)
Calcium: 8.4 mg/dL — ABNORMAL LOW (ref 8.9–10.3)
Chloride: 101 mmol/L (ref 98–111)
Creatinine, Ser: 0.69 mg/dL (ref 0.44–1.00)
GFR, Estimated: >60 mL/min (ref >60)
Glucose, Bld: 225 mg/dL — ABNORMAL HIGH (ref 70–99)
Potassium: 3 mmol/L — ABNORMAL LOW (ref 3.5–5.1)
Sodium: 135 mmol/L (ref 135–145)
Total Bilirubin: 0.8 mg/dL (ref 0.3–1.2)
Total Protein: 6 g/dL — ABNORMAL LOW (ref 6.5–8.1)

## 2022-06-30 LAB — GLUCOSE, CAPILLARY
Glucose-Capillary: 122 mg/dL — ABNORMAL HIGH (ref 70–99)
Glucose-Capillary: 47 mg/dL — ABNORMAL LOW (ref 70–99)
Glucose-Capillary: 72 mg/dL (ref 70–99)
Glucose-Capillary: 205 mg/dL — ABNORMAL HIGH (ref 70–99)
Glucose-Capillary: 157 mg/dL — ABNORMAL HIGH (ref 70–99)
Glucose-Capillary: 179 mg/dL — ABNORMAL HIGH (ref 70–99)
Glucose-Capillary: 369 mg/dL — ABNORMAL HIGH (ref 70–99)
Glucose-Capillary: 207 mg/dL — ABNORMAL HIGH (ref 70–99)

## 2022-06-30 LAB — MAGNESIUM: Magnesium: 1.6 mg/dL — ABNORMAL LOW (ref 1.7–2.4)

## 2022-06-30 SURGERY — CANCELLED PROCEDURE

## 2022-06-30 MED ORDER — BISACODYL 10 MG RE SUPP
10.0000 mg | Freq: Once | RECTAL | Status: AC
  Administered 2022-06-30: 10 mg via RECTAL
  Filled 2022-06-30: qty 1

## 2022-06-30 MED ORDER — OYSTER SHELL CALCIUM/D3 500-5 MG-MCG PO TABS
1.0000 | ORAL_TABLET | Freq: Two times a day (BID) | ORAL | Status: DC
  Administered 2022-06-30 – 2022-07-01 (×3): 1 via ORAL
  Filled 2022-06-30 (×4): qty 1

## 2022-06-30 MED ORDER — INSULIN GLARGINE-YFGN 100 UNIT/ML ~~LOC~~ SOLN
5.0000 [IU] | Freq: Every day | SUBCUTANEOUS | Status: DC
  Administered 2022-06-30 – 2022-07-01 (×2): 5 [IU] via SUBCUTANEOUS
  Filled 2022-06-30 (×3): qty 0.05

## 2022-06-30 MED ORDER — MAGNESIUM SULFATE 2 GM/50ML IV SOLN
2.0000 g | Freq: Once | INTRAVENOUS | Status: AC
  Administered 2022-06-30: 2 g via INTRAVENOUS
  Filled 2022-06-30: qty 50

## 2022-06-30 MED ORDER — TRAMADOL HCL 50 MG PO TABS: 50.0000 mg | ORAL_TABLET | Freq: Two times a day (BID) | ORAL | Status: DC | PRN

## 2022-06-30 MED ORDER — POLYETHYLENE GLYCOL 3350 17 G PO PACK
17.0000 g | PACK | Freq: Every day | ORAL | Status: DC
  Administered 2022-06-30 – 2022-07-01 (×2): 17 g via ORAL
  Filled 2022-06-30 (×2): qty 1

## 2022-06-30 MED ORDER — POTASSIUM CHLORIDE CRYS ER 20 MEQ PO TBCR
40.0000 meq | EXTENDED_RELEASE_TABLET | Freq: Two times a day (BID) | ORAL | Status: AC
  Administered 2022-06-30 (×2): 40 meq via ORAL
  Filled 2022-06-30 (×2): qty 2

## 2022-06-30 MED ORDER — BISACODYL 5 MG PO TBEC
10.0000 mg | DELAYED_RELEASE_TABLET | Freq: Once | ORAL | Status: AC
  Administered 2022-06-30: 10 mg via ORAL
  Filled 2022-06-30: qty 2

## 2022-06-30 MED ORDER — TRAMADOL HCL 50 MG PO TABS
50.0000 mg | ORAL_TABLET | Freq: Four times a day (QID) | ORAL | Status: DC | PRN
  Administered 2022-06-30: 50 mg via ORAL
  Filled 2022-06-30: qty 1

## 2022-06-30 NOTE — Progress Notes (Signed)
Hypoglycemic Event  CBG: 47  Treatment: 8 oz juice/soda  Symptoms: None  Follow-up CBG: Time:0252 CBG Result:72  Possible Reasons for Event: Unknown  Comments/MD notified:MD Notified (Carole Margo Aye)    Elmore Guise

## 2022-06-30 NOTE — Progress Notes (Signed)
PROGRESS NOTE    [name]  UJW:[id number] DOB: 07-14-1940 DOA: 06/28/2022 PCP: Alba Cory, MD    Brief Narrative:   Patient is a 82 year old female with past medical history of history of DM 2, dementia, HTN, CKD stage III, left breast cancer, sp mastectomy 05/15/22 who was recently admitted to  [facility] for cholecystitis without cholelithiasis and cholecystectomy was done on 06/25/22.  Postoperatively, patient had elevated bilirubin and LFTs so patient was transferred to [facility] for possible ERCP. After admission, patient has been followed by GI with LFT monitoring.  LFTs gradually improving so no plans for ERCP at this time.   Assessment and Plan.  Abnormal LFTs/jaundice  MRI of the abdomen done 06/27/2022 showed limited study with no definite choledocholithiasis.  LFT elevated but downtrending.  GI plans to follow-up with no definitive plan for ERCP.  Has been started on oral diet. Trend LFTs in AM.     Latest Ref Rng & Units 06/30/2022    4:09 AM 06/29/2022    3:45 AM 06/28/2022    3:13 AM  Hepatic Function  Total Protein 6.5 - 8.1 g/dL 6.0  6.0  6.3   Albumin 3.5 - 5.0 g/dL 2.6  2.6  2.9   AST 15 - 41 U/L 175  213  364   ALT 0 - 44 U/L 317  392  544   Alk Phosphatase 38 - 126 U/L 267  310  356   Total Bilirubin 0.3 - 1.2 mg/dL 0.8  1.0  2.6   Bilirubin, Direct 0.0 - 0.2 mg/dL  0.3      Hypokalemia.  Will replace.  Potassium of 3.0 today.  Check levels in AM.  Magnesium level of 1.6.  Will give 2 g of magnesium sulfate as well.  Already on magnesium oxide.  Breast cancer, s/p 05/15/22 left unilateral modified radical mastectomy Letrozole on hold due to elevated LFTs.   Dementia Continue Aricept   Hyperlipidemia Hold statins.    Hypertension Continue Hyzaar, Norvasc.  Patient appears to be slightly better this morning.   Diabetes mellitus type 2 with hyperglycemia.. -Continue sliding scale insulin.  Closely monitor blood glucose levels.  Mild  hypomagnesemia.  Continue magnesium oxide.  Give 1 dose of magnesium sulfate  Constipation.  Likely causing abdominal discomfort..  Last bowel movement 8 days back.  GI has recommended to Dulcolax followed by suppository and daily MiraLAX.    DVT prophylaxis: Place and maintain sequential compression device Start: 06/28/22 0810   Code Status:     Code Status: Full Code  Disposition:  Home likely on 07/01/2022 continues to improve and okay with GI  Status is: Inpatient  Remains inpatient appropriate because: LFT monitoring,   Family Communication: Spoke with the patient's daughter at bedside on 07/01/2022.  Consultants:  Gastroenterology  Procedures:  None  Antimicrobials:  None  Anti-infectives (From admission, onward)    None       Subjective: Today, patient was seen and examined at bedside.  Patient complains of left sided lower abdominal pain.  Family at bedside.  Complains of pain mostly of movement.  Denies any nausea vomiting fever chills or rigors.  Patient has had constipation for the last 7 days.  Objective: Vitals:   06/29/22 2105 06/30/22 0238 06/30/22 0501 06/30/22 0823  BP: (!) 132/59 (!) 132/53 (!) 150/57 133/65  Pulse: 69 64 70 62  Resp: Temp: 98.9 F (37.2 C)  98.2 F (36.8 C)   TempSrc:  SpO2: 98% 99% 98% 98%  Weight:       No intake or output data in the 24 hours ending 06/30/22 1318  Filed Weights   06/28/22 0325  Weight: 58.8 kg    Physical Examination: Body mass index is 23.71 kg/m.   General:  Average built, not in obvious distress slightly febrile, Communicative, HENT:   No scleral pallor or icterus noted. Oral mucosa is moist.  Chest:   Diminished breath sounds bilaterally. No crackles or wheezes.  CVS: S1 &S2 heard. No murmur.  Regular rate and rhythm. Abdomen: Soft, , tenderness over the left lower quadrant, bowel sounds are heard.  Incision site looks healthy. Extremities: No cyanosis, clubbing or edema.   Peripheral pulses are palpable. Psych: Alert, awake and oriented, normal mood CNS:  No cranial nerve deficits.  Power equal in all extremities.   Skin: Warm and dry.  No rashes noted.  Data Reviewed:   CBC: Recent Labs  Lab 06/26/22 0500 06/27/22 0546 06/28/22 0313 06/29/22 0345 06/30/22 0409  WBC 7.1 8.1 7.3 7.0 6.4  NEUTROABS  --   --  4.0  --   --   HGB 11.5* 12.0 11.4* 10.9* 10.7*  HCT 33.8* 35.2* 32.6* 31.9* 31.3*  MCV 87.8 86.9 86.5 88.1 87.4  PLT 194 187 162 160 165    Basic Metabolic Panel: Recent Labs  Lab 06/24/22 0133 06/25/22 0505 06/26/22 0500 06/27/22 0546 06/28/22 0313 06/29/22 0345 06/30/22 0409  NA  --  139 138 138 132* 137 135  K  --  3.3* 3.8 3.3* 3.7 3.6 3.0*  CL  --  104 104 103 101 104 101  CO2  --  GLUCOSE  --  234* 236* 195* 325* 167* 225*  BUN  --  CREATININE  --  0.53 0.83 0.58 0.75 0.66 0.69  CALCIUM  --  8.9 9.0 9.4 8.9 8.8* 8.4*  MG 1.9 1.7 1.7 1.7  --  1.6* 1.6*  PHOS 4.6 3.0 3.4 2.8  --   --   --     Liver Function Tests: Recent Labs  Lab 06/26/22 0500 06/27/22 0546 06/28/22 0313 06/29/22 0345 06/30/22 0409  AST 693* 626* 364* 213* 175*  ALT 560* 672* 544* 392* 317*  ALKPHOS 341* 372* 356* 310* 267*  BILITOT 1.8* 2.5* 2.6* 1.0 0.8  PROT 6.5 6.8 6.3* 6.0* 6.0*  ALBUMIN 3.1* 3.2* 2.9* 2.6* 2.6*     Radiology Studies: No results found.    LOS: 2 days    Joycelyn Das, MD [facility] Available via Epic secure chat 7am-7pm After these hours, please refer to coverage provider listed on [email or web address] 06/30/2022, 1:18 PM

## 2022-06-30 NOTE — Progress Notes (Signed)
Daily Progress Note  DOA: 06/28/2022 Hospital Day: 3 Chief Complaint: Abnormal liver chemistries   ASSESSMENT   #82 yo female with history of left breast cancer s/p mastectomy on 05/15/2022, dementia, HTN, CKD    #Elevated liver chemistries / cholelithiasis s/p lap chole on 4/18  MRCP was limited exam but no definite choledocholithiasis or findings of biliary tract obstruction.   -Liver tests continue to improve.   # LLQ pain associated with movement / position. LLQ incision site looks okay. Need to rule out constipation as contributing factor.   # Constipation.  Last BM was 8 days ago.    PLAN  -No plans for ERCP given continued improvement in liver tests / no CBD dilation on MRCP.  -Tolerating diet .  -Two dulcolax tabs now, Dulcolax supp this afternoon and daily Miralax    Subjective / New events:   Daughter in room. Patient has been having LLQ pain since admission. Pain mainly related to movement / position. Incision cite in LLQ looks okay. Last BM was 8 days ago   Objective     Recent Labs    06/28/22 0313 06/29/22 0345 06/30/22 0409  WBC 7.3 7.0 6.4  HGB 11.4* 10.9* 10.7*  HCT 32.6* 31.9* 31.3*  PLT 162 160 165   BMET Recent Labs    06/28/22 0313 06/29/22 0345 06/30/22 0409  NA 132* 137 135  K 3.7 3.6 3.0*  CL 101 104 101  CO2 GLUCOSE 325* 167* 225*  BUN CREATININE 0.75 0.66 0.69  CALCIUM 8.9 8.8* 8.4*   LFT Recent Labs    06/29/22 0345 06/30/22 0409  PROT 6.0* 6.0*  ALBUMIN 2.6* 2.6*  AST 213* 175*  ALT 392* 317*  ALKPHOS 310* 267*  BILITOT 1.0 0.8  BILIDIR 0.3*  --   IBILI 0.7  --    PT/INR Recent Labs    06/28/22 0313  LABPROT 15.6*  INR 1.3*     Scheduled inpatient medications:   amLODipine  5 mg Oral Daily   donepezil  10 mg Oral QHS   losartan  100 mg Oral Daily   And   hydrochlorothiazide  12.5 mg Oral Daily   insulin aspart  0-15 Units Subcutaneous Q4H   insulin glargine-yfgn  5 Units  Subcutaneous Daily   magnesium oxide  400 mg Oral BID   potassium chloride  40 mEq Oral BID   Continuous inpatient infusions:   sodium chloride     PRN inpatient medications: HYDROmorphone (DILAUDID) injection, ondansetron **OR** ondansetron (ZOFRAN) IV, senna-docusate  Vital signs in last 24 hours: Temp:  [97.4 F (36.3 C)-98.9 F (37.2 C)] 98.2 F (36.8 C) (04/23 0501) Pulse Rate:  [62-78] 62 (04/23 0823) Resp:  [17-18] 17 (04/23 0501) BP: (123-164)/(53-79) 133/65 (04/23 0823) SpO2:  [96 %-100 %] 98 % (04/23 0823) Last BM Date : 06/23/22 No intake or output data in the 24 hours ending 06/30/22 0850  Intake/Output from previous day: 04/22 0701 - 04/23 0700 In: -  Out: 950 [Urine:950] Intake/Output this shift: No intake/output data recorded.   Physical Exam:  General: Alert female in NAD Heart:  Regular rate and rhythm.  Pulmonary: Normal respiratory effort Abdomen: Soft, mildly distended, mild -mod LLQ tenderness.  Normal bowel sounds. Extremities: No lower extremity edema  Psych: Pleasant. Cooperative.   Principal Problem:   Abnormal LFTs Active Problems:   Hypertension associated with diabetes (HCC)   Mild protein-calorie malnutrition   Malignant neoplasm  of areola of left breast in female, estrogen receptor positive   Breast cancer   Dementia     LOS: 2 days   Willette Cluster ,NP 06/30/2022, 8:50 AM

## 2022-07-01 ENCOUNTER — Telehealth: Payer: Self-pay | Admitting: Family Medicine

## 2022-07-01 DIAGNOSIS — R7989 Other specified abnormal findings of blood chemistry: Secondary | ICD-10-CM | POA: Diagnosis not present

## 2022-07-01 LAB — CBC
HCT: 31.3 % — ABNORMAL LOW (ref 36.0–46.0)
Hemoglobin: 10.8 g/dL — ABNORMAL LOW (ref 12.0–15.0)
MCH: 31 pg (ref 26.0–34.0)
MCHC: 34.5 g/dL (ref 30.0–36.0)
MCV: 89.9 fL (ref 80.0–100.0)
Platelets: 163 10*3/uL (ref 150–400)
RBC: 3.48 MIL/uL — ABNORMAL LOW (ref 3.87–5.11)
RDW: 14.6 % (ref 11.5–15.5)
WBC: 6.8 10*3/uL (ref 4.0–10.5)
nRBC: 0 % (ref 0.0–0.2)

## 2022-07-01 LAB — COMPREHENSIVE METABOLIC PANEL
ALT: 294 U/L — ABNORMAL HIGH (ref 0–44)
AST: 168 U/L — ABNORMAL HIGH (ref 15–41)
Albumin: 2.8 g/dL — ABNORMAL LOW (ref 3.5–5.0)
Alkaline Phosphatase: 248 U/L — ABNORMAL HIGH (ref 38–126)
Anion gap: 8 (ref 5–15)
BUN: 18 mg/dL (ref 8–23)
CO2: 24 mmol/L (ref 22–32)
Calcium: 9.1 mg/dL (ref 8.9–10.3)
Chloride: 102 mmol/L (ref 98–111)
Creatinine, Ser: 0.67 mg/dL (ref 0.44–1.00)
GFR, Estimated: >60 mL/min (ref >60)
Glucose, Bld: 183 mg/dL — ABNORMAL HIGH (ref 70–99)
Potassium: 4.7 mmol/L (ref 3.5–5.1)
Sodium: 134 mmol/L — ABNORMAL LOW (ref 135–145)
Total Bilirubin: 0.6 mg/dL (ref 0.3–1.2)
Total Protein: 6.2 g/dL — ABNORMAL LOW (ref 6.5–8.1)

## 2022-07-01 LAB — GLUCOSE, CAPILLARY
Glucose-Capillary: 158 mg/dL — ABNORMAL HIGH (ref 70–99)
Glucose-Capillary: 170 mg/dL — ABNORMAL HIGH (ref 70–99)
Glucose-Capillary: 193 mg/dL — ABNORMAL HIGH (ref 70–99)
Glucose-Capillary: 199 mg/dL — ABNORMAL HIGH (ref 70–99)
Glucose-Capillary: 249 mg/dL — ABNORMAL HIGH (ref 70–99)
Glucose-Capillary: 256 mg/dL — ABNORMAL HIGH (ref 70–99)

## 2022-07-01 LAB — MAGNESIUM: Magnesium: 2.2 mg/dL (ref 1.7–2.4)

## 2022-07-01 MED ORDER — POLYETHYLENE GLYCOL 3350 17 G PO PACK: 17.0000 g | PACK | Freq: Every day | ORAL | 0 refills | Status: AC

## 2022-07-01 MED ORDER — INSULIN ASPART 100 UNIT/ML IJ SOLN
0.0000 [IU] | Freq: Three times a day (TID) | INTRAMUSCULAR | Status: DC
  Administered 2022-07-01: 8 [IU] via SUBCUTANEOUS

## 2022-07-01 MED ORDER — INSULIN GLARGINE-YFGN 100 UNIT/ML ~~LOC~~ SOLN: 5.0000 [IU] | Freq: Every day | SUBCUTANEOUS | 11 refills | Status: DC

## 2022-07-01 NOTE — Care Management Important Message (Signed)
Important Message  Patient Details  Name: [name] MRN: [id number] Date of Birth: June 18, 1940   Medicare Important Message Given:  Yes     Jamarie Mussa Stefan Church 07/01/2022, 3:11 PM

## 2022-07-01 NOTE — Telephone Encounter (Signed)
Copied from CRM [id number]. Topic: Appointment Scheduling - Scheduling Inquiry for Clinic >> Jul 01, 2022  1:27 PM Franchot Heidelberg wrote: Reason for CRM: Pt called reporting that she already had her Medicare AWV 05/29/2022 with Albertine Grates. It was scheduled as a mychart video visit instead of a medicare annual wellness visit even though it is accounted for in the system as an AWV past encounter.

## 2022-07-01 NOTE — Progress Notes (Signed)
Physical Therapy Treatment Patient Details Name: [name] No MRN: [id number] DOB: [birth year] Today's Date: 07/01/2022   History of Present Illness 82 yo female presents to [facility] on 4/16 with emesis, R-sided abdominal pain, elevated LFTs. S/p lap chole on 4/18. Transfer from [facility] on 4/20 for possible ERCP. PMH includes arthritis, CKD, hypertension, dementia, diabetes,left breast cancer s/p modified radical mastectomy on 05/25/2022.    PT Comments    Educated provided to dtr, [employer], on how to assist pt with use of RW and gait belt for improved safety with ambulation. Dtr appreciative. Dtr given gait belt for home and demo'd good use. Recommend youth RW as pt is 5' tall and youth size would be optimal support/kinematics. Acute PT to cont to follow.    Recommendations for follow up therapy are one component of a multi-disciplinary discharge planning process, led by the attending physician.  Recommendations may be updated based on patient status, additional functional criteria and insurance authorization.  Follow Up Recommendations       Assistance Recommended at Discharge Frequent or constant Supervision/Assistance  Patient can return home with the following A little help with walking and/or transfers;A little help with bathing/dressing/bathroom   Equipment Recommendations  Rolling walker (2 wheels) (youth)    Recommendations for Other Services OT consult     Precautions / Restrictions Precautions Precautions: Fall Restrictions Weight Bearing Restrictions: No     Mobility  Bed Mobility               General bed mobility comments: pt up in chair upon PT arrival    Transfers Overall transfer level: Needs assistance Equipment used: Rolling walker (2 wheels) Transfers: Sit to/from Stand Sit to Stand: Mod assist           General transfer comment: tactile cues for anterior translation and to initiate power up but then minA to complete full stand, verbal cues  to grab walker    Ambulation/Gait Ambulation/Gait assistance: Min assist Gait Distance (Feet): 100 Feet Assistive device: Rolling walker (2 wheels) Gait Pattern/deviations: Step-through pattern, Decreased stride length, Trunk flexed Gait velocity: dec Gait velocity interpretation: <1.31 ft/sec, indicative of household ambulator   General Gait Details: incresaed trunk flexion, minA for walker management to keep forward momentum with RW. Dtr, Dawn, present and return demonstrated safe use of gait belt and how to assist pt with RW. Dtr reports this to be much more stable than ambulating with mother with HHA   Stairs             Wheelchair Mobility    Modified Rankin (Stroke Patients Only)       Balance Overall balance assessment: Needs assistance Sitting-balance support: No upper extremity supported, Feet supported Sitting balance-Leahy Scale: Fair     Standing balance support: Bilateral upper extremity supported, During functional activity Standing balance-Leahy Scale: Poor                              Cognition Arousal/Alertness: Awake/alert Behavior During Therapy: WFL for tasks assessed/performed, Flat affect Overall Cognitive Status: History of cognitive impairments - at baseline                                 General Comments: history of dementia, oriented to self only. Pleasant, cooperative, follows one-step commands well, quiet        Exercises      General Comments  General comments (skin integrity, edema, etc.): VSS      Pertinent Vitals/Pain Pain Assessment Pain Assessment: No/denies pain    Home Living                          Prior Function            PT Goals (current goals can now be found in the care plan section) Acute Rehab PT Goals PT Goal Formulation: With patient Time For Goal Achievement: 07/13/22 Potential to Achieve Goals: Good Progress towards PT goals: Progressing toward goals     Frequency    Min 3X/week      PT Plan Current plan remains appropriate    Co-evaluation              AM-PAC PT "6 Clicks" Mobility   Outcome Measure  Help needed turning from your back to your side while in a flat bed without using bedrails?: A Little Help needed moving from lying on your back to sitting on the side of a flat bed without using bedrails?: A Little Help needed moving to and from a bed to a chair (including a wheelchair)?: A Little Help needed standing up from a chair using your arms (e.g., wheelchair or bedside chair)?: A Little Help needed to walk in hospital room?: A Little Help needed climbing 3-5 steps with a railing? : A Lot 6 Click Score: 17    End of Session Equipment Utilized During Treatment: Gait belt Activity Tolerance: Patient tolerated treatment well;Patient limited by fatigue Patient left: in bed;with call bell/phone within reach;with family/visitor present;in chair Nurse Communication: Mobility status PT Visit Diagnosis: Other abnormalities of gait and mobility (R26.89);Muscle weakness (generalized) (M62.81)     Time: 1610-9604 PT Time Calculation (min) (ACUTE ONLY): 24 min  Charges:  $Gait Training: 23-37 mins                     [name], PT, DPT Acute Rehabilitation Services Secure chat preferred Office #: [phone]    [name] 07/01/2022, 1:53 PM

## 2022-07-01 NOTE — Care Management Important Message (Signed)
Important Message  Patient Details  Name: [name] MRN: [id number] Date of Birth: 1940/11/18   Medicare Important Message Given:  Yes     Geremiah Fussell Stefan Church 07/01/2022, 3:13 PM

## 2022-07-01 NOTE — TOC Transition Note (Signed)
Transition of Care [facility]) - CM/SW Discharge Note   Patient Details  Name: [name] MRN: [id number] Date of Birth: 1940/09/22  Transition of Care [facility]) CM/SW Contact:  Janae Bridgeman, RN Phone Number: 07/01/2022, 1:25 PM   Clinical Narrative:    CM met with the patient and daughter at the bedside to discuss TOC needs.  The patient is discharging home with daughter today by car.  The patient was sleeping but daughter was provided with Medicare choice regarding home health and daughter chose [facility].  I called Ephriam Knuckles, CM with [facility] health and he accepted the referral.  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  PT requested Youth RW - I called [employer] and ordered a Youth RW to be delivered to the patient's home - drop shipped.  The patient can be discharged home with daughter by bedside nursing.   Final next level of care: Home w Home Health Services Barriers to Discharge: No Barriers Identified   Patient Goals and CMS Choice CMS Medicare.gov Compare Post Acute Care list provided to:: Patient Represenative (must comment) (daughter is at bedside) Choice offered to / list presented to : Adult Children  Discharge Placement                         Discharge Plan and Services Additional resources added to the After Visit Summary for     Discharge Planning Services: CM Consult Post Acute Care Choice: Durable Medical Equipment, Home Health          DME Arranged: Walker rolling (Youth rolling walker requested from [employer] to deliver to the home) DME Agency: [employer] Date DME Agency Contacted: 07/01/22 Time DME Agency Contacted: 1324 Representative spoke with at DME Agency: Barbara Cower, CM with [employer] HH Arranged: PT, OT HH Agency:  [facility]) Date [facility] Agency Contacted: 07/01/22 Time HH Agency Contacted: 1324 Representative spoke with at [facility] Agency: Ephriam Knuckles, CM with [facility] accepted the  referral  Social Determinants of Health (SDOH) Interventions SDOH Screenings   Food Insecurity: No Food Insecurity (06/28/2022)  Housing: Low Risk  (06/28/2022)  Transportation Needs: No Transportation Needs (06/28/2022)  Utilities: Not At Risk (06/28/2022)  Alcohol Screen: Low Risk  (09/08/2019)  Depression (PHQ2-9): Low Risk  (04/14/2022)  Financial Resource Strain: Low Risk  (05/14/2022)  Physical Activity: Inactive (05/14/2022)  Social Connections: Socially Integrated (05/14/2022)  Stress: No Stress Concern Present (05/14/2022)  Tobacco Use: Low Risk  (06/28/2022)     Readmission Risk Interventions    07/01/2022    1:25 PM  Readmission Risk Prevention Plan  Post Dischage Appt Complete  Medication Screening Complete  Transportation Screening Complete

## 2022-07-01 NOTE — Discharge Summary (Signed)
Physician Discharge Summary  Lacrystal Barbe ZOX:[id number] DOB: 03/30/1940 DOA: 06/28/2022  PCP: Alba Cory, MD  Admit date: 06/28/2022 Discharge date: 07/01/2022  Time spent: 40 minutes  Recommendations for Outpatient Follow-up:  Requires Chem-12, CBC in about 1 week's time Please resume letrozole/Femara as per oncology input New prescription-MiraLAX  Discharge Diagnoses:  MAIN problem for hospitalization \  Transaminitis with elevated bilirubin on admission Recent breast cancer with recent mastectomy Constipation during hospitalization   Please see below for itemized issues addressed in HOpsital- refer to other progress notes for clarity if needed  Discharge Condition: Proved  Diet recommendation: Low-fat low-salt  Filed Weights   06/28/22 0325  Weight: 58.8 kg    History of present illness:  82 year old black female known DM TY 2 HTN CKD 3A Left breast cancer status postmastectomy 05/15/2022 Recently admitted to [facility] 4/17 through 4/21 with cholelithiasis biliary colic and cholecystectomy performed 4/18-she was seen at [facility] and as patient had persistent elevation of her bilirubin, because of lack of GI coverage over the weekend the patient was transferred to [facility] for consideration of potential ERCP  Gastroenterology Dr. Elnoria Howard initially saw the patient in consult and patient was noted to have slightly uptrending LFTs --necessitating possible ERCP Repeat MRCP was done and it seemed degraded by motion Gastroenterology evaluated the patient on 4/22 and monitored her condition By 4/23 her bilirubin normalized and it was not felt that ERCP was required  I had a long discussion with her daughter Francis Dowse is an OR nurse at [employer] [facility]] at the bedside about letrozole possibly causing some mild alteration of LFTs and discussed with her that the patient will need follow-up with oncology and general surgery in the outpatient setting for labs in about a  week Patient had some constipation that was treated successfully and patient was able to continue the meds and was discharged home in the care of her daughter in a stable state for close follow-up  I have discontinued the patient's atorvastatin at this time and this can be once again resumed in the outpatient setting  She was requiring much less insulin this hospitalization than her usual so we cut back the dose of long-acting to 5 units  CC Dr. Hazle Quant general surgery   Discharge Exam: Vitals:   07/01/22 0448 07/01/22 0735  BP: (!) 135/58 (!) 152/68  Pulse: 67 72  Resp: 15 18  Temp: 98 F (36.7 C) 98.1 F (36.7 C)  SpO2: 99% 98%    Subj on day of d/c   Awake coherent little bit forgetful no distress eating breakfast comfortably had 3 stools overnight  General Exam on discharge  EOMI NCAT no focal deficit no icterus no pallor frail slightly forgetful black female bitemporal wasting Chest is clear no rales rhonchi wheeze Abdomen is soft with mild tenderness in left lower quadrant No lower extremity edema Chest is clear no added sound wheeze rales rhonchi Psych euthymic  Discharge Instructions   Discharge Instructions     Diet - low sodium heart healthy   Complete by: As directed    Discharge instructions   Complete by: As directed    You were diagnosed with an elevation of your liver enzymes, which was likelyu secondary to your recent Gall Bladder procedure.  You should follow up with General surgery who did the procedure, but should also probably hold off on taking your hormonal medication (letrozole) for your breast cancer as this can sometimes cause mild elevations in your liver function and you  should discuss this with the oncologist who is going to see her in about 1 week Any high fevers chills nausea vomiting please report immediately to the emergency room Please continue a soft bland diet and do not lift more than a gallon of milk at a time until you are seen  by your surgeon We will prescribe you low doses of a laxative and you can transition this to every other day   Increase activity slowly   Complete by: As directed       Allergies as of 07/01/2022       Reactions   Colesevelam Hcl    scotomas   Daucus Carota    Penicillins Itching        Medication List     STOP taking these medications    atorvastatin 80 MG tablet Commonly known as: LIPITOR   hydrALAZINE 10 MG tablet Commonly known as: APRESOLINE   letrozole 2.5 MG tablet Commonly known as: Cain Sieve   Tresiba FlexTouch 100 UNIT/ML FlexTouch Pen Generic drug: insulin degludec       TAKE these medications    Accu-Chek Softclix Lancets lancets   alendronate 70 MG tablet Commonly known as: FOSAMAX Take with a full glass of water on an empty stomach. What changed:  how much to take when to take this   amLODipine 5 MG tablet Commonly known as: NORVASC Take 1 tablet (5 mg total) by mouth daily.   Baqsimi One Pack 3 MG/DOSE Powd Generic drug: Glucagon   blood glucose meter kit and supplies   Calcium Carb-Cholecalciferol 600-10 MG-MCG Tabs Take 1 tablet by mouth 2 (two) times daily.   [employer] Alcohol Swabs Pads USE 4 TIMES DAILY TO WIPE SKIN BEFORE USING INSULIN   donepezil 10 MG disintegrating tablet Commonly known as: ARICEPT ODT TAKE 1 TABLET BY MOUTH EVERYDAY AT BEDTIME What changed:  how much to take how to take this when to take this   insulin aspart 100 UNIT/ML FlexPen Commonly known as: NOVOLOG Inject 6-10 Units into the skin 3 (three) times daily with meals.   insulin glargine-yfgn 100 UNIT/ML injection Commonly known as: SEMGLEE Inject 0.05 mLs (5 Units total) into the skin daily.   losartan-hydrochlorothiazide 100-12.5 MG tablet Commonly known as: HYZAAR Take 1 tablet by mouth daily.   polyethylene glycol 17 g packet Commonly known as: MIRALAX / GLYCOLAX Take 17 g by mouth daily.   traMADol 50 MG tablet Commonly known as:  Ultram Take 1 tablet (50 mg total) by mouth every 6 (six) hours as needed.       Allergies  Allergen Reactions   Colesevelam Hcl     scotomas   Daucus Carota    Penicillins Itching      The results of significant diagnostics from this hospitalization (including imaging, microbiology, ancillary and laboratory) are listed below for reference.    Significant Diagnostic Studies: MR ABDOMEN MRCP WO CONTRAST  Result Date: 06/27/2022 CLINICAL DATA:  82 year old female status post cholecystectomy presenting with history of jaundice. Suspected retained choledocholithiasis. EXAM: MRI ABDOMEN WITHOUT CONTRAST  (INCLUDING MRCP) TECHNIQUE: Multiplanar multisequence MR imaging of the abdomen was performed. Heavily T2-weighted images of the biliary and pancreatic ducts were obtained, and three-dimensional MRCP images were rendered by post processing. COMPARISON:  Abdominal MRI with MRCP 06/24/2022. FINDINGS: Comment: Today's study is limited for detection of visceral and/or vascular lesions by lack of IV gadolinium. Considerable respiratory motion also limits portions of today's examination. Lower chest: Unremarkable. Hepatobiliary: No suspicious cystic or solid  hepatic lesions are confidently identified on today's noncontrast examination. Status post cholecystectomy. Trace amount of T2 hyperintense fluid in the gallbladder fossa, within normal limits in this postoperative patient. This fluid does not appear to exert mass effect or have organized properties to strongly suggest abscess or definite biloma at this time. MRCP images are severely limited by motion artifact. With these limitations in mind, there is no substantial intrahepatic biliary ductal dilatation. Common bile duct is normal in caliber measuring 4 mm in the porta hepatis. Accurate assessment for choledocholithiasis is not possible on today's motion limited examination, but no definite filling defects are confidently identified in the common bile  duct to clearly indicate the presence of retained ductal stones. Pancreas: No pancreatic mass noted on today's limited noncontrast examination. No pancreatic ductal dilatation. No peripancreatic fluid collections or inflammatory changes. Spleen:  Unremarkable. Adrenals/Urinary Tract: T1 hypointense and T2 hyperintense lesions in the kidneys bilaterally, incompletely characterized on today's noncontrast CT examination, but statistically likely to represent cysts (no imaging follow-up recommended), largest of which measures 1.3 cm in the lateral aspect of the lower pole of the right kidney. No hydroureteronephrosis in the visualized portions of the abdomen. Bilateral adrenal glands are normal in appearance. Stomach/Bowel: Visualized portions are unremarkable. Vascular/Lymphatic: No aneurysm identified in the visualized abdominal vasculature. No definite lymphadenopathy noted in the abdomen. Other: No significant volume of ascites noted in the visualized portions of the peritoneal cavity. Musculoskeletal: Soft tissue edema in the flank regions bilaterally, similar to the prior study. No aggressive appearing osseous lesions are noted in the visualized portions of the skeleton. IMPRESSION: 1. Limited study secondary to patient respiratory motion and lack of IV contrast administration. With these limitations in mind, there is no definite choledocholithiasis or findings of biliary tract obstruction. Small postoperative fluid collection in the gallbladder fossa is within normal limits given the recent cholecystectomy. Electronically Signed   By: Trudie Reed M.D.   On: 06/27/2022 12:30   MR 3D Recon At Scanner  Result Date: 06/27/2022 CLINICAL DATA:  82 year old female status post cholecystectomy presenting with history of jaundice. Suspected retained choledocholithiasis. EXAM: MRI ABDOMEN WITHOUT CONTRAST  (INCLUDING MRCP) TECHNIQUE: Multiplanar multisequence MR imaging of the abdomen was performed. Heavily  T2-weighted images of the biliary and pancreatic ducts were obtained, and three-dimensional MRCP images were rendered by post processing. COMPARISON:  Abdominal MRI with MRCP 06/24/2022. FINDINGS: Comment: Today's study is limited for detection of visceral and/or vascular lesions by lack of IV gadolinium. Considerable respiratory motion also limits portions of today's examination. Lower chest: Unremarkable. Hepatobiliary: No suspicious cystic or solid hepatic lesions are confidently identified on today's noncontrast examination. Status post cholecystectomy. Trace amount of T2 hyperintense fluid in the gallbladder fossa, within normal limits in this postoperative patient. This fluid does not appear to exert mass effect or have organized properties to strongly suggest abscess or definite biloma at this time. MRCP images are severely limited by motion artifact. With these limitations in mind, there is no substantial intrahepatic biliary ductal dilatation. Common bile duct is normal in caliber measuring 4 mm in the porta hepatis. Accurate assessment for choledocholithiasis is not possible on today's motion limited examination, but no definite filling defects are confidently identified in the common bile duct to clearly indicate the presence of retained ductal stones. Pancreas: No pancreatic mass noted on today's limited noncontrast examination. No pancreatic ductal dilatation. No peripancreatic fluid collections or inflammatory changes. Spleen:  Unremarkable. Adrenals/Urinary Tract: T1 hypointense and T2 hyperintense lesions in the kidneys bilaterally,  incompletely characterized on today's noncontrast CT examination, but statistically likely to represent cysts (no imaging follow-up recommended), largest of which measures 1.3 cm in the lateral aspect of the lower pole of the right kidney. No hydroureteronephrosis in the visualized portions of the abdomen. Bilateral adrenal glands are normal in appearance. Stomach/Bowel:  Visualized portions are unremarkable. Vascular/Lymphatic: No aneurysm identified in the visualized abdominal vasculature. No definite lymphadenopathy noted in the abdomen. Other: No significant volume of ascites noted in the visualized portions of the peritoneal cavity. Musculoskeletal: Soft tissue edema in the flank regions bilaterally, similar to the prior study. No aggressive appearing osseous lesions are noted in the visualized portions of the skeleton. IMPRESSION: 1. Limited study secondary to patient respiratory motion and lack of IV contrast administration. With these limitations in mind, there is no definite choledocholithiasis or findings of biliary tract obstruction. Small postoperative fluid collection in the gallbladder fossa is within normal limits given the recent cholecystectomy. Electronically Signed   By: Trudie Reed M.D.   On: 06/27/2022 12:30   MR ABDOMEN MRCP WO CONTRAST  Result Date: 06/24/2022 CLINICAL DATA:  Jaundice and cholelithiasis. History of diabetes and breast cancer. EXAM: MRI ABDOMEN WITHOUT CONTRAST  (INCLUDING MRCP) TECHNIQUE: Multiplanar multisequence MR imaging of the abdomen was performed. Heavily T2-weighted images of the biliary and pancreatic ducts were obtained, and three-dimensional MRCP images were rendered by post processing. COMPARISON:  Abdominal ultrasound 06/23/2022. PET-CT 04/28/2022. Abdominopelvic CT 01/24/2012. FINDINGS: Despite efforts by the technologist and patient, moderate to severe motion artifact is present on today's exam and could not be eliminated. This reduces exam sensitivity and specificity. Lower chest: No significant findings are seen within the visualized lower chest status post left mastectomy. Hepatobiliary: No focal hepatic abnormalities are identified. As demonstrated on PET-CT, there are innumerable calcified gallstones throughout the gallbladder. No evidence of gallbladder wall thickening or surrounding inflammation. The common  hepatic duct measures 6 mm in diameter and there is no intrahepatic biliary dilatation. No definite intraductal calculi are demonstrated, although evaluation is limited by the motion. Pancreas: Unremarkable. No pancreatic ductal dilatation or surrounding inflammatory changes. Spleen: Normal in size without focal abnormality. Adrenals/Urinary Tract: Both adrenal glands appear normal. Small bilateral renal cysts for which no follow-up imaging is recommended. No hydronephrosis. Stomach/Bowel: The stomach appears unremarkable for its degree of distension. No evidence of bowel wall thickening, distention or surrounding inflammatory change. Vascular/Lymphatic: There are no enlarged abdominal lymph nodes. Aortic and branch vessel atherosclerosis without evidence of aneurysm. Other: Bilateral flank edema, asymmetric to the left. No ascites or focal extraluminal fluid collection. Uterine fibroids are partially imaged. Musculoskeletal: No acute or significant osseous findings. IMPRESSION: 1. Cholelithiasis without evidence of cholecystitis. 2. No evidence of biliary dilatation or definite choledocholithiasis, although evaluation is limited by motion. 3. No acute abdominal findings. 4. Bilateral flank edema, asymmetric to the left. Electronically Signed   By: Carey Bullocks M.D.   On: 06/24/2022 14:54   MR 3D Recon At Scanner  Result Date: 06/24/2022 CLINICAL DATA:  Jaundice and cholelithiasis. History of diabetes and breast cancer. EXAM: MRI ABDOMEN WITHOUT CONTRAST  (INCLUDING MRCP) TECHNIQUE: Multiplanar multisequence MR imaging of the abdomen was performed. Heavily T2-weighted images of the biliary and pancreatic ducts were obtained, and three-dimensional MRCP images were rendered by post processing. COMPARISON:  Abdominal ultrasound 06/23/2022. PET-CT 04/28/2022. Abdominopelvic CT 01/24/2012. FINDINGS: Despite efforts by the technologist and patient, moderate to severe motion artifact is present on today's exam and  could not be eliminated. This reduces exam  sensitivity and specificity. Lower chest: No significant findings are seen within the visualized lower chest status post left mastectomy. Hepatobiliary: No focal hepatic abnormalities are identified. As demonstrated on PET-CT, there are innumerable calcified gallstones throughout the gallbladder. No evidence of gallbladder wall thickening or surrounding inflammation. The common hepatic duct measures 6 mm in diameter and there is no intrahepatic biliary dilatation. No definite intraductal calculi are demonstrated, although evaluation is limited by the motion. Pancreas: Unremarkable. No pancreatic ductal dilatation or surrounding inflammatory changes. Spleen: Normal in size without focal abnormality. Adrenals/Urinary Tract: Both adrenal glands appear normal. Small bilateral renal cysts for which no follow-up imaging is recommended. No hydronephrosis. Stomach/Bowel: The stomach appears unremarkable for its degree of distension. No evidence of bowel wall thickening, distention or surrounding inflammatory change. Vascular/Lymphatic: There are no enlarged abdominal lymph nodes. Aortic and branch vessel atherosclerosis without evidence of aneurysm. Other: Bilateral flank edema, asymmetric to the left. No ascites or focal extraluminal fluid collection. Uterine fibroids are partially imaged. Musculoskeletal: No acute or significant osseous findings. IMPRESSION: 1. Cholelithiasis without evidence of cholecystitis. 2. No evidence of biliary dilatation or definite choledocholithiasis, although evaluation is limited by motion. 3. No acute abdominal findings. 4. Bilateral flank edema, asymmetric to the left. Electronically Signed   By: Carey Bullocks M.D.   On: 06/24/2022 14:54   US Abdomen Limited RUQ (LIVER/GB)  Result Date: 06/23/2022 CLINICAL DATA:  Right upper quadrant abdominal pain EXAM: ULTRASOUND ABDOMEN LIMITED RIGHT UPPER QUADRANT COMPARISON:  PET CT 04/28/2022 FINDINGS:  Gallbladder: The gallbladder is stone filled resulting in a "wall echo shadow" complex. No superimposed gallbladder wall thickening or pericholecystic fluid is identified. The sonographic Eulah Pont sign is reportedly negative. Common bile duct: Diameter: 2 mm in proximal diameter Liver: No focal lesion identified. Within normal limits in parenchymal echogenicity. Portal vein is patent on color Doppler imaging with normal direction of blood flow towards the liver. Other: None. IMPRESSION: 1. Cholelithiasis without sonographic evidence of acute cholecystitis. Electronically Signed   By: Helyn Numbers M.D.   On: 06/23/2022 21:59    Microbiology: No results found for this or any previous visit (from the past 240 hour(s)).   Labs: Basic Metabolic Panel: Recent Labs  Lab 06/25/22 0505 06/26/22 0500 06/27/22 0546 06/28/22 0313 06/29/22 0345 06/30/22 0409 07/01/22 0440  NA 139 138 138 132* 137 135 134*  K 3.3* 3.8 3.3* 3.7 3.6 3.0* 4.7  CL 104 104 103 101 104 101 102  CO2 27 25 28 22 25 23 24   GLUCOSE 234* 236* 195* 325* 167* 225* 183*  BUN 8 17 16 17 8 13 18   CREATININE 0.53 0.83 0.58 0.75 0.66 0.69 0.67  CALCIUM 8.9 9.0 9.4 8.9 8.8* 8.4* 9.1  MG 1.7 1.7 1.7  --  1.6* 1.6* 2.2  PHOS 3.0 3.4 2.8  --   --   --   --    Liver Function Tests: Recent Labs  Lab 06/27/22 0546 06/28/22 0313 06/29/22 0345 06/30/22 0409 07/01/22 0440  AST 626* 364* 213* 175* 168*  ALT 672* 544* 392* 317* 294*  ALKPHOS 372* 356* 310* 267* 248*  BILITOT 2.5* 2.6* 1.0 0.8 0.6  PROT 6.8 6.3* 6.0* 6.0* 6.2*  ALBUMIN 3.2* 2.9* 2.6* 2.6* 2.8*   Recent Labs  Lab 06/25/22 0505 06/26/22 0500 06/27/22 0546  LIPASE 25 22 38   Recent Labs  Lab 06/28/22 0313  AMMONIA 41*   CBC: Recent Labs  Lab 06/27/22 0546 06/28/22 0313 06/29/22 0345 06/30/22 0409 07/01/22 0440  WBC 8.1 7.3 7.0 6.4 6.8  NEUTROABS  --  4.0  --   --   --   HGB 12.0 11.4* 10.9* 10.7* 10.8*  HCT 35.2* 32.6* 31.9* 31.3* 31.3*  MCV 86.9  86.5 88.1 87.4 89.9  PLT 187 162 160 165 163   Cardiac Enzymes: No results for input(s): "CKTOTAL", "CKMB", "CKMBINDEX", "TROPONINI" in the last 168 hours. BNP: BNP (last 3 results) No results for input(s): "BNP" in the last 8760 hours.  ProBNP (last 3 results) No results for input(s): "PROBNP" in the last 8760 hours.  CBG: Recent Labs  Lab 06/30/22 2123 07/01/22 0006 07/01/22 0224 07/01/22 0623 07/01/22 0855  GLUCAP 207* 199* 193* 170* 158*       Signed:  Rhetta Mura MD   [facility] 07/01/2022, 11:59 AM

## 2022-07-02 ENCOUNTER — Telehealth: Payer: Self-pay | Admitting: *Deleted

## 2022-07-02 NOTE — Transitions of Care (Post Inpatient/ED Visit) (Signed)
   07/02/2022  Name: [name] MRN: [id number] DOB: April 19, 1940  Today's TOC FU Call Status: Today's TOC FU Call Status:: Successful TOC FU Call Competed TOC FU Call Complete Date: 07/02/22  Transition Care Management Follow-up Telephone Call Date of Discharge: 07/01/22 Discharge Facility: [facility]) Type of Discharge: Inpatient Admission Primary Inpatient Discharge Diagnosis:: Abnormal LFTs How have you been since you were released from the hospital?: Better Any questions or concerns?: No  Items Reviewed: Did you receive and understand the discharge instructions provided?: Yes Medications obtained and verified?: Yes (Medications Reviewed) Any new allergies since your discharge?: No Dietary orders reviewed?: Yes Type of Diet Ordered:: low fat bland diet Do you have support at home?: Yes People in Home: spouse Name of Support/Comfort Primary Source: Sherilyn Cooter spouse and daughter [facility] Care and Equipment/Supplies: Were Home Health Services Ordered?: Yes [facility] Has Agency set up a time to come to your home?: No EMR reviewed for Home Health Orders: Orders present/patient has not received call (refer to CM for follow-up) Any new equipment or medical supplies ordered?: Yes Name of Medical supply agency?: [facility] Were you able to get the equipment/medical supplies?: Yes Do you have any questions related to the use of the equipment/supplies?: No  Functional Questionnaire: Do you need assistance with bathing/showering or dressing?: Yes Do you need assistance with meal preparation?: Yes Do you need assistance with eating?: No Do you have difficulty maintaining continence: No Do you need assistance with getting out of bed/getting out of a chair/moving?: No Do you have difficulty managing or taking your medications?: Yes  Follow up appointments reviewed: PCP Follow-up appointment  confirmed?: NA Specialist Hospital Follow-up appointment confirmed?: Yes Date of Specialist follow-up appointment?: 07/06/22 Follow-Up Specialty Provider:: radiation oncologist and call put into surgeon/ awaiting call back. Do you need transportation to your follow-up appointment?: No Do you understand care options if your condition(s) worsen?: Yes-patient verbalized understanding  SDOH Interventions Today    Flowsheet Row Most Recent Value  SDOH Interventions   Food Insecurity Interventions Intervention Not Indicated  Housing Interventions Intervention Not Indicated  Transportation Interventions Intervention Not Indicated      Interventions Today    Flowsheet Row Most Recent Value  General Interventions   General Interventions Discussed/Reviewed General Interventions Discussed, General Interventions Reviewed, Doctor Visits  Doctor Visits Discussed/Reviewed Specialist  PCP/Specialist Visits Compliance with follow-up visit  Nutrition Interventions   Nutrition Discussed/Reviewed Nutrition Discussed, Nutrition Reviewed  Pharmacy Interventions   Pharmacy Dicussed/Reviewed Pharmacy Topics Discussed, Pharmacy Topics Reviewed      TOC Interventions Today    Flowsheet Row Most Recent Value  TOC Interventions   TOC Interventions Discussed/Reviewed TOC Interventions Discussed, TOC Interventions Reviewed     .  Gean Maidens BSN RN [facility] [phone]

## 2022-07-03 ENCOUNTER — Encounter: Payer: Self-pay | Admitting: *Deleted

## 2022-07-03 ENCOUNTER — Other Ambulatory Visit: Payer: Self-pay | Admitting: *Deleted

## 2022-07-03 DIAGNOSIS — C50919 Malignant neoplasm of unspecified site of unspecified female breast: Secondary | ICD-10-CM

## 2022-07-03 NOTE — Progress Notes (Signed)
Daughter Dawn called.   Patient recently admitted and had gallbladder removed.   Her liver enzymes were elevated and they have her holding Femara.   Per Dr. Smith Robert, continue to hold Femara and she will check labs and see her in a month.

## 2022-07-06 ENCOUNTER — Encounter: Payer: Self-pay | Admitting: *Deleted

## 2022-07-06 ENCOUNTER — Ambulatory Visit
Admission: RE | Admit: 2022-07-06 | Discharge: 2022-07-06 | Disposition: A | Discharge status: Home / Self Care | Payer: Medicare HMO | Source: Ambulatory Visit | Attending: Radiation Oncology | Admitting: Radiation Oncology

## 2022-07-06 DIAGNOSIS — C50012 Malignant neoplasm of nipple and areola, left female breast: Secondary | ICD-10-CM | POA: Insufficient documentation

## 2022-07-06 DIAGNOSIS — Z170 Estrogen receptor positive status [ER+]: Secondary | ICD-10-CM | POA: Diagnosis not present

## 2022-07-07 DIAGNOSIS — C773 Secondary and unspecified malignant neoplasm of axilla and upper limb lymph nodes: Secondary | ICD-10-CM | POA: Diagnosis not present

## 2022-07-07 DIAGNOSIS — F03C0 Unspecified dementia, severe, without behavioral disturbance, psychotic disturbance, mood disturbance, and anxiety: Secondary | ICD-10-CM | POA: Diagnosis not present

## 2022-07-07 DIAGNOSIS — E1022 Type 1 diabetes mellitus with diabetic chronic kidney disease: Secondary | ICD-10-CM | POA: Diagnosis not present

## 2022-07-07 DIAGNOSIS — N1831 Chronic kidney disease, stage 3a: Secondary | ICD-10-CM | POA: Diagnosis not present

## 2022-07-07 DIAGNOSIS — C50012 Malignant neoplasm of nipple and areola, left female breast: Secondary | ICD-10-CM | POA: Diagnosis not present

## 2022-07-07 DIAGNOSIS — I252 Old myocardial infarction: Secondary | ICD-10-CM | POA: Diagnosis not present

## 2022-07-07 DIAGNOSIS — Z4803 Encounter for change or removal of drains: Secondary | ICD-10-CM | POA: Diagnosis not present

## 2022-07-07 DIAGNOSIS — E1059 Type 1 diabetes mellitus with other circulatory complications: Secondary | ICD-10-CM | POA: Diagnosis not present

## 2022-07-07 DIAGNOSIS — Z483 Aftercare following surgery for neoplasm: Secondary | ICD-10-CM | POA: Diagnosis not present

## 2022-07-09 DIAGNOSIS — C773 Secondary and unspecified malignant neoplasm of axilla and upper limb lymph nodes: Secondary | ICD-10-CM | POA: Diagnosis not present

## 2022-07-09 DIAGNOSIS — Z4803 Encounter for change or removal of drains: Secondary | ICD-10-CM | POA: Diagnosis not present

## 2022-07-09 DIAGNOSIS — N1831 Chronic kidney disease, stage 3a: Secondary | ICD-10-CM | POA: Diagnosis not present

## 2022-07-09 DIAGNOSIS — F03C0 Unspecified dementia, severe, without behavioral disturbance, psychotic disturbance, mood disturbance, and anxiety: Secondary | ICD-10-CM | POA: Diagnosis not present

## 2022-07-09 DIAGNOSIS — Z483 Aftercare following surgery for neoplasm: Secondary | ICD-10-CM | POA: Diagnosis not present

## 2022-07-09 DIAGNOSIS — E1022 Type 1 diabetes mellitus with diabetic chronic kidney disease: Secondary | ICD-10-CM | POA: Diagnosis not present

## 2022-07-09 DIAGNOSIS — I252 Old myocardial infarction: Secondary | ICD-10-CM | POA: Diagnosis not present

## 2022-07-09 DIAGNOSIS — E1059 Type 1 diabetes mellitus with other circulatory complications: Secondary | ICD-10-CM | POA: Diagnosis not present

## 2022-07-09 DIAGNOSIS — C50012 Malignant neoplasm of nipple and areola, left female breast: Secondary | ICD-10-CM | POA: Diagnosis not present

## 2022-07-10 ENCOUNTER — Other Ambulatory Visit: Payer: Self-pay | Admitting: *Deleted

## 2022-07-10 ENCOUNTER — Telehealth: Payer: Self-pay | Admitting: Family Medicine

## 2022-07-10 DIAGNOSIS — Z170 Estrogen receptor positive status [ER+]: Secondary | ICD-10-CM | POA: Diagnosis not present

## 2022-07-10 DIAGNOSIS — Z510 Encounter for antineoplastic radiation therapy: Secondary | ICD-10-CM | POA: Diagnosis not present

## 2022-07-10 DIAGNOSIS — C50012 Malignant neoplasm of nipple and areola, left female breast: Secondary | ICD-10-CM | POA: Diagnosis not present

## 2022-07-10 NOTE — Telephone Encounter (Signed)
Junious Dresser calling from [facility] is call to request OT with frequency- once a week for three weeks. CB- [phone]

## 2022-07-12 ENCOUNTER — Other Ambulatory Visit: Payer: Self-pay | Admitting: Oncology

## 2022-07-13 ENCOUNTER — Ambulatory Visit
Admission: RE | Admit: 2022-07-13 | Discharge: 2022-07-13 | Disposition: A | Discharge status: Home / Self Care | Payer: Medicare HMO | Source: Ambulatory Visit | Attending: Radiation Oncology | Admitting: Radiation Oncology

## 2022-07-13 DIAGNOSIS — Z170 Estrogen receptor positive status [ER+]: Secondary | ICD-10-CM | POA: Diagnosis not present

## 2022-07-13 DIAGNOSIS — Z510 Encounter for antineoplastic radiation therapy: Secondary | ICD-10-CM | POA: Diagnosis not present

## 2022-07-13 DIAGNOSIS — C50012 Malignant neoplasm of nipple and areola, left female breast: Secondary | ICD-10-CM | POA: Diagnosis not present

## 2022-07-14 ENCOUNTER — Ambulatory Visit
Admission: RE | Admit: 2022-07-14 | Discharge: 2022-07-14 | Disposition: A | Discharge status: Home / Self Care | Payer: Medicare HMO | Source: Ambulatory Visit | Attending: Radiation Oncology | Admitting: Radiation Oncology

## 2022-07-14 ENCOUNTER — Other Ambulatory Visit: Payer: Self-pay

## 2022-07-14 DIAGNOSIS — C50012 Malignant neoplasm of nipple and areola, left female breast: Secondary | ICD-10-CM | POA: Diagnosis not present

## 2022-07-14 DIAGNOSIS — Z510 Encounter for antineoplastic radiation therapy: Secondary | ICD-10-CM | POA: Diagnosis not present

## 2022-07-14 DIAGNOSIS — Z170 Estrogen receptor positive status [ER+]: Secondary | ICD-10-CM | POA: Diagnosis not present

## 2022-07-14 LAB — RAD ONC ARIA SESSION SUMMARY
Course Elapsed Days: 0
Plan Fractions Treated to Date: 1
Plan Prescribed Dose Per Fraction: 2 Gy
Plan Total Fractions Prescribed: 25
Plan Total Prescribed Dose: 50 Gy
Reference Point Dosage Given to Date: 2 Gy
Reference Point Session Dosage Given: 2 Gy
Session Number: 1

## 2022-07-14 NOTE — Telephone Encounter (Signed)
Verbal order given  

## 2022-07-15 ENCOUNTER — Ambulatory Visit
Admission: RE | Admit: 2022-07-15 | Discharge: 2022-07-15 | Disposition: A | Discharge status: Home / Self Care | Payer: Medicare HMO | Source: Ambulatory Visit | Attending: Radiation Oncology | Admitting: Radiation Oncology

## 2022-07-15 ENCOUNTER — Other Ambulatory Visit: Payer: Self-pay

## 2022-07-15 DIAGNOSIS — I252 Old myocardial infarction: Secondary | ICD-10-CM | POA: Diagnosis not present

## 2022-07-15 DIAGNOSIS — Z483 Aftercare following surgery for neoplasm: Secondary | ICD-10-CM | POA: Diagnosis not present

## 2022-07-15 DIAGNOSIS — E1059 Type 1 diabetes mellitus with other circulatory complications: Secondary | ICD-10-CM | POA: Diagnosis not present

## 2022-07-15 DIAGNOSIS — N1831 Chronic kidney disease, stage 3a: Secondary | ICD-10-CM | POA: Diagnosis not present

## 2022-07-15 DIAGNOSIS — E1022 Type 1 diabetes mellitus with diabetic chronic kidney disease: Secondary | ICD-10-CM | POA: Diagnosis not present

## 2022-07-15 DIAGNOSIS — C773 Secondary and unspecified malignant neoplasm of axilla and upper limb lymph nodes: Secondary | ICD-10-CM | POA: Diagnosis not present

## 2022-07-15 DIAGNOSIS — F03C0 Unspecified dementia, severe, without behavioral disturbance, psychotic disturbance, mood disturbance, and anxiety: Secondary | ICD-10-CM | POA: Diagnosis not present

## 2022-07-15 DIAGNOSIS — Z510 Encounter for antineoplastic radiation therapy: Secondary | ICD-10-CM | POA: Diagnosis not present

## 2022-07-15 DIAGNOSIS — C50012 Malignant neoplasm of nipple and areola, left female breast: Secondary | ICD-10-CM | POA: Diagnosis not present

## 2022-07-15 DIAGNOSIS — Z170 Estrogen receptor positive status [ER+]: Secondary | ICD-10-CM | POA: Diagnosis not present

## 2022-07-15 DIAGNOSIS — Z4803 Encounter for change or removal of drains: Secondary | ICD-10-CM | POA: Diagnosis not present

## 2022-07-15 LAB — RAD ONC ARIA SESSION SUMMARY
Course Elapsed Days: 1
Plan Fractions Treated to Date: 2
Plan Prescribed Dose Per Fraction: 2 Gy
Plan Total Fractions Prescribed: 25
Plan Total Prescribed Dose: 50 Gy
Reference Point Dosage Given to Date: 4 Gy
Reference Point Session Dosage Given: 2 Gy
Session Number: 2

## 2022-07-16 ENCOUNTER — Ambulatory Visit
Admission: RE | Admit: 2022-07-16 | Discharge: 2022-07-16 | Disposition: A | Discharge status: Home / Self Care | Payer: Medicare HMO | Source: Ambulatory Visit | Attending: Radiation Oncology | Admitting: Radiation Oncology

## 2022-07-16 ENCOUNTER — Other Ambulatory Visit: Payer: Self-pay

## 2022-07-16 DIAGNOSIS — C50012 Malignant neoplasm of nipple and areola, left female breast: Secondary | ICD-10-CM | POA: Diagnosis not present

## 2022-07-16 DIAGNOSIS — Z510 Encounter for antineoplastic radiation therapy: Secondary | ICD-10-CM | POA: Diagnosis not present

## 2022-07-16 DIAGNOSIS — Z170 Estrogen receptor positive status [ER+]: Secondary | ICD-10-CM | POA: Diagnosis not present

## 2022-07-16 LAB — RAD ONC ARIA SESSION SUMMARY
Course Elapsed Days: 2
Plan Fractions Treated to Date: 3
Plan Prescribed Dose Per Fraction: 2 Gy
Plan Total Fractions Prescribed: 25
Plan Total Prescribed Dose: 50 Gy
Reference Point Dosage Given to Date: 6 Gy
Reference Point Session Dosage Given: 2 Gy
Session Number: 3

## 2022-07-17 ENCOUNTER — Ambulatory Visit
Admission: RE | Admit: 2022-07-17 | Discharge: 2022-07-17 | Disposition: A | Discharge status: Home / Self Care | Payer: Medicare HMO | Source: Ambulatory Visit | Attending: Radiation Oncology | Admitting: Radiation Oncology

## 2022-07-17 ENCOUNTER — Other Ambulatory Visit: Payer: Self-pay

## 2022-07-17 DIAGNOSIS — C50012 Malignant neoplasm of nipple and areola, left female breast: Secondary | ICD-10-CM | POA: Diagnosis not present

## 2022-07-17 DIAGNOSIS — Z510 Encounter for antineoplastic radiation therapy: Secondary | ICD-10-CM | POA: Diagnosis not present

## 2022-07-17 DIAGNOSIS — Z170 Estrogen receptor positive status [ER+]: Secondary | ICD-10-CM | POA: Diagnosis not present

## 2022-07-17 LAB — RAD ONC ARIA SESSION SUMMARY
Course Elapsed Days: 3
Plan Fractions Treated to Date: 4
Plan Prescribed Dose Per Fraction: 2 Gy
Plan Total Fractions Prescribed: 25
Plan Total Prescribed Dose: 50 Gy
Reference Point Dosage Given to Date: 8 Gy
Reference Point Session Dosage Given: 2 Gy
Session Number: 4

## 2022-07-20 ENCOUNTER — Other Ambulatory Visit: Payer: Self-pay

## 2022-07-20 ENCOUNTER — Inpatient Hospital Stay: Payer: Medicare HMO

## 2022-07-20 ENCOUNTER — Ambulatory Visit
Admission: RE | Admit: 2022-07-20 | Discharge: 2022-07-20 | Disposition: A | Discharge status: Home / Self Care | Payer: Medicare HMO | Source: Ambulatory Visit | Attending: Radiation Oncology | Admitting: Radiation Oncology

## 2022-07-20 DIAGNOSIS — F0390 Unspecified dementia without behavioral disturbance: Secondary | ICD-10-CM | POA: Insufficient documentation

## 2022-07-20 DIAGNOSIS — Z170 Estrogen receptor positive status [ER+]: Secondary | ICD-10-CM | POA: Insufficient documentation

## 2022-07-20 DIAGNOSIS — C50919 Malignant neoplasm of unspecified site of unspecified female breast: Secondary | ICD-10-CM

## 2022-07-20 DIAGNOSIS — R7989 Other specified abnormal findings of blood chemistry: Secondary | ICD-10-CM | POA: Insufficient documentation

## 2022-07-20 DIAGNOSIS — Z510 Encounter for antineoplastic radiation therapy: Secondary | ICD-10-CM | POA: Diagnosis not present

## 2022-07-20 DIAGNOSIS — C50012 Malignant neoplasm of nipple and areola, left female breast: Secondary | ICD-10-CM | POA: Insufficient documentation

## 2022-07-20 LAB — RAD ONC ARIA SESSION SUMMARY
Course Elapsed Days: 6
Plan Fractions Treated to Date: 5
Plan Prescribed Dose Per Fraction: 2 Gy
Plan Total Fractions Prescribed: 25
Plan Total Prescribed Dose: 50 Gy
Reference Point Dosage Given to Date: 10 Gy
Reference Point Session Dosage Given: 2 Gy
Session Number: 5

## 2022-07-20 LAB — CBC WITH DIFFERENTIAL/PLATELET
Abs Immature Granulocytes: 0.01 10*3/uL (ref 0.00–0.07)
Basophils Absolute: 0 10*3/uL (ref 0.0–0.1)
Basophils Relative: 1 %
Eosinophils Absolute: 0.1 10*3/uL (ref 0.0–0.5)
Eosinophils Relative: 1 %
HCT: 37.4 % (ref 36.0–46.0)
Hemoglobin: 12.2 g/dL (ref 12.0–15.0)
Immature Granulocytes: 0 %
Lymphocytes Relative: 38 %
Lymphs Abs: 2.3 10*3/uL (ref 0.7–4.0)
MCH: 29.5 pg (ref 26.0–34.0)
MCHC: 32.6 g/dL (ref 30.0–36.0)
MCV: 90.3 fL (ref 80.0–100.0)
Monocytes Absolute: 0.6 10*3/uL (ref 0.1–1.0)
Monocytes Relative: 11 %
Neutro Abs: 2.9 10*3/uL (ref 1.7–7.7)
Neutrophils Relative %: 49 %
Platelets: 280 10*3/uL (ref 150–400)
RBC: 4.14 MIL/uL (ref 3.87–5.11)
RDW: 13.6 % (ref 11.5–15.5)
WBC: 6 10*3/uL (ref 4.0–10.5)
nRBC: 0 % (ref 0.0–0.2)

## 2022-07-21 ENCOUNTER — Ambulatory Visit
Admission: RE | Admit: 2022-07-21 | Discharge: 2022-07-21 | Disposition: A | Discharge status: Home / Self Care | Payer: Medicare HMO | Source: Ambulatory Visit | Attending: Radiation Oncology | Admitting: Radiation Oncology

## 2022-07-21 ENCOUNTER — Other Ambulatory Visit: Payer: Self-pay

## 2022-07-21 DIAGNOSIS — N1831 Chronic kidney disease, stage 3a: Secondary | ICD-10-CM | POA: Diagnosis not present

## 2022-07-21 DIAGNOSIS — E1022 Type 1 diabetes mellitus with diabetic chronic kidney disease: Secondary | ICD-10-CM | POA: Diagnosis not present

## 2022-07-21 DIAGNOSIS — F03C0 Unspecified dementia, severe, without behavioral disturbance, psychotic disturbance, mood disturbance, and anxiety: Secondary | ICD-10-CM | POA: Diagnosis not present

## 2022-07-21 DIAGNOSIS — Z483 Aftercare following surgery for neoplasm: Secondary | ICD-10-CM | POA: Diagnosis not present

## 2022-07-21 DIAGNOSIS — E1059 Type 1 diabetes mellitus with other circulatory complications: Secondary | ICD-10-CM | POA: Diagnosis not present

## 2022-07-21 DIAGNOSIS — Z170 Estrogen receptor positive status [ER+]: Secondary | ICD-10-CM | POA: Diagnosis not present

## 2022-07-21 DIAGNOSIS — I252 Old myocardial infarction: Secondary | ICD-10-CM | POA: Diagnosis not present

## 2022-07-21 DIAGNOSIS — C50012 Malignant neoplasm of nipple and areola, left female breast: Secondary | ICD-10-CM | POA: Diagnosis not present

## 2022-07-21 DIAGNOSIS — Z510 Encounter for antineoplastic radiation therapy: Secondary | ICD-10-CM | POA: Diagnosis not present

## 2022-07-21 DIAGNOSIS — Z4803 Encounter for change or removal of drains: Secondary | ICD-10-CM | POA: Diagnosis not present

## 2022-07-21 DIAGNOSIS — C773 Secondary and unspecified malignant neoplasm of axilla and upper limb lymph nodes: Secondary | ICD-10-CM | POA: Diagnosis not present

## 2022-07-21 LAB — RAD ONC ARIA SESSION SUMMARY
Course Elapsed Days: 7
Plan Fractions Treated to Date: 6
Plan Prescribed Dose Per Fraction: 2 Gy
Plan Total Fractions Prescribed: 25
Plan Total Prescribed Dose: 50 Gy
Reference Point Dosage Given to Date: 12 Gy
Reference Point Session Dosage Given: 2 Gy
Session Number: 6

## 2022-07-22 ENCOUNTER — Other Ambulatory Visit: Payer: Self-pay

## 2022-07-22 ENCOUNTER — Ambulatory Visit
Admission: RE | Admit: 2022-07-22 | Discharge: 2022-07-22 | Disposition: A | Discharge status: Home / Self Care | Payer: Medicare HMO | Source: Ambulatory Visit | Attending: Radiation Oncology | Admitting: Radiation Oncology

## 2022-07-22 DIAGNOSIS — I252 Old myocardial infarction: Secondary | ICD-10-CM | POA: Diagnosis not present

## 2022-07-22 DIAGNOSIS — Z483 Aftercare following surgery for neoplasm: Secondary | ICD-10-CM | POA: Diagnosis not present

## 2022-07-22 DIAGNOSIS — F03C0 Unspecified dementia, severe, without behavioral disturbance, psychotic disturbance, mood disturbance, and anxiety: Secondary | ICD-10-CM | POA: Diagnosis not present

## 2022-07-22 DIAGNOSIS — E1022 Type 1 diabetes mellitus with diabetic chronic kidney disease: Secondary | ICD-10-CM | POA: Diagnosis not present

## 2022-07-22 DIAGNOSIS — Z170 Estrogen receptor positive status [ER+]: Secondary | ICD-10-CM | POA: Diagnosis not present

## 2022-07-22 DIAGNOSIS — E1059 Type 1 diabetes mellitus with other circulatory complications: Secondary | ICD-10-CM | POA: Diagnosis not present

## 2022-07-22 DIAGNOSIS — N1831 Chronic kidney disease, stage 3a: Secondary | ICD-10-CM | POA: Diagnosis not present

## 2022-07-22 DIAGNOSIS — C50012 Malignant neoplasm of nipple and areola, left female breast: Secondary | ICD-10-CM | POA: Diagnosis not present

## 2022-07-22 DIAGNOSIS — Z510 Encounter for antineoplastic radiation therapy: Secondary | ICD-10-CM | POA: Diagnosis not present

## 2022-07-22 DIAGNOSIS — Z4803 Encounter for change or removal of drains: Secondary | ICD-10-CM | POA: Diagnosis not present

## 2022-07-22 DIAGNOSIS — C773 Secondary and unspecified malignant neoplasm of axilla and upper limb lymph nodes: Secondary | ICD-10-CM | POA: Diagnosis not present

## 2022-07-22 LAB — RAD ONC ARIA SESSION SUMMARY
Course Elapsed Days: 8
Plan Fractions Treated to Date: 7
Plan Prescribed Dose Per Fraction: 2 Gy
Plan Total Fractions Prescribed: 25
Plan Total Prescribed Dose: 50 Gy
Reference Point Dosage Given to Date: 14 Gy
Reference Point Session Dosage Given: 2 Gy
Session Number: 7

## 2022-07-23 ENCOUNTER — Ambulatory Visit
Admission: RE | Admit: 2022-07-23 | Discharge: 2022-07-23 | Disposition: A | Discharge status: Home / Self Care | Payer: Medicare HMO | Source: Ambulatory Visit | Attending: Radiation Oncology | Admitting: Radiation Oncology

## 2022-07-23 ENCOUNTER — Other Ambulatory Visit: Payer: Self-pay

## 2022-07-23 ENCOUNTER — Encounter: Payer: Self-pay | Admitting: *Deleted

## 2022-07-23 DIAGNOSIS — Z170 Estrogen receptor positive status [ER+]: Secondary | ICD-10-CM | POA: Diagnosis not present

## 2022-07-23 DIAGNOSIS — C50012 Malignant neoplasm of nipple and areola, left female breast: Secondary | ICD-10-CM | POA: Diagnosis not present

## 2022-07-23 DIAGNOSIS — Z510 Encounter for antineoplastic radiation therapy: Secondary | ICD-10-CM | POA: Diagnosis not present

## 2022-07-23 LAB — RAD ONC ARIA SESSION SUMMARY
Course Elapsed Days: 9
Plan Fractions Treated to Date: 8
Plan Prescribed Dose Per Fraction: 2 Gy
Plan Total Fractions Prescribed: 25
Plan Total Prescribed Dose: 50 Gy
Reference Point Dosage Given to Date: 16 Gy
Reference Point Session Dosage Given: 2 Gy
Session Number: 8

## 2022-07-23 NOTE — Progress Notes (Signed)
Daughter Alvis Lemmings called and asked if patient needs Dexa scan next week while letrozole is on hold.   Dr. Smith Robert said it could be canceled for now.  Notified Dawn and called [facility] to have it canceled.

## 2022-07-24 ENCOUNTER — Ambulatory Visit
Admission: RE | Admit: 2022-07-24 | Discharge: 2022-07-24 | Disposition: A | Discharge status: Home / Self Care | Payer: Medicare HMO | Source: Ambulatory Visit | Attending: Radiation Oncology | Admitting: Radiation Oncology

## 2022-07-24 ENCOUNTER — Other Ambulatory Visit: Payer: Self-pay

## 2022-07-24 DIAGNOSIS — Z510 Encounter for antineoplastic radiation therapy: Secondary | ICD-10-CM | POA: Diagnosis not present

## 2022-07-24 DIAGNOSIS — Z170 Estrogen receptor positive status [ER+]: Secondary | ICD-10-CM | POA: Diagnosis not present

## 2022-07-24 DIAGNOSIS — C50012 Malignant neoplasm of nipple and areola, left female breast: Secondary | ICD-10-CM | POA: Diagnosis not present

## 2022-07-24 LAB — RAD ONC ARIA SESSION SUMMARY
Course Elapsed Days: 10
Plan Fractions Treated to Date: 9
Plan Prescribed Dose Per Fraction: 2 Gy
Plan Total Fractions Prescribed: 25
Plan Total Prescribed Dose: 50 Gy
Reference Point Dosage Given to Date: 18 Gy
Reference Point Session Dosage Given: 2 Gy
Session Number: 9

## 2022-07-26 DIAGNOSIS — E1022 Type 1 diabetes mellitus with diabetic chronic kidney disease: Secondary | ICD-10-CM | POA: Diagnosis not present

## 2022-07-26 DIAGNOSIS — F03C0 Unspecified dementia, severe, without behavioral disturbance, psychotic disturbance, mood disturbance, and anxiety: Secondary | ICD-10-CM | POA: Diagnosis not present

## 2022-07-26 DIAGNOSIS — M810 Age-related osteoporosis without current pathological fracture: Secondary | ICD-10-CM | POA: Diagnosis not present

## 2022-07-26 DIAGNOSIS — C773 Secondary and unspecified malignant neoplasm of axilla and upper limb lymph nodes: Secondary | ICD-10-CM | POA: Diagnosis not present

## 2022-07-26 DIAGNOSIS — I129 Hypertensive chronic kidney disease with stage 1 through stage 4 chronic kidney disease, or unspecified chronic kidney disease: Secondary | ICD-10-CM | POA: Diagnosis not present

## 2022-07-26 DIAGNOSIS — Z48815 Encounter for surgical aftercare following surgery on the digestive system: Secondary | ICD-10-CM | POA: Diagnosis not present

## 2022-07-26 DIAGNOSIS — C50012 Malignant neoplasm of nipple and areola, left female breast: Secondary | ICD-10-CM | POA: Diagnosis not present

## 2022-07-26 DIAGNOSIS — N1831 Chronic kidney disease, stage 3a: Secondary | ICD-10-CM | POA: Diagnosis not present

## 2022-07-26 DIAGNOSIS — E1059 Type 1 diabetes mellitus with other circulatory complications: Secondary | ICD-10-CM | POA: Diagnosis not present

## 2022-07-27 ENCOUNTER — Other Ambulatory Visit: Payer: Self-pay

## 2022-07-27 ENCOUNTER — Ambulatory Visit
Admission: RE | Admit: 2022-07-27 | Discharge: 2022-07-27 | Disposition: A | Discharge status: Home / Self Care | Payer: Medicare HMO | Source: Ambulatory Visit | Attending: Radiation Oncology | Admitting: Radiation Oncology

## 2022-07-27 DIAGNOSIS — Z510 Encounter for antineoplastic radiation therapy: Secondary | ICD-10-CM | POA: Diagnosis not present

## 2022-07-27 DIAGNOSIS — C50012 Malignant neoplasm of nipple and areola, left female breast: Secondary | ICD-10-CM | POA: Diagnosis not present

## 2022-07-27 DIAGNOSIS — Z170 Estrogen receptor positive status [ER+]: Secondary | ICD-10-CM | POA: Diagnosis not present

## 2022-07-27 LAB — RAD ONC ARIA SESSION SUMMARY
Course Elapsed Days: 13
Plan Fractions Treated to Date: 10
Plan Prescribed Dose Per Fraction: 2 Gy
Plan Total Fractions Prescribed: 25
Plan Total Prescribed Dose: 50 Gy
Reference Point Dosage Given to Date: 20 Gy
Reference Point Session Dosage Given: 2 Gy
Session Number: 10

## 2022-07-28 ENCOUNTER — Ambulatory Visit
Admission: RE | Admit: 2022-07-28 | Discharge: 2022-07-28 | Disposition: A | Discharge status: Home / Self Care | Payer: Medicare HMO | Source: Ambulatory Visit | Attending: Radiation Oncology | Admitting: Radiation Oncology

## 2022-07-28 ENCOUNTER — Other Ambulatory Visit: Payer: Self-pay

## 2022-07-28 DIAGNOSIS — Z170 Estrogen receptor positive status [ER+]: Secondary | ICD-10-CM | POA: Diagnosis not present

## 2022-07-28 DIAGNOSIS — C50012 Malignant neoplasm of nipple and areola, left female breast: Secondary | ICD-10-CM | POA: Diagnosis not present

## 2022-07-28 DIAGNOSIS — N1831 Chronic kidney disease, stage 3a: Secondary | ICD-10-CM | POA: Diagnosis not present

## 2022-07-28 DIAGNOSIS — F03C0 Unspecified dementia, severe, without behavioral disturbance, psychotic disturbance, mood disturbance, and anxiety: Secondary | ICD-10-CM | POA: Diagnosis not present

## 2022-07-28 DIAGNOSIS — E1022 Type 1 diabetes mellitus with diabetic chronic kidney disease: Secondary | ICD-10-CM | POA: Diagnosis not present

## 2022-07-28 DIAGNOSIS — E1059 Type 1 diabetes mellitus with other circulatory complications: Secondary | ICD-10-CM | POA: Diagnosis not present

## 2022-07-28 DIAGNOSIS — C773 Secondary and unspecified malignant neoplasm of axilla and upper limb lymph nodes: Secondary | ICD-10-CM | POA: Diagnosis not present

## 2022-07-28 DIAGNOSIS — Z510 Encounter for antineoplastic radiation therapy: Secondary | ICD-10-CM | POA: Diagnosis not present

## 2022-07-28 DIAGNOSIS — I129 Hypertensive chronic kidney disease with stage 1 through stage 4 chronic kidney disease, or unspecified chronic kidney disease: Secondary | ICD-10-CM | POA: Diagnosis not present

## 2022-07-28 DIAGNOSIS — Z48815 Encounter for surgical aftercare following surgery on the digestive system: Secondary | ICD-10-CM | POA: Diagnosis not present

## 2022-07-28 DIAGNOSIS — M810 Age-related osteoporosis without current pathological fracture: Secondary | ICD-10-CM | POA: Diagnosis not present

## 2022-07-28 LAB — RAD ONC ARIA SESSION SUMMARY
Course Elapsed Days: 14
Plan Fractions Treated to Date: 11
Plan Prescribed Dose Per Fraction: 2 Gy
Plan Total Fractions Prescribed: 25
Plan Total Prescribed Dose: 50 Gy
Reference Point Dosage Given to Date: 22 Gy
Reference Point Session Dosage Given: 2 Gy
Session Number: 11

## 2022-07-29 ENCOUNTER — Inpatient Hospital Stay: Payer: Medicare HMO

## 2022-07-29 ENCOUNTER — Ambulatory Visit
Admission: RE | Admit: 2022-07-29 | Discharge: 2022-07-29 | Disposition: A | Discharge status: Home / Self Care | Payer: Medicare HMO | Source: Ambulatory Visit | Attending: Radiation Oncology | Admitting: Radiation Oncology

## 2022-07-29 ENCOUNTER — Other Ambulatory Visit: Payer: Self-pay

## 2022-07-29 ENCOUNTER — Inpatient Hospital Stay (HOSPITAL_BASED_OUTPATIENT_CLINIC_OR_DEPARTMENT_OTHER): Payer: Medicare HMO | Admitting: Oncology

## 2022-07-29 ENCOUNTER — Encounter: Payer: Self-pay | Admitting: Oncology

## 2022-07-29 VITALS — BP 121/57 | HR 58 | Temp 96.6°F | Resp 18 | Ht 62.0 in | Wt 126.7 lb

## 2022-07-29 DIAGNOSIS — R7989 Other specified abnormal findings of blood chemistry: Secondary | ICD-10-CM | POA: Diagnosis not present

## 2022-07-29 DIAGNOSIS — C773 Secondary and unspecified malignant neoplasm of axilla and upper limb lymph nodes: Secondary | ICD-10-CM | POA: Diagnosis not present

## 2022-07-29 DIAGNOSIS — Z170 Estrogen receptor positive status [ER+]: Secondary | ICD-10-CM | POA: Diagnosis not present

## 2022-07-29 DIAGNOSIS — C50012 Malignant neoplasm of nipple and areola, left female breast: Secondary | ICD-10-CM

## 2022-07-29 DIAGNOSIS — Z48815 Encounter for surgical aftercare following surgery on the digestive system: Secondary | ICD-10-CM | POA: Diagnosis not present

## 2022-07-29 DIAGNOSIS — Z510 Encounter for antineoplastic radiation therapy: Secondary | ICD-10-CM | POA: Diagnosis not present

## 2022-07-29 DIAGNOSIS — E1059 Type 1 diabetes mellitus with other circulatory complications: Secondary | ICD-10-CM | POA: Diagnosis not present

## 2022-07-29 DIAGNOSIS — E1022 Type 1 diabetes mellitus with diabetic chronic kidney disease: Secondary | ICD-10-CM | POA: Diagnosis not present

## 2022-07-29 DIAGNOSIS — N1831 Chronic kidney disease, stage 3a: Secondary | ICD-10-CM | POA: Diagnosis not present

## 2022-07-29 DIAGNOSIS — M810 Age-related osteoporosis without current pathological fracture: Secondary | ICD-10-CM

## 2022-07-29 DIAGNOSIS — F03C0 Unspecified dementia, severe, without behavioral disturbance, psychotic disturbance, mood disturbance, and anxiety: Secondary | ICD-10-CM | POA: Diagnosis not present

## 2022-07-29 DIAGNOSIS — I129 Hypertensive chronic kidney disease with stage 1 through stage 4 chronic kidney disease, or unspecified chronic kidney disease: Secondary | ICD-10-CM | POA: Diagnosis not present

## 2022-07-29 DIAGNOSIS — Z7189 Other specified counseling: Secondary | ICD-10-CM

## 2022-07-29 LAB — CBC (CANCER CENTER ONLY)
HCT: 37.4 % (ref 36.0–46.0)
Hemoglobin: 12.2 g/dL (ref 12.0–15.0)
MCH: 29.3 pg (ref 26.0–34.0)
MCHC: 32.6 g/dL (ref 30.0–36.0)
MCV: 89.9 fL (ref 80.0–100.0)
Platelet Count: 183 10*3/uL (ref 150–400)
RBC: 4.16 MIL/uL (ref 3.87–5.11)
RDW: 13.8 % (ref 11.5–15.5)
WBC Count: 5.5 10*3/uL (ref 4.0–10.5)
nRBC: 0 % (ref 0.0–0.2)

## 2022-07-29 LAB — COMPREHENSIVE METABOLIC PANEL
ALT: 24 U/L (ref 0–44)
AST: 29 U/L (ref 15–41)
Albumin: 3.9 g/dL (ref 3.5–5.0)
Alkaline Phosphatase: 91 U/L (ref 38–126)
Anion gap: 10 (ref 5–15)
BUN: 16 mg/dL (ref 8–23)
CO2: 27 mmol/L (ref 22–32)
Calcium: 9.5 mg/dL (ref 8.9–10.3)
Chloride: 102 mmol/L (ref 98–111)
Creatinine, Ser: 0.66 mg/dL (ref 0.44–1.00)
GFR, Estimated: >60 mL/min (ref >60)
Glucose, Bld: 91 mg/dL (ref 70–99)
Potassium: 3.5 mmol/L (ref 3.5–5.1)
Sodium: 139 mmol/L (ref 135–145)
Total Bilirubin: 0.6 mg/dL (ref 0.3–1.2)
Total Protein: 7.6 g/dL (ref 6.5–8.1)

## 2022-07-29 LAB — RAD ONC ARIA SESSION SUMMARY
Course Elapsed Days: 15
Plan Fractions Treated to Date: 12
Plan Prescribed Dose Per Fraction: 2 Gy
Plan Total Fractions Prescribed: 25
Plan Total Prescribed Dose: 50 Gy
Reference Point Dosage Given to Date: 24 Gy
Reference Point Session Dosage Given: 2 Gy
Session Number: 12

## 2022-07-30 ENCOUNTER — Other Ambulatory Visit: Payer: Medicare HMO

## 2022-07-30 ENCOUNTER — Ambulatory Visit
Admission: RE | Admit: 2022-07-30 | Discharge: 2022-07-30 | Disposition: A | Discharge status: Home / Self Care | Payer: Medicare HMO | Source: Ambulatory Visit | Attending: Radiation Oncology | Admitting: Radiation Oncology

## 2022-07-30 ENCOUNTER — Other Ambulatory Visit: Payer: Self-pay

## 2022-07-30 DIAGNOSIS — Z510 Encounter for antineoplastic radiation therapy: Secondary | ICD-10-CM | POA: Diagnosis not present

## 2022-07-30 DIAGNOSIS — Z170 Estrogen receptor positive status [ER+]: Secondary | ICD-10-CM | POA: Diagnosis not present

## 2022-07-30 DIAGNOSIS — C50012 Malignant neoplasm of nipple and areola, left female breast: Secondary | ICD-10-CM | POA: Diagnosis not present

## 2022-07-30 LAB — RAD ONC ARIA SESSION SUMMARY
Course Elapsed Days: 16
Plan Fractions Treated to Date: 13
Plan Prescribed Dose Per Fraction: 2 Gy
Plan Total Fractions Prescribed: 25
Plan Total Prescribed Dose: 50 Gy
Reference Point Dosage Given to Date: 26 Gy
Reference Point Session Dosage Given: 2 Gy
Session Number: 13

## 2022-07-30 NOTE — Progress Notes (Signed)
Hematology/Oncology Consult note [facility]  Telephone:([phone] [phone]  Patient Care Team: Alba Cory, MD as PCP - General (Family Medicine) [place] Crane, MD as Consulting Physician (Ophthalmology) Sherlon Handing, MD as Consulting Physician (Internal Medicine) Hulen Luster, RN as Oncology Nurse Navigator   Name of the patient: [name]  [id number]  08/28/1940   Date of visit: 07/30/22  Diagnosis- pathological prognostic stage IIa invasive mammary carcinoma of the left breast pT2 N1a M0 ER/PR positive HER2 negative    Chief complaint/ Reason for visit-routine follow-up of breast cancer  Heme/Onc history: Patient is a 82 year old female who underwent a bilateral diagnostic mammogram for palpable mass in the left breast in January 2024.  Mammogram showed hypoechoic mass measuring 3.1 x 1.9 x 3.4 cm in the left retroareolar breast at 2 o'clock position with invasion into the dermis and retroareolar breast.  2 enlarged left axillary lymph nodes.  No mammographic evidence of malignancy in the right breast.  Patient had a left breast biopsy as well as biopsy of the left axillary lymph which showed invasive mammary carcinoma grade 310 mm in the primary breast specimen and 7 mm in the lymph node.  Tumor was ER greater than 90% positive, PR 60% positive and HER2 negative.   Patient is here with her daughter today who is her healthcare power of attorney.  Patient has advanced dementia and is dependent on her family members for ADLs including dressing and ambulation to some extent.  She is incontinent.  She has not had any recent infections or hospitalizations.   Final pathology showed 3.2 cm grade 3 invasive mammary carcinoma with negative margins.  3 out of 15 lymph nodes were positive for malignancy.  Patient had started letrozole briefly but was held due to abnormal LFTs which were noticed at the time of her cholecystitis.  She is  currently undergoing adjuvant radiation.adjuvant chemotherapy not offered due to age and dementia  Interval history-patient currently feels back to her baseline.  Tolerating radiation treatments well so far.  She does not verbalize much due to her dementia.  ECOG PS- 3   Review of systems- Review of Systems  Unable to perform ROS: Dementia      Allergies  Allergen Reactions   Colesevelam Hcl     scotomas   Daucus Carota    Penicillins Itching     Past Medical History:  Diagnosis Date   Arthritis    Atherosclerosis of aorta (HCC)    CKD (chronic kidney disease) stage 3, GFR 30-59 ml/min (HCC)    Dementia (HCC)    Diabetes mellitus without complication (HCC)    Dysrhythmia    Heart murmur    Hypertension    Type 1 diabetes (HCC)      Past Surgical History:  Procedure Laterality Date   AXILLARY LYMPH NODE BIOPSY Left 04/07/2022   bx done, hydromarker placed, path pending   BREAST BIOPSY Left 04/07/2022   [place] bx mass, ribbon marker, path pending   BREAST BIOPSY Left 04/07/2022   [place] LT BREAST BX W LOC DEV 1ST LESION IMG BX SPEC US GUIDE 04/07/2022 [facility]   BREAST EXCISIONAL BIOPSY Left    neg   BREAST SURGERY  1970   EYE SURGERY     detached retina   MASTECTOMY MODIFIED RADICAL Left 05/25/2022   Procedure: MASTECTOMY MODIFIED RADICAL;  Surgeon: Carolan Shiver, MD;  Location: [facility] ORS;  Service: General;  Laterality: Left;    Social  History   Socioeconomic History   Marital status: Married    Spouse name: Sherilyn Cooter   Number of children: 3   Years of education: Not on file   Highest education level: 12th grade  Occupational History   Occupation: Retired  Tobacco Use   Smoking status: Never   Smokeless tobacco: Never   Tobacco comments:    smoking cessation materials not required  Vaping Use   Vaping Use: Never used  Substance and Sexual Activity   Alcohol use: No    Alcohol/week: 0.0 standard drinks of alcohol   Drug use: No   Sexual  activity: Not Currently  Other Topics Concern   Not on file  Social History Narrative   Not on file   Social Determinants of Health   Financial Resource Strain: Low Risk  (05/14/2022)   Overall Financial Resource Strain (CARDIA)    Difficulty of Paying Living Expenses: Not very hard  Food Insecurity: No Food Insecurity (07/02/2022)   Hunger Vital Sign    Worried About Running Out of Food in the Last Year: Never true    Ran Out of Food in the Last Year: Never true  Transportation Needs: No Transportation Needs (07/02/2022)   PRAPARE - Administrator, Civil Service (Medical): No    Lack of Transportation (Non-Medical): No  Physical Activity: Inactive (05/14/2022)   Exercise Vital Sign    Days of Exercise per Week: 0 days    Minutes of Exercise per Session: 0 min  Stress: No Stress Concern Present (05/14/2022)   [employer] of Occupational Health - Occupational Stress Questionnaire    Feeling of Stress : Only a little  Social Connections: Socially Integrated (05/14/2022)   Social Connection and Isolation Panel [NHANES]    Frequency of Communication with Friends and Family: Never    Frequency of Social Gatherings with Friends and Family: More than three times a week    Attends Religious Services: 1 to 4 times per year    Active Member of [employer] or Organizations: Yes    Attends Banker Meetings: Never    Marital Status: Married  Catering manager Violence: Not At Risk (06/28/2022)   Humiliation, Afraid, Rape, and Kick questionnaire    Fear of Current or Ex-Partner: No    Emotionally Abused: No    Physically Abused: No    Sexually Abused: No    Family History  Problem Relation Age of Onset   Alzheimer's disease Mother    Diabetes Mother    Arthritis Mother    Diabetes Father    Hypertension Father    Hyperlipidemia Father    Stroke Brother    Multiple sclerosis Daughter      Current Outpatient Medications:    Accu-Chek Softclix Lancets lancets, ,  Disp: , Rfl:    alendronate (FOSAMAX) 70 MG tablet, Take with a full glass of water on an empty stomach. (Patient taking differently: 70 mg once a week. Take with a full glass of water on an empty stomach.), Disp: 12 tablet, Rfl: 1   amLODipine (NORVASC) 5 MG tablet, Take 1 tablet (5 mg total) by mouth daily., Disp: 90 tablet, Rfl: 1   blood glucose meter kit and supplies, , Disp: , Rfl:    Calcium Carb-Cholecalciferol 600-10 MG-MCG TABS, Take 1 tablet by mouth 2 (two) times daily., Disp: , Rfl:    [employer] ALCOHOL SWABS PADS, USE 4 TIMES DAILY TO WIPE SKIN BEFORE USING INSULIN, Disp: 400 each, Rfl: 2  donepezil (ARICEPT ODT) 10 MG disintegrating tablet, TAKE 1 TABLET BY MOUTH EVERYDAY AT BEDTIME (Patient taking differently: Take 10 mg by mouth at bedtime. TAKE 1 TABLET BY MOUTH EVERYDAY AT BEDTIME), Disp: 90 tablet, Rfl: 1   Glucagon (BAQSIMI ONE PACK) 3 MG/DOSE POWD, , Disp: , Rfl:    insulin aspart (NOVOLOG) 100 UNIT/ML FlexPen, Inject 6-10 Units into the skin 3 (three) times daily with meals., Disp: , Rfl:    insulin glargine-yfgn (SEMGLEE) 100 UNIT/ML injection, Inject 0.05 mLs (5 Units total) into the skin daily., Disp: 10 mL, Rfl: 11   letrozole (FEMARA) 2.5 MG tablet, TAKE 1 TABLET BY MOUTH EVERY DAY, Disp: 90 tablet, Rfl: 1   losartan-hydrochlorothiazide (HYZAAR) 100-12.5 MG tablet, Take 1 tablet by mouth daily., Disp: 90 tablet, Rfl: 1   polyethylene glycol (MIRALAX / GLYCOLAX) 17 g packet, Take 17 g by mouth daily., Disp: 14 each, Rfl: 0   traMADol (ULTRAM) 50 MG tablet, Take 1 tablet (50 mg total) by mouth every 6 (six) hours as needed., Disp: 20 tablet, Rfl: 0  Physical exam:  Vitals:   07/29/22 1309  BP: (!) 121/57  Pulse: (!) 58  Resp: 18  Temp: (!) 96.6 F (35.9 C)  TempSrc: Tympanic  SpO2: 98%  Weight: 126 lb 11.2 oz (57.5 kg)  Height: 5\' 2"  (1.575 m)   Physical Exam Cardiovascular:     Rate and Rhythm: Normal rate and regular rhythm.     Heart sounds: Normal heart sounds.   Pulmonary:     Effort: Pulmonary effort is normal.     Breath sounds: Normal breath sounds.  Skin:    General: Skin is warm and dry.  Neurological:     Mental Status: She is alert and oriented to person, place, and time.   Patient is s/p left mastectomy without reconstruction chest wall recurrence and mass to be scar is healing well     Latest Ref Rng & Units 07/29/2022   12:43 PM  CMP  Glucose 70 - 99 mg/dL 91   BUN 8 - 23 mg/dL 16   Creatinine 1.61 - 1.00 mg/dL 0.96   Sodium 045 - 409 mmol/L 139   Potassium 3.5 - 5.1 mmol/L 3.5   Chloride 98 - 111 mmol/L 102   CO2 22 - 32 mmol/L 27   Calcium 8.9 - 10.3 mg/dL 9.5   Total Protein 6.5 - 8.1 g/dL 7.6   Total Bilirubin 0.3 - 1.2 mg/dL 0.6   Alkaline Phos 38 - 126 U/L 91   AST 15 - 41 U/L 29   ALT 0 - 44 U/L 24       Latest Ref Rng & Units 07/29/2022   12:43 PM  CBC  WBC 4.0 - 10.5 K/uL 5.5   Hemoglobin 12.0 - 15.0 g/dL 81.1   Hematocrit 91.4 - 46.0 % 37.4   Platelets 150 - 400 K/uL 183      Assessment and plan- Patient is a 82 y.o. female with pathological prognostic stage IIb invasive mammary carcinoma of the left breast pT2 N1a M0 ER/PR positive HER2 negative.  She is here for routine follow-up  Patient is currently receiving adjuvant radiation therapy ending in mid June 2024.  Patient will continue to hold letrozole until then.  Upon completion of radiation, she will resume letrozole.   Abnormal LFT's likely due to acute cholcystitis. LFTs have normalized presently. I will see her sometime in mid July with repeat LFTs and see how she tolerates letrozole.  Visit Diagnosis 1. Malignant neoplasm of areola of left breast in female, estrogen receptor positive (HCC)   2. Abnormal LFTs      Dr. Owens Shark, MD, MPH [facility] at [facility] [phone] 07/30/2022 11:09 AM

## 2022-07-31 ENCOUNTER — Other Ambulatory Visit: Payer: Self-pay

## 2022-07-31 ENCOUNTER — Ambulatory Visit
Admission: RE | Admit: 2022-07-31 | Discharge: 2022-07-31 | Disposition: A | Discharge status: Home / Self Care | Payer: Medicare HMO | Source: Ambulatory Visit | Attending: Radiation Oncology | Admitting: Radiation Oncology

## 2022-07-31 DIAGNOSIS — Z510 Encounter for antineoplastic radiation therapy: Secondary | ICD-10-CM | POA: Diagnosis not present

## 2022-07-31 DIAGNOSIS — C50012 Malignant neoplasm of nipple and areola, left female breast: Secondary | ICD-10-CM | POA: Diagnosis not present

## 2022-07-31 DIAGNOSIS — Z170 Estrogen receptor positive status [ER+]: Secondary | ICD-10-CM | POA: Diagnosis not present

## 2022-07-31 LAB — RAD ONC ARIA SESSION SUMMARY
Course Elapsed Days: 17
Plan Fractions Treated to Date: 14
Plan Prescribed Dose Per Fraction: 2 Gy
Plan Total Fractions Prescribed: 25
Plan Total Prescribed Dose: 50 Gy
Reference Point Dosage Given to Date: 28 Gy
Reference Point Session Dosage Given: 2 Gy
Session Number: 14

## 2022-08-04 ENCOUNTER — Inpatient Hospital Stay: Payer: Medicare HMO

## 2022-08-04 ENCOUNTER — Other Ambulatory Visit: Payer: Self-pay

## 2022-08-04 ENCOUNTER — Ambulatory Visit
Admission: RE | Admit: 2022-08-04 | Discharge: 2022-08-04 | Disposition: A | Discharge status: Home / Self Care | Payer: Medicare HMO | Source: Ambulatory Visit | Attending: Radiation Oncology | Admitting: Radiation Oncology

## 2022-08-04 DIAGNOSIS — Z170 Estrogen receptor positive status [ER+]: Secondary | ICD-10-CM | POA: Diagnosis not present

## 2022-08-04 DIAGNOSIS — C50012 Malignant neoplasm of nipple and areola, left female breast: Secondary | ICD-10-CM | POA: Diagnosis not present

## 2022-08-04 DIAGNOSIS — Z510 Encounter for antineoplastic radiation therapy: Secondary | ICD-10-CM | POA: Diagnosis not present

## 2022-08-04 LAB — RAD ONC ARIA SESSION SUMMARY
Course Elapsed Days: 21
Plan Fractions Treated to Date: 15
Plan Prescribed Dose Per Fraction: 2 Gy
Plan Total Fractions Prescribed: 25
Plan Total Prescribed Dose: 50 Gy
Reference Point Dosage Given to Date: 30 Gy
Reference Point Session Dosage Given: 2 Gy
Session Number: 15

## 2022-08-05 ENCOUNTER — Other Ambulatory Visit: Payer: Self-pay

## 2022-08-05 ENCOUNTER — Ambulatory Visit
Admission: RE | Admit: 2022-08-05 | Discharge: 2022-08-05 | Disposition: A | Discharge status: Home / Self Care | Payer: Medicare HMO | Source: Ambulatory Visit | Attending: Radiation Oncology | Admitting: Radiation Oncology

## 2022-08-05 DIAGNOSIS — C50012 Malignant neoplasm of nipple and areola, left female breast: Secondary | ICD-10-CM | POA: Diagnosis not present

## 2022-08-05 DIAGNOSIS — N1831 Chronic kidney disease, stage 3a: Secondary | ICD-10-CM | POA: Diagnosis not present

## 2022-08-05 DIAGNOSIS — E1022 Type 1 diabetes mellitus with diabetic chronic kidney disease: Secondary | ICD-10-CM | POA: Diagnosis not present

## 2022-08-05 DIAGNOSIS — Z170 Estrogen receptor positive status [ER+]: Secondary | ICD-10-CM | POA: Diagnosis not present

## 2022-08-05 DIAGNOSIS — Z510 Encounter for antineoplastic radiation therapy: Secondary | ICD-10-CM | POA: Diagnosis not present

## 2022-08-05 DIAGNOSIS — I129 Hypertensive chronic kidney disease with stage 1 through stage 4 chronic kidney disease, or unspecified chronic kidney disease: Secondary | ICD-10-CM | POA: Diagnosis not present

## 2022-08-05 DIAGNOSIS — Z48815 Encounter for surgical aftercare following surgery on the digestive system: Secondary | ICD-10-CM | POA: Diagnosis not present

## 2022-08-05 DIAGNOSIS — E1059 Type 1 diabetes mellitus with other circulatory complications: Secondary | ICD-10-CM | POA: Diagnosis not present

## 2022-08-05 DIAGNOSIS — F03C0 Unspecified dementia, severe, without behavioral disturbance, psychotic disturbance, mood disturbance, and anxiety: Secondary | ICD-10-CM | POA: Diagnosis not present

## 2022-08-05 DIAGNOSIS — M810 Age-related osteoporosis without current pathological fracture: Secondary | ICD-10-CM | POA: Diagnosis not present

## 2022-08-05 DIAGNOSIS — C773 Secondary and unspecified malignant neoplasm of axilla and upper limb lymph nodes: Secondary | ICD-10-CM | POA: Diagnosis not present

## 2022-08-05 LAB — RAD ONC ARIA SESSION SUMMARY
Course Elapsed Days: 22
Plan Fractions Treated to Date: 16
Plan Prescribed Dose Per Fraction: 2 Gy
Plan Total Fractions Prescribed: 25
Plan Total Prescribed Dose: 50 Gy
Reference Point Dosage Given to Date: 32 Gy
Reference Point Session Dosage Given: 2 Gy
Session Number: 16

## 2022-08-06 ENCOUNTER — Ambulatory Visit
Admission: RE | Admit: 2022-08-06 | Discharge: 2022-08-06 | Disposition: A | Discharge status: Home / Self Care | Payer: Medicare HMO | Source: Ambulatory Visit | Attending: Radiation Oncology | Admitting: Radiation Oncology

## 2022-08-06 ENCOUNTER — Other Ambulatory Visit: Payer: Self-pay

## 2022-08-06 DIAGNOSIS — Z510 Encounter for antineoplastic radiation therapy: Secondary | ICD-10-CM | POA: Diagnosis not present

## 2022-08-06 DIAGNOSIS — E1022 Type 1 diabetes mellitus with diabetic chronic kidney disease: Secondary | ICD-10-CM | POA: Diagnosis not present

## 2022-08-06 DIAGNOSIS — I129 Hypertensive chronic kidney disease with stage 1 through stage 4 chronic kidney disease, or unspecified chronic kidney disease: Secondary | ICD-10-CM | POA: Diagnosis not present

## 2022-08-06 DIAGNOSIS — F03C0 Unspecified dementia, severe, without behavioral disturbance, psychotic disturbance, mood disturbance, and anxiety: Secondary | ICD-10-CM | POA: Diagnosis not present

## 2022-08-06 DIAGNOSIS — C50012 Malignant neoplasm of nipple and areola, left female breast: Secondary | ICD-10-CM | POA: Diagnosis not present

## 2022-08-06 DIAGNOSIS — Z170 Estrogen receptor positive status [ER+]: Secondary | ICD-10-CM | POA: Diagnosis not present

## 2022-08-06 DIAGNOSIS — Z48815 Encounter for surgical aftercare following surgery on the digestive system: Secondary | ICD-10-CM | POA: Diagnosis not present

## 2022-08-06 DIAGNOSIS — N1831 Chronic kidney disease, stage 3a: Secondary | ICD-10-CM | POA: Diagnosis not present

## 2022-08-06 DIAGNOSIS — C773 Secondary and unspecified malignant neoplasm of axilla and upper limb lymph nodes: Secondary | ICD-10-CM | POA: Diagnosis not present

## 2022-08-06 DIAGNOSIS — M810 Age-related osteoporosis without current pathological fracture: Secondary | ICD-10-CM | POA: Diagnosis not present

## 2022-08-06 DIAGNOSIS — E1059 Type 1 diabetes mellitus with other circulatory complications: Secondary | ICD-10-CM | POA: Diagnosis not present

## 2022-08-06 LAB — RAD ONC ARIA SESSION SUMMARY
Course Elapsed Days: 23
Plan Fractions Treated to Date: 17
Plan Prescribed Dose Per Fraction: 2 Gy
Plan Total Fractions Prescribed: 25
Plan Total Prescribed Dose: 50 Gy
Reference Point Dosage Given to Date: 34 Gy
Reference Point Session Dosage Given: 2 Gy
Session Number: 17

## 2022-08-07 ENCOUNTER — Other Ambulatory Visit: Payer: Self-pay

## 2022-08-07 ENCOUNTER — Ambulatory Visit
Admission: RE | Admit: 2022-08-07 | Discharge: 2022-08-07 | Disposition: A | Discharge status: Home / Self Care | Payer: Medicare HMO | Source: Ambulatory Visit | Attending: Radiation Oncology | Admitting: Radiation Oncology

## 2022-08-07 DIAGNOSIS — Z170 Estrogen receptor positive status [ER+]: Secondary | ICD-10-CM | POA: Diagnosis not present

## 2022-08-07 DIAGNOSIS — Z510 Encounter for antineoplastic radiation therapy: Secondary | ICD-10-CM | POA: Diagnosis not present

## 2022-08-07 DIAGNOSIS — C50012 Malignant neoplasm of nipple and areola, left female breast: Secondary | ICD-10-CM | POA: Diagnosis not present

## 2022-08-07 LAB — RAD ONC ARIA SESSION SUMMARY
Course Elapsed Days: 24
Plan Fractions Treated to Date: 18
Plan Prescribed Dose Per Fraction: 2 Gy
Plan Total Fractions Prescribed: 25
Plan Total Prescribed Dose: 50 Gy
Reference Point Dosage Given to Date: 36 Gy
Reference Point Session Dosage Given: 2 Gy
Session Number: 18

## 2022-08-10 ENCOUNTER — Ambulatory Visit
Admission: RE | Admit: 2022-08-10 | Discharge: 2022-08-10 | Disposition: A | Discharge status: Home / Self Care | Payer: Medicare HMO | Source: Ambulatory Visit | Attending: Radiation Oncology | Admitting: Radiation Oncology

## 2022-08-10 ENCOUNTER — Other Ambulatory Visit: Payer: Self-pay

## 2022-08-10 DIAGNOSIS — Z170 Estrogen receptor positive status [ER+]: Secondary | ICD-10-CM | POA: Diagnosis not present

## 2022-08-10 DIAGNOSIS — C50012 Malignant neoplasm of nipple and areola, left female breast: Secondary | ICD-10-CM | POA: Insufficient documentation

## 2022-08-10 DIAGNOSIS — Z510 Encounter for antineoplastic radiation therapy: Secondary | ICD-10-CM | POA: Insufficient documentation

## 2022-08-10 LAB — RAD ONC ARIA SESSION SUMMARY
Course Elapsed Days: 27
Plan Fractions Treated to Date: 19
Plan Prescribed Dose Per Fraction: 2 Gy
Plan Total Fractions Prescribed: 25
Plan Total Prescribed Dose: 50 Gy
Reference Point Dosage Given to Date: 38 Gy
Reference Point Session Dosage Given: 2 Gy
Session Number: 19

## 2022-08-11 ENCOUNTER — Other Ambulatory Visit: Payer: Self-pay

## 2022-08-11 ENCOUNTER — Ambulatory Visit
Admission: RE | Admit: 2022-08-11 | Discharge: 2022-08-11 | Disposition: A | Discharge status: Home / Self Care | Payer: Medicare HMO | Source: Ambulatory Visit | Attending: Radiation Oncology | Admitting: Radiation Oncology

## 2022-08-11 DIAGNOSIS — E1022 Type 1 diabetes mellitus with diabetic chronic kidney disease: Secondary | ICD-10-CM | POA: Diagnosis not present

## 2022-08-11 DIAGNOSIS — F03C0 Unspecified dementia, severe, without behavioral disturbance, psychotic disturbance, mood disturbance, and anxiety: Secondary | ICD-10-CM | POA: Diagnosis not present

## 2022-08-11 DIAGNOSIS — M810 Age-related osteoporosis without current pathological fracture: Secondary | ICD-10-CM | POA: Diagnosis not present

## 2022-08-11 DIAGNOSIS — Z510 Encounter for antineoplastic radiation therapy: Secondary | ICD-10-CM | POA: Diagnosis not present

## 2022-08-11 DIAGNOSIS — C773 Secondary and unspecified malignant neoplasm of axilla and upper limb lymph nodes: Secondary | ICD-10-CM | POA: Diagnosis not present

## 2022-08-11 DIAGNOSIS — E1059 Type 1 diabetes mellitus with other circulatory complications: Secondary | ICD-10-CM | POA: Diagnosis not present

## 2022-08-11 DIAGNOSIS — C50012 Malignant neoplasm of nipple and areola, left female breast: Secondary | ICD-10-CM | POA: Diagnosis not present

## 2022-08-11 DIAGNOSIS — Z170 Estrogen receptor positive status [ER+]: Secondary | ICD-10-CM | POA: Diagnosis not present

## 2022-08-11 DIAGNOSIS — N1831 Chronic kidney disease, stage 3a: Secondary | ICD-10-CM | POA: Diagnosis not present

## 2022-08-11 DIAGNOSIS — I129 Hypertensive chronic kidney disease with stage 1 through stage 4 chronic kidney disease, or unspecified chronic kidney disease: Secondary | ICD-10-CM | POA: Diagnosis not present

## 2022-08-11 DIAGNOSIS — Z48815 Encounter for surgical aftercare following surgery on the digestive system: Secondary | ICD-10-CM | POA: Diagnosis not present

## 2022-08-11 LAB — RAD ONC ARIA SESSION SUMMARY
Course Elapsed Days: 28
Plan Fractions Treated to Date: 20
Plan Prescribed Dose Per Fraction: 2 Gy
Plan Total Fractions Prescribed: 25
Plan Total Prescribed Dose: 50 Gy
Reference Point Dosage Given to Date: 40 Gy
Reference Point Session Dosage Given: 2 Gy
Session Number: 20

## 2022-08-12 ENCOUNTER — Other Ambulatory Visit: Payer: Self-pay

## 2022-08-12 ENCOUNTER — Ambulatory Visit
Admission: RE | Admit: 2022-08-12 | Discharge: 2022-08-12 | Disposition: A | Discharge status: Home / Self Care | Payer: Medicare HMO | Source: Ambulatory Visit | Attending: Radiation Oncology | Admitting: Radiation Oncology

## 2022-08-12 DIAGNOSIS — Z510 Encounter for antineoplastic radiation therapy: Secondary | ICD-10-CM | POA: Diagnosis not present

## 2022-08-12 DIAGNOSIS — Z170 Estrogen receptor positive status [ER+]: Secondary | ICD-10-CM | POA: Diagnosis not present

## 2022-08-12 DIAGNOSIS — C50012 Malignant neoplasm of nipple and areola, left female breast: Secondary | ICD-10-CM | POA: Diagnosis not present

## 2022-08-12 LAB — RAD ONC ARIA SESSION SUMMARY
Course Elapsed Days: 29
Plan Fractions Treated to Date: 21
Plan Prescribed Dose Per Fraction: 2 Gy
Plan Total Fractions Prescribed: 25
Plan Total Prescribed Dose: 50 Gy
Reference Point Dosage Given to Date: 42 Gy
Reference Point Session Dosage Given: 2 Gy
Session Number: 21

## 2022-08-13 ENCOUNTER — Other Ambulatory Visit: Payer: Self-pay | Admitting: Family Medicine

## 2022-08-13 ENCOUNTER — Ambulatory Visit
Admission: RE | Admit: 2022-08-13 | Discharge: 2022-08-13 | Disposition: A | Discharge status: Home / Self Care | Payer: Medicare HMO | Source: Ambulatory Visit | Attending: Radiation Oncology | Admitting: Radiation Oncology

## 2022-08-13 ENCOUNTER — Other Ambulatory Visit: Payer: Self-pay

## 2022-08-13 DIAGNOSIS — Z510 Encounter for antineoplastic radiation therapy: Secondary | ICD-10-CM | POA: Diagnosis not present

## 2022-08-13 DIAGNOSIS — E785 Hyperlipidemia, unspecified: Secondary | ICD-10-CM

## 2022-08-13 DIAGNOSIS — C50012 Malignant neoplasm of nipple and areola, left female breast: Secondary | ICD-10-CM | POA: Diagnosis not present

## 2022-08-13 DIAGNOSIS — Z170 Estrogen receptor positive status [ER+]: Secondary | ICD-10-CM | POA: Diagnosis not present

## 2022-08-13 DIAGNOSIS — I700 Atherosclerosis of aorta: Secondary | ICD-10-CM

## 2022-08-13 LAB — RAD ONC ARIA SESSION SUMMARY
Course Elapsed Days: 30
Plan Fractions Treated to Date: 22
Plan Prescribed Dose Per Fraction: 2 Gy
Plan Total Fractions Prescribed: 25
Plan Total Prescribed Dose: 50 Gy
Reference Point Dosage Given to Date: 44 Gy
Reference Point Session Dosage Given: 2 Gy
Session Number: 22

## 2022-08-14 ENCOUNTER — Other Ambulatory Visit: Payer: Self-pay

## 2022-08-14 ENCOUNTER — Ambulatory Visit
Admission: RE | Admit: 2022-08-14 | Discharge: 2022-08-14 | Disposition: A | Discharge status: Home / Self Care | Payer: Medicare HMO | Source: Ambulatory Visit | Attending: Radiation Oncology | Admitting: Radiation Oncology

## 2022-08-14 DIAGNOSIS — Z170 Estrogen receptor positive status [ER+]: Secondary | ICD-10-CM | POA: Diagnosis not present

## 2022-08-14 DIAGNOSIS — Z510 Encounter for antineoplastic radiation therapy: Secondary | ICD-10-CM | POA: Diagnosis not present

## 2022-08-14 DIAGNOSIS — C50012 Malignant neoplasm of nipple and areola, left female breast: Secondary | ICD-10-CM | POA: Diagnosis not present

## 2022-08-14 LAB — RAD ONC ARIA SESSION SUMMARY
Course Elapsed Days: 31
Plan Fractions Treated to Date: 23
Plan Prescribed Dose Per Fraction: 2 Gy
Plan Total Fractions Prescribed: 25
Plan Total Prescribed Dose: 50 Gy
Reference Point Dosage Given to Date: 46 Gy
Reference Point Session Dosage Given: 2 Gy
Session Number: 23

## 2022-08-15 ENCOUNTER — Other Ambulatory Visit: Payer: Self-pay | Admitting: Family Medicine

## 2022-08-15 DIAGNOSIS — M810 Age-related osteoporosis without current pathological fracture: Secondary | ICD-10-CM

## 2022-08-17 ENCOUNTER — Ambulatory Visit
Admission: RE | Admit: 2022-08-17 | Discharge: 2022-08-17 | Disposition: A | Discharge status: Home / Self Care | Payer: Medicare HMO | Source: Ambulatory Visit | Attending: Radiation Oncology | Admitting: Radiation Oncology

## 2022-08-17 ENCOUNTER — Other Ambulatory Visit: Payer: Self-pay

## 2022-08-17 ENCOUNTER — Ambulatory Visit: Payer: Medicare HMO | Admitting: Family Medicine

## 2022-08-17 ENCOUNTER — Inpatient Hospital Stay: Payer: Medicare HMO

## 2022-08-17 DIAGNOSIS — Z510 Encounter for antineoplastic radiation therapy: Secondary | ICD-10-CM | POA: Diagnosis not present

## 2022-08-17 DIAGNOSIS — C50012 Malignant neoplasm of nipple and areola, left female breast: Secondary | ICD-10-CM | POA: Insufficient documentation

## 2022-08-17 DIAGNOSIS — Z170 Estrogen receptor positive status [ER+]: Secondary | ICD-10-CM | POA: Diagnosis not present

## 2022-08-17 LAB — RAD ONC ARIA SESSION SUMMARY
Course Elapsed Days: 34
Plan Fractions Treated to Date: 24
Plan Prescribed Dose Per Fraction: 2 Gy
Plan Total Fractions Prescribed: 25
Plan Total Prescribed Dose: 50 Gy
Reference Point Dosage Given to Date: 48 Gy
Reference Point Session Dosage Given: 2 Gy
Session Number: 24

## 2022-08-17 LAB — CBC (CANCER CENTER ONLY)
HCT: 37.6 % (ref 36.0–46.0)
Hemoglobin: 12.5 g/dL (ref 12.0–15.0)
MCH: 29.8 pg (ref 26.0–34.0)
MCHC: 33.2 g/dL (ref 30.0–36.0)
MCV: 89.5 fL (ref 80.0–100.0)
Platelet Count: 232 10*3/uL (ref 150–400)
RBC: 4.2 MIL/uL (ref 3.87–5.11)
RDW: 14.2 % (ref 11.5–15.5)
WBC Count: 4.8 10*3/uL (ref 4.0–10.5)
nRBC: 0 % (ref 0.0–0.2)

## 2022-08-18 ENCOUNTER — Ambulatory Visit
Admission: RE | Admit: 2022-08-18 | Discharge: 2022-08-18 | Disposition: A | Discharge status: Home / Self Care | Payer: Medicare HMO | Source: Ambulatory Visit | Attending: Radiation Oncology | Admitting: Radiation Oncology

## 2022-08-18 ENCOUNTER — Other Ambulatory Visit: Payer: Self-pay

## 2022-08-18 DIAGNOSIS — Z170 Estrogen receptor positive status [ER+]: Secondary | ICD-10-CM | POA: Diagnosis not present

## 2022-08-18 DIAGNOSIS — C50012 Malignant neoplasm of nipple and areola, left female breast: Secondary | ICD-10-CM | POA: Diagnosis not present

## 2022-08-18 DIAGNOSIS — Z510 Encounter for antineoplastic radiation therapy: Secondary | ICD-10-CM | POA: Diagnosis not present

## 2022-08-18 LAB — RAD ONC ARIA SESSION SUMMARY
Course Elapsed Days: 35
Plan Fractions Treated to Date: 25
Plan Prescribed Dose Per Fraction: 2 Gy
Plan Total Fractions Prescribed: 25
Plan Total Prescribed Dose: 50 Gy
Reference Point Dosage Given to Date: 50 Gy
Reference Point Session Dosage Given: 2 Gy
Session Number: 25

## 2022-08-19 ENCOUNTER — Encounter: Payer: Self-pay | Admitting: *Deleted

## 2022-08-19 DIAGNOSIS — N1831 Chronic kidney disease, stage 3a: Secondary | ICD-10-CM | POA: Diagnosis not present

## 2022-08-19 DIAGNOSIS — F03C0 Unspecified dementia, severe, without behavioral disturbance, psychotic disturbance, mood disturbance, and anxiety: Secondary | ICD-10-CM | POA: Diagnosis not present

## 2022-08-19 DIAGNOSIS — I129 Hypertensive chronic kidney disease with stage 1 through stage 4 chronic kidney disease, or unspecified chronic kidney disease: Secondary | ICD-10-CM | POA: Diagnosis not present

## 2022-08-19 DIAGNOSIS — M810 Age-related osteoporosis without current pathological fracture: Secondary | ICD-10-CM | POA: Diagnosis not present

## 2022-08-19 DIAGNOSIS — C50012 Malignant neoplasm of nipple and areola, left female breast: Secondary | ICD-10-CM | POA: Diagnosis not present

## 2022-08-19 DIAGNOSIS — E1022 Type 1 diabetes mellitus with diabetic chronic kidney disease: Secondary | ICD-10-CM | POA: Diagnosis not present

## 2022-08-19 DIAGNOSIS — Z48815 Encounter for surgical aftercare following surgery on the digestive system: Secondary | ICD-10-CM | POA: Diagnosis not present

## 2022-08-19 DIAGNOSIS — C773 Secondary and unspecified malignant neoplasm of axilla and upper limb lymph nodes: Secondary | ICD-10-CM | POA: Diagnosis not present

## 2022-08-19 DIAGNOSIS — E1059 Type 1 diabetes mellitus with other circulatory complications: Secondary | ICD-10-CM | POA: Diagnosis not present

## 2022-08-21 ENCOUNTER — Other Ambulatory Visit: Payer: Self-pay | Admitting: Family Medicine

## 2022-08-21 DIAGNOSIS — N1831 Chronic kidney disease, stage 3a: Secondary | ICD-10-CM

## 2022-08-21 DIAGNOSIS — I152 Hypertension secondary to endocrine disorders: Secondary | ICD-10-CM

## 2022-08-25 DIAGNOSIS — C773 Secondary and unspecified malignant neoplasm of axilla and upper limb lymph nodes: Secondary | ICD-10-CM | POA: Diagnosis not present

## 2022-08-25 DIAGNOSIS — N1831 Chronic kidney disease, stage 3a: Secondary | ICD-10-CM | POA: Diagnosis not present

## 2022-08-25 DIAGNOSIS — M810 Age-related osteoporosis without current pathological fracture: Secondary | ICD-10-CM | POA: Diagnosis not present

## 2022-08-25 DIAGNOSIS — Z48815 Encounter for surgical aftercare following surgery on the digestive system: Secondary | ICD-10-CM | POA: Diagnosis not present

## 2022-08-25 DIAGNOSIS — E1022 Type 1 diabetes mellitus with diabetic chronic kidney disease: Secondary | ICD-10-CM | POA: Diagnosis not present

## 2022-08-25 DIAGNOSIS — C50012 Malignant neoplasm of nipple and areola, left female breast: Secondary | ICD-10-CM | POA: Diagnosis not present

## 2022-08-25 DIAGNOSIS — F03C0 Unspecified dementia, severe, without behavioral disturbance, psychotic disturbance, mood disturbance, and anxiety: Secondary | ICD-10-CM | POA: Diagnosis not present

## 2022-08-25 DIAGNOSIS — I129 Hypertensive chronic kidney disease with stage 1 through stage 4 chronic kidney disease, or unspecified chronic kidney disease: Secondary | ICD-10-CM | POA: Diagnosis not present

## 2022-08-25 DIAGNOSIS — E1059 Type 1 diabetes mellitus with other circulatory complications: Secondary | ICD-10-CM | POA: Diagnosis not present

## 2022-08-26 DIAGNOSIS — N1831 Chronic kidney disease, stage 3a: Secondary | ICD-10-CM | POA: Diagnosis not present

## 2022-08-26 DIAGNOSIS — I129 Hypertensive chronic kidney disease with stage 1 through stage 4 chronic kidney disease, or unspecified chronic kidney disease: Secondary | ICD-10-CM | POA: Diagnosis not present

## 2022-08-26 DIAGNOSIS — F03C0 Unspecified dementia, severe, without behavioral disturbance, psychotic disturbance, mood disturbance, and anxiety: Secondary | ICD-10-CM | POA: Diagnosis not present

## 2022-08-26 DIAGNOSIS — E1022 Type 1 diabetes mellitus with diabetic chronic kidney disease: Secondary | ICD-10-CM | POA: Diagnosis not present

## 2022-08-26 DIAGNOSIS — C50012 Malignant neoplasm of nipple and areola, left female breast: Secondary | ICD-10-CM | POA: Diagnosis not present

## 2022-08-26 DIAGNOSIS — Z48815 Encounter for surgical aftercare following surgery on the digestive system: Secondary | ICD-10-CM | POA: Diagnosis not present

## 2022-08-26 DIAGNOSIS — C773 Secondary and unspecified malignant neoplasm of axilla and upper limb lymph nodes: Secondary | ICD-10-CM | POA: Diagnosis not present

## 2022-08-26 DIAGNOSIS — M810 Age-related osteoporosis without current pathological fracture: Secondary | ICD-10-CM | POA: Diagnosis not present

## 2022-08-26 DIAGNOSIS — E1059 Type 1 diabetes mellitus with other circulatory complications: Secondary | ICD-10-CM | POA: Diagnosis not present

## 2022-09-01 DIAGNOSIS — Z48815 Encounter for surgical aftercare following surgery on the digestive system: Secondary | ICD-10-CM | POA: Diagnosis not present

## 2022-09-01 DIAGNOSIS — M810 Age-related osteoporosis without current pathological fracture: Secondary | ICD-10-CM | POA: Diagnosis not present

## 2022-09-01 DIAGNOSIS — C50012 Malignant neoplasm of nipple and areola, left female breast: Secondary | ICD-10-CM | POA: Diagnosis not present

## 2022-09-01 DIAGNOSIS — N1831 Chronic kidney disease, stage 3a: Secondary | ICD-10-CM | POA: Diagnosis not present

## 2022-09-01 DIAGNOSIS — C773 Secondary and unspecified malignant neoplasm of axilla and upper limb lymph nodes: Secondary | ICD-10-CM | POA: Diagnosis not present

## 2022-09-01 DIAGNOSIS — I129 Hypertensive chronic kidney disease with stage 1 through stage 4 chronic kidney disease, or unspecified chronic kidney disease: Secondary | ICD-10-CM | POA: Diagnosis not present

## 2022-09-01 DIAGNOSIS — F03C0 Unspecified dementia, severe, without behavioral disturbance, psychotic disturbance, mood disturbance, and anxiety: Secondary | ICD-10-CM | POA: Diagnosis not present

## 2022-09-01 DIAGNOSIS — E1059 Type 1 diabetes mellitus with other circulatory complications: Secondary | ICD-10-CM | POA: Diagnosis not present

## 2022-09-01 DIAGNOSIS — E1022 Type 1 diabetes mellitus with diabetic chronic kidney disease: Secondary | ICD-10-CM | POA: Diagnosis not present

## 2022-09-03 DIAGNOSIS — C50012 Malignant neoplasm of nipple and areola, left female breast: Secondary | ICD-10-CM | POA: Diagnosis not present

## 2022-09-03 DIAGNOSIS — Z48815 Encounter for surgical aftercare following surgery on the digestive system: Secondary | ICD-10-CM | POA: Diagnosis not present

## 2022-09-03 DIAGNOSIS — N1831 Chronic kidney disease, stage 3a: Secondary | ICD-10-CM | POA: Diagnosis not present

## 2022-09-03 DIAGNOSIS — F03C0 Unspecified dementia, severe, without behavioral disturbance, psychotic disturbance, mood disturbance, and anxiety: Secondary | ICD-10-CM | POA: Diagnosis not present

## 2022-09-03 DIAGNOSIS — C773 Secondary and unspecified malignant neoplasm of axilla and upper limb lymph nodes: Secondary | ICD-10-CM | POA: Diagnosis not present

## 2022-09-03 DIAGNOSIS — M810 Age-related osteoporosis without current pathological fracture: Secondary | ICD-10-CM | POA: Diagnosis not present

## 2022-09-03 DIAGNOSIS — E1059 Type 1 diabetes mellitus with other circulatory complications: Secondary | ICD-10-CM | POA: Diagnosis not present

## 2022-09-03 DIAGNOSIS — E1022 Type 1 diabetes mellitus with diabetic chronic kidney disease: Secondary | ICD-10-CM | POA: Diagnosis not present

## 2022-09-03 DIAGNOSIS — I129 Hypertensive chronic kidney disease with stage 1 through stage 4 chronic kidney disease, or unspecified chronic kidney disease: Secondary | ICD-10-CM | POA: Diagnosis not present

## 2022-09-04 ENCOUNTER — Other Ambulatory Visit: Payer: Self-pay | Admitting: Family Medicine

## 2022-09-04 DIAGNOSIS — I700 Atherosclerosis of aorta: Secondary | ICD-10-CM

## 2022-09-04 DIAGNOSIS — E785 Hyperlipidemia, unspecified: Secondary | ICD-10-CM

## 2022-09-07 DIAGNOSIS — F03C0 Unspecified dementia, severe, without behavioral disturbance, psychotic disturbance, mood disturbance, and anxiety: Secondary | ICD-10-CM | POA: Diagnosis not present

## 2022-09-07 DIAGNOSIS — C50012 Malignant neoplasm of nipple and areola, left female breast: Secondary | ICD-10-CM | POA: Diagnosis not present

## 2022-09-07 DIAGNOSIS — C773 Secondary and unspecified malignant neoplasm of axilla and upper limb lymph nodes: Secondary | ICD-10-CM | POA: Diagnosis not present

## 2022-09-07 DIAGNOSIS — M810 Age-related osteoporosis without current pathological fracture: Secondary | ICD-10-CM | POA: Diagnosis not present

## 2022-09-07 DIAGNOSIS — E1059 Type 1 diabetes mellitus with other circulatory complications: Secondary | ICD-10-CM | POA: Diagnosis not present

## 2022-09-07 DIAGNOSIS — Z48815 Encounter for surgical aftercare following surgery on the digestive system: Secondary | ICD-10-CM | POA: Diagnosis not present

## 2022-09-07 DIAGNOSIS — I129 Hypertensive chronic kidney disease with stage 1 through stage 4 chronic kidney disease, or unspecified chronic kidney disease: Secondary | ICD-10-CM | POA: Diagnosis not present

## 2022-09-07 DIAGNOSIS — N1831 Chronic kidney disease, stage 3a: Secondary | ICD-10-CM | POA: Diagnosis not present

## 2022-09-07 DIAGNOSIS — E1022 Type 1 diabetes mellitus with diabetic chronic kidney disease: Secondary | ICD-10-CM | POA: Diagnosis not present

## 2022-09-08 ENCOUNTER — Telehealth: Payer: Self-pay | Admitting: Family Medicine

## 2022-09-08 ENCOUNTER — Other Ambulatory Visit: Payer: Self-pay | Admitting: Family Medicine

## 2022-09-08 DIAGNOSIS — E1022 Type 1 diabetes mellitus with diabetic chronic kidney disease: Secondary | ICD-10-CM | POA: Diagnosis not present

## 2022-09-08 DIAGNOSIS — I129 Hypertensive chronic kidney disease with stage 1 through stage 4 chronic kidney disease, or unspecified chronic kidney disease: Secondary | ICD-10-CM | POA: Diagnosis not present

## 2022-09-08 DIAGNOSIS — C50012 Malignant neoplasm of nipple and areola, left female breast: Secondary | ICD-10-CM | POA: Diagnosis not present

## 2022-09-08 DIAGNOSIS — C773 Secondary and unspecified malignant neoplasm of axilla and upper limb lymph nodes: Secondary | ICD-10-CM | POA: Diagnosis not present

## 2022-09-08 DIAGNOSIS — F03C0 Unspecified dementia, severe, without behavioral disturbance, psychotic disturbance, mood disturbance, and anxiety: Secondary | ICD-10-CM | POA: Diagnosis not present

## 2022-09-08 DIAGNOSIS — E785 Hyperlipidemia, unspecified: Secondary | ICD-10-CM

## 2022-09-08 DIAGNOSIS — E1059 Type 1 diabetes mellitus with other circulatory complications: Secondary | ICD-10-CM | POA: Diagnosis not present

## 2022-09-08 DIAGNOSIS — Z48815 Encounter for surgical aftercare following surgery on the digestive system: Secondary | ICD-10-CM | POA: Diagnosis not present

## 2022-09-08 DIAGNOSIS — I700 Atherosclerosis of aorta: Secondary | ICD-10-CM

## 2022-09-08 DIAGNOSIS — M810 Age-related osteoporosis without current pathological fracture: Secondary | ICD-10-CM | POA: Diagnosis not present

## 2022-09-08 DIAGNOSIS — N1831 Chronic kidney disease, stage 3a: Secondary | ICD-10-CM | POA: Diagnosis not present

## 2022-09-08 NOTE — Telephone Encounter (Signed)
Pt medication was d/c on 06/28/22 while being discharged from Hospital. Please advice

## 2022-09-08 NOTE — Telephone Encounter (Signed)
atorvastatin (LIPITOR) 80 MG tablet , Not on current medication list. Routing for approval.

## 2022-09-08 NOTE — Telephone Encounter (Signed)
Medication Refill - Medication: atorvastatin (LIPITOR) 80 MG tablet   Has the patient contacted their pharmacy? Yes.    (Agent: If yes, when and what did the pharmacy advise?) Contact PCP   Preferred Pharmacy (with phone number or street name):  [id number], [place] - [address]    Has the patient been seen for an appointment in the last year OR does the patient have an upcoming appointment? Yes.    Agent: Please be advised that RX refills may take up to 3 business days. We ask that you follow-up with your pharmacy.

## 2022-09-09 NOTE — Telephone Encounter (Signed)
Patient called/daughter/caregiver aware

## 2022-09-14 DIAGNOSIS — M810 Age-related osteoporosis without current pathological fracture: Secondary | ICD-10-CM | POA: Diagnosis not present

## 2022-09-14 DIAGNOSIS — E1059 Type 1 diabetes mellitus with other circulatory complications: Secondary | ICD-10-CM | POA: Diagnosis not present

## 2022-09-14 DIAGNOSIS — E1022 Type 1 diabetes mellitus with diabetic chronic kidney disease: Secondary | ICD-10-CM | POA: Diagnosis not present

## 2022-09-14 DIAGNOSIS — F03C0 Unspecified dementia, severe, without behavioral disturbance, psychotic disturbance, mood disturbance, and anxiety: Secondary | ICD-10-CM | POA: Diagnosis not present

## 2022-09-14 DIAGNOSIS — C773 Secondary and unspecified malignant neoplasm of axilla and upper limb lymph nodes: Secondary | ICD-10-CM | POA: Diagnosis not present

## 2022-09-14 DIAGNOSIS — N1831 Chronic kidney disease, stage 3a: Secondary | ICD-10-CM | POA: Diagnosis not present

## 2022-09-14 DIAGNOSIS — I129 Hypertensive chronic kidney disease with stage 1 through stage 4 chronic kidney disease, or unspecified chronic kidney disease: Secondary | ICD-10-CM | POA: Diagnosis not present

## 2022-09-14 DIAGNOSIS — C50012 Malignant neoplasm of nipple and areola, left female breast: Secondary | ICD-10-CM | POA: Diagnosis not present

## 2022-09-14 DIAGNOSIS — Z48815 Encounter for surgical aftercare following surgery on the digestive system: Secondary | ICD-10-CM | POA: Diagnosis not present

## 2022-09-15 DIAGNOSIS — C773 Secondary and unspecified malignant neoplasm of axilla and upper limb lymph nodes: Secondary | ICD-10-CM | POA: Diagnosis not present

## 2022-09-15 DIAGNOSIS — F03C0 Unspecified dementia, severe, without behavioral disturbance, psychotic disturbance, mood disturbance, and anxiety: Secondary | ICD-10-CM | POA: Diagnosis not present

## 2022-09-15 DIAGNOSIS — M810 Age-related osteoporosis without current pathological fracture: Secondary | ICD-10-CM | POA: Diagnosis not present

## 2022-09-15 DIAGNOSIS — I129 Hypertensive chronic kidney disease with stage 1 through stage 4 chronic kidney disease, or unspecified chronic kidney disease: Secondary | ICD-10-CM | POA: Diagnosis not present

## 2022-09-15 DIAGNOSIS — E1022 Type 1 diabetes mellitus with diabetic chronic kidney disease: Secondary | ICD-10-CM | POA: Diagnosis not present

## 2022-09-15 DIAGNOSIS — E1059 Type 1 diabetes mellitus with other circulatory complications: Secondary | ICD-10-CM | POA: Diagnosis not present

## 2022-09-15 DIAGNOSIS — C50012 Malignant neoplasm of nipple and areola, left female breast: Secondary | ICD-10-CM | POA: Diagnosis not present

## 2022-09-15 DIAGNOSIS — Z48815 Encounter for surgical aftercare following surgery on the digestive system: Secondary | ICD-10-CM | POA: Diagnosis not present

## 2022-09-15 DIAGNOSIS — N1831 Chronic kidney disease, stage 3a: Secondary | ICD-10-CM | POA: Diagnosis not present

## 2022-09-16 ENCOUNTER — Encounter: Payer: Self-pay | Admitting: Radiation Oncology

## 2022-09-16 ENCOUNTER — Ambulatory Visit
Admission: RE | Admit: 2022-09-16 | Discharge: 2022-09-16 | Disposition: A | Discharge status: Home / Self Care | Payer: Medicare HMO | Source: Ambulatory Visit | Attending: Radiation Oncology | Admitting: Radiation Oncology

## 2022-09-16 ENCOUNTER — Encounter: Payer: Self-pay | Admitting: Oncology

## 2022-09-16 ENCOUNTER — Inpatient Hospital Stay (HOSPITAL_BASED_OUTPATIENT_CLINIC_OR_DEPARTMENT_OTHER): Payer: Medicare HMO | Admitting: Oncology

## 2022-09-16 ENCOUNTER — Inpatient Hospital Stay: Payer: Medicare HMO | Attending: Oncology

## 2022-09-16 VITALS — BP 122/66 | HR 65 | Temp 97.6°F | Ht 62.0 in | Wt 130.6 lb

## 2022-09-16 DIAGNOSIS — C50012 Malignant neoplasm of nipple and areola, left female breast: Secondary | ICD-10-CM

## 2022-09-16 DIAGNOSIS — C50412 Malignant neoplasm of upper-outer quadrant of left female breast: Secondary | ICD-10-CM | POA: Insufficient documentation

## 2022-09-16 DIAGNOSIS — Z170 Estrogen receptor positive status [ER+]: Secondary | ICD-10-CM | POA: Insufficient documentation

## 2022-09-16 DIAGNOSIS — Z853 Personal history of malignant neoplasm of breast: Secondary | ICD-10-CM | POA: Diagnosis not present

## 2022-09-16 DIAGNOSIS — Z923 Personal history of irradiation: Secondary | ICD-10-CM | POA: Diagnosis not present

## 2022-09-16 DIAGNOSIS — Z79811 Long term (current) use of aromatase inhibitors: Secondary | ICD-10-CM | POA: Diagnosis not present

## 2022-09-16 DIAGNOSIS — Z08 Encounter for follow-up examination after completed treatment for malignant neoplasm: Secondary | ICD-10-CM

## 2022-09-16 LAB — COMPREHENSIVE METABOLIC PANEL
ALT: 17 U/L (ref 0–44)
AST: 21 U/L (ref 15–41)
Albumin: 3.5 g/dL (ref 3.5–5.0)
Alkaline Phosphatase: 56 U/L (ref 38–126)
Anion gap: 9 (ref 5–15)
BUN: 16 mg/dL (ref 8–23)
CO2: 26 mmol/L (ref 22–32)
Calcium: 8.7 mg/dL — ABNORMAL LOW (ref 8.9–10.3)
Chloride: 101 mmol/L (ref 98–111)
Creatinine, Ser: 0.73 mg/dL (ref 0.44–1.00)
GFR, Estimated: >60 mL/min (ref >60)
Glucose, Bld: 227 mg/dL — ABNORMAL HIGH (ref 70–99)
Potassium: 3.7 mmol/L (ref 3.5–5.1)
Sodium: 136 mmol/L (ref 135–145)
Total Bilirubin: 0.4 mg/dL (ref 0.3–1.2)
Total Protein: 6.9 g/dL (ref 6.5–8.1)

## 2022-09-16 NOTE — Progress Notes (Signed)
Radiation Oncology Follow up Note  Name: [name]   Date:   09/16/2022 MRN:  [id number] DOB: 1941/01/26    This 82 y.o. female presents to the clinic today for 1 month follow-up status post.  Left chest wall and peripheral lymphatic radiation therapy and patient with known stage IIb invasive mammary carcinoma with dermal involvement and multiple axillary lymph node involvement with dementia  REFERRING PROVIDER: Alba Cory, MD  HPI: Patient is a 82 year old female now out 1 month having completed left chest wall and peripheral lymphatic radiation therapy for stage IIb invasive mammary carcinoma.  Seen today in routine follow-up she is doing well.  She is without complaint.  Specifically denies any chest wall pain any swelling in her left upper extremity cough or bone pain..  COMPLICATIONS OF TREATMENT: none  FOLLOW UP COMPLIANCE: keeps appointments   PHYSICAL EXAM:  There were no vitals taken for this visit. Patient is wheelchair-bound and frail.  She is status post left modified radical mastectomy chest wall is clear without evidence of nodularity or mass.  Right breast is free of mass no axillary or supraclavicular adenopathy is identified.  Well-developed well-nourished patient in NAD. HEENT reveals PERLA, EOMI, discs not visualized.  Oral cavity is clear. No oral mucosal lesions are identified. Neck is clear without evidence of cervical or supraclavicular adenopathy. Lungs are clear to A&P. Cardiac examination is essentially unremarkable with regular rate and rhythm without murmur rub or thrill. Abdomen is benign with no organomegaly or masses noted. Motor sensory and DTR levels are equal and symmetric in the upper and lower extremities. Cranial nerves II through XII are grossly intact. Proprioception is intact. No peripheral adenopathy or edema is identified. No motor or sensory levels are noted. Crude visual fields are within normal range.  RADIOLOGY RESULTS: No current films for  review  PLAN: Present time I will turn follow-up care over to medical oncology.  Patient has transportation issues as well as her dementia makes her office visits difficult.  Patient and family know to call at anytime with any concerns.  I would like to take this opportunity to thank you for allowing me to participate in the care of your patient.Carmina Miller, MD

## 2022-09-16 NOTE — Progress Notes (Signed)
Hematology/Oncology Consult note [facility]  [phone] [phone]  Patient Care Team: Alba Cory, MD as PCP - General (Family Medicine) [place] Crane, MD as Consulting Physician (Ophthalmology) Sherlon Handing, MD as Consulting Physician (Internal Medicine) Hulen Luster, RN as Oncology Nurse Navigator   Name of the patient: [name]  [id number]  Jan 30, 1941   Date of visit: 09/16/22  Diagnosis- pathological prognostic stage IIa invasive mammary carcinoma of the left breast pT2 N1a M0 ER/PR positive HER2 negative    Chief complaint/ Reason for visit-routine follow-up of breast cancer  Heme/Onc history: Patient is a 82 year old female who underwent a bilateral diagnostic mammogram for palpable mass in the left breast in January 2024.  Mammogram showed hypoechoic mass measuring 3.1 x 1.9 x 3.4 cm in the left retroareolar breast at 2 o'clock position with invasion into the dermis and retroareolar breast.  2 enlarged left axillary lymph nodes.  No mammographic evidence of malignancy in the right breast.  Patient had a left breast biopsy as well as biopsy of the left axillary lymph which showed invasive mammary carcinoma grade 310 mm in the primary breast specimen and 7 mm in the lymph node.  Tumor was ER greater than 90% positive, PR 60% positive and HER2 negative.   Patient is here with her daughter today who is her healthcare power of attorney.  Patient has advanced dementia and is dependent on her family members for ADLs including dressing and ambulation to some extent.  She is incontinent.  She has not had any recent infections or hospitalizations.   Final pathology showed 3.2 cm grade 3 invasive mammary carcinoma with negative margins.  3 out of 15 lymph nodes were positive for malignancy.   Patient had started letrozole briefly but was held due to abnormal LFTs which were noticed at the time of her cholecystitis.  She  completed adjuvant radiation and restarted letrozole  Interval history- Patient is able to verbalize better and denies any specific complaints today  ECOG PS- 2 Pain scale- 0   Review of systems- Review of Systems  Unable to perform ROS: Dementia  Constitutional:  Positive for malaise/fatigue. Negative for chills, fever and weight loss.  HENT:  Negative for congestion, ear discharge and nosebleeds.   Eyes:  Negative for blurred vision.  Respiratory:  Negative for cough, hemoptysis, sputum production, shortness of breath and wheezing.   Cardiovascular:  Negative for chest pain, palpitations, orthopnea and claudication.  Gastrointestinal:  Negative for abdominal pain, blood in stool, constipation, diarrhea, heartburn, melena, nausea and vomiting.  Genitourinary:  Negative for dysuria, flank pain, frequency, hematuria and urgency.  Musculoskeletal:  Negative for back pain, joint pain and myalgias.  Skin:  Negative for rash.  Neurological:  Negative for dizziness, tingling, focal weakness, seizures, weakness and headaches.  Endo/Heme/Allergies:  Does not bruise/bleed easily.  Psychiatric/Behavioral:  Negative for depression and suicidal ideas. The patient does not have insomnia.       Allergies  Allergen Reactions   Colesevelam Hcl     scotomas   Daucus Carota    Penicillins Itching     Past Medical History:  Diagnosis Date   Arthritis    Atherosclerosis of aorta (HCC)    CKD (chronic kidney disease) stage 3, GFR 30-59 ml/min (HCC)    Dementia (HCC)    Diabetes mellitus without complication (HCC)    Dysrhythmia    Heart murmur    Hypertension    Type 1 diabetes (HCC)  Past Surgical History:  Procedure Laterality Date   AXILLARY LYMPH NODE BIOPSY Left 04/07/2022   bx done, hydromarker placed, path pending   BREAST BIOPSY Left 04/07/2022   [place] bx mass, ribbon marker, path pending   BREAST BIOPSY Left 04/07/2022   [place] LT BREAST BX W LOC DEV 1ST LESION IMG BX SPEC US  GUIDE 04/07/2022 [facility]   BREAST EXCISIONAL BIOPSY Left    neg   BREAST SURGERY  1970   EYE SURGERY     detached retina   MASTECTOMY MODIFIED RADICAL Left 05/25/2022   Procedure: MASTECTOMY MODIFIED RADICAL;  Surgeon: Carolan Shiver, MD;  Location: [facility] ORS;  Service: General;  Laterality: Left;    Social History   Socioeconomic History   Marital status: Married    Spouse name: Sherilyn Cooter   Number of children: 3   Years of education: Not on file   Highest education level: 12th grade  Occupational History   Occupation: Retired  Tobacco Use   Smoking status: Never   Smokeless tobacco: Never   Tobacco comments:    smoking cessation materials not required  Vaping Use   Vaping Use: Never used  Substance and Sexual Activity   Alcohol use: No    Alcohol/week: 0.0 standard drinks of alcohol   Drug use: No   Sexual activity: Not Currently  Other Topics Concern   Not on file  Social History Narrative   Not on file   Social Determinants of Health   Financial Resource Strain: Low Risk  (05/14/2022)   Overall Financial Resource Strain (CARDIA)    Difficulty of Paying Living Expenses: Not very hard  Food Insecurity: No Food Insecurity (07/02/2022)   Hunger Vital Sign    Worried About Running Out of Food in the Last Year: Never true    Ran Out of Food in the Last Year: Never true  Transportation Needs: No Transportation Needs (07/02/2022)   PRAPARE - Administrator, Civil Service (Medical): No    Lack of Transportation (Non-Medical): No  Physical Activity: Inactive (05/14/2022)   Exercise Vital Sign    Days of Exercise per Week: 0 days    Minutes of Exercise per Session: 0 min  Stress: No Stress Concern Present (05/14/2022)   [employer] of Occupational Health - Occupational Stress Questionnaire    Feeling of Stress : Only a little  Social Connections: Socially Integrated (05/14/2022)   Social Connection and Isolation Panel [NHANES]    Frequency of  Communication with Friends and Family: Never    Frequency of Social Gatherings with Friends and Family: More than three times a week    Attends Religious Services: 1 to 4 times per year    Active Member of [employer] or Organizations: Yes    Attends Banker Meetings: Never    Marital Status: Married  Catering manager Violence: Not At Risk (06/28/2022)   Humiliation, Afraid, Rape, and Kick questionnaire    Fear of Current or Ex-Partner: No    Emotionally Abused: No    Physically Abused: No    Sexually Abused: No    Family History  Problem Relation Age of Onset   Alzheimer's disease Mother    Diabetes Mother    Arthritis Mother    Diabetes Father    Hypertension Father    Hyperlipidemia Father    Stroke Brother    Multiple sclerosis Daughter      Current Outpatient Medications:    Accu-Chek Softclix Lancets lancets, , Disp: , Rfl:  alendronate (FOSAMAX) 70 MG tablet, TAKE WITH A FULL GLASS OF WATER ON AN EMPTY STOMACH., Disp: 12 tablet, Rfl: 1   amLODipine (NORVASC) 5 MG tablet, Take 1 tablet (5 mg total) by mouth daily., Disp: 90 tablet, Rfl: 1   blood glucose meter kit and supplies, , Disp: , Rfl:    Calcium Carb-Cholecalciferol 600-10 MG-MCG TABS, Take 1 tablet by mouth 2 (two) times daily., Disp: , Rfl:    [employer] ALCOHOL SWABS PADS, USE 4 TIMES DAILY TO WIPE SKIN BEFORE USING INSULIN, Disp: 400 each, Rfl: 2   donepezil (ARICEPT ODT) 10 MG disintegrating tablet, TAKE 1 TABLET BY MOUTH EVERYDAY AT BEDTIME (Patient taking differently: Take 10 mg by mouth at bedtime. TAKE 1 TABLET BY MOUTH EVERYDAY AT BEDTIME), Disp: 90 tablet, Rfl: 1   Glucagon (BAQSIMI ONE PACK) 3 MG/DOSE POWD, , Disp: , Rfl:    insulin aspart (NOVOLOG) 100 UNIT/ML FlexPen, Inject 6-10 Units into the skin 3 (three) times daily with meals., Disp: , Rfl:    insulin glargine-yfgn (SEMGLEE) 100 UNIT/ML injection, Inject 0.05 mLs (5 Units total) into the skin daily., Disp: 10 mL, Rfl: 11   letrozole (FEMARA)  2.5 MG tablet, TAKE 1 TABLET BY MOUTH EVERY DAY, Disp: 90 tablet, Rfl: 1   losartan-hydrochlorothiazide (HYZAAR) 100-12.5 MG tablet, TAKE 1 TABLET BY MOUTH EVERY DAY, Disp: 30 tablet, Rfl: 0   polyethylene glycol (MIRALAX / GLYCOLAX) 17 g packet, Take 17 g by mouth daily., Disp: 14 each, Rfl: 0   traMADol (ULTRAM) 50 MG tablet, Take 1 tablet (50 mg total) by mouth every 6 (six) hours as needed., Disp: 20 tablet, Rfl: 0  Physical exam:  Vitals:   09/16/22 1318  Height: 5\' 2"  (1.575 m)   Physical Exam Constitutional:      Comments: Sitting in a wheelchair. Appears in no acute distress  Cardiovascular:     Rate and Rhythm: Normal rate and regular rhythm.     Heart sounds: Normal heart sounds.  Pulmonary:     Effort: Pulmonary effort is normal.     Breath sounds: Normal breath sounds.  Abdominal:     General: Bowel sounds are normal.     Palpations: Abdomen is soft.  Skin:    General: Skin is warm and dry.  Neurological:     Mental Status: She is alert and oriented to person, place, and time.         Latest Ref Rng & Units 07/29/2022   12:43 PM  CMP  Glucose 70 - 99 mg/dL 91   BUN 8 - 23 mg/dL 16   Creatinine 4.01 - 1.00 mg/dL 0.27   Sodium 253 - 664 mmol/L 139   Potassium 3.5 - 5.1 mmol/L 3.5   Chloride 98 - 111 mmol/L 102   CO2 22 - 32 mmol/L 27   Calcium 8.9 - 10.3 mg/dL 9.5   Total Protein 6.5 - 8.1 g/dL 7.6   Total Bilirubin 0.3 - 1.2 mg/dL 0.6   Alkaline Phos 38 - 126 U/L 91   AST 15 - 41 U/L 29   ALT 0 - 44 U/L 24       Latest Ref Rng & Units 08/17/2022    1:33 PM  CBC  WBC 4.0 - 10.5 K/uL 4.8   Hemoglobin 12.0 - 15.0 g/dL 40.3   Hematocrit 47.4 - 46.0 % 37.6   Platelets 150 - 400 K/uL 232      Assessment and plan- Patient is a 82 y.o. female with  pathological prognostic stage IIb invasive mammary carcinoma of the left breast pT2 N1a M0 ER/PR positive HER2 negative.  S/p adjuvant radiation therapy and currently on letrozole and this is a routine follow-up  visit  Patient is tolerating letrozole well without any significant side effects.  LFTs are normal.  Okay to continue letrozole for at least 5 years if tolerated.  I will see her back again in 3 months with labs   Visit Diagnosis 1. Encounter for follow-up surveillance of breast cancer   2. Use of letrozole (Femara)      Dr. Owens Shark, MD, MPH [facility] [phone] 09/16/2022 1:30 PM

## 2022-09-18 ENCOUNTER — Other Ambulatory Visit: Payer: Self-pay | Admitting: Family Medicine

## 2022-09-18 DIAGNOSIS — N1831 Chronic kidney disease, stage 3a: Secondary | ICD-10-CM

## 2022-09-18 DIAGNOSIS — E1159 Type 2 diabetes mellitus with other circulatory complications: Secondary | ICD-10-CM

## 2022-09-21 DIAGNOSIS — E1059 Type 1 diabetes mellitus with other circulatory complications: Secondary | ICD-10-CM | POA: Diagnosis not present

## 2022-09-21 DIAGNOSIS — I129 Hypertensive chronic kidney disease with stage 1 through stage 4 chronic kidney disease, or unspecified chronic kidney disease: Secondary | ICD-10-CM | POA: Diagnosis not present

## 2022-09-21 DIAGNOSIS — C773 Secondary and unspecified malignant neoplasm of axilla and upper limb lymph nodes: Secondary | ICD-10-CM | POA: Diagnosis not present

## 2022-09-21 DIAGNOSIS — E1022 Type 1 diabetes mellitus with diabetic chronic kidney disease: Secondary | ICD-10-CM | POA: Diagnosis not present

## 2022-09-21 DIAGNOSIS — C50012 Malignant neoplasm of nipple and areola, left female breast: Secondary | ICD-10-CM | POA: Diagnosis not present

## 2022-09-21 DIAGNOSIS — M810 Age-related osteoporosis without current pathological fracture: Secondary | ICD-10-CM | POA: Diagnosis not present

## 2022-09-21 DIAGNOSIS — F03C0 Unspecified dementia, severe, without behavioral disturbance, psychotic disturbance, mood disturbance, and anxiety: Secondary | ICD-10-CM | POA: Diagnosis not present

## 2022-09-21 DIAGNOSIS — Z48815 Encounter for surgical aftercare following surgery on the digestive system: Secondary | ICD-10-CM | POA: Diagnosis not present

## 2022-09-21 DIAGNOSIS — N1831 Chronic kidney disease, stage 3a: Secondary | ICD-10-CM | POA: Diagnosis not present

## 2022-09-22 DIAGNOSIS — E1022 Type 1 diabetes mellitus with diabetic chronic kidney disease: Secondary | ICD-10-CM | POA: Diagnosis not present

## 2022-09-22 DIAGNOSIS — I129 Hypertensive chronic kidney disease with stage 1 through stage 4 chronic kidney disease, or unspecified chronic kidney disease: Secondary | ICD-10-CM | POA: Diagnosis not present

## 2022-09-22 DIAGNOSIS — C50012 Malignant neoplasm of nipple and areola, left female breast: Secondary | ICD-10-CM | POA: Diagnosis not present

## 2022-09-22 DIAGNOSIS — C773 Secondary and unspecified malignant neoplasm of axilla and upper limb lymph nodes: Secondary | ICD-10-CM | POA: Diagnosis not present

## 2022-09-22 DIAGNOSIS — F03C0 Unspecified dementia, severe, without behavioral disturbance, psychotic disturbance, mood disturbance, and anxiety: Secondary | ICD-10-CM | POA: Diagnosis not present

## 2022-09-22 DIAGNOSIS — N1831 Chronic kidney disease, stage 3a: Secondary | ICD-10-CM | POA: Diagnosis not present

## 2022-09-22 DIAGNOSIS — Z48815 Encounter for surgical aftercare following surgery on the digestive system: Secondary | ICD-10-CM | POA: Diagnosis not present

## 2022-09-22 DIAGNOSIS — E1059 Type 1 diabetes mellitus with other circulatory complications: Secondary | ICD-10-CM | POA: Diagnosis not present

## 2022-09-22 DIAGNOSIS — M810 Age-related osteoporosis without current pathological fracture: Secondary | ICD-10-CM | POA: Diagnosis not present

## 2022-10-02 ENCOUNTER — Other Ambulatory Visit: Payer: Self-pay | Admitting: Family Medicine

## 2022-10-02 DIAGNOSIS — I152 Hypertension secondary to endocrine disorders: Secondary | ICD-10-CM

## 2022-10-02 DIAGNOSIS — N1831 Chronic kidney disease, stage 3a: Secondary | ICD-10-CM

## 2022-10-02 NOTE — Progress Notes (Unsigned)
Name: [name]   MRN: [id number]    DOB: Jul 15, 1940   Date:10/05/2022       Progress Note  Subjective  Chief Complaint  Follow Up  She is here with husband and grandson - Advertising copywriter - that lives with them and helps as a caregiver   HPI  Gait instability: she had home PT in the past to improve gait and seems to help temporarily . Husband states currently she is okay , she walks with assistance .   Breast cancer left : diagnosed January 2024  , she is seeing Dr. Smith Robert and Dr. Crystal.S/p mastectomy and she is currently on Femara   Diagnosis- pathological prognostic stage IIa invasive mammary carcinoma of the left breast pT2 N1a M0 ER/PR positive HER2 negative   Cholecystectomy: done 07/2022 due to acute cholecystitis and choledocholithiases .   Diabetes type I:  She is seeing Dr. Gershon Crane, [facility] was 7.1 % in April 2024. She has associated dyslipidemia, CKI and HTN. Taking ARB, statin therapy , she is up to date with eye exam.   HTN: bp seems to be in the 130's at other doctor's visits, at home it goes up and down, today it is low. We will  continue medications but advised to give losartan hydrochlorothiazide in am and norvasc in pm, monitor bp for the next week and send me the readings so we can adjust dose if BP remains low.  She denies chest pain , dizziness or palpitation  Atherosclerosis of aorta: taking high dose Atorvastatin , last LDL was 83, goal is below 70 , discussed with family members and they prefer not changing or adding medications , last LDL was 67, continue medications  Dementia  seen by neurologist years ago, at the time Dr. Sherryll Burger felt secondary to metabolic dysfunction due to uncontrolled DM, family states worse symptoms is short term memory loss. Cooperative, no longer getting agitated at night.  No wondering. She is taking Aricept since Fall 2022,  she is still very cooperatives   Patient Active Problem List   Diagnosis Date Noted   Constipation 06/30/2022    Choledocholithiasis 06/27/2022   Dementia (HCC) 06/24/2022   Biliary colic 06/24/2022   Abnormal LFTs 06/24/2022   Breast cancer s/p  unilateral modified radical mastectomy, left 05/2022 06/24/2022   Breast cancer (HCC) 05/25/2022   Genetic testing 05/01/2022   Malignant neoplasm of areola of left breast in female, estrogen receptor positive (HCC) 04/14/2022   Mild protein-calorie malnutrition (HCC) 07/29/2021   Stage 3a chronic kidney disease (HCC) 07/29/2021   Gait instability 07/29/2021   Type 1 diabetes mellitus with hyperglycemia, with long-term current use of insulin (HCC) 04/28/2018   Atherosclerosis of aorta (HCC) 04/01/2016   Aortic sclerosis 09/19/2014   Hypertension associated with diabetes (HCC) 09/19/2014   Cholelithiasis 09/19/2014   Carpal tunnel syndrome 09/19/2014   Detached retina 09/19/2014   Type 1 diabetes mellitus with microalbuminuria (HCC) 09/19/2014   Dyslipidemia 09/19/2014   Hypophosphatemia 09/19/2014   Mild cognitive impairment with memory loss 09/19/2014   Microalbuminuria 09/19/2014   OP (osteoporosis) 09/19/2014   Menopause 09/19/2014   Ptosis of eyelid 09/19/2014   Hypoglycemia associated with diabetes (HCC) 10/24/2013   Urge incontinence 08/01/2013   Hyperlipidemia due to type 1 diabetes mellitus (HCC) 08/01/2013   Vitamin D deficiency 02/24/2007    Past Surgical History:  Procedure Laterality Date   AXILLARY LYMPH NODE BIOPSY Left 04/07/2022   bx done, hydromarker placed, path pending   BREAST BIOPSY Left 04/07/2022  [place] bx mass, ribbon marker, path pending   BREAST BIOPSY Left 04/07/2022   [place] LT BREAST BX W LOC DEV 1ST LESION IMG BX SPEC US GUIDE 04/07/2022 [facility]   BREAST EXCISIONAL BIOPSY Left    neg   BREAST SURGERY  1970   EYE SURGERY     detached retina   MASTECTOMY MODIFIED RADICAL Left 05/25/2022   Procedure: MASTECTOMY MODIFIED RADICAL;  Surgeon: Carolan Shiver, MD;  Location: [facility];  Service: General;   Laterality: Left;    Family History  Problem Relation Age of Onset   Alzheimer's disease Mother    Diabetes Mother    Arthritis Mother    Diabetes Father    Hypertension Father    Hyperlipidemia Father    Stroke Brother    Multiple sclerosis Daughter     Social History   Tobacco Use   Smoking status: Never   Smokeless tobacco: Never   Tobacco comments:    smoking cessation materials not required  Substance Use Topics   Alcohol use: No    Alcohol/week: 0.0 standard drinks of alcohol     Current Outpatient Medications:    Accu-Chek Softclix Lancets lancets, , Disp: , Rfl:    alendronate (FOSAMAX) 70 MG tablet, TAKE WITH A FULL GLASS OF WATER ON AN EMPTY STOMACH., Disp: 12 tablet, Rfl: 1   blood glucose meter kit and supplies, , Disp: , Rfl:    Calcium Carb-Cholecalciferol 600-10 MG-MCG TABS, Take 1 tablet by mouth 2 (two) times daily., Disp: , Rfl:    [employer] ALCOHOL SWABS PADS, USE 4 TIMES DAILY TO WIPE SKIN BEFORE USING INSULIN, Disp: 400 each, Rfl: 2   donepezil (ARICEPT) 10 MG tablet, Take 1 tablet (10 mg total) by mouth at bedtime., Disp: 90 tablet, Rfl: 1   Glucagon (BAQSIMI ONE PACK) 3 MG/DOSE POWD, , Disp: , Rfl:    insulin aspart (NOVOLOG) 100 UNIT/ML FlexPen, Inject 6-10 Units into the skin 3 (three) times daily with meals., Disp: , Rfl:    insulin glargine-yfgn (SEMGLEE) 100 UNIT/ML injection, Inject 0.05 mLs (5 Units total) into the skin daily., Disp: 10 mL, Rfl: 11   letrozole (FEMARA) 2.5 MG tablet, TAKE 1 TABLET BY MOUTH EVERY DAY, Disp: 90 tablet, Rfl: 1   polyethylene glycol (MIRALAX / GLYCOLAX) 17 g packet, Take 17 g by mouth daily., Disp: 14 each, Rfl: 0   amLODipine (NORVASC) 5 MG tablet, Take 1 tablet (5 mg total) by mouth at bedtime., Disp: 90 tablet, Rfl: 1   losartan-hydrochlorothiazide (HYZAAR) 100-12.5 MG tablet, Take 1 tablet by mouth daily before breakfast., Disp: 90 tablet, Rfl: 1  Allergies  Allergen Reactions   Colesevelam Hcl     scotomas    Daucus Carota    Penicillins Itching    I personally reviewed active problem list, medication list, allergies, family history, social history, health maintenance with the patient/caregiver today.   ROS  Ten systems reviewed and is negative except as mentioned in HPI    Objective  Vitals:   10/05/22 1425  BP: 118/66  Pulse: 63  Resp: 16  SpO2: 95%  Weight: 121 lb (54.9 kg)  Height: 5\' 2"  (1.575 m)    Body mass index is 22.13 kg/m.  Physical Exam  Constitutional: Patient appears well-developed and No distress.  HEENT: head atraumatic, normocephalic, pupils equal and reactive to light, neck supple, throat within normal limits Cardiovascular: Normal rate, regular rhythm and normal heart sounds.  No murmur heard. No BLE edema. Pulmonary/Chest: Effort normal  and breath sounds normal. No respiratory distress. Abdominal: Soft.  There is no tenderness. Psychiatric: Patient has a normal mood and affect.   Diabetic Foot Exam: Diabetic Foot Exam - Simple   Simple Foot Form Visual Inspection See comments: Yes Sensation Testing Intact to touch and monofilament testing bilaterally: Yes Pulse Check Posterior Tibialis and Dorsalis pulse intact bilaterally: Yes Comments Thick and brittle first toe nails left foot      PHQ2/9:    10/05/2022    2:25 PM 04/14/2022    3:01 PM 03/24/2022    2:14 PM 01/27/2022    1:12 PM 07/29/2021    1:21 PM  Depression screen PHQ 2/9  Decreased Interest 0 0 0 0 0  Down, Depressed, Hopeless 0 0 0 0 0  PHQ - 2 Score 0 0 0 0 0  Altered sleeping 0 0 0 0   Tired, decreased energy 0 0 0 0   Change in appetite 0 0 0 0   Feeling bad or failure about yourself  0 0 0 0   Trouble concentrating 0 0 0 0   Moving slowly or fidgety/restless 0 0 0 0   Suicidal thoughts 0 0 0 0   PHQ-9 Score 0 0 0 0   Difficult doing work/chores   Not difficult at all      phq 9 is negative   Fall Risk:    10/05/2022    2:33 PM 03/24/2022    2:14 PM 01/27/2022     1:11 PM 07/29/2021    1:21 PM 01/16/2021    1:06 PM  Fall Risk   Falls in the past year? 0 0 1 0 0  Number falls in past yr: 0 0 0 0 0  Injury with Fall? 0 0 0 0   Risk for fall due to : Impaired balance/gait;Impaired mobility  Impaired balance/gait Impaired balance/gait Medication side effect;Impaired balance/gait  Follow up Falls prevention discussed  Falls prevention discussed Falls prevention discussed Falls prevention discussed      Functional Status Survey: Is the patient deaf or have difficulty hearing?: No Does the patient have difficulty seeing, even when wearing glasses/contacts?: Yes Does the patient have difficulty concentrating, remembering, or making decisions?: Yes Does the patient have difficulty walking or climbing stairs?: Yes Does the patient have difficulty dressing or bathing?: Yes Does the patient have difficulty doing errands alone such as visiting a doctor's office or shopping?: Yes    Assessment & Plan   1. Type 1 diabetes mellitus with microalbuminuria (HCC)  - HM Diabetes Foot Exam  2. Hypertension associated with diabetes (HCC)  - amLODipine (NORVASC) 5 MG tablet; Take 1 tablet (5 mg total) by mouth at bedtime.  Dispense: 90 tablet; Refill: 1 - losartan-hydrochlorothiazide (HYZAAR) 100-12.5 MG tablet; Take 1 tablet by mouth daily before breakfast.  Dispense: 90 tablet; Refill: 1  3. Dementia without behavioral disturbance (HCC)  - donepezil (ARICEPT) 10 MG tablet; Take 1 tablet (10 mg total) by mouth at bedtime.  Dispense: 90 tablet; Refill: 1  4. Atherosclerosis of aorta (HCC)  On statin therapy   5. Age-related osteoporosis without current pathological fracture  On Alendronate  6. Vitamin D deficiency  Compliant with vitamin D   7. Breast cancer s/p  unilateral modified radical mastectomy, left 05/2022   Under the care of Dr. Smith Robert

## 2022-10-05 ENCOUNTER — Encounter: Payer: Self-pay | Admitting: Family Medicine

## 2022-10-05 ENCOUNTER — Ambulatory Visit (INDEPENDENT_AMBULATORY_CARE_PROVIDER_SITE_OTHER): Payer: Medicare HMO | Admitting: Family Medicine

## 2022-10-05 VITALS — BP 118/66 | HR 63 | Resp 16 | Ht 62.0 in | Wt 121.0 lb

## 2022-10-05 DIAGNOSIS — I152 Hypertension secondary to endocrine disorders: Secondary | ICD-10-CM

## 2022-10-05 DIAGNOSIS — R809 Proteinuria, unspecified: Secondary | ICD-10-CM | POA: Diagnosis not present

## 2022-10-05 DIAGNOSIS — M810 Age-related osteoporosis without current pathological fracture: Secondary | ICD-10-CM

## 2022-10-05 DIAGNOSIS — F0390 Unspecified dementia without behavioral disturbance: Secondary | ICD-10-CM

## 2022-10-05 DIAGNOSIS — Z9012 Acquired absence of left breast and nipple: Secondary | ICD-10-CM

## 2022-10-05 DIAGNOSIS — E1159 Type 2 diabetes mellitus with other circulatory complications: Secondary | ICD-10-CM

## 2022-10-05 DIAGNOSIS — I700 Atherosclerosis of aorta: Secondary | ICD-10-CM | POA: Diagnosis not present

## 2022-10-05 DIAGNOSIS — E559 Vitamin D deficiency, unspecified: Secondary | ICD-10-CM

## 2022-10-05 DIAGNOSIS — E1029 Type 1 diabetes mellitus with other diabetic kidney complication: Secondary | ICD-10-CM

## 2022-10-05 MED ORDER — LOSARTAN POTASSIUM-HCTZ 100-12.5 MG PO TABS
1.0000 | ORAL_TABLET | Freq: Every day | ORAL | 1 refills | Status: DC
Start: 2022-10-05 — End: 2023-03-12

## 2022-10-05 MED ORDER — AMLODIPINE BESYLATE 5 MG PO TABS
5.0000 mg | ORAL_TABLET | Freq: Every evening | ORAL | 1 refills | Status: DC
Start: 2022-10-05 — End: 2023-03-12

## 2022-10-05 MED ORDER — DONEPEZIL HCL 10 MG PO TABS
10.0000 mg | ORAL_TABLET | Freq: Every day | ORAL | 1 refills | Status: DC
Start: 2022-10-05 — End: 2023-03-12

## 2022-10-05 NOTE — Patient Instructions (Signed)
Tdap and RSV at local pharmacy

## 2022-10-27 DIAGNOSIS — E1065 Type 1 diabetes mellitus with hyperglycemia: Secondary | ICD-10-CM | POA: Diagnosis not present

## 2022-10-27 DIAGNOSIS — I152 Hypertension secondary to endocrine disorders: Secondary | ICD-10-CM | POA: Diagnosis not present

## 2022-10-27 DIAGNOSIS — M810 Age-related osteoporosis without current pathological fracture: Secondary | ICD-10-CM | POA: Diagnosis not present

## 2022-10-27 DIAGNOSIS — E1069 Type 1 diabetes mellitus with other specified complication: Secondary | ICD-10-CM | POA: Diagnosis not present

## 2022-10-27 DIAGNOSIS — E785 Hyperlipidemia, unspecified: Secondary | ICD-10-CM | POA: Diagnosis not present

## 2022-11-22 ENCOUNTER — Other Ambulatory Visit: Payer: Self-pay | Admitting: Family Medicine

## 2022-11-22 DIAGNOSIS — G3184 Mild cognitive impairment, so stated: Secondary | ICD-10-CM

## 2022-11-23 ENCOUNTER — Ambulatory Visit
Admission: RE | Admit: 2022-11-23 | Discharge: 2022-11-23 | Disposition: A | Discharge status: Home / Self Care | Payer: Medicare HMO | Source: Ambulatory Visit | Attending: Oncology | Admitting: Oncology

## 2022-11-23 DIAGNOSIS — M810 Age-related osteoporosis without current pathological fracture: Secondary | ICD-10-CM | POA: Insufficient documentation

## 2022-11-23 DIAGNOSIS — Z170 Estrogen receptor positive status [ER+]: Secondary | ICD-10-CM | POA: Diagnosis not present

## 2022-11-23 DIAGNOSIS — Z7189 Other specified counseling: Secondary | ICD-10-CM | POA: Diagnosis not present

## 2022-11-23 DIAGNOSIS — C50012 Malignant neoplasm of nipple and areola, left female breast: Secondary | ICD-10-CM | POA: Insufficient documentation

## 2022-12-18 ENCOUNTER — Inpatient Hospital Stay (HOSPITAL_BASED_OUTPATIENT_CLINIC_OR_DEPARTMENT_OTHER): Payer: Medicare HMO | Admitting: Oncology

## 2022-12-18 ENCOUNTER — Inpatient Hospital Stay: Payer: Medicare HMO | Attending: Oncology

## 2022-12-18 ENCOUNTER — Encounter: Payer: Self-pay | Admitting: Oncology

## 2022-12-18 VITALS — BP 152/67 | HR 60 | Temp 96.0°F | Resp 16 | Ht 62.0 in

## 2022-12-18 DIAGNOSIS — Z79811 Long term (current) use of aromatase inhibitors: Secondary | ICD-10-CM | POA: Diagnosis not present

## 2022-12-18 DIAGNOSIS — Z5181 Encounter for therapeutic drug level monitoring: Secondary | ICD-10-CM

## 2022-12-18 DIAGNOSIS — C50112 Malignant neoplasm of central portion of left female breast: Secondary | ICD-10-CM

## 2022-12-18 DIAGNOSIS — Z08 Encounter for follow-up examination after completed treatment for malignant neoplasm: Secondary | ICD-10-CM

## 2022-12-18 DIAGNOSIS — Z170 Estrogen receptor positive status [ER+]: Secondary | ICD-10-CM | POA: Diagnosis not present

## 2022-12-18 DIAGNOSIS — C50412 Malignant neoplasm of upper-outer quadrant of left female breast: Secondary | ICD-10-CM | POA: Diagnosis not present

## 2022-12-18 LAB — CMP (CANCER CENTER ONLY)
ALT: 16 U/L (ref 0–44)
AST: 19 U/L (ref 15–41)
Albumin: 3.5 g/dL (ref 3.5–5.0)
Alkaline Phosphatase: 63 U/L (ref 38–126)
Anion gap: 7 (ref 5–15)
BUN: 14 mg/dL (ref 8–23)
CO2: 26 mmol/L (ref 22–32)
Calcium: 9.1 mg/dL (ref 8.9–10.3)
Chloride: 102 mmol/L (ref 98–111)
Creatinine: 0.71 mg/dL (ref 0.44–1.00)
GFR, Estimated: >60 mL/min (ref >60)
Glucose, Bld: 274 mg/dL — ABNORMAL HIGH (ref 70–99)
Potassium: 4 mmol/L (ref 3.5–5.1)
Sodium: 135 mmol/L (ref 135–145)
Total Bilirubin: 0.5 mg/dL (ref 0.3–1.2)
Total Protein: 7.2 g/dL (ref 6.5–8.1)

## 2022-12-19 NOTE — Progress Notes (Signed)
attached to the encounter.  DG Bone Density  Result Date: 11/23/2022 EXAM: DUAL X-RAY ABSORPTIOMETRY (DXA) FOR BONE MINERAL DENSITY IMPRESSION: Your patient [name] completed a BMD test on 11/23/2022 using the [employer] DXA System (software version: 14.10) manufactured by [employer]. The following summarizes the results of our evaluation. Technologist: [facility] PATIENT BIOGRAPHICAL: Name: [name], [name] Patient ID: [id number] Birth Date: 04/28/1940 Height: 62.0 in. Gender: Female Exam Date: 11/23/2022 Weight: 130.7 lbs. Indications: Advanced Age, Diabetic, Height Loss, Postmenopausal, Vitamin D Deficiency Fractures: Treatments: Calcium, Fosamax, Insulin, lipitor, Vitamin D DENSITOMETRY RESULTS: Site      Region     Measured Date Measured Age WHO Classification Young Adult T-score BMD         %Change vs. Previous Significant Change (*) AP Spine L1-L3 11/23/20[age].7 Osteoporosis -3.1 0.798 g/cm2 - - DualFemur Neck Left 11/23/2022 79.7 Osteoporosis -2.9 0.635 g/cm2 -26.1% Yes DualFemur Neck Left 11/18/2016 73.7 Osteopenia -1.3 0.859 g/cm2 - - DualFemur Total Mean 11/23/2022 79.7 Osteopenia -2.4 0.703 g/cm2 -4.1% Yes DualFemur Total Mean 11/18/2016 73.7 Osteopenia -2.2 0.733 g/cm2 - - ASSESSMENT: The BMD measured at AP Spine L1-L3 is 0.798 g/cm2 with a T-score of -3.1. This patient's diagnostic category is OSTEOPOROSIS according to World Health Organization [facility]) criteria. The scan quality is good. L-4 was excluded due to degenerative changes. significant decrease in the total hip. World Science writer [facility]) criteria for post-menopausal, Caucasian Women: Normal:                   T-score at or above -1 SD Osteopenia/low bone mass: T-score between -1 and -2.5 SD Osteoporosis:             T-score at or below -2.5 SD RECOMMENDATIONS: 1. All patients should optimize calcium and vitamin D intake. 2. Consider FDA-approved medical therapies in postmenopausal women and men aged 79 years and older, based on the following: a. A hip or vertebral(clinical or morphometric) fracture b. T-score < -2.5 at the femoral neck or spine after appropriate evaluation to exclude secondary causes c. Low bone mass (T-score between -1.0 and -2.5 at the femoral neck or spine) and a 10-year probability of  a hip fracture > 3% or a 10-year probability of a major osteoporosis-related fracture > 20% based on the [place]-adapted WHO algorithm 3. Clinician judgment and/or patient preferences may indicate treatment for people with 10-year fracture probabilities above or below these levels FOLLOW-UP: People with diagnosed cases of osteoporosis or at high risk for fracture should have regular bone mineral density tests. For patients eligible for Medicare, routine testing is allowed once every 2 years. The testing frequency can be increased to one year for patients who have rapidly progressing disease, those who are receiving or discontinuing medical therapy to restore bone mass, or have additional risk factors. I have reviewed this report, and agree with the above findings. [facility] Electronically Signed   By: Harmon Pier M.D.   On: 11/23/2022 13:49     Assessment and plan- Patient is a 82 y.o. female with h/o stage 2 ER PR positive her 2 negative left breast cancer s/p mastectomy, adjuvant radiation and currently on letrozole here for routine f/u  Clinically patient is doing well with no signs and symptoms of recurrence based on todays exam. Tolerating letrozole well without significant side effects. Patient has baseline osteoporosis but she is on weekly fosamax. We will continue to monitor her bone density every other year or potentially next year on letrozole.  Hematology/Oncology Consult note [facility]  [phone] [phone]  Patient Care Team: Alba Cory, MD as PCP - General (Family Medicine) [place] Crane, MD as Consulting Physician (Ophthalmology) Sherlon Handing, MD as Consulting Physician (Internal Medicine) Hulen Luster, RN as Oncology Nurse Navigator   Name of the patient: [name]  [id number]  24-Jan-1941   Date of visit: 12/19/22  Diagnosis- pathological prognostic stage IIa invasive mammary carcinoma of the left breast pT2 N1a M0 ER/PR positive HER2 negative   Chief complaint/ Reason for visit- routine f/u of breast cancer on letrozole  Heme/Onc history: Patient is a 82 year old female who underwent a bilateral diagnostic mammogram for palpable mass in the left breast in January 2024.  Mammogram showed hypoechoic mass measuring 3.1 x 1.9 x 3.4 cm in the left retroareolar breast at 2 o'clock position with invasion into the dermis and retroareolar breast.  2 enlarged left axillary lymph nodes.  No mammographic evidence of malignancy in the right breast.  Patient had a left breast biopsy as well as biopsy of the left axillary lymph which showed invasive mammary carcinoma grade 310 mm in the primary breast specimen and 7 mm in the lymph node.  Tumor was ER greater than 90% positive, PR 60% positive and HER2 negative.   Patient is here with her daughter today who is her healthcare power of attorney.  Patient has advanced dementia and is dependent on her family members for ADLs including dressing and ambulation to some extent.  She is incontinent.  She has not had any recent infections or hospitalizations.   Final pathology showed 3.2 cm grade 3 invasive mammary carcinoma with negative margins.  3 out of 15 lymph nodes were positive for malignancy.   Patient had started letrozole briefly but was held due to abnormal LFTs which were noticed at the time of her cholecystitis.   She completed adjuvant radiation and restarted letrozole  Interval history- no acute issues since last visit. Patient is able to verbalize today and stated she is doing well. Denies any new pain  ECOG PS- 3 Pain scale- 0   Review of systems- Review of Systems  Constitutional:  Negative for chills, fever, malaise/fatigue and weight loss.  HENT:  Negative for congestion, ear discharge and nosebleeds.   Eyes:  Negative for blurred vision.  Respiratory:  Negative for cough, hemoptysis, sputum production, shortness of breath and wheezing.   Cardiovascular:  Negative for chest pain, palpitations, orthopnea and claudication.  Gastrointestinal:  Negative for abdominal pain, blood in stool, constipation, diarrhea, heartburn, melena, nausea and vomiting.  Genitourinary:  Negative for dysuria, flank pain, frequency, hematuria and urgency.  Musculoskeletal:  Negative for back pain, joint pain and myalgias.  Skin:  Negative for rash.  Neurological:  Negative for dizziness, tingling, focal weakness, seizures, weakness and headaches.  Endo/Heme/Allergies:  Does not bruise/bleed easily.  Psychiatric/Behavioral:  Negative for depression and suicidal ideas. The patient does not have insomnia.       Allergies  Allergen Reactions   Colesevelam Hcl     scotomas   Daucus Carota    Penicillins Itching     Past Medical History:  Diagnosis Date   Arthritis    Atherosclerosis of aorta (HCC)    CKD (chronic kidney disease) stage 3, GFR 30-59 ml/min (HCC)    Dementia (HCC)    Diabetes mellitus without complication (HCC)    Dysrhythmia    Heart murmur    Hypertension  attached to the encounter.  DG Bone Density  Result Date: 11/23/2022 EXAM: DUAL X-RAY ABSORPTIOMETRY (DXA) FOR BONE MINERAL DENSITY IMPRESSION: Your patient [name] completed a BMD test on 11/23/2022 using the [employer] DXA System (software version: 14.10) manufactured by [employer]. The following summarizes the results of our evaluation. Technologist: [facility] PATIENT BIOGRAPHICAL: Name: [name], [name] Patient ID: [id number] Birth Date: 04/28/1940 Height: 62.0 in. Gender: Female Exam Date: 11/23/2022 Weight: 130.7 lbs. Indications: Advanced Age, Diabetic, Height Loss, Postmenopausal, Vitamin D Deficiency Fractures: Treatments: Calcium, Fosamax, Insulin, lipitor, Vitamin D DENSITOMETRY RESULTS: Site      Region     Measured Date Measured Age WHO Classification Young Adult T-score BMD         %Change vs. Previous Significant Change (*) AP Spine L1-L3 11/23/2022 81.7 Osteoporosis -3.1 0.798 g/cm2 - - DualFemur Neck Left 11/23/2022 79.7 Osteoporosis -2.9 0.635 g/cm2 -26.1% Yes DualFemur Neck Left 11/18/2016 73.7 Osteopenia -1.3 0.859 g/cm2 - - DualFemur Total Mean 11/23/2022 79.7 Osteopenia -2.4 0.703 g/cm2 -4.1% Yes DualFemur Total Mean 11/18/2016 73.7 Osteopenia -2.2 0.733 g/cm2 - - ASSESSMENT: The BMD measured at AP Spine L1-L3 is 0.798 g/cm2 with a T-score of -3.1. This patient's diagnostic category is OSTEOPOROSIS according to World Health Organization [facility]) criteria. The scan quality is good. L-4 was excluded due to degenerative changes. significant decrease in the total hip. World Science writer [facility]) criteria for post-menopausal, Caucasian Women: Normal:                   T-score at or above -1 SD Osteopenia/low bone mass: T-score between -1 and -2.5 SD Osteoporosis:             T-score at or below -2.5 SD RECOMMENDATIONS: 1. All patients should optimize calcium and vitamin D intake. 2. Consider FDA-approved medical therapies in postmenopausal women and men aged 79 years and older, based on the following: a. A hip or vertebral(clinical or morphometric) fracture b. T-score < -2.5 at the femoral neck or spine after appropriate evaluation to exclude secondary causes c. Low bone mass (T-score between -1.0 and -2.5 at the femoral neck or spine) and a 10-year probability of  a hip fracture > 3% or a 10-year probability of a major osteoporosis-related fracture > 20% based on the [place]-adapted WHO algorithm 3. Clinician judgment and/or patient preferences may indicate treatment for people with 10-year fracture probabilities above or below these levels FOLLOW-UP: People with diagnosed cases of osteoporosis or at high risk for fracture should have regular bone mineral density tests. For patients eligible for Medicare, routine testing is allowed once every 2 years. The testing frequency can be increased to one year for patients who have rapidly progressing disease, those who are receiving or discontinuing medical therapy to restore bone mass, or have additional risk factors. I have reviewed this report, and agree with the above findings. [facility] Electronically Signed   By: Harmon Pier M.D.   On: 11/23/2022 13:49     Assessment and plan- Patient is a 82 y.o. female with h/o stage 2 ER PR positive her 2 negative left breast cancer s/p mastectomy, adjuvant radiation and currently on letrozole here for routine f/u  Clinically patient is doing well with no signs and symptoms of recurrence based on todays exam. Tolerating letrozole well without significant side effects. Patient has baseline osteoporosis but she is on weekly fosamax. We will continue to monitor her bone density every other year or potentially next year on letrozole.  Hematology/Oncology Consult note [facility]  [phone] [phone]  Patient Care Team: Alba Cory, MD as PCP - General (Family Medicine) [place] Crane, MD as Consulting Physician (Ophthalmology) Sherlon Handing, MD as Consulting Physician (Internal Medicine) Hulen Luster, RN as Oncology Nurse Navigator   Name of the patient: [name]  [id number]  24-Jan-1941   Date of visit: 12/19/22  Diagnosis- pathological prognostic stage IIa invasive mammary carcinoma of the left breast pT2 N1a M0 ER/PR positive HER2 negative   Chief complaint/ Reason for visit- routine f/u of breast cancer on letrozole  Heme/Onc history: Patient is a 82 year old female who underwent a bilateral diagnostic mammogram for palpable mass in the left breast in January 2024.  Mammogram showed hypoechoic mass measuring 3.1 x 1.9 x 3.4 cm in the left retroareolar breast at 2 o'clock position with invasion into the dermis and retroareolar breast.  2 enlarged left axillary lymph nodes.  No mammographic evidence of malignancy in the right breast.  Patient had a left breast biopsy as well as biopsy of the left axillary lymph which showed invasive mammary carcinoma grade 310 mm in the primary breast specimen and 7 mm in the lymph node.  Tumor was ER greater than 90% positive, PR 60% positive and HER2 negative.   Patient is here with her daughter today who is her healthcare power of attorney.  Patient has advanced dementia and is dependent on her family members for ADLs including dressing and ambulation to some extent.  She is incontinent.  She has not had any recent infections or hospitalizations.   Final pathology showed 3.2 cm grade 3 invasive mammary carcinoma with negative margins.  3 out of 15 lymph nodes were positive for malignancy.   Patient had started letrozole briefly but was held due to abnormal LFTs which were noticed at the time of her cholecystitis.   She completed adjuvant radiation and restarted letrozole  Interval history- no acute issues since last visit. Patient is able to verbalize today and stated she is doing well. Denies any new pain  ECOG PS- 3 Pain scale- 0   Review of systems- Review of Systems  Constitutional:  Negative for chills, fever, malaise/fatigue and weight loss.  HENT:  Negative for congestion, ear discharge and nosebleeds.   Eyes:  Negative for blurred vision.  Respiratory:  Negative for cough, hemoptysis, sputum production, shortness of breath and wheezing.   Cardiovascular:  Negative for chest pain, palpitations, orthopnea and claudication.  Gastrointestinal:  Negative for abdominal pain, blood in stool, constipation, diarrhea, heartburn, melena, nausea and vomiting.  Genitourinary:  Negative for dysuria, flank pain, frequency, hematuria and urgency.  Musculoskeletal:  Negative for back pain, joint pain and myalgias.  Skin:  Negative for rash.  Neurological:  Negative for dizziness, tingling, focal weakness, seizures, weakness and headaches.  Endo/Heme/Allergies:  Does not bruise/bleed easily.  Psychiatric/Behavioral:  Negative for depression and suicidal ideas. The patient does not have insomnia.       Allergies  Allergen Reactions   Colesevelam Hcl     scotomas   Daucus Carota    Penicillins Itching     Past Medical History:  Diagnosis Date   Arthritis    Atherosclerosis of aorta (HCC)    CKD (chronic kidney disease) stage 3, GFR 30-59 ml/min (HCC)    Dementia (HCC)    Diabetes mellitus without complication (HCC)    Dysrhythmia    Heart murmur    Hypertension  Hematology/Oncology Consult note [facility]  [phone] [phone]  Patient Care Team: Alba Cory, MD as PCP - General (Family Medicine) [place] Crane, MD as Consulting Physician (Ophthalmology) Sherlon Handing, MD as Consulting Physician (Internal Medicine) Hulen Luster, RN as Oncology Nurse Navigator   Name of the patient: [name]  [id number]  24-Jan-1941   Date of visit: 12/19/22  Diagnosis- pathological prognostic stage IIa invasive mammary carcinoma of the left breast pT2 N1a M0 ER/PR positive HER2 negative   Chief complaint/ Reason for visit- routine f/u of breast cancer on letrozole  Heme/Onc history: Patient is a 82 year old female who underwent a bilateral diagnostic mammogram for palpable mass in the left breast in January 2024.  Mammogram showed hypoechoic mass measuring 3.1 x 1.9 x 3.4 cm in the left retroareolar breast at 2 o'clock position with invasion into the dermis and retroareolar breast.  2 enlarged left axillary lymph nodes.  No mammographic evidence of malignancy in the right breast.  Patient had a left breast biopsy as well as biopsy of the left axillary lymph which showed invasive mammary carcinoma grade 310 mm in the primary breast specimen and 7 mm in the lymph node.  Tumor was ER greater than 90% positive, PR 60% positive and HER2 negative.   Patient is here with her daughter today who is her healthcare power of attorney.  Patient has advanced dementia and is dependent on her family members for ADLs including dressing and ambulation to some extent.  She is incontinent.  She has not had any recent infections or hospitalizations.   Final pathology showed 3.2 cm grade 3 invasive mammary carcinoma with negative margins.  3 out of 15 lymph nodes were positive for malignancy.   Patient had started letrozole briefly but was held due to abnormal LFTs which were noticed at the time of her cholecystitis.   She completed adjuvant radiation and restarted letrozole  Interval history- no acute issues since last visit. Patient is able to verbalize today and stated she is doing well. Denies any new pain  ECOG PS- 3 Pain scale- 0   Review of systems- Review of Systems  Constitutional:  Negative for chills, fever, malaise/fatigue and weight loss.  HENT:  Negative for congestion, ear discharge and nosebleeds.   Eyes:  Negative for blurred vision.  Respiratory:  Negative for cough, hemoptysis, sputum production, shortness of breath and wheezing.   Cardiovascular:  Negative for chest pain, palpitations, orthopnea and claudication.  Gastrointestinal:  Negative for abdominal pain, blood in stool, constipation, diarrhea, heartburn, melena, nausea and vomiting.  Genitourinary:  Negative for dysuria, flank pain, frequency, hematuria and urgency.  Musculoskeletal:  Negative for back pain, joint pain and myalgias.  Skin:  Negative for rash.  Neurological:  Negative for dizziness, tingling, focal weakness, seizures, weakness and headaches.  Endo/Heme/Allergies:  Does not bruise/bleed easily.  Psychiatric/Behavioral:  Negative for depression and suicidal ideas. The patient does not have insomnia.       Allergies  Allergen Reactions   Colesevelam Hcl     scotomas   Daucus Carota    Penicillins Itching     Past Medical History:  Diagnosis Date   Arthritis    Atherosclerosis of aorta (HCC)    CKD (chronic kidney disease) stage 3, GFR 30-59 ml/min (HCC)    Dementia (HCC)    Diabetes mellitus without complication (HCC)    Dysrhythmia    Heart murmur    Hypertension

## 2023-02-08 ENCOUNTER — Ambulatory Visit (INDEPENDENT_AMBULATORY_CARE_PROVIDER_SITE_OTHER): Payer: Medicare HMO

## 2023-02-08 DIAGNOSIS — Z23 Encounter for immunization: Secondary | ICD-10-CM

## 2023-03-06 ENCOUNTER — Other Ambulatory Visit: Payer: Self-pay | Admitting: Family Medicine

## 2023-03-06 DIAGNOSIS — M810 Age-related osteoporosis without current pathological fracture: Secondary | ICD-10-CM

## 2023-03-09 ENCOUNTER — Ambulatory Visit: Payer: Medicare HMO | Admitting: Family Medicine

## 2023-03-12 ENCOUNTER — Ambulatory Visit (INDEPENDENT_AMBULATORY_CARE_PROVIDER_SITE_OTHER): Payer: Medicare HMO | Admitting: Family Medicine

## 2023-03-12 ENCOUNTER — Encounter: Payer: Self-pay | Admitting: Family Medicine

## 2023-03-12 VITALS — BP 132/70 | HR 59 | Resp 16 | Ht 62.0 in | Wt 133.1 lb

## 2023-03-12 DIAGNOSIS — C50912 Malignant neoplasm of unspecified site of left female breast: Secondary | ICD-10-CM | POA: Diagnosis not present

## 2023-03-12 DIAGNOSIS — E1029 Type 1 diabetes mellitus with other diabetic kidney complication: Secondary | ICD-10-CM

## 2023-03-12 DIAGNOSIS — Z9012 Acquired absence of left breast and nipple: Secondary | ICD-10-CM

## 2023-03-12 DIAGNOSIS — I700 Atherosclerosis of aorta: Secondary | ICD-10-CM | POA: Diagnosis not present

## 2023-03-12 DIAGNOSIS — I152 Hypertension secondary to endocrine disorders: Secondary | ICD-10-CM | POA: Diagnosis not present

## 2023-03-12 DIAGNOSIS — R809 Proteinuria, unspecified: Secondary | ICD-10-CM | POA: Diagnosis not present

## 2023-03-12 DIAGNOSIS — M810 Age-related osteoporosis without current pathological fracture: Secondary | ICD-10-CM | POA: Diagnosis not present

## 2023-03-12 DIAGNOSIS — Z170 Estrogen receptor positive status [ER+]: Secondary | ICD-10-CM

## 2023-03-12 DIAGNOSIS — F0390 Unspecified dementia without behavioral disturbance: Secondary | ICD-10-CM | POA: Diagnosis not present

## 2023-03-12 DIAGNOSIS — E1159 Type 2 diabetes mellitus with other circulatory complications: Secondary | ICD-10-CM

## 2023-03-12 MED ORDER — AMLODIPINE BESYLATE 5 MG PO TABS: 5.0000 mg | ORAL_TABLET | Freq: Every evening | ORAL | 1 refills | Status: DC

## 2023-03-12 MED ORDER — DONEPEZIL HCL 10 MG PO TABS: 10.0000 mg | ORAL_TABLET | Freq: Every day | ORAL | 1 refills | Status: DC

## 2023-03-12 MED ORDER — ATORVASTATIN CALCIUM 80 MG PO TABS: 80.0000 mg | ORAL_TABLET | Freq: Every day | ORAL | 3 refills | Status: DC

## 2023-03-12 MED ORDER — LOSARTAN POTASSIUM-HCTZ 100-12.5 MG PO TABS: 1.0000 | ORAL_TABLET | Freq: Every day | ORAL | 1 refills | Status: DC

## 2023-03-12 NOTE — Progress Notes (Signed)
 Name: [name]   MRN: [id number]    DOB: 06-Mar-1941   Date:03/12/2023       Progress Note  Subjective  Chief Complaint  Chief Complaint  Patient presents with   Medical Management of Chronic Issues    HPI  Daughter is here with her and her husband today  Breast cancer left : diagnosed January 2024  , she is seeing Dr. Melanee and Dr. Crystal.S/p mastectomy and she is currently on Femara     Diagnosis- pathological prognostic stage IIa invasive mammary carcinoma of the left breast pT2 N1a M0 ER/PR positive HER2 negative   Diabetes type I:  She is seeing Dr. Cherilyn, A1C was 7 % in August 2024. She has associated dyslipidemia, CKI and HTN. Taking ARB, statin therapy. She has a history of diabetic retinopathy    HTN: bp seems to be in the 130's at other doctor's visits, husband has not checked her bp at home lately . She denies chest pain , dizziness or palpitation   Atherosclerosis of aorta: she was on Atorvastatin , currently not on her active medication list, daughter states okay to re send to pharmacy  Dementia  seen by neurologist years ago, at the time Dr. Maree felt secondary to metabolic dysfunction due to uncontrolled DM, family states worse symptoms is short term memory loss.  No wondering. She is taking Aricept  since Fall 2022,  she is still very cooperatives . She has some sundowning, they mediate that by getting her ready for bed earlier.   Osteoporosis: taking alendronate  once a week , no side effects, mild progression of osteoporosis    Patient Active Problem List   Diagnosis Date Noted   Constipation 06/30/2022   Choledocholithiasis 06/27/2022   Dementia without behavioral disturbance (HCC) 06/24/2022   Biliary colic 06/24/2022   Abnormal LFTs 06/24/2022   Breast cancer s/p  unilateral modified radical mastectomy, left 05/2022 06/24/2022   Breast cancer (HCC) 05/25/2022   Genetic testing 05/01/2022   Malignant neoplasm of areola of left breast in female, estrogen  receptor positive (HCC) 04/14/2022   Mild protein-calorie malnutrition (HCC) 07/29/2021   Stage 3a chronic kidney disease (HCC) 07/29/2021   Gait instability 07/29/2021   Type 1 diabetes mellitus with hyperglycemia, with long-term current use of insulin  (HCC) 04/28/2018   Atherosclerosis of aorta (HCC) 04/01/2016   Aortic sclerosis 09/19/2014   Hypertension associated with diabetes (HCC) 09/19/2014   Cholelithiasis 09/19/2014   Carpal tunnel syndrome 09/19/2014   Detached retina 09/19/2014   Type 1 diabetes mellitus with microalbuminuria (HCC) 09/19/2014   Dyslipidemia 09/19/2014   Hypophosphatemia 09/19/2014   Microalbuminuria 09/19/2014   OP (osteoporosis) 09/19/2014   Menopause 09/19/2014   Ptosis of eyelid 09/19/2014   Hypoglycemia associated with diabetes (HCC) 10/24/2013   Urge incontinence 08/01/2013   Hyperlipidemia due to type 1 diabetes mellitus (HCC) 08/01/2013   Vitamin D  deficiency 02/24/2007    Past Surgical History:  Procedure Laterality Date   AXILLARY LYMPH NODE BIOPSY Left 04/07/2022   bx done, hydromarker placed, path pending   BREAST BIOPSY Left 04/07/2022   us  bx mass, ribbon marker, path pending   BREAST BIOPSY Left 04/07/2022   US  LT BREAST BX W LOC DEV 1ST LESION IMG BX SPEC US  GUIDE 04/07/2022 [facility]   BREAST EXCISIONAL BIOPSY Left    neg   BREAST SURGERY  1970   EYE SURGERY     detached retina   MASTECTOMY MODIFIED RADICAL Left 05/25/2022   Procedure: MASTECTOMY MODIFIED RADICAL;  Surgeon: Rodolph Romano,  MD;  Location: [facility];  Service: General;  Laterality: Left;    Family History  Problem Relation Age of Onset   Alzheimer's disease Mother    Diabetes Mother    Arthritis Mother    Diabetes Father    Hypertension Father    Hyperlipidemia Father    Stroke Brother    Multiple sclerosis Daughter     Social History   Tobacco Use   Smoking status: Never   Smokeless tobacco: Never   Tobacco comments:    smoking cessation  materials not required  Substance Use Topics   Alcohol use: No    Alcohol/week: 0.0 standard drinks of alcohol     Current Outpatient Medications:    Accu-Chek Softclix Lancets lancets, , Disp: , Rfl:    alendronate  (FOSAMAX ) 70 MG tablet, TAKE WITH A FULL GLASS OF WATER ON AN EMPTY STOMACH., Disp: 12 tablet, Rfl: 1   atorvastatin  (LIPITOR) 80 MG tablet, Take 1 tablet (80 mg total) by mouth daily., Disp: 90 tablet, Rfl: 3   blood glucose meter kit and supplies, , Disp: , Rfl:    Calcium  Carb-Cholecalciferol  600-10 MG-MCG TABS, Take 1 tablet by mouth 2 (two) times daily., Disp: , Rfl:    Continuous Glucose Sensor (DEXCOM G7 SENSOR) MISC, USE 1 EACH EVERY 10 (TEN) DAYS, Disp: , Rfl:    [employer] ALCOHOL SWABS PADS, USE 4 TIMES DAILY TO WIPE SKIN BEFORE USING INSULIN , Disp: 400 each, Rfl: 2   Glucagon  (BAQSIMI  ONE PACK) 3 MG/DOSE POWD, , Disp: , Rfl:    insulin  aspart (NOVOLOG ) 100 UNIT/ML FlexPen, Inject 6-10 Units into the skin 3 (three) times daily with meals., Disp: , Rfl:    letrozole  (FEMARA ) 2.5 MG tablet, TAKE 1 TABLET BY MOUTH EVERY DAY, Disp: 90 tablet, Rfl: 1   polyethylene glycol (MIRALAX  / GLYCOLAX ) 17 g packet, Take 17 g by mouth daily., Disp: 14 each, Rfl: 0   amLODipine  (NORVASC ) 5 MG tablet, Take 1 tablet (5 mg total) by mouth at bedtime., Disp: 90 tablet, Rfl: 1   BD PEN NEEDLE NANO 2ND GEN 32G X 4 MM MISC, USE 1 EACH 4 (FOUR) TIMES DAILY, Disp: , Rfl:    donepezil  (ARICEPT ) 10 MG tablet, Take 1 tablet (10 mg total) by mouth at bedtime., Disp: 90 tablet, Rfl: 1   losartan -hydrochlorothiazide  (HYZAAR) 100-12.5 MG tablet, Take 1 tablet by mouth daily before breakfast., Disp: 90 tablet, Rfl: 1   TRESIBA  FLEXTOUCH 100 UNIT/ML FlexTouch Pen, daily., Disp: , Rfl:   Allergies  Allergen Reactions   Colesevelam Hcl     scotomas   Daucus Carota    Penicillins Itching    I personally reviewed active problem list, allergies, family history with the patient/caregiver  today.   ROS  Ten systems reviewed and is negative except as mentioned in HPI    Objective  Vitals:   03/12/23 1413  BP: 132/70  Pulse: (!) 59  Resp: 16  SpO2: 94%  Weight: 133 lb 1.6 oz (60.4 kg)  Height: 5' 2 (1.575 m)    Body mass index is 24.34 kg/m.  Physical Exam  Constitutional: Patient appears frail .  No distress.  HEENT: head atraumatic, normocephalic, pupils equal and reactive to light, neck supple Cardiovascular: Normal rate, regular rhythm and normal heart sounds.  No murmur heard. No BLE edema. Pulmonary/Chest: Effort normal and breath sounds normal. No respiratory distress. Abdominal: Soft.  There is no tenderness. Psychiatric: cooperative, history given by family members   Recent Results (  from the past 2160 hours)  CMP (Cancer Center only)     Status: Abnormal   Collection Time: 12/18/22  1:46 PM  Result Value Ref Range   Sodium 135 135 - 145 mmol/L   Potassium 4.0 3.5 - 5.1 mmol/L   Chloride 102 98 - 111 mmol/L   CO2 26 22 - 32 mmol/L   Glucose, Bld 274 (H) 70 - 99 mg/dL    Comment: Glucose reference range applies only to samples taken after fasting for at least 8 hours.   BUN 14 8 - 23 mg/dL   Creatinine 9.28 9.55 - 1.00 mg/dL   Calcium  9.1 8.9 - 10.3 mg/dL   Total Protein 7.2 6.5 - 8.1 g/dL   Albumin 3.5 3.5 - 5.0 g/dL   AST 19 15 - 41 U/L   ALT 16 0 - 44 U/L   Alkaline Phosphatase 63 38 - 126 U/L   Total Bilirubin 0.5 0.3 - 1.2 mg/dL   GFR, Estimated >39 >39 mL/min    Comment: (NOTE) Calculated using the CKD-EPI Creatinine Equation (2021)    Anion gap 7 5 - 15    Comment: Performed at [facility], [address] [place], [place] [address]    Diabetic Foot Exam:     PHQ2/9:    03/12/2023    2:13 PM 10/05/2022    2:25 PM 04/14/2022    3:01 PM 03/24/2022    2:14 PM 01/27/2022    1:12 PM  Depression screen PHQ 2/9  Decreased Interest 0 0 0 0 0  Down, Depressed, Hopeless 0 0 0 0 0  PHQ - 2 Score 0 0 0 0 0  Altered sleeping 0  0 0 0 0  Tired, decreased energy 0 0 0 0 0  Change in appetite 0 0 0 0 0  Feeling bad or failure about yourself  0 0 0 0 0  Trouble concentrating 0 0 0 0 0  Moving slowly or fidgety/restless 0 0 0 0 0  Suicidal thoughts 0 0 0 0 0  PHQ-9 Score 0 0 0 0 0  Difficult doing work/chores Not difficult at all   Not difficult at all     phq 9 is negative   Fall Risk:    03/12/2023    2:03 PM 10/05/2022    2:33 PM 03/24/2022    2:14 PM 01/27/2022    1:11 PM 07/29/2021    1:21 PM  Fall Risk   Falls in the past year? 0 0 0 1 0  Number falls in past yr: 0 0 0 0 0  Injury with Fall? 0 0 0 0 0  Risk for fall due to : No Fall Risks Impaired balance/gait;Impaired mobility  Impaired balance/gait Impaired balance/gait  Follow up Falls prevention discussed;Education provided;Falls evaluation completed Falls prevention discussed  Falls prevention discussed Falls prevention discussed     Assessment & Plan  1. Type 1 diabetes mellitus with microalbuminuria (HCC) (Primary)  - Urine Microalbumin w/creat. ratio  2. Atherosclerosis of aorta (HCC)  Resume statin therapy, it was stopped due to increase in LFTs at the time of cholelithiasis  - atorvastatin  (LIPITOR) 80 MG tablet; Take 1 tablet (80 mg total) by mouth daily.  Dispense: 90 tablet; Refill: 3  3. Dementia without behavioral disturbance (HCC)  Continue Aricept   4. Malignant neoplasm of left breast in female, estrogen receptor positive, unspecified site of breast (HCC)  On Femara   5. Breast cancer s/p  unilateral modified radical mastectomy, left 05/2022  On Femara   6. Age-related osteoporosis without current pathological fracture  Mild progression, advised to discuss it with Dr. Cherilyn  7. Hypertension associated with diabetes (HCC)  - amLODipine  (NORVASC ) 5 MG tablet; Take 1 tablet (5 mg total) by mouth at bedtime.  Dispense: 90 tablet; Refill: 1 - losartan -hydrochlorothiazide  (HYZAAR) 100-12.5 MG tablet; Take 1 tablet by  mouth daily before breakfast.  Dispense: 90 tablet; Refill: 1

## 2023-03-13 LAB — MICROALBUMIN / CREATININE URINE RATIO
Creatinine, Urine: 73 mg/dL (ref 20–275)
Microalb, Ur: <0.2 mg/dL

## 2023-03-30 DIAGNOSIS — E1069 Type 1 diabetes mellitus with other specified complication: Secondary | ICD-10-CM | POA: Diagnosis not present

## 2023-03-30 DIAGNOSIS — M810 Age-related osteoporosis without current pathological fracture: Secondary | ICD-10-CM | POA: Diagnosis not present

## 2023-03-30 DIAGNOSIS — E1065 Type 1 diabetes mellitus with hyperglycemia: Secondary | ICD-10-CM | POA: Diagnosis not present

## 2023-03-30 DIAGNOSIS — I152 Hypertension secondary to endocrine disorders: Secondary | ICD-10-CM | POA: Diagnosis not present

## 2023-03-30 DIAGNOSIS — E785 Hyperlipidemia, unspecified: Secondary | ICD-10-CM | POA: Diagnosis not present

## 2023-04-28 ENCOUNTER — Other Ambulatory Visit: Payer: Self-pay | Admitting: Oncology

## 2023-06-16 ENCOUNTER — Other Ambulatory Visit: Payer: Self-pay | Admitting: Family Medicine

## 2023-06-16 DIAGNOSIS — G3184 Mild cognitive impairment, so stated: Secondary | ICD-10-CM

## 2023-06-17 ENCOUNTER — Ambulatory Visit: Payer: Self-pay

## 2023-06-17 DIAGNOSIS — Z0000 Encounter for general adult medical examination without abnormal findings: Secondary | ICD-10-CM | POA: Diagnosis not present

## 2023-06-17 NOTE — Progress Notes (Signed)
 Subjective:   [name] is a 83 y.o. who presents for a Medicare Wellness preventive visit.  Visit Complete: Virtual I connected with  [name] on 06/17/23 by a audio enabled telemedicine application and verified that I am speaking with the correct person using two identifiers.Spoke w/ husband & DAUGHTER  Patient Location: Home  Provider Location: Office/Clinic  I discussed the limitations of evaluation and management by telemedicine. The patient expressed understanding and agreed to proceed.  Vital Signs: Because this visit was a virtual/telehealth visit, some criteria may be missing or patient reported. Any vitals not documented were not able to be obtained and vitals that have been documented are patient reported.  VideoDeclined- This patient declined Librarian, academic. Therefore the visit was completed with audio only.  Persons Participating in Visit: Patient assisted by husband & daughter.  AWV Questionnaire: No: Patient Medicare AWV questionnaire was not completed prior to this visit.  Cardiac Risk Factors include: advanced age (>67men, >8 women);diabetes mellitus;dyslipidemia;hypertension;sedentary lifestyle     Objective:    There were no vitals filed for this visit. There is no height or weight on file to calculate BMI.     06/17/2023    1:20 PM 09/16/2022    1:18 PM 07/29/2022    1:15 PM 06/28/2022    1:00 AM 06/24/2022    7:14 AM 06/23/2022    5:46 PM 06/15/2022   10:06 AM  Advanced Directives  Does Patient Have a Medical Advance Directive? No Yes Yes Yes Yes Yes Yes  Type of Furniture conservator/restorer;Living will Healthcare Power of [place];Living will Healthcare Power of [place];Living will Healthcare Power of [employer] Power of [employer] Power of [place];Living will  Does patient want to make changes to medical advance directive?    No - Patient declined No - Patient declined  No -  Patient declined  Copy of Healthcare Power of Attorney in Chart?    No - copy requested No - copy requested  No - copy requested  Would patient like information on creating a medical advance directive? No - Patient declined   No - Patient declined No - Patient declined  No - Patient declined    Current Medications (verified) Outpatient Encounter Medications as of 06/17/2023  Medication Sig   Accu-Chek Softclix Lancets lancets    alendronate (FOSAMAX) 70 MG tablet TAKE WITH A FULL GLASS OF WATER ON AN EMPTY STOMACH.   amLODipine (NORVASC) 5 MG tablet Take 1 tablet (5 mg total) by mouth at bedtime.   atorvastatin (LIPITOR) 80 MG tablet Take 1 tablet (80 mg total) by mouth daily.   BD PEN NEEDLE NANO 2ND GEN 32G X 4 MM MISC USE 1 EACH 4 (FOUR) TIMES DAILY   blood glucose meter kit and supplies    Calcium Carb-Cholecalciferol 600-10 MG-MCG TABS Take 1 tablet by mouth 2 (two) times daily.   Continuous Glucose Sensor (DEXCOM G7 SENSOR) MISC USE 1 EACH EVERY 10 (TEN) DAYS   CVS ALCOHOL SWABS PADS USE 4 TIMES DAILY TO WIPE SKIN BEFORE USING INSULIN   donepezil (ARICEPT) 10 MG tablet Take 1 tablet (10 mg total) by mouth at bedtime.   Glucagon (BAQSIMI ONE PACK) 3 MG/DOSE POWD    insulin aspart (NOVOLOG) 100 UNIT/ML FlexPen Inject 6-10 Units into the skin 3 (three) times daily with meals.   letrozole (FEMARA) 2.5 MG tablet TAKE 1 TABLET BY MOUTH EVERY DAY   losartan-hydrochlorothiazide (HYZAAR) 100-12.5 MG tablet Take 1  tablet by mouth daily before breakfast.   polyethylene glycol (MIRALAX / GLYCOLAX) 17 g packet Take 17 g by mouth daily.   TRESIBA FLEXTOUCH 100 UNIT/ML FlexTouch Pen daily.   No facility-administered encounter medications on file as of 06/17/2023.    Allergies (verified) Colesevelam hcl, Daucus carota, and Penicillins   History: Past Medical History:  Diagnosis Date   Arthritis    Atherosclerosis of aorta (HCC)    CKD (chronic kidney disease) stage 3, GFR 30-59 ml/min (HCC)     Dementia (HCC)    Diabetes mellitus without complication (HCC)    Dysrhythmia    Heart murmur    Hypertension    Type 1 diabetes (HCC)    Past Surgical History:  Procedure Laterality Date   AXILLARY LYMPH NODE BIOPSY Left 04/07/2022   bx done, hydromarker placed, path pending   BREAST BIOPSY Left 04/07/2022   [place] bx mass, ribbon marker, path pending   BREAST BIOPSY Left 04/07/2022   [place] LT BREAST BX W LOC DEV 1ST LESION IMG BX SPEC US GUIDE 04/07/2022 [facility]   BREAST EXCISIONAL BIOPSY Left    neg   BREAST SURGERY  1970   EYE SURGERY     detached retina   MASTECTOMY MODIFIED RADICAL Left 05/25/2022   Procedure: MASTECTOMY MODIFIED RADICAL;  Surgeon: Carolan Shiver, MD;  Location: [facility];  Service: General;  Laterality: Left;   Family History  Problem Relation Age of Onset   Alzheimer's disease Mother    Diabetes Mother    Arthritis Mother    Diabetes Father    Hypertension Father    Hyperlipidemia Father    Stroke Brother    Multiple sclerosis Daughter    Social History   Socioeconomic History   Marital status: Married    Spouse name: [name]   Number of children: 3   Years of education: Not on file   Highest education level: 12th grade  Occupational History   Occupation: Retired  Tobacco Use   Smoking status: Never   Smokeless tobacco: Never   Tobacco comments:    smoking cessation materials not required  Vaping Use   Vaping status: Never Used  Substance and Sexual Activity   Alcohol use: No    Alcohol/week: 0.0 standard drinks of alcohol   Drug use: No   Sexual activity: Not Currently  Other Topics Concern   Not on file  Social History Narrative   Not on file   Social Drivers of Health   Financial Resource Strain: Low Risk  (06/17/2023)   Overall Financial Resource Strain (CARDIA)    Difficulty of Paying Living Expenses: Not hard at all  Food Insecurity: No Food Insecurity (06/17/2023)   Hunger Vital Sign    Worried About Radiation protection practitioner  of Food in the Last Year: Never true    Ran Out of Food in the Last Year: Never true  Transportation Needs: No Transportation Needs (06/17/2023)   PRAPARE - Administrator, Civil Service (Medical): No    Lack of Transportation (Non-Medical): No  Physical Activity: Inactive (06/17/2023)   Exercise Vital Sign    Days of Exercise per Week: 0 days    Minutes of Exercise per Session: 0 min  Stress: No Stress Concern Present (06/17/2023)   [employer] of Occupational Health - Occupational Stress Questionnaire    Feeling of Stress : Not at all  Social Connections: Moderately Isolated (06/17/2023)   Social Connection and Isolation Panel [NHANES]    Frequency of Communication  with Friends and Family: Twice a week    Frequency of Social Gatherings with Friends and Family: More than three times a week    Attends Religious Services: Never    Database administrator or Organizations: No    Attends Engineer, structural: Never    Marital Status: Married    Tobacco Counseling Counseling given: Not Answered Tobacco comments: smoking cessation materials not required    Clinical Intake:  Pre-visit preparation completed: Yes  Pain : No/denies pain     BMI - recorded: 24.3 Nutritional Status: BMI of 19-24  Normal Nutritional Risks: None Diabetes: Yes CBG done?: No Did pt. bring in CBG monitor from home?: No  Lab Results  Component Value Date   HGBA1C 6.5 (H) 06/23/2022   HGBA1C 7.8 09/23/2021   HGBA1C 7.5 04/15/2021     How often do you need to have someone help you when you read instructions, pamphlets, or other written materials from your doctor or pharmacy?: 1 - Never  Interpreter Needed?: No  Information entered by :: Kennedy Bucker, LPN   Activities of Daily Living    06/17/2023    1:20 PM 10/05/2022    2:25 PM  In your present state of health, do you have any difficulty performing the following activities:  Hearing? 0 0  Vision? 0 1  Difficulty  concentrating or making decisions? 1 1  Walking or climbing stairs? 1 1  Dressing or bathing? 1 1  Doing errands, shopping? 1 1  Preparing Food and eating ? Y   Using the Toilet? Y   In the past six months, have you accidently leaked urine? N   Do you have problems with loss of bowel control? N   Managing your Medications? Y   Managing your Finances? Y   Housekeeping or managing your Housekeeping? Y     Patient Care Team: Alba Cory, MD as PCP - General (Family Medicine) Nevada Crane, MD as Consulting Physician (Ophthalmology) Sherlon Handing, MD as Consulting Physician (Internal Medicine) Hulen Luster, RN as Oncology Nurse Navigator  Indicate any recent Medical Services you may have received from other than [facility] providers in the past year (date may be approximate).     Assessment:   This is a routine wellness examination for [place].  Hearing/Vision screen Hearing Screening - Comments:: NO AIDS Vision Screening - Comments:: WEARS GLASSES ALL THE TIME- [facility]   Goals Addressed             This Visit's Progress    DIET - EAT MORE FRUITS AND VEGETABLES         Depression Screen     06/17/2023    1:18 PM 03/12/2023    2:13 PM 10/05/2022    2:25 PM 04/14/2022    3:01 PM 03/24/2022    2:14 PM 01/27/2022    1:12 PM 07/29/2021    1:21 PM  PHQ 2/9 Scores  PHQ - 2 Score 0 0 0 0 0 0 0  PHQ- 9 Score 0 0 0 0 0 0     Fall Risk     06/17/2023    1:20 PM 03/12/2023    2:03 PM 10/05/2022    2:33 PM 03/24/2022    2:14 PM 01/27/2022    1:11 PM  Fall Risk   Falls in the past year? 0 0 0 0 1  Number falls in past yr: 0 0 0 0 0  Injury with Fall? 0 0 0 0  0  Risk for fall due to : No Fall Risks No Fall Risks Impaired balance/gait;Impaired mobility  Impaired balance/gait  Follow up Falls prevention discussed;Falls evaluation completed Falls prevention discussed;Education provided;Falls evaluation completed Falls prevention discussed  Falls prevention discussed     MEDICARE RISK AT HOME:  Medicare Risk at Home Any stairs in or around the home?: Yes If so, are there any without handrails?: No Home free of loose throw rugs in walkways, pet beds, electrical cords, etc?: Yes Adequate lighting in your home to reduce risk of falls?: Yes Life alert?: No Use of a cane, walker or w/c?: Yes (ROLLATOR ALL THE TIME) Grab bars in the bathroom?: No Shower chair or bench in shower?: No Elevated toilet seat or a handicapped toilet?: Yes  TIMED UP AND GO:  Was the test performed?  No  Cognitive Function: PT UNABLE D/T DEMENTIA        05/29/2022    2:31 PM 10/07/2017    2:37 PM 09/28/2016    3:37 PM  6CIT Screen  What Year? -- 4 points 0 points  What month?  3 points 0 points  What time?  0 points 3 points  Count back from 20  0 points 0 points  Months in reverse  0 points 0 points  Repeat phrase  6 points 10 points  Total Score  13 points 13 points    Immunizations Immunization History  Administered Date(s) Administered   Fluad Quad(high Dose 65+) 11/15/2018, 12/21/2019, 01/27/2022   Fluad Trivalent(High Dose 65+) 02/08/2023   Hepatitis B, PED/ADOLESCENT 04/15/2006, 05/13/2006, 08/24/2006   Influenza, High Dose Seasonal PF 04/01/2016, 12/23/2016, 12/28/2017, 12/30/2020   Influenza,inj,Quad PF,6+ Mos 01/01/2015   Influenza-Unspecified 11/27/2013, 12/08/2018   Moderna Sars-Covid-2 Vaccination 04/21/2019, 05/19/2019, 01/02/2020   PFIZER(Purple Top)SARS-COV-2 Vaccination 05/05/2019, 05/31/2019   Pfizer Covid-19 Vaccine Bivalent Booster 97yrs & up 08/28/2020   Pneumococcal Conjugate-13 03/29/2014, 04/12/2014   Pneumococcal Polysaccharide-23 10/03/2009, 06/14/2015   Tdap 10/29/2011    Screening Tests Health Maintenance  Topic Date Due   Zoster Vaccines- Shingrix (1 of 2) Never done   OPHTHALMOLOGY EXAM  02/13/2022   COVID-19 Vaccine (7 - 2024-25 season) 11/08/2022   HEMOGLOBIN A1C  12/23/2022   DTaP/Tdap/Td (2 - Td or Tdap) 03/11/2024  (Originally 10/28/2021)   FOOT EXAM  10/05/2023   INFLUENZA VACCINE  10/08/2023   Diabetic kidney evaluation - eGFR measurement  12/18/2023   Diabetic kidney evaluation - Urine ACR  03/11/2024   Medicare Annual Wellness (AWV)  06/16/2024   DEXA SCAN  11/22/2024   Pneumonia Vaccine 68+ Years old  Completed   HPV VACCINES  Aged Out   Meningococcal B Vaccine  Aged Out   Colonoscopy  Discontinued   Hepatitis C Screening  Discontinued    Health Maintenance  Health Maintenance Due  Topic Date Due   Zoster Vaccines- Shingrix (1 of 2) Never done   OPHTHALMOLOGY EXAM  02/13/2022   COVID-19 Vaccine (7 - 2024-25 season) 11/08/2022   HEMOGLOBIN A1C  12/23/2022   Health Maintenance Items Addressed: UP TO DATE ON SHOTS EXCEPT PNA & SHINGRIX- UP TO DATE BONE DENSITY  Additional Screening:  Vision Screening: Recommended annual ophthalmology exams for early detection of glaucoma and other disorders of the eye.  Dental Screening: Recommended annual dental exams for proper oral hygiene  Community Resource Referral / Chronic Care Management: CRR required this visit?  No   CCM required this visit?  No     Plan:     I have  personally reviewed and noted the following in the patient's chart:   Medical and social history Use of alcohol, tobacco or illicit drugs  Current medications and supplements including opioid prescriptions. Patient is not currently taking opioid prescriptions. Functional ability and status Nutritional status Physical activity Advanced directives List of other physicians Hospitalizations, surgeries, and ER visits in previous 12 months Vitals Screenings to include cognitive, depression, and falls Referrals and appointments  In addition, I have reviewed and discussed with patient certain preventive protocols, quality metrics, and best practice recommendations. A written personalized care plan for preventive services as well as general preventive health  recommendations were provided to patient.     Hal Hope, LPN   1/32/4401   After Visit Summary: (MyChart) Due to this being a telephonic visit, the after visit summary with patients personalized plan was offered to patient via MyChart   Notes: Nothing significant to report at this time.

## 2023-06-17 NOTE — Patient Instructions (Addendum)
 [name] , Thank you for taking time to come for your Medicare Wellness Visit. I appreciate your ongoing commitment to your health goals. Please review the following plan we discussed and let me know if I can assist you in the future.   Referrals/Orders/Follow-Ups/Clinician Recommendations: NONE  This is a list of the screening recommended for you and due dates:  Health Maintenance  Topic Date Due   Zoster (Shingles) Vaccine (1 of 2) Never done   Eye exam for diabetics  02/13/2022   COVID-19 Vaccine (7 - 2024-25 season) 11/08/2022   Hemoglobin A1C  12/23/2022   DTaP/Tdap/Td vaccine (2 - Td or Tdap) 03/11/2024*   Complete foot exam   10/05/2023   Flu Shot  10/08/2023   Yearly kidney function blood test for diabetes  12/18/2023   Yearly kidney health urinalysis for diabetes  03/11/2024   Medicare Annual Wellness Visit  06/16/2024   DEXA scan (bone density measurement)  11/22/2024   Pneumonia Vaccine  Completed   HPV Vaccine  Aged Out   Meningitis B Vaccine  Aged Out   Colon Cancer Screening  Discontinued   Hepatitis C Screening  Discontinued  *Topic was postponed. The date shown is not the original due date.    Advanced directives: (ACP Link)Information on Advanced Care Planning can be found at [facility] [place] Advance Health Care Directives Advance Health Care Directives. [email or web address]   Next Medicare Annual Wellness Visit scheduled for next year: Yes  06/22/24 @ 3:50 PM BY PHONE

## 2023-06-21 ENCOUNTER — Inpatient Hospital Stay: Payer: Medicare HMO | Attending: Oncology | Admitting: Oncology

## 2023-06-21 ENCOUNTER — Encounter: Payer: Self-pay | Admitting: Oncology

## 2023-06-21 VITALS — BP 124/60 | HR 63 | Temp 96.5°F | Resp 16 | Wt 139.7 lb

## 2023-06-21 DIAGNOSIS — Z7983 Long term (current) use of bisphosphonates: Secondary | ICD-10-CM

## 2023-06-21 DIAGNOSIS — Z79811 Long term (current) use of aromatase inhibitors: Secondary | ICD-10-CM | POA: Insufficient documentation

## 2023-06-21 DIAGNOSIS — Z79899 Other long term (current) drug therapy: Secondary | ICD-10-CM

## 2023-06-21 DIAGNOSIS — Z170 Estrogen receptor positive status [ER+]: Secondary | ICD-10-CM | POA: Insufficient documentation

## 2023-06-21 DIAGNOSIS — C50112 Malignant neoplasm of central portion of left female breast: Secondary | ICD-10-CM | POA: Diagnosis present

## 2023-06-21 DIAGNOSIS — M810 Age-related osteoporosis without current pathological fracture: Secondary | ICD-10-CM | POA: Insufficient documentation

## 2023-06-21 DIAGNOSIS — Z5181 Encounter for therapeutic drug level monitoring: Secondary | ICD-10-CM | POA: Diagnosis not present

## 2023-06-21 DIAGNOSIS — Z853 Personal history of malignant neoplasm of breast: Secondary | ICD-10-CM

## 2023-06-21 DIAGNOSIS — Z08 Encounter for follow-up examination after completed treatment for malignant neoplasm: Secondary | ICD-10-CM | POA: Diagnosis not present

## 2023-06-21 NOTE — Progress Notes (Signed)
 Caregiver stated diabetes doctor is concerned about patient being on Fosamax for over 5 years and may need to consider switching to something else.

## 2023-06-21 NOTE — Progress Notes (Signed)
 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  Mammogram showed hypoechoic mass measuring 3.1 x 1.9 x 3.4 cm in the left retroareolar breast at 2 o'clock position with invasion into the dermis and retroareolar breast.  2 enlarged left axillary lymph nodes.  No mammographic evidence of malignancy in the right breast.  Patient had a left breast biopsy as well as biopsy of the left axillary lymph which showed invasive mammary carcinoma grade 310 mm in the primary breast specimen and 7 mm in the lymph node.  Tumor was ER greater than 90% positive, PR 60% positive and HER2 negative.   Patient is here with her daughter today who is her healthcare power of attorney.  Patient has advanced dementia and is dependent on her family members for ADLs including dressing and ambulation to some extent.  She is incontinent.  She has not had any recent infections or hospitalizations.   Final pathology showed 3.2 cm grade 3 invasive mammary carcinoma with negative margins.  3 out of 15 lymph nodes were positive for malignancy.   Patient had started letrozole briefly but was held due to abnormal LFTs which were noticed at the time of her  cholecystitis.  She completed adjuvant radiation and restarted letrozole    Interval history-patient's dementia has overall remained stable.  She is able to feed herself.  Does not verbalize much.  She is incontinent  ECOG PS- 3 Pain scale- 0   Review of systems- Review of Systems  Unable to perform ROS: Dementia      Allergies  Allergen Reactions   Colesevelam Hcl     scotomas   Daucus Carota    Penicillins Itching     Past Medical History:  Diagnosis Date   Arthritis    Atherosclerosis of aorta (HCC)    CKD (chronic kidney disease) stage 3, GFR 30-59 ml/min (HCC)    Dementia (HCC)    Diabetes mellitus without complication (HCC)    Dysrhythmia    Heart murmur    Hypertension    Type 1 diabetes (HCC)      Past Surgical History:  Procedure Laterality Date   AXILLARY LYMPH NODE BIOPSY Left 04/07/2022   bx done, hydromarker placed, path pending   BREAST BIOPSY Left 04/07/2022   [place] bx mass, ribbon marker, path pending   BREAST BIOPSY Left 04/07/2022   [place] LT BREAST BX W LOC DEV 1ST LESION IMG BX SPEC US GUIDE 04/07/2022 [facility]   BREAST EXCISIONAL BIOPSY Left    neg   BREAST SURGERY  1970   EYE SURGERY     detached retina   MASTECTOMY MODIFIED RADICAL Left 05/25/2022   Procedure: MASTECTOMY MODIFIED RADICAL;  Surgeon: Carolan Shiver, MD;  Location: [facility] ORS;  Service: General;  Laterality: Left;    Social History  Socioeconomic History   Marital status: Married    Spouse name: Sherilyn Cooter   Number of children: 3   Years of education: Not on file   Highest education level: 12th grade  Occupational History   Occupation: Retired  Tobacco Use   Smoking status: Never   Smokeless tobacco: Never   Tobacco comments:    smoking cessation materials not required  Vaping Use   Vaping status: Never Used  Substance and Sexual Activity   Alcohol use: No    Alcohol/week: 0.0 standard drinks of alcohol   Drug use: No   Sexual activity: Not Currently   Other Topics Concern   Not on file  Social History Narrative   Not on file   Social Drivers of Health   Financial Resource Strain: Low Risk  (06/17/2023)   Overall Financial Resource Strain (CARDIA)    Difficulty of Paying Living Expenses: Not hard at all  Food Insecurity: No Food Insecurity (06/17/2023)   Hunger Vital Sign    Worried About Running Out of Food in the Last Year: Never true    Ran Out of Food in the Last Year: Never true  Transportation Needs: No Transportation Needs (06/17/2023)   PRAPARE - Administrator, Civil Service (Medical): No    Lack of Transportation (Non-Medical): No  Physical Activity: Inactive (06/17/2023)   Exercise Vital Sign    Days of Exercise per Week: 0 days    Minutes of Exercise per Session: 0 min  Stress: No Stress Concern Present (06/17/2023)   [employer] of Occupational Health - Occupational Stress Questionnaire    Feeling of Stress : Not at all  Social Connections: Moderately Isolated (06/17/2023)   Social Connection and Isolation Panel [NHANES]    Frequency of Communication with Friends and Family: Twice a week    Frequency of Social Gatherings with Friends and Family: More than three times a week    Attends Religious Services: Never    Database administrator or Organizations: No    Attends Banker Meetings: Never    Marital Status: Married  Catering manager Violence: Not At Risk (06/17/2023)   Humiliation, Afraid, Rape, and Kick questionnaire    Fear of Current or Ex-Partner: No    Emotionally Abused: No    Physically Abused: No    Sexually Abused: No    Family History  Problem Relation Age of Onset   Alzheimer's disease Mother    Diabetes Mother    Arthritis Mother    Diabetes Father    Hypertension Father    Hyperlipidemia Father    Stroke Brother    Multiple sclerosis Daughter      Current Outpatient Medications:    Accu-Chek Softclix Lancets lancets, , Disp: , Rfl:    alendronate (FOSAMAX)  70 MG tablet, TAKE WITH A FULL GLASS OF WATER ON AN EMPTY STOMACH., Disp: 12 tablet, Rfl: 1   amLODipine (NORVASC) 5 MG tablet, Take 1 tablet (5 mg total) by mouth at bedtime., Disp: 90 tablet, Rfl: 1   atorvastatin (LIPITOR) 80 MG tablet, Take 1 tablet (80 mg total) by mouth daily., Disp: 90 tablet, Rfl: 3   BD PEN NEEDLE NANO 2ND GEN 32G X 4 MM MISC, USE 1 EACH 4 (FOUR) TIMES DAILY, Disp: , Rfl:    letrozole (FEMARA) 2.5 MG tablet, TAKE 1 TABLET BY MOUTH EVERY DAY, Disp: 90 tablet, Rfl: 1   losartan-hydrochlorothiazide (HYZAAR) 100-12.5 MG tablet, Take 1 tablet by mouth daily before breakfast.,  Disp: 90 tablet, Rfl: 1   polyethylene glycol (MIRALAX / GLYCOLAX) 17 g packet, Take 17 g by mouth daily., Disp: 14 each, Rfl: 0   blood glucose meter kit and supplies, , Disp: , Rfl:    Calcium Carb-Cholecalciferol 600-10 MG-MCG TABS, Take 1 tablet by mouth 2 (two) times daily., Disp: , Rfl:    Continuous Glucose Sensor (DEXCOM G7 SENSOR) MISC, USE 1 EACH EVERY 10 (TEN) DAYS, Disp: , Rfl:    [employer] ALCOHOL SWABS PADS, USE 4 TIMES DAILY TO WIPE SKIN BEFORE USING INSULIN, Disp: 400 each, Rfl: 2   donepezil (ARICEPT) 10 MG tablet, Take 1 tablet (10 mg total) by mouth at bedtime., Disp: 90 tablet, Rfl: 1   Glucagon (BAQSIMI ONE PACK) 3 MG/DOSE POWD, , Disp: , Rfl:    insulin aspart (NOVOLOG) 100 UNIT/ML FlexPen, Inject 6-10 Units into the skin 3 (three) times daily with meals., Disp: , Rfl:    TRESIBA FLEXTOUCH 100 UNIT/ML FlexTouch Pen, daily., Disp: , Rfl:   Physical exam:  Vitals:   06/21/23 1334  BP: 124/60  Pulse: 63  Resp: 16  Temp: (!) 96.5 F (35.8 C)  SpO2: 100%  Weight: 139 lb 11.2 oz (63.4 kg)   Physical Exam Cardiovascular:     Rate and Rhythm: Normal rate and regular rhythm.     Heart sounds: Normal heart sounds.  Pulmonary:     Effort: Pulmonary effort is normal.     Breath sounds: Normal breath sounds.  Skin:    General: Skin is warm and dry.  Neurological:     Mental Status:  She is alert.     Comments: Oriented self alone   Breast exam somewhat limited as patient is sitting on a wheelchair unable to sit on the examination table.  She is s/p left mastectomy without reconstruction and no evidence of chest wall recurrence.  No palpable bilateral axillary adenopathy.  No palpable masses in the right breast.  I have personally reviewed labs listed below:    Latest Ref Rng & Units 12/18/2022    1:46 PM  CMP  Glucose 70 - 99 mg/dL 161   BUN 8 - 23 mg/dL 14   Creatinine 0.96 - 1.00 mg/dL 0.45   Sodium 409 - 811 mmol/L 135   Potassium 3.5 - 5.1 mmol/L 4.0   Chloride 98 - 111 mmol/L 102   CO2 22 - 32 mmol/L 26   Calcium 8.9 - 10.3 mg/dL 9.1   Total Protein 6.5 - 8.1 g/dL 7.2   Total Bilirubin 0.3 - 1.2 mg/dL 0.5   Alkaline Phos 38 - 126 U/L 63   AST 15 - 41 U/L 19   ALT 0 - 44 U/L 16       Latest Ref Rng & Units 08/17/2022    1:33 PM  CBC  WBC 4.0 - 10.5 K/uL 4.8   Hemoglobin 12.0 - 15.0 g/dL 91.4   Hematocrit 78.2 - 46.0 % 37.6   Platelets 150 - 400 K/uL 232      Assessment and plan- Patient is a 83 y.o. female with h/o stage 2 ER PR positive her 2 negative left breast cancer s/p mastectomy, adjuvant radiation and currently on letrozole.  She is here for routine follow-up visit  Clinically patient is doing well with no signs and symptoms of recurrence based on today's exam.  Given her ongoing dementia when not getting any surveillance mammograms.  She started letrozole in April 2024 and will continue for 5 years if  tolerated.  Patient has baseline osteoporosis and has been on Fosamax for over 5 years now.  Will plan to give her a break from Fosamax and she does not have to take that any further.  We will plan to repeat a bone density scan again next year.  If there is any worsening of her osteoporosis I will consider starting her on Prolia or Reclast at that time  I will see her back in 6 months no labs   Visit Diagnosis 1. Encounter for follow-up  surveillance of breast cancer   2. Visit for monitoring Arimidex therapy   3. Age related osteoporosis, unspecified pathological fracture presence   4. Long term (current) use of bisphosphonates   5. High risk medication use      Dr. Seretha Dance, MD, MPH [facility] at [facility] [phone] 06/21/2023 2:19 PM

## 2023-08-10 ENCOUNTER — Other Ambulatory Visit: Payer: Self-pay | Admitting: Family Medicine

## 2023-08-10 DIAGNOSIS — M810 Age-related osteoporosis without current pathological fracture: Secondary | ICD-10-CM

## 2023-09-02 LAB — HEMOGLOBIN A1C: Hemoglobin A1C: 6.8

## 2023-09-02 NOTE — Progress Notes (Addendum)
 HPI:  [name] is a 83 y.o. female who presents for follow up diabetes.  She was diagnosed with diabetes more than 30 years ago.  Most of the history is through her husband and daughter who seems to be in charge of her health care and are with her today.  I last saw her in 1/25.  She has done well since that time.  She is accompanied today with her husband and daughter.   She currently takes 22 units of Tresiba  and Novolog , 10 units of with breakfast, 6 units with lunch, and 8 units with supper (If over 225 before the meal, she takes an extra 2 units).      She uses the Dexcom for CGM.  It was downloaded and reviewed.  Her 14 day average is 171 with SD of 52.  Her TIR is 61%.  Review of her diet shows that she avoids concentrated carbohydrates.  She does not really do any exercise.  She denies lower extremity pain/numbness/burning.  They are both concerned about her diabetes.  She also has osteoporosis and was diagnosed in 2019.  She was started on Fosamax  in 10/19 and has stopped it in 4/25.  She is on Ca supplementation daily.  She also takes separate D supplementation.  She has not had any frxs since last visit.  They are concerned about her bones.   ROS:  No chest pain.  No shortness of breath.  Medical History: Past Medical History:  Diagnosis Date   Carpal tunnel syndrome    Dyslipidemia    Hypercholesterolemia    Hypertension    Incontinence    Lumbago    Menopause    Mild cognitive impairment with memory loss    Osteoporosis    Type 2 diabetes mellitus (CMS/HHS-HCC)    Vitamin D  deficiency     Surgical History: Past Surgical History:  Procedure Laterality Date   MASTECTOMY PARTIAL / LUMPECTOMY  05/25/2022   ROBOT ASSISTED LAPAROSCOPIC CHOLECYSTECTOMY  06/25/2022   Dr Lucas Catchings   BREAST EXCISIONAL BIOPSY     CATARACT EXTRACTION     COLONOSCOPY      Social History:  reports that she has never smoked. She has never used smokeless tobacco. She  reports that she does not drink alcohol and does not use drugs.  She is married.  Family History: family history includes Alzheimer's disease in her mother; Diabetes in her father and mother; High blood pressure (Hypertension) in her father; Hyperlipidemia (Elevated cholesterol) in her father; Multiple sclerosis in her daughter; Myocardial Infarction (Heart attack) in her brother; Osteoarthritis in her mother; Stroke in her brother.  Medications: Current Outpatient Medications  Medication Sig Dispense Refill   amLODIPine  (NORVASC ) 5 MG tablet Take 5 mg by mouth once daily        atorvastatin  (LIPITOR) 80 MG tablet Take 80 mg by mouth every evening.  1   BD INSULIN  SYRINGE ULTRA-FINE 0.3 mL 31 x 5/16 syringe   4   blood glucose diagnostic (ACCU-CHEK AVIVA PLUS TEST STRP) test strip Use 3 times daily dx e10.65 300 strip 2   blood-glucose meter,continuous (DEXCOM G7 RECEIVER) Misc Use 1 Device continuously 1 each 0   blood-glucose sensor (DEXCOM G7 SENSOR) Devi Use 1 each every 10 (ten) days 3 each 12   calcium  carbonate-vitamin D3 (CALTRATE 600+D) 600 mg(1,500mg ) -400 unit tablet Take 2 tablets by mouth once daily     donepeziL  (ARICEPT  ODT) 10 MG disintegrating tablet Take 10 mg by mouth at  bedtime     glucagon  (BAQSIMI ) 3 mg/actuation nasal spray One spray into nose in case of severe hypoglycemia 3 each 1   hydrALAZINE  (APRESOLINE ) 10 MG tablet Take 1 tablet by mouth 3 (three) times a day     insulin  ASPART (NOVOLOG  FLEXPEN U-100 INSULIN ) pen injector (concentration 100 units/mL) INJECT 10 UNITS AT BREAKFAST AND LUNCH. INJECT 8 UNITS AT SUPPER. ADD SLIDING SCALE AS NECESSARY. MAX DAILY DOSE 50 UNITS. 15 mL 5   insulin  DEGLUDEC (TRESIBA  FLEXTOUCH U-100) pen injector (concentration 100 units/mL) Inject 22 Units subcutaneously once daily 15 mL 6   letrozole  (FEMARA ) 2.5 mg tablet Take by mouth     losartan -hydrochlorothiazide  (HYZAAR) 100-12.5 mg tablet Take 1 tablet by mouth once  daily     ondansetron  (ZOFRAN -ODT) 4 MG disintegrating tablet Take 1 tablet (4 mg total) by mouth every 8 (eight) hours as needed 10 tablet 0   pen needle, diabetic (BD NANO 2ND GEN PEN NEEDLE) 32 gauge x 5/32 Ndle USE 1 EACH 4 (FOUR) TIMES DAILY 400 each 3   polyethylene glycol (MIRALAX ) packet Take 17 g by mouth once daily     alendronate  (FOSAMAX ) 70 MG tablet Take 1 tablet (70 mg total) by mouth once a week (Patient not taking: Reported on 09/02/2023) 4 tablet 12   lancets Use 1 each 3 (three) times daily E10.65 300 each 2   No current facility-administered medications for this visit.    Allergies: Allergies  Allergen Reactions   Carrot Extract Other (See Comments)   Colesevelam Hcl Unknown    scotomas   Penicillins Itching and Swelling    Physical Exam: Vitals:   09/02/23 1410  BP: 124/70  Pulse: 62  SpO2: 98%  Weight: 58.1 kg (128 lb)  Height: 157.5 cm (5' 2)   Body mass index is 23.41 kg/m. Gen:  WDWN in NAD   Physical exam otherwise deferred due to coronavirus precautions.   Labs: 12/19/2015: A1c = 9.5 06/18/2016: A1c = 9.6 11/18/2016: Bone density shows T score in the lumbar spine = -3.2, left proximal femur = -2.4 12/17/2016: A1c = 8.8.  Microalbumin = 9.1 06/15/2017: A1c = 8.8 10/12/2017: Microalbumin = 6.  Cholesterol = 250/70/66/167.  TSH = 2.13. 12/15/2017: A1c = 9.9.  GAD Ab=58.5.  Chol=152/68/41.9/97. 04/15/2018: A1c = 8.5 08/10/2018: A1c = 7.5 11/15/2018: A1c = 7.6.  K/Cr/Ca = 4.2/.0.76/9.7.  Chol. = 177/63/50/112.  LFTs nl.    05/15/2019: A1c =8.5.  K/Cr/Ca = 3.6/0.61/10.2, glucose = 47.  LFTs nl.    09/08/2019: K/Cr/Ca = 4.0/0.95/9.6.  Cholesterol = 154/96/47/88.  LFTs normal.  Vitamin D  = 27. 10/18/2019: A1c = 8.6 02/21/2020: A1c = 8.6  06/20/2020: A1c = 9.7.  06/20/2020: Bone Density w/ T scores in the LS = -2.8, LFN = -2.0, LPF = -2.0.  Frax score = 1.7/6.6.  12/30/2020:  A1c = 8.7.  K/Cr/Ca = 3.9/0.67/9.7.  MA = 6.  Chol = 132/63/35/83.  LFTs nl.  D =  45.  04/15/2021:  A1c = 7.5 09/23/2021:  A1c = 7.8.  01/27/2022:  K/Cr/Ca = 3.8/0.71/9.6.  MA = 5.  Chol = 119/91/35/67.  LFTs nl except for ALT = 40.  TSH = 0.75.  02/10/2022:  A1c = 6.7.  05/07/2022:  K/Cr/Ca = 3.9/0.7/10.  LFTs nl.  05/26/2022:  K/Cr/Ca = 3.2/0.46/7.4.   06/23/2022:  A1c = 7.1 09/16/2022:  K/Cr/Ca = 3.7/0.73/8.7.  LFTs nl.   10/27/2022:  A1c = 7.0.  11/23/2022:  BMD with Tscores in the LS = -  3.1,  LFN = -2.9 (-1.3 in 9/18),  LPF = -2.4 (-2.2 in 9/18).  12/18/2022:  K/Cr/Ca = 4/0.71/9.1.  LFTs nl.   03/12/2023:  MA < 0.2. 03/30/2023: A1c = 7.1.  K/Cr/Ca = 4.3/0.7/9.5.  Microalbumin <7.  Cholesterol = 173/75/46.8/111.  LFTs normal.  TSH = 1.559.  Vitamin D  =40.6. 09/02/2023: A1c = 6.8.    Assessment/Plan:  1.  Type I diabetes.  Her A1c today is 6.8.  Based on review of her CGM, I will make no changes to her regimen.  I encouraged continued lifestyle modifications.    2.  Hypoglycemia.  She had a severe low and needed EMS early in 2022.  She is on a sensor to prevent future lows.    3.  Hyperlipidemia associated with diabetes.  Her LDL was higher in 1/25 it looks like she has been of atorvastatin  based on her refills but is back on it.  Her lipids were ok in 1/25 on 80 mg atorvastatin  daily.    4.  Osteoporosis.  She had osteoporosis diagnosed in 2019 and was started on Fosamax  in 10/19.  Bone density in 9/24 showed worsening as compared to BMDs in 4/22 and in 9/18.  Last visit we dicussed stopping fosamax  since she has bene on it for 5 years.  Her daughter wanted to dicussed with her oncologist and her oncologist was ok with it.  I will plan to give her a tx holiday in 2 years.  I will plan another BMD in the fall of 2026.  I encouraged her to continue her Ca/D supplementation.  5.  Hypertension associated with diabetes.  Her blood pressure is good today on multiple agents including metoprolol .    6.  Retinopathy.  She has had laser surgery in the past.  We last got a copy from the  eye doctor ( eye center) on 04/16/22.  She has proliferative diabetic retinopathy that is inactive so no trx is necessary.  I encouraged them to make an appointment.  When she is seen, I told her to have them send me a copy of the report.    7.  Prophylaxis.  She last saw the eye doctor in 2/24 as above.  I will defer a foot exam as her husband checks her feet everyday.  8.  She will return to clinic in 6 months.   03/17/24:  Labs rec'd from Dr. Glenard.  Has f/u 1/20 so will review then.   This note is partially prepared by Earla Daria Messier, Scribe, in the presence of and acting as the scribe of Dr. Debby Breaker , MD.     [facility], MD

## 2023-09-13 ENCOUNTER — Ambulatory Visit: Payer: Self-pay | Admitting: Family Medicine

## 2023-09-13 ENCOUNTER — Encounter: Payer: Self-pay | Admitting: Family Medicine

## 2023-09-13 VITALS — BP 116/68 | HR 67 | Resp 16 | Ht 62.0 in | Wt 131.7 lb

## 2023-09-13 DIAGNOSIS — F03918 Unspecified dementia, unspecified severity, with other behavioral disturbance: Secondary | ICD-10-CM | POA: Diagnosis not present

## 2023-09-13 DIAGNOSIS — R2681 Unsteadiness on feet: Secondary | ICD-10-CM

## 2023-09-13 DIAGNOSIS — M810 Age-related osteoporosis without current pathological fracture: Secondary | ICD-10-CM

## 2023-09-13 DIAGNOSIS — Z794 Long term (current) use of insulin: Secondary | ICD-10-CM

## 2023-09-13 DIAGNOSIS — I152 Hypertension secondary to endocrine disorders: Secondary | ICD-10-CM | POA: Diagnosis not present

## 2023-09-13 DIAGNOSIS — E785 Hyperlipidemia, unspecified: Secondary | ICD-10-CM | POA: Diagnosis not present

## 2023-09-13 DIAGNOSIS — I700 Atherosclerosis of aorta: Secondary | ICD-10-CM | POA: Diagnosis not present

## 2023-09-13 DIAGNOSIS — E1159 Type 2 diabetes mellitus with other circulatory complications: Secondary | ICD-10-CM

## 2023-09-13 MED ORDER — LOSARTAN POTASSIUM-HCTZ 100-12.5 MG PO TABS: 1.0000 | ORAL_TABLET | Freq: Every day | ORAL | 1 refills | Status: DC

## 2023-09-13 MED ORDER — AMLODIPINE BESYLATE 5 MG PO TABS: 5.0000 mg | ORAL_TABLET | Freq: Every evening | ORAL | 1 refills | Status: DC

## 2023-09-13 MED ORDER — DONEPEZIL HCL 10 MG PO TABS: 10.0000 mg | ORAL_TABLET | Freq: Every day | ORAL | 1 refills | Status: DC

## 2023-09-13 NOTE — Progress Notes (Addendum)
 Name: [name]   MRN: [id number]    DOB: 09/13/1940   Date:09/13/2023       Progress Note  Subjective  Chief Complaint  Chief Complaint  Patient presents with   Medical Management of Chronic Issues   Discussed the use of AI scribe software for clinical note transcription with the patient, who gave verbal consent to proceed.  History of Present Illness [name] is an 83 year old female with type 1 diabetes and hypertension who presents for a regular follow-up visit.  She has type 1 diabetes with microalbuminuria, dyslipidemia and HTN, managed with Novolog  on a sliding scale of 6 to 10 units before meals and Tresiba  at 22 units daily. Her blood sugar levels are well-controlled, with a recent A1c of 6.8%. Glucagon  is available at home for hypoglycemic episodes, though there have been no recent episodes.  Hypertension is managed with losartan  100/12.5 mg and amlodipine  5 mg. Her blood pressure has not been checked at home recently, but she reports no dizziness, chest pain, or other symptoms.  For dyslipidemia, she is on atorvastatin  80 mg daily. Her LDL was 111 mg/dL in January 2025, which is above the target level, but she is already on the maximum dose of atorvastatin  and family does not want to add another medication to her current regiment   She has osteoporosis diagnosed in 2019 and was on Fosamax  for five years. She is currently on a drug holiday and will have a bone density test repeated in 2026. She maintains a high calcium  diet and takes vitamin D  supplements.  She has a history of retinopathy and has undergone laser surgery. Her last eye exam was in February 2024, and she has an appointment scheduled for August 2025. Her vision is reportedly stable, and she is able to read.  She has a history of a heart murmur and has been seen by a cardiologist in the past. No shortness of breath or wheezing.  She experiences some memory issues and sundowning but no significant behavioral  disturbances. She is on Aricept  for dementia  She has stability issues and uses a rollator at home but requests a front-wheeled walker for easier mobility, especially for getting in and out of the car.    Patient Active Problem List   Diagnosis Date Noted   Constipation 06/30/2022   Biliary colic 06/24/2022   Breast cancer s/p  unilateral modified radical mastectomy, left 05/2022 06/24/2022   Breast cancer (HCC) 05/25/2022   Genetic testing 05/01/2022   Malignant neoplasm of areola of left breast in female, estrogen receptor positive (HCC) 04/14/2022   Gait instability 07/29/2021   Atherosclerosis of aorta (HCC) 04/01/2016   Hypertension associated with diabetes (HCC) 09/19/2014   Carpal tunnel syndrome 09/19/2014   Detached retina 09/19/2014   Type 1 diabetes mellitus with microalbuminuria (HCC) 09/19/2014   Dyslipidemia 09/19/2014   Hypophosphatemia 09/19/2014   OP (osteoporosis) 09/19/2014   Menopause 09/19/2014   Ptosis of eyelid 09/19/2014   Urge incontinence 08/01/2013   Hyperlipidemia due to type 1 diabetes mellitus (HCC) 08/01/2013   Vitamin D  deficiency 02/24/2007    Past Surgical History:  Procedure Laterality Date   AXILLARY LYMPH NODE BIOPSY Left 04/07/2022   bx done, hydromarker placed, path pending   BREAST BIOPSY Left 04/07/2022   us  bx mass, ribbon marker, path pending   BREAST BIOPSY Left 04/07/2022   US  LT BREAST BX W LOC DEV 1ST LESION IMG BX SPEC US  GUIDE 04/07/2022 [facility]   BREAST EXCISIONAL BIOPSY  Left    neg   BREAST SURGERY  1970   EYE SURGERY     detached retina   MASTECTOMY MODIFIED RADICAL Left 05/25/2022   Procedure: MASTECTOMY MODIFIED RADICAL;  Surgeon: Rodolph Romano, MD;  Location: [facility];  Service: General;  Laterality: Left;    Family History  Problem Relation Age of Onset   Alzheimer's disease Mother    Diabetes Mother    Arthritis Mother    Diabetes Father    Hypertension Father    Hyperlipidemia Father     Stroke Brother    Multiple sclerosis Daughter     Social History   Tobacco Use   Smoking status: Never   Smokeless tobacco: Never   Tobacco comments:    smoking cessation materials not required  Substance Use Topics   Alcohol use: No    Alcohol/week: 0.0 standard drinks of alcohol     Current Outpatient Medications:    Accu-Chek Softclix Lancets lancets, , Disp: , Rfl:    atorvastatin  (LIPITOR) 80 MG tablet, Take 1 tablet (80 mg total) by mouth daily., Disp: 90 tablet, Rfl: 3   BD PEN NEEDLE NANO 2ND GEN 32G X 4 MM MISC, USE 1 EACH 4 (FOUR) TIMES DAILY, Disp: , Rfl:    blood glucose meter kit and supplies, , Disp: , Rfl:    Calcium  Carb-Cholecalciferol  600-10 MG-MCG TABS, Take 1 tablet by mouth 2 (two) times daily., Disp: , Rfl:    Continuous Glucose Sensor (DEXCOM G7 SENSOR) MISC, USE 1 EACH EVERY 10 (TEN) DAYS, Disp: , Rfl:    [employer] ALCOHOL SWABS PADS, USE 4 TIMES DAILY TO WIPE SKIN BEFORE USING INSULIN , Disp: 400 each, Rfl: 2   Glucagon  (BAQSIMI  ONE PACK) 3 MG/DOSE POWD, , Disp: , Rfl:    insulin  aspart (NOVOLOG ) 100 UNIT/ML FlexPen, Inject 6-10 Units into the skin 3 (three) times daily with meals., Disp: , Rfl:    letrozole  (FEMARA ) 2.5 MG tablet, TAKE 1 TABLET BY MOUTH EVERY DAY, Disp: 90 tablet, Rfl: 1   polyethylene glycol (MIRALAX  / GLYCOLAX ) 17 g packet, Take 17 g by mouth daily., Disp: 14 each, Rfl: 0   TRESIBA  FLEXTOUCH 100 UNIT/ML FlexTouch Pen, Inject 22 Units into the skin daily., Disp: , Rfl:    amLODipine  (NORVASC ) 5 MG tablet, Take 1 tablet (5 mg total) by mouth at bedtime., Disp: 90 tablet, Rfl: 1   donepezil  (ARICEPT ) 10 MG tablet, Take 1 tablet (10 mg total) by mouth at bedtime., Disp: 90 tablet, Rfl: 1   losartan -hydrochlorothiazide  (HYZAAR) 100-12.5 MG tablet, Take 1 tablet by mouth daily before breakfast., Disp: 90 tablet, Rfl: 1  Allergies  Allergen Reactions   Colesevelam Hcl     scotomas   Daucus Carota    Penicillins Itching    I personally reviewed  active problem list, medication list, allergies with the patient/caregiver today.   ROS  Ten systems reviewed and is negative except as mentioned in HPI    Objective Physical Exam  CONSTITUTIONAL: Patient appears well-developed and well-nourished. No distress. HEENT: Head atraumatic, normocephalic, neck supple. CARDIOVASCULAR: Normal rate, regular rhythm and normal heart sounds. 3/6 harsh systolic murmur. No BLE edema. PULMONARY: Effort normal and breath sounds normal. Lungs clear to auscultation, no wheezing, no cough. No respiratory distress. MUSCULOSKELETAL: slow gait, daughter had to assist her to get up from chair and used both hands to help her walk  PSYCHIATRIC: Patient has a normal mood and affect. Behavior is normal. Judgment and thought content normal.  Vitals:   09/13/23 1353  BP: 116/68  Pulse: 67  Resp: 16  SpO2: 95%  Weight: 131 lb 11.2 oz (59.7 kg)  Height: 5' 2 (1.575 m)    Body mass index is 24.09 kg/m.  Recent Results (from the past 2160 hours)  Hemoglobin A1c     Status: None   Collection Time: 09/02/23 12:00 AM  Result Value Ref Range   Hemoglobin A1C 6.8     Diabetic Foot Exam:     PHQ2/9:    09/13/2023    1:43 PM 06/17/2023    1:18 PM 03/12/2023    2:13 PM 10/05/2022    2:25 PM 04/14/2022    3:01 PM  Depression screen PHQ 2/9  Decreased Interest 0 0 0 0 0  Down, Depressed, Hopeless 0 0 0 0 0  PHQ - 2 Score 0 0 0 0 0  Altered sleeping  0 0 0 0  Tired, decreased energy  0 0 0 0  Change in appetite  0 0 0 0  Feeling bad or failure about yourself   0 0 0 0  Trouble concentrating  0 0 0 0  Moving slowly or fidgety/restless  0 0 0 0  Suicidal thoughts  0 0 0 0  PHQ-9 Score  0 0 0 0  Difficult doing work/chores  Not difficult at all Not difficult at all      phq 9 is negative  Fall Risk:    09/13/2023    1:43 PM 06/17/2023    1:20 PM 03/12/2023    2:03 PM 10/05/2022    2:33 PM 03/24/2022    2:14 PM  Fall Risk   Falls in the past year? 0 0 0 0  0  Number falls in past yr: 0 0 0 0 0  Injury with Fall? 0 0 0 0 0  Risk for fall due to : No Fall Risks No Fall Risks No Fall Risks Impaired balance/gait;Impaired mobility   Follow up Falls evaluation completed Falls prevention discussed;Falls evaluation completed Falls prevention discussed;Education provided;Falls evaluation completed Falls prevention discussed       Assessment & Plan Type 1 diabetes mellitus with retinopathy, dyslipidemia  and hypertension Diabetes well-controlled with A1c of 6.8. Blood pressure 116/68. Retinopathy stable post-laser surgery. No recent hypoglycemic episodes. Uses Dexcom G7 for glucose monitoring. - Continue Tresiba  22 units daily and Novolog  6-10 units before meals. - Monitor blood pressure at home. Adjust antihypertensive medications if consistently low. - Ensure regular eye exams for retinopathy monitoring.  Dyslipidemia/Atherosclerosis of Aorta LDL cholesterol 111, above target. On atorvastatin  80 mg daily. Family accepts current LDL level. - Continue atorvastatin  80 mg daily.  Dementia with behavioral disturbance (sundowning, non-cooperation) Dementia with sundowning and non-cooperation. No agitation or wandering. Managed with Aricept . - Continue Aricept . - Monitor for behavioral changes that may require Zyprexa or Abilify.  Osteoporosis, on drug holiday after 5 years of bisphosphonate therapy Osteoporosis diagnosed in 2019. On drug holiday after 5 years of Fosamax . Plans to repeat bone density in 2026. High calcium  diet and vitamin D  supplementation continued. - Continue drug holiday from bisphosphonates. - Repeat bone density scan in 2026. - Maintain high calcium  diet and vitamin D  supplementation.  Gait instability and unsteadiness, requires assistive device Gait instability and unsteadiness present. Uses rollator at home. Requests front-wheeled walker. - Provide prescription for a front-wheeled walker.  Heart murmur, stable, not under  active cardiology follow-up Heart murmur evaluated by cardiologist. No further intervention necessary. - family not interested  in follow up with cardiologist at this time

## 2023-10-12 ENCOUNTER — Other Ambulatory Visit: Payer: Self-pay | Admitting: Oncology

## 2023-10-21 DIAGNOSIS — Z961 Presence of intraocular lens: Secondary | ICD-10-CM | POA: Diagnosis not present

## 2023-10-21 DIAGNOSIS — E113553 Type 2 diabetes mellitus with stable proliferative diabetic retinopathy, bilateral: Secondary | ICD-10-CM | POA: Diagnosis not present

## 2023-10-21 LAB — HM DIABETES EYE EXAM

## 2023-12-20 ENCOUNTER — Encounter: Payer: Self-pay | Admitting: Oncology

## 2023-12-20 ENCOUNTER — Inpatient Hospital Stay: Attending: Oncology | Admitting: Oncology

## 2023-12-20 VITALS — BP 120/53 | HR 53 | Temp 97.4°F | Resp 19 | Ht 62.0 in | Wt 130.4 lb

## 2023-12-20 DIAGNOSIS — Z08 Encounter for follow-up examination after completed treatment for malignant neoplasm: Secondary | ICD-10-CM | POA: Diagnosis not present

## 2023-12-20 DIAGNOSIS — Z79811 Long term (current) use of aromatase inhibitors: Secondary | ICD-10-CM | POA: Insufficient documentation

## 2023-12-20 DIAGNOSIS — Z17411 Hormone receptor positive with human epidermal growth factor receptor 2 negative status: Secondary | ICD-10-CM | POA: Diagnosis not present

## 2023-12-20 DIAGNOSIS — Z79899 Other long term (current) drug therapy: Secondary | ICD-10-CM | POA: Diagnosis not present

## 2023-12-20 DIAGNOSIS — Z853 Personal history of malignant neoplasm of breast: Secondary | ICD-10-CM | POA: Diagnosis not present

## 2023-12-20 DIAGNOSIS — Z923 Personal history of irradiation: Secondary | ICD-10-CM | POA: Insufficient documentation

## 2023-12-20 DIAGNOSIS — C50412 Malignant neoplasm of upper-outer quadrant of left female breast: Secondary | ICD-10-CM | POA: Diagnosis not present

## 2023-12-20 NOTE — Progress Notes (Signed)
 Hematology/Oncology Consult note [facility]  [phone] [phone]  Patient Care Team: Sowles, Krichna, MD as PCP - General (Family Medicine) Myrna Adine Anes, MD as Consulting Physician (Ophthalmology) Cherilyn Debby CROME, MD as Consulting Physician (Internal Medicine) Georgina Shasta POUR, RN as Oncology Nurse Navigator   Name of the patient: [name]  [id number]  Dec 19, 1940   Date of visit: 12/20/23  Diagnosis- pathological prognostic stage IIa invasive mammary carcinoma of the left breast pT2 N1a M0 ER/PR positive HER2 negative   Chief complaint/ Reason for visit-routine surveillance visit for breast cancer presently on letrozole   Heme/Onc history: Patient is a 83 year old female who underwent a bilateral diagnostic mammogram for palpable mass in the left breast in January 2024.  Mammogram showed hypoechoic mass measuring 3.1 x 1.9 x 3.4 cm in the left retroareolar breast at 2 o'clock position with invasion into the dermis and retroareolar breast.  2 enlarged left axillary lymph nodes.  No mammographic evidence of malignancy in the right breast.  Patient had a left breast biopsy as well as biopsy of the left axillary lymph which showed invasive mammary carcinoma grade 310 mm in the primary breast specimen and 7 mm in the lymph node.  Tumor was ER greater than 90% positive, PR 60% positive and HER2 negative.   Patient is here with her daughter today who is her healthcare power of attorney.  Patient has advanced dementia and is dependent on her family members for ADLs including dressing and ambulation to some extent.  She is incontinent.  She has not had any recent infections or hospitalizations.   Final left mastectomy pathology from January 2024 pathology showed 3.2 cm grade 3 invasive mammary carcinoma with negative margins.  3 out of 15 lymph nodes were positive for malignancy.  Given her advanced dementia adjuvant chemotherapy was not  offered.   Patient had started letrozole  briefly but was held due to abnormal LFTs which were noticed at the time of her cholecystitis.  She completed adjuvant radiation and restarted letrozole  in May, 2024    Interval history- [name] is an 83 year old female with breast cancer in remission who presents for follow-up.  She has been undergoing treatment for breast cancer for over a year, which included a left mastectomy with removal of positive lymph nodes, followed by radiation therapy.  She experiences no pain, including bone or back pain, and has not noticed any changes in appetite or weight. She is adherent to her letrozole  regimen without missing doses.  History limited due to her underlying dementia  ECOG PS- 3 Pain scale- 0   Review of systems- Review of Systems  Unable to perform ROS: Other   Limited due to underlying dementia   Allergies  Allergen Reactions   Colesevelam Hcl     scotomas   Daucus Carota    Penicillins Itching     Past Medical History:  Diagnosis Date   Arthritis    Atherosclerosis of aorta    CKD (chronic kidney disease) stage 3, GFR 30-59 ml/min (HCC)    Dementia (HCC)    Diabetes mellitus without complication (HCC)    Dysrhythmia    Heart murmur    Hypertension    Type 1 diabetes (HCC)      Past Surgical History:  Procedure Laterality Date   AXILLARY LYMPH NODE BIOPSY Left 04/07/2022   bx done, hydromarker placed, path pending   BREAST BIOPSY Left 04/07/2022   us  bx mass, ribbon marker, path pending  BREAST BIOPSY Left 04/07/2022   US  LT BREAST BX W LOC DEV 1ST LESION IMG BX SPEC US  GUIDE 04/07/2022 [facility]   BREAST EXCISIONAL BIOPSY Left    neg   BREAST SURGERY  1970   EYE SURGERY     detached retina   MASTECTOMY MODIFIED RADICAL Left 05/25/2022   Procedure: MASTECTOMY MODIFIED RADICAL;  Surgeon: Rodolph Romano, MD;  Location: [facility];  Service: General;  Laterality: Left;    Social History    Socioeconomic History   Marital status: Married    Spouse name: Victory   Number of children: 3   Years of education: Not on file   Highest education level: 12th grade  Occupational History   Occupation: Retired  Tobacco Use   Smoking status: Never   Smokeless tobacco: Never   Tobacco comments:    smoking cessation materials not required  Vaping Use   Vaping status: Never Used  Substance and Sexual Activity   Alcohol use: No    Alcohol/week: 0.0 standard drinks of alcohol   Drug use: No   Sexual activity: Not Currently  Other Topics Concern   Not on file  Social History Narrative   Not on file   Social Drivers of Health   Financial Resource Strain: Low Risk  (06/17/2023)   Overall Financial Resource Strain (CARDIA)    Difficulty of Paying Living Expenses: Not hard at all  Food Insecurity: No Food Insecurity (06/17/2023)   Hunger Vital Sign    Worried About Running Out of Food in the Last Year: Never true    Ran Out of Food in the Last Year: Never true  Transportation Needs: No Transportation Needs (06/17/2023)   PRAPARE - Administrator, Civil Service (Medical): No    Lack of Transportation (Non-Medical): No  Physical Activity: Inactive (06/17/2023)   Exercise Vital Sign    Days of Exercise per Week: 0 days    Minutes of Exercise per Session: 0 min  Stress: No Stress Concern Present (06/17/2023)   [employer] of Occupational Health - Occupational Stress Questionnaire    Feeling of Stress : Not at all  Social Connections: Moderately Isolated (06/17/2023)   Social Connection and Isolation Panel    Frequency of Communication with Friends and Family: Twice a week    Frequency of Social Gatherings with Friends and Family: More than three times a week    Attends Religious Services: Never    Database administrator or Organizations: No    Attends Banker Meetings: Never    Marital Status: Married  Catering manager Violence: Not At Risk  (06/17/2023)   Humiliation, Afraid, Rape, and Kick questionnaire    Fear of Current or Ex-Partner: No    Emotionally Abused: No    Physically Abused: No    Sexually Abused: No    Family History  Problem Relation Age of Onset   Alzheimer's disease Mother    Diabetes Mother    Arthritis Mother    Diabetes Father    Hypertension Father    Hyperlipidemia Father    Stroke Brother    Multiple sclerosis Daughter      Current Outpatient Medications:    Accu-Chek Softclix Lancets lancets, , Disp: , Rfl:    amLODipine  (NORVASC ) 5 MG tablet, Take 1 tablet (5 mg total) by mouth at bedtime., Disp: 90 tablet, Rfl: 1   atorvastatin  (LIPITOR) 80 MG tablet, Take 1 tablet (80 mg total) by mouth daily., Disp: 90 tablet, Rfl:  3   BD PEN NEEDLE NANO 2ND GEN 32G X 4 MM MISC, USE 1 EACH 4 (FOUR) TIMES DAILY, Disp: , Rfl:    blood glucose meter kit and supplies, , Disp: , Rfl:    Calcium  Carb-Cholecalciferol  600-10 MG-MCG TABS, Take 1 tablet by mouth 2 (two) times daily., Disp: , Rfl:    Continuous Glucose Sensor (DEXCOM G7 SENSOR) MISC, USE 1 EACH EVERY 10 (TEN) DAYS, Disp: , Rfl:    [employer] ALCOHOL SWABS PADS, USE 4 TIMES DAILY TO WIPE SKIN BEFORE USING INSULIN , Disp: 400 each, Rfl: 2   donepezil  (ARICEPT ) 10 MG tablet, Take 1 tablet (10 mg total) by mouth at bedtime., Disp: 90 tablet, Rfl: 1   Glucagon  (BAQSIMI  ONE PACK) 3 MG/DOSE POWD, , Disp: , Rfl:    insulin  aspart (NOVOLOG ) 100 UNIT/ML FlexPen, Inject 6-10 Units into the skin 3 (three) times daily with meals., Disp: , Rfl:    letrozole  (FEMARA ) 2.5 MG tablet, TAKE 1 TABLET BY MOUTH EVERY DAY, Disp: 90 tablet, Rfl: 1   losartan -hydrochlorothiazide  (HYZAAR) 100-12.5 MG tablet, Take 1 tablet by mouth daily before breakfast., Disp: 90 tablet, Rfl: 1   polyethylene glycol (MIRALAX  / GLYCOLAX ) 17 g packet, Take 17 g by mouth daily., Disp: 14 each, Rfl: 0   TRESIBA  FLEXTOUCH 100 UNIT/ML FlexTouch Pen, Inject 22 Units into the skin daily., Disp: , Rfl:    Physical exam:  Vitals:   12/20/23 1329  BP: (!) 120/53  Pulse: (!) 53  Resp: 19  Temp: (!) 97.4 F (36.3 C)  TempSrc: Tympanic  SpO2: 100%  Weight: 130 lb 6.4 oz (59.1 kg)  Height: 5' 2 (1.575 m)   Physical Exam Cardiovascular:     Rate and Rhythm: Normal rate and regular rhythm.     Heart sounds: Normal heart sounds.  Pulmonary:     Effort: Pulmonary effort is normal.     Breath sounds: Normal breath sounds.  Abdominal:     General: Bowel sounds are normal.     Palpations: Abdomen is soft.  Skin:    General: Skin is warm and dry.  Neurological:     Mental Status: She is alert.      I have personally reviewed labs listed below:    Latest Ref Rng & Units 12/18/2022    1:46 PM  CMP  Glucose 70 - 99 mg/dL 725   BUN 8 - 23 mg/dL 14   Creatinine 9.55 - 1.00 mg/dL 9.28   Sodium 864 - 854 mmol/L 135   Potassium 3.5 - 5.1 mmol/L 4.0   Chloride 98 - 111 mmol/L 102   CO2 22 - 32 mmol/L 26   Calcium  8.9 - 10.3 mg/dL 9.1   Total Protein 6.5 - 8.1 g/dL 7.2   Total Bilirubin 0.3 - 1.2 mg/dL 0.5   Alkaline Phos 38 - 126 U/L 63   AST 15 - 41 U/L 19   ALT 0 - 44 U/L 16       Latest Ref Rng & Units 08/17/2022    1:33 PM  CBC  WBC 4.0 - 10.5 K/uL 4.8   Hemoglobin 12.0 - 15.0 g/dL 87.4   Hematocrit 63.9 - 46.0 % 37.6   Platelets 150 - 400 K/uL 232     Assessment and plan- Patient is a 83 y.o. female with history of stage II ER/PR positive HER2 negative left breast cancer status post mastectomy and adjuvant radiation therapy.  Adjuvant chemotherapy was not offered due to her underlying dementia.  She is currently on letrozole  and this is a routine follow-up visit.  Breast cancer in remission, status post left mastectomy and radiation, on aromatase inhibitor therapy Breast cancer in remission post-mastectomy and radiation. On letrozole  to reduce recurrence risk. No symptoms indicating recurrence. Surveillance based on clinical symptoms, not cancer markers. - Continue  letrozole  therapy. - Monitor for symptoms such as new pain or unintentional weight loss. - No routine mammograms on the right side unless symptoms develop given her underlying dementia her daughter prefers this approach - Do not use cancer markers for surveillance. -I will see her back in 6 months no labs   Visit Diagnosis 1. Encounter for follow-up surveillance of breast cancer   2. Use of letrozole  (Femara )   3. High risk medication use      Dr. Annah Skene, MD, MPH [facility] [phone] 12/20/2023 1:38 PM

## 2023-12-20 NOTE — Progress Notes (Signed)
 Patient doing okay, no new or acute concerns at this time.

## 2023-12-21 ENCOUNTER — Ambulatory Visit: Admitting: Oncology

## 2024-02-08 ENCOUNTER — Ambulatory Visit (INDEPENDENT_AMBULATORY_CARE_PROVIDER_SITE_OTHER)

## 2024-02-08 DIAGNOSIS — Z23 Encounter for immunization: Secondary | ICD-10-CM | POA: Diagnosis not present

## 2024-02-25 ENCOUNTER — Other Ambulatory Visit: Payer: Self-pay | Admitting: Family Medicine

## 2024-02-25 DIAGNOSIS — I700 Atherosclerosis of aorta: Secondary | ICD-10-CM

## 2024-02-29 NOTE — Telephone Encounter (Signed)
 Requested Prescriptions  Pending Prescriptions Disp Refills   atorvastatin  (LIPITOR) 80 MG tablet [Pharmacy Med Name: ATORVASTATIN  80 MG TABLET] 90 tablet 0    Sig: TAKE 1 TABLET BY MOUTH EVERY DAY     Cardiovascular:  Antilipid - Statins Failed - 02/29/2024 10:45 AM      Failed - Lipid Panel in normal range within the last 12 months    Cholesterol, Total  Date Value Ref Range Status  04/10/2015 192 100 - 199 mg/dL Final   Cholesterol  Date Value Ref Range Status  01/27/2022 119 <200 mg/dL Final   LDL Cholesterol (Calc)  Date Value Ref Range Status  01/27/2022 67 mg/dL (calc) Final    Comment:    Reference range: <100 . Desirable range <100 mg/dL for primary prevention;   <70 mg/dL for patients with CHD or diabetic patients  with > or = 2 CHD risk factors. SABRA LDL-C is now calculated using the Martin-Hopkins  calculation, which is a validated novel method providing  better accuracy than the Friedewald equation in the  estimation of LDL-C.  Gladis APPLETHWAITE et al. SANDREA. 7986;689(80): 2061-2068  (http://education.[facility].com/faq/FAQ164)    HDL  Date Value Ref Range Status  01/27/2022 35 (L) > OR = 50 mg/dL Final  97/98/7982 66 >60 mg/dL Final   Triglycerides  Date Value Ref Range Status  01/27/2022 91 <150 mg/dL Final         Passed - Patient is not pregnant      Passed - Valid encounter within last 12 months    Recent Outpatient Visits           5 months ago Hypertension associated with diabetes [facility])   [facility] Glenard Mire, MD       Future Appointments             In 2 weeks Sowles, Krichna, MD [facility], [place]

## 2024-03-15 ENCOUNTER — Encounter: Payer: Self-pay | Admitting: Family Medicine

## 2024-03-15 ENCOUNTER — Ambulatory Visit: Admitting: Family Medicine

## 2024-03-15 VITALS — BP 126/72 | HR 64 | Resp 16 | Ht 62.0 in | Wt 122.7 lb

## 2024-03-15 DIAGNOSIS — I700 Atherosclerosis of aorta: Secondary | ICD-10-CM

## 2024-03-15 DIAGNOSIS — Z9012 Acquired absence of left breast and nipple: Secondary | ICD-10-CM

## 2024-03-15 DIAGNOSIS — Z794 Long term (current) use of insulin: Secondary | ICD-10-CM | POA: Diagnosis not present

## 2024-03-15 DIAGNOSIS — B353 Tinea pedis: Secondary | ICD-10-CM

## 2024-03-15 DIAGNOSIS — I152 Hypertension secondary to endocrine disorders: Secondary | ICD-10-CM

## 2024-03-15 DIAGNOSIS — E1029 Type 1 diabetes mellitus with other diabetic kidney complication: Secondary | ICD-10-CM

## 2024-03-15 DIAGNOSIS — R809 Proteinuria, unspecified: Secondary | ICD-10-CM

## 2024-03-15 DIAGNOSIS — R2681 Unsteadiness on feet: Secondary | ICD-10-CM | POA: Diagnosis not present

## 2024-03-15 DIAGNOSIS — F03918 Unspecified dementia, unspecified severity, with other behavioral disturbance: Secondary | ICD-10-CM

## 2024-03-15 DIAGNOSIS — C50012 Malignant neoplasm of nipple and areola, left female breast: Secondary | ICD-10-CM

## 2024-03-15 DIAGNOSIS — N1831 Chronic kidney disease, stage 3a: Secondary | ICD-10-CM | POA: Diagnosis not present

## 2024-03-15 DIAGNOSIS — M810 Age-related osteoporosis without current pathological fracture: Secondary | ICD-10-CM

## 2024-03-15 DIAGNOSIS — E559 Vitamin D deficiency, unspecified: Secondary | ICD-10-CM | POA: Diagnosis not present

## 2024-03-15 MED ORDER — LOSARTAN POTASSIUM-HCTZ 100-12.5 MG PO TABS: 1.0000 | ORAL_TABLET | Freq: Every day | ORAL | 1 refills | Status: AC

## 2024-03-15 MED ORDER — DONEPEZIL HCL 10 MG PO TABS: 10.0000 mg | ORAL_TABLET | Freq: Every day | ORAL | 1 refills | Status: AC

## 2024-03-15 MED ORDER — TERBINAFINE HCL 1 % EX CREA: 1.0000 | TOPICAL_CREAM | Freq: Two times a day (BID) | CUTANEOUS | 0 refills | Status: AC

## 2024-03-15 MED ORDER — AMLODIPINE BESYLATE 5 MG PO TABS: 5.0000 mg | ORAL_TABLET | Freq: Every evening | ORAL | 1 refills | Status: AC

## 2024-03-15 MED ORDER — ATORVASTATIN CALCIUM 80 MG PO TABS: 80.0000 mg | ORAL_TABLET | Freq: Every day | ORAL | 0 refills | Status: AC

## 2024-03-15 NOTE — Progress Notes (Signed)
 Name: [name]   MRN: [id number]    DOB: Jul 22, 1940   Date:03/15/2024       Progress Note  Subjective  Chief Complaint  Chief Complaint  Patient presents with   Medical Management of Chronic Issues   Discussed the use of AI scribe software for clinical note transcription with the patient, who gave verbal consent to proceed.  History of Present Illness [name] is an 84 year old female with type 1 diabetes, chronic kidney disease, and a history of breast cancer who presents for a follow-up visit.  She manages her type 1 diabetes with a Dexcom device for glucose monitoring and insulin  therapy, including Novolog  and Tresiba  (22 units as basal insulin ), along with Mounjaro. Her blood sugar levels fluctuate, with morning readings around 145 mg/dL, but they rise significantly after breakfast, which typically consists of oatmeal. She experiences postprandial hypoglycemia, with levels sometimes dropping to 80-85 mg/dL, managed with snacks.  She has chronic kidney disease stage 3A and experiences incontinence for both bowel and bladder, managed with pull-ups. She has been experiencing skin itching and has a history of proteinuria.  Her hypertension is managed with amlodipine  5 mg and losartan  HCTZ 100/12.5 mg, with current blood pressure readings at 126/78 mmHg. No chest pain, palpitations, or swelling.  For dyslipidemia, she takes atorvastatin  (Lipitor) without issues. Her cholesterol levels are not currently known.  She has a history of breast cancer, having undergone a unilateral modified radical mastectomy on the left side. She continues on Femara  for suppressive therapy and follows up with her oncologist. The family has decided against further mammograms, opting for clinical exams instead.  She has been diagnosed with dementia with behavioral disturbances, experiencing occasional agitation, particularly in the afternoon (sundowning). She takes Aricept  10 mg for this condition.  She  has been experiencing itching between her toes and is currently using a topical antifungal treatment.     Patient Active Problem List   Diagnosis Date Noted   Constipation 06/30/2022   Biliary colic 06/24/2022   Breast cancer s/p  unilateral modified radical mastectomy, left 05/2022 06/24/2022   Breast cancer (HCC) 05/25/2022   Genetic testing 05/01/2022   Malignant neoplasm of areola of left breast in female, estrogen receptor positive (HCC) 04/14/2022   Gait instability 07/29/2021   Atherosclerosis of aorta 04/01/2016   Hypertension associated with diabetes (HCC) 09/19/2014   Carpal tunnel syndrome 09/19/2014   Detached retina 09/19/2014   Type 1 diabetes mellitus with microalbuminuria (HCC) 09/19/2014   Dyslipidemia 09/19/2014   Hypophosphatemia 09/19/2014   OP (osteoporosis) 09/19/2014   Menopause 09/19/2014   Ptosis of eyelid 09/19/2014   Urge incontinence 08/01/2013   Hyperlipidemia due to type 1 diabetes mellitus (HCC) 08/01/2013   Vitamin D  deficiency 02/24/2007    Past Surgical History:  Procedure Laterality Date   AXILLARY LYMPH NODE BIOPSY Left 04/07/2022   bx done, hydromarker placed, path pending   BREAST BIOPSY Left 04/07/2022   us  bx mass, ribbon marker, path pending   BREAST BIOPSY Left 04/07/2022   US  LT BREAST BX W LOC DEV 1ST LESION IMG BX SPEC US  GUIDE 04/07/2022 [facility]   BREAST EXCISIONAL BIOPSY Left    neg   BREAST SURGERY  1970   EYE SURGERY     detached retina   MASTECTOMY MODIFIED RADICAL Left 05/25/2022   Procedure: MASTECTOMY MODIFIED RADICAL;  Surgeon: Rodolph Romano, MD;  Location: [facility];  Service: General;  Laterality: Left;    Family History  Problem Relation Age of Onset  Alzheimer's disease Mother    Diabetes Mother    Arthritis Mother    Diabetes Father    Hypertension Father    Hyperlipidemia Father    Stroke Brother    Multiple sclerosis Daughter     Social History   Tobacco Use   Smoking status: Never    Smokeless tobacco: Never   Tobacco comments:    smoking cessation materials not required  Substance Use Topics   Alcohol use: No    Alcohol/week: 0.0 standard drinks of alcohol    Current Medications[1]  Allergies[2]  I personally reviewed active problem list, medication list, allergies, family history with the patient/caregiver today.   ROS  Ten systems reviewed and is negative except as mentioned in HPI    Objective Physical Exam  CONSTITUTIONAL: Patient appears well-developed  HEENT: Head atraumatic, normocephalic, neck supple. CARDIOVASCULAR: Normal rate, regular rhythm and normal heart sounds. No murmur heard. No BLE edema. PULMONARY: Effort normal and breath sounds normal. No respiratory distress. Lungs clear to auscultation. ABDOMINAL: There is no tenderness or distention. MUSCULOSKELETAL:walks with support PSYCHIATRIC: did not talk, smiled, history given by daughter and husband  NEUROLOGICAL: Sensation intact in feet.  Vitals:   03/15/24 1332  BP: 126/72  Pulse: 64  Resp: 16  SpO2: 94%  Weight: 122 lb 11.2 oz (55.7 kg)  Height: 5' 2 (1.575 m)    Body mass index is 22.44 kg/m.    Diabetic Foot Exam:  Diabetic foot exam was performed with the following findings:   Normal sensation of 10g monofilament Intact posterior tibialis and dorsalis pedis pulses Maceration on toe webs bilaterally       PHQ2/9:    03/15/2024    1:23 PM 12/20/2023    1:28 PM 09/13/2023    1:43 PM 06/17/2023    1:18 PM 03/12/2023    2:13 PM  Depression screen PHQ 2/9  Decreased Interest 0 0 0 0 0  Down, Depressed, Hopeless 0 0 0 0 0  PHQ - 2 Score 0 0 0 0 0  Altered sleeping    0 0  Tired, decreased energy    0 0  Change in appetite    0 0  Feeling bad or failure about yourself     0 0  Trouble concentrating    0 0  Moving slowly or fidgety/restless    0 0  Suicidal thoughts    0 0  PHQ-9 Score    0  0   Difficult doing work/chores    Not difficult at all Not difficult  at all     Data saved with a previous flowsheet row definition    phq 9 is negative  Fall Risk:    03/15/2024    1:23 PM 09/13/2023    1:43 PM 06/17/2023    1:20 PM 03/12/2023    2:03 PM 10/05/2022    2:33 PM  Fall Risk   Falls in the past year? 0 0 0 0 0  Number falls in past yr: 0 0 0 0 0  Injury with Fall? 0 0  0  0  0   Risk for fall due to : No Fall Risks No Fall Risks No Fall Risks No Fall Risks Impaired balance/gait;Impaired mobility  Follow up Falls evaluation completed Falls evaluation completed Falls prevention discussed;Falls evaluation completed Falls prevention discussed;Education provided;Falls evaluation completed Falls prevention discussed     Data saved with a previous flowsheet row definition      Assessment & Plan  Type 1 diabetes mellitus with microalbuminuria, hypertension, and dyslipidemia Type 1 diabetes with postprandial hypoglycemia likely due to excessive pre-meal insulin . Blood pressure well-controlled. Dyslipidemia managed with atorvastatin . - Checked A1c and will fax results to endocrinologist. - Discuss insulin  regimen adjustment with endocrinologist, considering Omnipod. - Ensure protein intake with meals. - Continue atorvastatin . - Continue amlodipine  and losartan  HCTZ.  Dementia with behavioral disturbance Dementia with behavioral disturbance well-managed with Aricept . Occasional afternoon agitation. - Continue Aricept  10 mg daily.  Stage 3a chronic kidney disease Chronic kidney disease stage 3a. - Continue current management and monitoring.  left breast cancer, status post unilateral modified radical mastectomy, on adjuvant therapy Left breast cancer treated with mastectomy. On Femara  for suppressive therapy. Family declined further mammograms. - Continue Femara . - Follow up with oncologist as needed.  Age-related osteoporosis Osteoporosis management discussed with emphasis on fall prevention. - Ensure fall prevention measures.  Gait  instability Requiring assistance for mobility. - Continue current mobility assistance and monitoring.  Tinea pedis Maceration between toes indicating fungal infection. Risk of secondary bacterial infection. - Prescribed topical antifungal cream twice daily for one week. - Use cold bond powder at night. - Dry feet thoroughly with soft socks after application.  Vitamin D  deficiency - Will recheck vitamin D  levels.  General Health Maintenance Due for tdap vaccination. Discussed importance of vaccination. - Obtain tdap vaccination at pharmacy, verify insurance coverage.        [1]  Current Outpatient Medications:    Accu-Chek Softclix Lancets lancets, , Disp: , Rfl:    BD PEN NEEDLE NANO 2ND GEN 32G X 4 MM MISC, USE 1 EACH 4 (FOUR) TIMES DAILY, Disp: , Rfl:    blood glucose meter kit and supplies, , Disp: , Rfl:    Calcium  Carb-Cholecalciferol  600-10 MG-MCG TABS, Take 1 tablet by mouth 2 (two) times daily., Disp: , Rfl:    Continuous Glucose Sensor (DEXCOM G7 SENSOR) MISC, USE 1 EACH EVERY 10 (TEN) DAYS, Disp: , Rfl:    [employer] ALCOHOL SWABS PADS, USE 4 TIMES DAILY TO WIPE SKIN BEFORE USING INSULIN , Disp: 400 each, Rfl: 2   Glucagon  (BAQSIMI  ONE PACK) 3 MG/DOSE POWD, , Disp: , Rfl:    insulin  aspart (NOVOLOG ) 100 UNIT/ML FlexPen, Inject 6-10 Units into the skin 3 (three) times daily with meals., Disp: , Rfl:    letrozole  (FEMARA ) 2.5 MG tablet, TAKE 1 TABLET BY MOUTH EVERY DAY, Disp: 90 tablet, Rfl: 1   polyethylene glycol (MIRALAX  / GLYCOLAX ) 17 g packet, Take 17 g by mouth daily., Disp: 14 each, Rfl: 0   terbinafine  (LAMISIL  AT) 1 % cream, Apply 1 Application topically 2 (two) times daily., Disp: 30 g, Rfl: 0   TRESIBA  FLEXTOUCH 100 UNIT/ML FlexTouch Pen, Inject 22 Units into the skin daily., Disp: , Rfl:    amLODipine  (NORVASC ) 5 MG tablet, Take 1 tablet (5 mg total) by mouth at bedtime., Disp: 90 tablet, Rfl: 1   atorvastatin  (LIPITOR) 80 MG tablet, Take 1 tablet (80 mg total) by  mouth daily., Disp: 90 tablet, Rfl: 0   donepezil  (ARICEPT ) 10 MG tablet, Take 1 tablet (10 mg total) by mouth at bedtime., Disp: 90 tablet, Rfl: 1   losartan -hydrochlorothiazide  (HYZAAR) 100-12.5 MG tablet, Take 1 tablet by mouth daily before breakfast., Disp: 90 tablet, Rfl: 1 [2]  Allergies Allergen Reactions   Colesevelam Hcl     scotomas   Daucus Carota    Penicillins Itching

## 2024-03-16 ENCOUNTER — Ambulatory Visit: Payer: Self-pay | Admitting: Family Medicine

## 2024-03-16 LAB — COMPREHENSIVE METABOLIC PANEL WITH GFR
AG Ratio: 1.3 (calc) (ref 1.0–2.5)
ALT: 18 U/L (ref 6–29)
AST: 22 U/L (ref 10–35)
Albumin: 3.9 g/dL (ref 3.6–5.1)
Alkaline phosphatase (APISO): 83 U/L (ref 37–153)
BUN: 15 mg/dL (ref 7–25)
CO2: 28 mmol/L (ref 20–32)
Calcium: 9.4 mg/dL (ref 8.6–10.4)
Chloride: 102 mmol/L (ref 98–110)
Creat: 0.69 mg/dL (ref 0.60–0.95)
Globulin: 3.1 g/dL (ref 1.9–3.7)
Glucose, Bld: 250 mg/dL — ABNORMAL HIGH (ref 65–99)
Potassium: 4 mmol/L (ref 3.5–5.3)
Sodium: 138 mmol/L (ref 135–146)
Total Bilirubin: 0.7 mg/dL (ref 0.2–1.2)
Total Protein: 7 g/dL (ref 6.1–8.1)
eGFR: 87 mL/min/1.73m2

## 2024-03-16 LAB — CBC WITH DIFFERENTIAL/PLATELET
Absolute Lymphocytes: 2484 {cells}/uL (ref 850–3900)
Absolute Monocytes: 598 {cells}/uL (ref 200–950)
Basophils Absolute: 43 {cells}/uL (ref 0–200)
Basophils Relative: 0.6 %
Eosinophils Absolute: 86 {cells}/uL (ref 15–500)
Eosinophils Relative: 1.2 %
HCT: 36.7 % (ref 35.9–46.0)
Hemoglobin: 12 g/dL (ref 11.7–15.5)
MCH: 30.2 pg (ref 27.0–33.0)
MCHC: 32.7 g/dL (ref 31.6–35.4)
MCV: 92.2 fL (ref 81.4–101.7)
MPV: 11.9 fL (ref 7.5–12.5)
Monocytes Relative: 8.3 %
Neutro Abs: 3989 {cells}/uL (ref 1500–7800)
Neutrophils Relative %: 55.4 %
Platelets: 166 Thousand/uL (ref 140–400)
RBC: 3.98 Million/uL (ref 3.80–5.10)
RDW: 13.1 % (ref 11.0–15.0)
Total Lymphocyte: 34.5 %
WBC: 7.2 Thousand/uL (ref 3.8–10.8)

## 2024-03-16 LAB — LIPID PANEL
Cholesterol: 101 mg/dL
HDL: 31 mg/dL — ABNORMAL LOW
LDL Cholesterol (Calc): 53 mg/dL
Non-HDL Cholesterol (Calc): 70 mg/dL
Total CHOL/HDL Ratio: 3.3 (calc)
Triglycerides: 88 mg/dL

## 2024-03-16 LAB — MICROALBUMIN / CREATININE URINE RATIO
Creatinine, Urine: 85 mg/dL (ref 20–275)
Microalb Creat Ratio: 11 mg/g{creat}
Microalb, Ur: 0.9 mg/dL

## 2024-03-16 LAB — HEMOGLOBIN A1C
Hgb A1c MFr Bld: 8.1 % — ABNORMAL HIGH
Mean Plasma Glucose: 186 mg/dL
eAG (mmol/L): 10.3 mmol/L

## 2024-03-16 LAB — TSH: TSH: 0.98 m[IU]/L (ref 0.40–4.50)

## 2024-03-16 LAB — VITAMIN D 25 HYDROXY (VIT D DEFICIENCY, FRACTURES): Vit D, 25-Hydroxy: 52 ng/mL (ref 30–100)

## 2024-06-19 ENCOUNTER — Inpatient Hospital Stay: Admitting: Oncology

## 2024-06-22 ENCOUNTER — Ambulatory Visit

## 2024-09-13 ENCOUNTER — Ambulatory Visit: Admitting: Family Medicine
